# Patient Record
Sex: Female | Born: 1940
Health system: Southern US, Community
[De-identification: ages and names within clinical notes are randomized; demographics above are authoritative.]

## PROBLEM LIST (undated history)

## (undated) DIAGNOSIS — I493 Ventricular premature depolarization: Secondary | ICD-10-CM

## (undated) DIAGNOSIS — K219 Gastro-esophageal reflux disease without esophagitis: Secondary | ICD-10-CM

## (undated) DIAGNOSIS — R0789 Other chest pain: Secondary | ICD-10-CM

## (undated) DIAGNOSIS — T8859XA Other complications of anesthesia, initial encounter: Secondary | ICD-10-CM

## (undated) DIAGNOSIS — R55 Syncope and collapse: Secondary | ICD-10-CM

## (undated) DIAGNOSIS — R7303 Prediabetes: Secondary | ICD-10-CM

## (undated) DIAGNOSIS — E559 Vitamin D deficiency, unspecified: Secondary | ICD-10-CM

## (undated) DIAGNOSIS — Z7982 Long term (current) use of aspirin: Secondary | ICD-10-CM

## (undated) DIAGNOSIS — I700 Atherosclerosis of aorta: Secondary | ICD-10-CM

## (undated) DIAGNOSIS — I10 Essential (primary) hypertension: Secondary | ICD-10-CM

## (undated) DIAGNOSIS — E785 Hyperlipidemia, unspecified: Secondary | ICD-10-CM

## (undated) DIAGNOSIS — G4700 Insomnia, unspecified: Secondary | ICD-10-CM

## (undated) DIAGNOSIS — E039 Hypothyroidism, unspecified: Secondary | ICD-10-CM

## (undated) DIAGNOSIS — R300 Dysuria: Secondary | ICD-10-CM

## (undated) DIAGNOSIS — M1990 Unspecified osteoarthritis, unspecified site: Secondary | ICD-10-CM

## (undated) DIAGNOSIS — G629 Polyneuropathy, unspecified: Secondary | ICD-10-CM

## (undated) DIAGNOSIS — I7300 Raynaud's syndrome without gangrene: Secondary | ICD-10-CM

## (undated) DIAGNOSIS — M170 Bilateral primary osteoarthritis of knee: Secondary | ICD-10-CM

## (undated) HISTORY — DX: Ventricular premature depolarization: I49.3

## (undated) HISTORY — DX: Hypothyroidism, unspecified: E03.9

## (undated) HISTORY — DX: Dysuria: R30.0

## (undated) HISTORY — PX: ABDOMINAL HYSTERECTOMY: SHX81

## (undated) HISTORY — PX: CATARACT EXTRACTION, BILATERAL: SHX1313

## (undated) HISTORY — DX: Hyperlipidemia, unspecified: E78.5

---

## 1998-05-19 ENCOUNTER — Ambulatory Visit (HOSPITAL_COMMUNITY): Admission: RE | Admit: 1998-05-19 | Discharge: 1998-05-19 | Payer: Self-pay | Admitting: Neurology

## 2005-01-12 ENCOUNTER — Ambulatory Visit: Payer: Self-pay | Admitting: Family Medicine

## 2005-02-01 ENCOUNTER — Ambulatory Visit: Payer: Self-pay | Admitting: Family Medicine

## 2005-08-27 ENCOUNTER — Ambulatory Visit: Payer: Self-pay | Admitting: Family Medicine

## 2006-06-03 ENCOUNTER — Ambulatory Visit: Payer: Self-pay | Admitting: Family Medicine

## 2006-06-28 ENCOUNTER — Ambulatory Visit: Payer: Self-pay | Admitting: General Surgery

## 2007-10-17 ENCOUNTER — Ambulatory Visit: Payer: Self-pay | Admitting: Family Medicine

## 2008-10-21 ENCOUNTER — Ambulatory Visit: Payer: Self-pay | Admitting: Family Medicine

## 2009-10-27 ENCOUNTER — Ambulatory Visit: Payer: Self-pay | Admitting: Internal Medicine

## 2009-11-25 ENCOUNTER — Ambulatory Visit: Payer: Self-pay | Admitting: Internal Medicine

## 2009-12-25 ENCOUNTER — Other Ambulatory Visit: Payer: Self-pay | Admitting: Internal Medicine

## 2009-12-30 ENCOUNTER — Ambulatory Visit: Payer: Self-pay | Admitting: Internal Medicine

## 2010-03-16 ENCOUNTER — Ambulatory Visit: Payer: Self-pay | Admitting: Gastroenterology

## 2010-10-28 ENCOUNTER — Ambulatory Visit: Payer: Self-pay | Admitting: Internal Medicine

## 2010-10-28 LAB — HM DEXA SCAN: HM Dexa Scan: NORMAL

## 2011-04-13 LAB — HM COLONOSCOPY: HM Colonoscopy: NORMAL

## 2011-07-27 ENCOUNTER — Ambulatory Visit: Payer: Self-pay

## 2011-10-23 LAB — HM MAMMOGRAPHY: HM Mammogram: NORMAL

## 2011-11-03 ENCOUNTER — Ambulatory Visit: Payer: Self-pay | Admitting: Internal Medicine

## 2011-11-05 ENCOUNTER — Encounter: Payer: Self-pay | Admitting: Internal Medicine

## 2011-11-09 ENCOUNTER — Encounter: Payer: Self-pay | Admitting: Internal Medicine

## 2011-11-10 ENCOUNTER — Telehealth: Payer: Self-pay | Admitting: *Deleted

## 2011-11-10 NOTE — Telephone Encounter (Signed)
OK to fill

## 2011-11-10 NOTE — Telephone Encounter (Signed)
Pharm faxed RF request -  VIT D 50,000 IU once a month. Next OV is 12/26

## 2011-11-11 MED ORDER — ERGOCALCIFEROL 1.25 MG (50000 UT) PO CAPS: 50000.0000 [IU] | ORAL_CAPSULE | ORAL | Status: DC

## 2011-11-17 ENCOUNTER — Encounter: Payer: Self-pay | Admitting: Internal Medicine

## 2011-11-17 ENCOUNTER — Ambulatory Visit (INDEPENDENT_AMBULATORY_CARE_PROVIDER_SITE_OTHER): Payer: Medicare Other | Admitting: Internal Medicine

## 2011-11-17 DIAGNOSIS — Z0000 Encounter for general adult medical examination without abnormal findings: Secondary | ICD-10-CM

## 2011-11-17 DIAGNOSIS — R040 Epistaxis: Secondary | ICD-10-CM

## 2011-11-17 DIAGNOSIS — E7849 Other hyperlipidemia: Secondary | ICD-10-CM | POA: Insufficient documentation

## 2011-11-17 DIAGNOSIS — E039 Hypothyroidism, unspecified: Secondary | ICD-10-CM

## 2011-11-17 DIAGNOSIS — R002 Palpitations: Secondary | ICD-10-CM

## 2011-11-17 DIAGNOSIS — E785 Hyperlipidemia, unspecified: Secondary | ICD-10-CM

## 2011-11-17 NOTE — Progress Notes (Signed)
Subjective:     Patient ID: [name], female   DOB: 1941-02-25, 70 y.o.   MRN: [id number]  HPI The patient is here for annual Medicare wellness examination and management of other chronic and acute problems.   The risk factors are reflected in the social history.  The roster of all physicians providing medical care to patient - is listed in the Snapshot section of the chart.  Activities of daily living:  The patient is 100% independent in all ADLs: dressing, toileting, feeding as well as independent mobility  Home safety : The patient has smoke detectors in the home. They wear seatbelts. No firearms at home. There is no violence in the home.   There is no risks for hepatitis, STDs or HIV. There is no history of blood transfusion. They have no travel history to infectious disease endemic areas of the world.  The patient has seen their dentist in the last six month. They have seen their eye doctor in the last year. They deny any hearing difficulty and have not had audiologic testing in the last year.  They do not  have excessive sun exposure. Discussed the need for sun protection: hats, long sleeves and use of sunscreen if there is significant sun exposure.   Diet: the importance of a healthy diet is discussed. They do have a healthy diet.  The patient has a regular exercise program: Aerobic classes at [employer] , at least an hour duration, _5_per week.  The benefits of regular aerobic exercise were discussed.  Depression screen: there are no signs or vegative symptoms of depression- irritability, change in appetite, anhedonia, sadness/tearfullness.  Cognitive assessment: the patient manages all their financial and personal affairs and is actively engaged. They could relate day,date,year and events; recalled 3/3 objects at 3 minutes; performed clock-face test normally.  The following portions of the patient's history were reviewed and updated as appropriate: allergies, current medications,  past family history, past medical history,  past surgical history, past social history  and problem list.  Vision, hearing, body mass index were assessed and reviewed.   During the course of the visit the patient was educated and counseled about appropriate screening and preventive services including : fall prevention , diabetes screening, nutrition counseling, colorectal cancer screening, and recommended immunizations.   Patient is also concerned today about recent episodes of palpitations. She describes these as short rapid heartbeats which occur most often at night. The episodes of rapid heartbeat typically last for less than a minute. They are not associated with chest pain, diaphoresis, shortness of breath, nausea, or other symptoms. She has been evaluated for palpitations in the distant past with a Holter monitor which she reports was normal. This was performed approximately 30 years ago. She has not taken any medication for these palpitations. She does note recent increase in anxiety and stress level with caring for her husband who had a stroke and her sister who was recently hospitalized after surgical procedure.  She also notes recent episodes of nosebleeds. She reports that these occurred on approximately 4 occasions. She was unable to stop the bleeding by applying pressure and ultimately went to an ENT physician to have the bleeding cauterized. She reports no further bleeding after cauterization.  Review of Systems  Constitutional: Negative for fever, chills, appetite change, fatigue and unexpected weight change.  HENT: Negative for ear pain, congestion, sore throat, trouble swallowing, neck pain, voice change and sinus pressure.   Eyes: Negative for visual disturbance.  Respiratory: Negative for  cough, shortness of breath, wheezing and stridor.   Cardiovascular: Positive for palpitations. Negative for chest pain and leg swelling.  Gastrointestinal: Negative for nausea, vomiting, abdominal  pain, diarrhea, constipation, blood in stool, abdominal distention and anal bleeding.  Genitourinary: Negative for dysuria and flank pain.  Musculoskeletal: Negative for myalgias, arthralgias and gait problem.  Skin: Negative for color change and rash.  Neurological: Negative for dizziness and headaches.  Hematological: Negative for adenopathy. Does not bruise/bleed easily.  Psychiatric/Behavioral: Negative for suicidal ideas, sleep disturbance and dysphoric mood. The patient is not nervous/anxious.    BP 118/70  Pulse 60  Temp(Src) 97.9 F (36.6 C) (Oral)  Ht 5\' 8"  (1.727 m)  Wt 146 lb (66.225 kg)  BMI 22.20 kg/m2  SpO2 97%      Objective:   Physical Exam  Constitutional: She is oriented to person, place, and time. She appears well-developed and well-nourished. No distress.  HENT:  Head: Normocephalic and atraumatic.  Right Ear: External ear normal.  Left Ear: External ear normal.  Nose: Nose normal.  Mouth/Throat: Oropharynx is clear and moist. No oropharyngeal exudate.  Eyes: Conjunctivae are normal. Pupils are equal, round, and reactive to light. Right eye exhibits no discharge. Left eye exhibits no discharge. No scleral icterus.  Neck: Normal range of motion. Neck supple. No tracheal deviation present. No thyromegaly present.  Cardiovascular: Normal rate, regular rhythm, normal heart sounds and intact distal pulses.  Exam reveals no gallop and no friction rub.   No murmur heard. Pulmonary/Chest: Effort normal and breath sounds normal. No respiratory distress. She has no wheezes. She has no rales. She exhibits no tenderness.  Abdominal: Soft. Bowel sounds are normal. She exhibits no distension and no mass. There is no tenderness. There is no rebound and no guarding.  Musculoskeletal: Normal range of motion. She exhibits no edema and no tenderness.  Lymphadenopathy:    She has no cervical adenopathy.  Neurological: She is alert and oriented to person, place, and time. No  cranial nerve deficit. She exhibits normal muscle tone. Coordination normal.  Skin: Skin is warm and dry. No rash noted. She is not diaphoretic. No erythema. No pallor.  Psychiatric: She has a normal mood and affect. Her behavior is normal. Judgment and thought content normal.       Assessment/Plan:  1. General medical exam - Health maintenance is UTD including mammogram, colonoscopy, and vaccinations. Will check basic labs including CBC, CMP, lipids.  2. Palpitations - Exam is normal today. EKG is normal. Suspect these episodes are related to recent increase in anxiety, however will check electrolytes and TSH with labs. Will also set up for repeat cardiology evaluation with holter monitor.  3. Epistaxis - Resolved after cauterization. Will check CBC with labs today.  4. Hyperlipidemia - Will check lipids and LFTs with labs.

## 2011-11-24 ENCOUNTER — Other Ambulatory Visit (INDEPENDENT_AMBULATORY_CARE_PROVIDER_SITE_OTHER): Payer: Medicare Other | Admitting: *Deleted

## 2011-11-24 DIAGNOSIS — E785 Hyperlipidemia, unspecified: Secondary | ICD-10-CM

## 2011-11-24 DIAGNOSIS — E039 Hypothyroidism, unspecified: Secondary | ICD-10-CM

## 2011-11-24 DIAGNOSIS — R040 Epistaxis: Secondary | ICD-10-CM

## 2011-11-24 LAB — CBC WITH DIFFERENTIAL/PLATELET
Basophils Absolute: 0 10*3/uL (ref 0.0–0.1)
Basophils Relative: 0.8 % (ref 0.0–3.0)
Eosinophils Absolute: 0.4 10*3/uL (ref 0.0–0.7)
Eosinophils Relative: 8.4 % — ABNORMAL HIGH (ref 0.0–5.0)
HCT: 40.9 % (ref 36.0–46.0)
Hemoglobin: 13.7 g/dL (ref 12.0–15.0)
Lymphocytes Relative: 25.5 % (ref 12.0–46.0)
Lymphs Abs: 1.1 10*3/uL (ref 0.7–4.0)
MCHC: 33.5 g/dL (ref 30.0–36.0)
MCV: 92.9 fl (ref 78.0–100.0)
Monocytes Absolute: 0.4 10*3/uL (ref 0.1–1.0)
Monocytes Relative: 8.1 % (ref 3.0–12.0)
Neutro Abs: 2.5 10*3/uL (ref 1.4–7.7)
Neutrophils Relative %: 57.2 % (ref 43.0–77.0)
Platelets: 234 10*3/uL (ref 150.0–400.0)
RBC: 4.4 Mil/uL (ref 3.87–5.11)
RDW: 13.2 % (ref 11.5–14.6)
WBC: 4.3 10*3/uL — ABNORMAL LOW (ref 4.5–10.5)

## 2011-11-24 LAB — COMPREHENSIVE METABOLIC PANEL
ALT: 23 U/L (ref 0–35)
AST: 31 U/L (ref 0–37)
Albumin: 4.2 g/dL (ref 3.5–5.2)
Alkaline Phosphatase: 43 U/L (ref 39–117)
BUN: 10 mg/dL (ref 6–23)
CO2: 30 mEq/L (ref 19–32)
Calcium: 9.3 mg/dL (ref 8.4–10.5)
Chloride: 108 mEq/L (ref 96–112)
Creatinine, Ser: 0.8 mg/dL (ref 0.4–1.2)
GFR: 81.08 mL/min (ref >60.00)
Glucose, Bld: 82 mg/dL (ref 70–99)
Potassium: 4.3 mEq/L (ref 3.5–5.1)
Sodium: 143 mEq/L (ref 135–145)
Total Bilirubin: 0.5 mg/dL (ref 0.3–1.2)
Total Protein: 7.5 g/dL (ref 6.0–8.3)

## 2011-11-24 LAB — LIPID PANEL
Cholesterol: 181 mg/dL (ref 0–200)
HDL: 56.3 mg/dL (ref >39.00)
LDL Cholesterol: 102 mg/dL — ABNORMAL HIGH (ref 0–99)
Total CHOL/HDL Ratio: 3
Triglycerides: 116 mg/dL (ref 0.0–149.0)
VLDL: 23.2 mg/dL (ref 0.0–40.0)

## 2011-11-24 LAB — TSH: TSH: 2.4 u[IU]/mL (ref 0.35–5.50)

## 2011-11-25 ENCOUNTER — Other Ambulatory Visit: Payer: Self-pay | Admitting: *Deleted

## 2011-11-25 MED ORDER — ERGOCALCIFEROL 1.25 MG (50000 UT) PO CAPS: 50000.0000 [IU] | ORAL_CAPSULE | ORAL | Status: AC

## 2011-12-06 ENCOUNTER — Encounter: Payer: Self-pay | Admitting: Internal Medicine

## 2011-12-06 ENCOUNTER — Ambulatory Visit (INDEPENDENT_AMBULATORY_CARE_PROVIDER_SITE_OTHER): Payer: Medicare Other | Admitting: Internal Medicine

## 2011-12-06 VITALS — BP 138/70 | HR 64 | Temp 98.2°F | Wt 144.0 lb

## 2011-12-06 DIAGNOSIS — D72819 Decreased white blood cell count, unspecified: Secondary | ICD-10-CM

## 2011-12-06 LAB — CBC WITH DIFFERENTIAL/PLATELET
Basophils Absolute: 0.1 10*3/uL (ref 0.0–0.1)
Basophils Relative: 1.2 % (ref 0.0–3.0)
Eosinophils Absolute: 0.2 10*3/uL (ref 0.0–0.7)
Eosinophils Relative: 5.6 % — ABNORMAL HIGH (ref 0.0–5.0)
HCT: 37.6 % (ref 36.0–46.0)
Hemoglobin: 12.7 g/dL (ref 12.0–15.0)
Lymphocytes Relative: 30.3 % (ref 12.0–46.0)
Lymphs Abs: 1.2 10*3/uL (ref 0.7–4.0)
MCHC: 33.7 g/dL (ref 30.0–36.0)
MCV: 92.2 fl (ref 78.0–100.0)
Monocytes Absolute: 0.4 10*3/uL (ref 0.1–1.0)
Monocytes Relative: 10.8 % (ref 3.0–12.0)
Neutro Abs: 2.1 10*3/uL (ref 1.4–7.7)
Neutrophils Relative %: 52.1 % (ref 43.0–77.0)
Platelets: 212 10*3/uL (ref 150.0–400.0)
RBC: 4.08 Mil/uL (ref 3.87–5.11)
RDW: 13.2 % (ref 11.5–14.6)
WBC: 4.1 10*3/uL — ABNORMAL LOW (ref 4.5–10.5)

## 2011-12-06 LAB — VITAMIN B12: Vitamin B-12: 905 pg/mL (ref 211–911)

## 2011-12-06 NOTE — Progress Notes (Signed)
Subjective:    Patient ID: [name], female    DOB: Oct 06, 1941, 71 y.o.   MRN: [id number]  HPI 71 year old female presents for followup after recent episode of palpitations. She notes that her palpitations resolved without any intervention. She has not had any palpitations since her last visit. She has not had any chest pain, diaphoresis, shortness of breath, or other complaints. She reports that she's been feeling well. She continues to have some increased anxiety associated with caring for her husband who recently had a stroke.  Outpatient Encounter Prescriptions as of 12/06/2011  Medication Sig Dispense Refill  . aspirin EC 81 MG tablet Take 81 mg by mouth daily.        . Calcium Carbonate-Vit D-Min (CALCIUM 1200 PO) Take by mouth daily.        . ergocalciferol (VITAMIN D2) 50000 UNITS capsule Take 1 capsule (50,000 Units total) by mouth every 30 (thirty) days.  3 capsule  1  . fish oil-omega-3 fatty acids 1000 MG capsule Take 1 g by mouth daily.        . Glucosamine HCl 1000 MG TABS Take by mouth daily.        Marland Kitchen levothyroxine (SYNTHROID, LEVOTHROID) 50 MCG tablet Take 50 mcg by mouth daily.        . Multiple Vitamins-Minerals (MULTIVITAMIN WITH MINERALS) tablet Take 1 tablet by mouth daily.        . niacin (NIASPAN) 1000 MG CR tablet Take 1,000 mg by mouth at bedtime.        . simvastatin (ZOCOR) 20 MG tablet Take 20 mg by mouth at bedtime.          Review of Systems  Constitutional: Negative for fever, chills, appetite change, fatigue and unexpected weight change.  HENT: Negative for ear pain, congestion, sore throat, trouble swallowing, neck pain, voice change and sinus pressure.   Eyes: Negative for visual disturbance.  Respiratory: Negative for cough, shortness of breath, wheezing and stridor.   Cardiovascular: Negative for chest pain, palpitations and leg swelling.  Gastrointestinal: Negative for nausea, vomiting, abdominal pain, diarrhea, constipation, blood in stool,  abdominal distention and anal bleeding.  Genitourinary: Negative for dysuria and flank pain.  Musculoskeletal: Negative for myalgias, arthralgias and gait problem.  Skin: Negative for color change and rash.  Neurological: Negative for dizziness and headaches.  Hematological: Negative for adenopathy. Does not bruise/bleed easily.  Psychiatric/Behavioral: Negative for suicidal ideas, sleep disturbance and dysphoric mood. The patient is not nervous/anxious.    BP 138/70  Pulse 64  Temp(Src) 98.2 F (36.8 C) (Oral)  Wt 144 lb (65.318 kg)  SpO2 96%     Objective:   Physical Exam  Constitutional: She is oriented to person, place, and time. She appears well-developed and well-nourished. No distress.  HENT:  Head: Normocephalic and atraumatic.  Right Ear: External ear normal.  Left Ear: External ear normal.  Nose: Nose normal.  Mouth/Throat: Oropharynx is clear and moist. No oropharyngeal exudate.  Eyes: Conjunctivae are normal. Pupils are equal, round, and reactive to light. Right eye exhibits no discharge. Left eye exhibits no discharge. No scleral icterus.  Neck: Normal range of motion. Neck supple. No tracheal deviation present. No thyromegaly present.  Cardiovascular: Normal rate, regular rhythm, normal heart sounds and intact distal pulses.  Exam reveals no gallop and no friction rub.   No murmur heard. Pulmonary/Chest: Effort normal and breath sounds normal. No respiratory distress. She has no wheezes. She has no rales. She exhibits no tenderness.  Musculoskeletal: Normal range of motion. She exhibits no edema and no tenderness.  Lymphadenopathy:    She has no cervical adenopathy.  Neurological: She is alert and oriented to person, place, and time. No cranial nerve deficit. She exhibits normal muscle tone. Coordination normal.  Skin: Skin is warm and dry. No rash noted. She is not diaphoretic. No erythema. No pallor.  Psychiatric: She has a normal mood and affect. Her behavior is  normal. Judgment and thought content normal.          Assessment & Plan:  1. Palpitations - Resolved without intervention.  Suspect related to increased anxiety recently with husband's illness. Will continue to monitor.  2. Leukopenia - Mild WBC 4.7 with eosinophilia. Asymptomatic. Suspect related to previous viral infection. Will repeat CBC today.

## 2011-12-08 LAB — HM COLONOSCOPY: HM Colonoscopy: NORMAL

## 2011-12-28 ENCOUNTER — Other Ambulatory Visit: Payer: Medicare Other

## 2011-12-29 ENCOUNTER — Telehealth: Payer: Self-pay | Admitting: *Deleted

## 2011-12-29 ENCOUNTER — Other Ambulatory Visit: Payer: Self-pay | Admitting: *Deleted

## 2011-12-29 MED ORDER — SIMVASTATIN 20 MG PO TABS: 20.0000 mg | ORAL_TABLET | Freq: Every day | ORAL | Status: DC

## 2011-12-29 NOTE — Telephone Encounter (Signed)
See result note, unable to contact. Letter mailed

## 2011-12-29 NOTE — Telephone Encounter (Signed)
Message copied by Vernie Murders on Wed Dec 29, 2011  3:19 PM ------      Message from: Ronna Polio A      Created: Mon Dec 06, 2011  3:38 PM       Labs show that WBC continues to be low at 4.1.  I still think this may be secondary to viral infection.  I would like to just repeat CBC in 3 months.

## 2012-02-01 ENCOUNTER — Encounter: Payer: Self-pay | Admitting: Internal Medicine

## 2012-03-01 ENCOUNTER — Other Ambulatory Visit (INDEPENDENT_AMBULATORY_CARE_PROVIDER_SITE_OTHER): Payer: Medicare Other | Admitting: *Deleted

## 2012-03-01 DIAGNOSIS — D72819 Decreased white blood cell count, unspecified: Secondary | ICD-10-CM

## 2012-03-02 LAB — CBC WITH DIFFERENTIAL/PLATELET
Basophils Absolute: 0 10*3/uL (ref 0.0–0.1)
Basophils Relative: 0.4 % (ref 0.0–3.0)
Eosinophils Absolute: 0.3 10*3/uL (ref 0.0–0.7)
Eosinophils Relative: 4.3 % (ref 0.0–5.0)
HCT: 40 % (ref 36.0–46.0)
Hemoglobin: 12.9 g/dL (ref 12.0–15.0)
Lymphocytes Relative: 18.8 % (ref 12.0–46.0)
Lymphs Abs: 1.1 10*3/uL (ref 0.7–4.0)
MCHC: 32.3 g/dL (ref 30.0–36.0)
MCV: 96.6 fl (ref 78.0–100.0)
Monocytes Absolute: 0.5 10*3/uL (ref 0.1–1.0)
Monocytes Relative: 7.5 % (ref 3.0–12.0)
Neutro Abs: 4.2 10*3/uL (ref 1.4–7.7)
Neutrophils Relative %: 69 % (ref 43.0–77.0)
Platelets: 128 10*3/uL — ABNORMAL LOW (ref 150.0–400.0)
RBC: 4.14 Mil/uL (ref 3.87–5.11)
RDW: 13.4 % (ref 11.5–14.6)
WBC: 6.1 10*3/uL (ref 4.5–10.5)

## 2012-03-03 ENCOUNTER — Other Ambulatory Visit: Payer: Self-pay | Admitting: Internal Medicine

## 2012-03-03 MED ORDER — LEVOTHYROXINE SODIUM 50 MCG PO TABS: 50.0000 ug | ORAL_TABLET | Freq: Every day | ORAL | Status: DC

## 2012-03-06 ENCOUNTER — Other Ambulatory Visit (INDEPENDENT_AMBULATORY_CARE_PROVIDER_SITE_OTHER): Payer: Medicare Other | Admitting: *Deleted

## 2012-03-06 DIAGNOSIS — D729 Disorder of white blood cells, unspecified: Secondary | ICD-10-CM

## 2012-03-06 LAB — CBC WITH DIFFERENTIAL/PLATELET
Basophils Absolute: 0 10*3/uL (ref 0.0–0.1)
Basophils Relative: 0.9 % (ref 0.0–3.0)
Eosinophils Absolute: 0.2 10*3/uL (ref 0.0–0.7)
Eosinophils Relative: 4.7 % (ref 0.0–5.0)
HCT: 37.1 % (ref 36.0–46.0)
Hemoglobin: 12.3 g/dL (ref 12.0–15.0)
Lymphocytes Relative: 25.5 % (ref 12.0–46.0)
Lymphs Abs: 1.2 10*3/uL (ref 0.7–4.0)
MCHC: 33.2 g/dL (ref 30.0–36.0)
MCV: 94.6 fl (ref 78.0–100.0)
Monocytes Absolute: 0.4 10*3/uL (ref 0.1–1.0)
Monocytes Relative: 7.9 % (ref 3.0–12.0)
Neutro Abs: 2.8 10*3/uL (ref 1.4–7.7)
Neutrophils Relative %: 61 % (ref 43.0–77.0)
Platelets: 206 10*3/uL (ref 150.0–400.0)
RBC: 3.93 Mil/uL (ref 3.87–5.11)
RDW: 12.9 % (ref 11.5–14.6)
WBC: 4.5 10*3/uL (ref 4.5–10.5)

## 2012-03-13 ENCOUNTER — Other Ambulatory Visit: Payer: Self-pay | Admitting: *Deleted

## 2012-03-13 MED ORDER — NIACIN ER (ANTIHYPERLIPIDEMIC) 1000 MG PO TBCR: EXTENDED_RELEASE_TABLET | ORAL | Status: DC

## 2012-03-20 ENCOUNTER — Other Ambulatory Visit: Payer: Self-pay | Admitting: *Deleted

## 2012-03-20 MED ORDER — NIACIN ER 500 MG PO CPCR: 1000.0000 mg | ORAL_CAPSULE | Freq: Every day | ORAL | Status: DC

## 2012-03-20 MED ORDER — SIMVASTATIN 20 MG PO TABS: 20.0000 mg | ORAL_TABLET | Freq: Every day | ORAL | Status: DC

## 2012-03-20 NOTE — Telephone Encounter (Signed)
Per patient request/SLS 

## 2012-05-22 ENCOUNTER — Other Ambulatory Visit: Payer: Self-pay | Admitting: Internal Medicine

## 2012-05-30 ENCOUNTER — Telehealth: Payer: Self-pay | Admitting: Internal Medicine

## 2012-05-30 NOTE — Telephone Encounter (Signed)
Fine to wait until Dec

## 2012-05-30 NOTE — Telephone Encounter (Signed)
Left message on machine at home advising patient as instructed. 

## 2012-05-30 NOTE — Telephone Encounter (Signed)
Patient has to cancel next week's appt since she has to take her husband to the doctor (he has cancer). Patient said she is doing well & wants to know if she needs to make an OV now or can she save the money and schedule a CPE for December. She will be home this afternoon for CB.

## 2012-06-06 ENCOUNTER — Ambulatory Visit: Payer: Medicare Other | Admitting: Internal Medicine

## 2012-06-08 ENCOUNTER — Other Ambulatory Visit: Payer: Self-pay | Admitting: Internal Medicine

## 2012-06-09 NOTE — Telephone Encounter (Signed)
Need to recheck Vit D level before refilling Rx.

## 2012-08-04 ENCOUNTER — Encounter: Payer: Self-pay | Admitting: Internal Medicine

## 2012-08-04 ENCOUNTER — Ambulatory Visit (INDEPENDENT_AMBULATORY_CARE_PROVIDER_SITE_OTHER): Payer: Medicare Other | Admitting: Internal Medicine

## 2012-08-04 VITALS — BP 122/70 | HR 74 | Temp 98.5°F | Ht 68.0 in | Wt 148.0 lb

## 2012-08-04 DIAGNOSIS — Z23 Encounter for immunization: Secondary | ICD-10-CM

## 2012-08-04 DIAGNOSIS — N8110 Cystocele, unspecified: Secondary | ICD-10-CM

## 2012-08-04 DIAGNOSIS — E559 Vitamin D deficiency, unspecified: Secondary | ICD-10-CM

## 2012-08-04 LAB — COMPREHENSIVE METABOLIC PANEL
ALT: 30 U/L (ref 0–35)
AST: 33 U/L (ref 0–37)
Albumin: 4 g/dL (ref 3.5–5.2)
Alkaline Phosphatase: 37 U/L — ABNORMAL LOW (ref 39–117)
BUN: 13 mg/dL (ref 6–23)
CO2: 32 mEq/L (ref 19–32)
Calcium: 9.7 mg/dL (ref 8.4–10.5)
Chloride: 104 mEq/L (ref 96–112)
Creatinine, Ser: 0.7 mg/dL (ref 0.4–1.2)
GFR: 89.09 mL/min (ref >60.00)
Glucose, Bld: 110 mg/dL — ABNORMAL HIGH (ref 70–99)
Potassium: 4.5 mEq/L (ref 3.5–5.1)
Sodium: 141 mEq/L (ref 135–145)
Total Bilirubin: 0.5 mg/dL (ref 0.3–1.2)
Total Protein: 7 g/dL (ref 6.0–8.3)

## 2012-08-04 NOTE — Assessment & Plan Note (Signed)
Symptoms and exam consistent with vaginal prolapse. Will set up evaluation with GYN.

## 2012-08-04 NOTE — Progress Notes (Signed)
Subjective:    Patient ID: [name], female    DOB: 1941/08/28, 71 y.o.   MRN: [id number]  HPI 71 year old female presents for acute visit complaining of possible vaginal prolapse. She reports that over the last several months she has noticed when she is standing that her vagina or bladder protrude externally. She is able to push this back into place. She does not have difficulty urinating or having a bowel movement. She has not had any burning with urination, fever, chills. She denies any pain in her pelvic area. She denies vaginal discharge or bleeding. She did have a hysterectomy in her 30s. She has not recently been seen by GYN.  She is also curious whether she needs to continue taking vitamin D. She requests repeat vitamin D level today.  Outpatient Encounter Prescriptions as of 08/04/2012  Medication Sig Dispense Refill  . aspirin EC 81 MG tablet Take 81 mg by mouth daily.        . Calcium Carbonate-Vit D-Min (CALCIUM 1200 PO) Take by mouth daily.        . [facility] NIACIN FLUSH FREE 400-100 MG CAPS TAKE 2 TABLETS BY MOUTH EVERY DAY  60 capsule  3  . ergocalciferol (VITAMIN D2) 50000 UNITS capsule Take 1 capsule (50,000 Units total) by mouth every 30 (thirty) days.  3 capsule  1  . fish oil-omega-3 fatty acids 1000 MG capsule Take 1 g by mouth daily.        . Glucosamine HCl 1000 MG TABS Take by mouth daily.        Marland Kitchen levothyroxine (SYNTHROID, LEVOTHROID) 50 MCG tablet Take 1 tablet (50 mcg total) by mouth daily.  90 tablet  3  . Multiple Vitamins-Minerals (MULTIVITAMIN WITH MINERALS) tablet Take 1 tablet by mouth daily.        . niacin 500 MG CR capsule Take 2 capsules (1,000 mg total) by mouth at bedtime.  180 capsule  2  . simvastatin (ZOCOR) 20 MG tablet Take 1 tablet (20 mg total) by mouth at bedtime.  90 tablet  1   BP 122/70  Pulse 74  Temp 98.5 F (36.9 C) (Oral)  Ht 5\' 8"  (1.727 m)  Wt 148 lb (67.132 kg)  BMI 22.50 kg/m2  SpO2 97%  Review of Systems  Constitutional:  Negative for fever, chills, appetite change, fatigue and unexpected weight change.  HENT: Negative for ear pain, congestion, sore throat, trouble swallowing, neck pain, voice change and sinus pressure.   Eyes: Negative for visual disturbance.  Respiratory: Negative for cough, shortness of breath, wheezing and stridor.   Cardiovascular: Negative for chest pain, palpitations and leg swelling.  Gastrointestinal: Negative for nausea, vomiting, abdominal pain, diarrhea, constipation, blood in stool, abdominal distention and anal bleeding.  Genitourinary: Negative for dysuria, urgency, hematuria, flank pain, decreased urine volume, vaginal bleeding, vaginal discharge, genital sores, vaginal pain and pelvic pain.  Musculoskeletal: Negative for myalgias, arthralgias and gait problem.  Skin: Negative for color change and rash.  Neurological: Negative for dizziness and headaches.  Hematological: Negative for adenopathy. Does not bruise/bleed easily.  Psychiatric/Behavioral: Negative for suicidal ideas, disturbed wake/sleep cycle and dysphoric mood. The patient is not nervous/anxious.        Objective:   Physical Exam  Constitutional: She is oriented to person, place, and time. She appears well-developed and well-nourished. No distress.  HENT:  Head: Normocephalic and atraumatic.  Right Ear: External ear normal.  Left Ear: External ear normal.  Nose: Nose normal.  Mouth/Throat: Oropharynx is  clear and moist. No oropharyngeal exudate.  Eyes: Conjunctivae normal are normal. Pupils are equal, round, and reactive to light.  Neck: Normal range of motion. Neck supple.  Pulmonary/Chest: Effort normal.  Genitourinary:    No erythema, tenderness or bleeding around the vagina. No foreign body around the vagina. No signs of injury around the vagina. No vaginal discharge found.  Musculoskeletal: Normal range of motion. She exhibits no edema and no tenderness.  Neurological: She is alert and oriented to  person, place, and time. No cranial nerve deficit. She exhibits normal muscle tone. Coordination normal.  Skin: Skin is warm and dry. No rash noted. She is not diaphoretic. No erythema. No pallor.  Psychiatric: She has a normal mood and affect. Her behavior is normal. Judgment and thought content normal.          Assessment & Plan:

## 2012-08-04 NOTE — Assessment & Plan Note (Signed)
Will recheck Vit D level with labs today. 

## 2012-08-05 LAB — VITAMIN D 25 HYDROXY (VIT D DEFICIENCY, FRACTURES): Vit D, 25-Hydroxy: 33 ng/mL (ref 30–89)

## 2012-08-07 ENCOUNTER — Telehealth: Payer: Self-pay

## 2012-08-07 NOTE — Telephone Encounter (Signed)
Patient called wants to know that since her labs are ok do she need to take the Vitamin D, if so she would like for that to be [facility] [place].

## 2012-08-07 NOTE — Telephone Encounter (Signed)
The vit D was normal, but low normal. I would recommend that she take Vit D 2000units daily which is available over the counter.

## 2012-08-08 NOTE — Telephone Encounter (Signed)
Patient notified

## 2012-10-06 ENCOUNTER — Ambulatory Visit: Payer: Self-pay | Admitting: Internal Medicine

## 2012-10-06 ENCOUNTER — Telehealth: Payer: Self-pay | Admitting: Internal Medicine

## 2012-10-06 ENCOUNTER — Emergency Department: Payer: Self-pay | Admitting: Emergency Medicine

## 2012-10-06 LAB — URINALYSIS, COMPLETE
Bacteria: NONE SEEN
Bilirubin,UR: NEGATIVE
Blood: NEGATIVE
Glucose,UR: NEGATIVE mg/dL (ref 0–75)
Ketone: NEGATIVE
Leukocyte Esterase: NEGATIVE
Nitrite: NEGATIVE
Ph: 9 (ref 4.5–8.0)
Protein: NEGATIVE
RBC,UR: <1 /HPF (ref 0–5)
Specific Gravity: 1.004 (ref 1.003–1.030)
Squamous Epithelial: <1
WBC UR: <1 /HPF (ref 0–5)

## 2012-10-06 LAB — CBC
HCT: 38.3 % (ref 35.0–47.0)
HGB: 13.1 g/dL (ref 12.0–16.0)
MCH: 32.3 pg (ref 26.0–34.0)
MCHC: 34.2 g/dL (ref 32.0–36.0)
MCV: 95 fL (ref 80–100)
Platelet: 201 10*3/uL (ref 150–440)
RBC: 4.05 10*6/uL (ref 3.80–5.20)
RDW: 12.4 % (ref 11.5–14.5)
WBC: 5.5 10*3/uL (ref 3.6–11.0)

## 2012-10-06 LAB — BASIC METABOLIC PANEL
Anion Gap: 6 — ABNORMAL LOW (ref 7–16)
BUN: 12 mg/dL (ref 7–18)
Calcium, Total: 9.3 mg/dL (ref 8.5–10.1)
Chloride: 105 mmol/L (ref 98–107)
Co2: 28 mmol/L (ref 21–32)
Creatinine: 0.78 mg/dL (ref 0.60–1.30)
EGFR (African American): >60
EGFR (Non-African Amer.): >60
Glucose: 117 mg/dL — ABNORMAL HIGH (ref 65–99)
Osmolality: 278 (ref 275–301)
Potassium: 3.9 mmol/L (ref 3.5–5.1)
Sodium: 139 mmol/L (ref 136–145)

## 2012-10-06 LAB — TROPONIN I: Troponin-I: <0.02 ng/mL

## 2012-10-06 NOTE — Telephone Encounter (Signed)
Caller: Jill/Sibling; Patient Name: [name]; PCP: Ronna Polio (Adults only); Best Callback Phone Number: [phone]. Daughter wants to make an appt with the physician for her mother.  She states her mother got up this morning around 03:30 a.m.  to help her husband to the bathroom and "passed out".  Daughter arrived at her home and was weak but oriented.  Denied chest pain, no complaints of headache. Daughter states she "passes out frequently".   Daughter states she had urinated on herself during this episode.  Reports that  these "Lightheaded episodes occurs about twice a week all her life".  Declined any 911 /ER. Would like to have her mother evaluated in the office. She feels she is run down caring for her husband for the last year who has had a stroke.  Called and spoke with patient [phone].  She states she has not felt well all weak and tired.  She bent over and felt dizzy and passed out. She remembers her husband talking to her. She does report a headache this week but none now. She is up this morning eating cereal.  She did a flutter in her chest while waiting on her daughter to arrive and felt like she needed to have a BM with some abdominal cramping. Denies any chest pain or discomfort at present.  Emergent s/sx ruled out per Fainting Protocol with the exception of "Fainting episode without any warning symptoms". See in 4 hours. No appt available. Advised ER. Patient aware and will go in for evaluation. She will call her daughter now.

## 2012-10-06 NOTE — Telephone Encounter (Signed)
Spoke with patients daughter [name]) and she will have her daughter Jamesetta Orleans) take patient to the ED now.

## 2012-10-07 ENCOUNTER — Other Ambulatory Visit: Payer: Self-pay | Admitting: Internal Medicine

## 2012-11-09 ENCOUNTER — Ambulatory Visit: Payer: Self-pay | Admitting: Internal Medicine

## 2012-11-12 LAB — HM MAMMOGRAPHY: HM Mammogram: NORMAL

## 2012-11-20 ENCOUNTER — Encounter: Payer: Self-pay | Admitting: Adult Health

## 2012-11-20 ENCOUNTER — Ambulatory Visit (INDEPENDENT_AMBULATORY_CARE_PROVIDER_SITE_OTHER): Payer: Medicare Other | Admitting: Adult Health

## 2012-11-20 VITALS — BP 132/76 | HR 63 | Temp 97.9°F | Resp 16 | Ht 68.0 in | Wt 149.0 lb

## 2012-11-20 DIAGNOSIS — M25569 Pain in unspecified knee: Secondary | ICD-10-CM

## 2012-11-20 DIAGNOSIS — E785 Hyperlipidemia, unspecified: Secondary | ICD-10-CM

## 2012-11-20 DIAGNOSIS — M542 Cervicalgia: Secondary | ICD-10-CM | POA: Insufficient documentation

## 2012-11-20 DIAGNOSIS — E559 Vitamin D deficiency, unspecified: Secondary | ICD-10-CM

## 2012-11-20 DIAGNOSIS — M25562 Pain in left knee: Secondary | ICD-10-CM | POA: Insufficient documentation

## 2012-11-20 DIAGNOSIS — Z0000 Encounter for general adult medical examination without abnormal findings: Secondary | ICD-10-CM | POA: Insufficient documentation

## 2012-11-20 DIAGNOSIS — R002 Palpitations: Secondary | ICD-10-CM

## 2012-11-20 DIAGNOSIS — R040 Epistaxis: Secondary | ICD-10-CM

## 2012-11-20 DIAGNOSIS — E039 Hypothyroidism, unspecified: Secondary | ICD-10-CM

## 2012-11-20 DIAGNOSIS — N8110 Cystocele, unspecified: Secondary | ICD-10-CM

## 2012-11-20 DIAGNOSIS — Z23 Encounter for immunization: Secondary | ICD-10-CM

## 2012-11-20 LAB — TSH: TSH: 1.66 u[IU]/mL (ref 0.35–5.50)

## 2012-11-20 MED ORDER — TETANUS-DIPHTH-ACELL PERTUSSIS 5-2.5-18.5 LF-MCG/0.5 IM SUSP: 0.5000 mL | Freq: Once | INTRAMUSCULAR | Status: DC

## 2012-11-20 NOTE — Progress Notes (Signed)
Subjective:    Patient ID: [name], female    DOB: 08/13/1941, 71 y.o.   MRN: [id number]  HPI  The patient is here for annual Medicare wellness examination including update on immunizations and appropriate health screening based on the USPSTF guidelines.  Management of other chronic and acute problems were also addressed.   Pt is 100% independent of ADLs and IADLs  Home safety was assessed - there are smoke detectors in the home. Patient wears seatbelts consistently. No firearms in the home. No intimate partner violence.  Patient sees eye doctor yearly. No hearing deficits reported.  Patient follows a healthy diet and exercises regularly consisting of aerobic and strength training.  Depression screen showed no evidence of anhedonia, sadness, suicidal ideations, changes in appetite.  New problems:   Neck pain. Has not been able to identify aggravating event. Her husband is in [facility] and she is main caregiver. L knee pain posteriorly with walking. Wearing knee braces. She is participating in aerobic exercises, weights.   Current Outpatient Prescriptions on File Prior to Visit  Medication Sig Dispense Refill  . aspirin EC 81 MG tablet Take 81 mg by mouth daily.        . Calcium Carbonate-Vit D-Min (CALCIUM 1200 PO) Take by mouth daily.        . ergocalciferol (VITAMIN D2) 50000 UNITS capsule Take 1 capsule (50,000 Units total) by mouth every 30 (thirty) days.  3 capsule  1  . fish oil-omega-3 fatty acids 1000 MG capsule Take 1 g by mouth daily.        . Glucosamine HCl 1000 MG TABS Take by mouth daily.        Marland Kitchen levothyroxine (SYNTHROID, LEVOTHROID) 50 MCG tablet Take 1 tablet (50 mcg total) by mouth daily.  90 tablet  3  . Multiple Vitamins-Minerals (MULTIVITAMIN WITH MINERALS) tablet Take 1 tablet by mouth daily.        . niacin 500 MG CR capsule Take 2 capsules (1,000 mg total) by mouth at bedtime.  180 capsule  2  . simvastatin (ZOCOR) 20 MG tablet TAKE 1 TABLET BY MOUTH AT  BEDTIME  90 tablet  1   No current facility-administered medications on file prior to visit.     Review of Systems  Constitutional: Negative.   HENT: Positive for neck pain.        Posterior right lateral towards trapezius muscle  Eyes: Negative.   Respiratory: Negative.   Cardiovascular: Negative.   Gastrointestinal: Negative.   Genitourinary: Negative.   Musculoskeletal:       L knee posterior pain with walking  Neurological: Negative.   Psychiatric/Behavioral: Negative.     BP 132/76  Pulse 63  Temp 97.9 F (36.6 C) (Oral)  Resp 16  Ht 5\' 8"  (1.727 m)  Wt 149 lb (67.586 kg)  BMI 22.66 kg/m2  SpO2 100%    Objective:   Physical Exam  Constitutional: She is oriented to person, place, and time. She appears well-developed and well-nourished. No distress.  HENT:  Head: Normocephalic and atraumatic.  Right Ear: External ear normal.  Left Ear: External ear normal.  Nose: Nose normal.  Mouth/Throat: Oropharynx is clear and moist.  Eyes: Conjunctivae normal and EOM are normal. Pupils are equal, round, and reactive to light. Right eye exhibits no discharge. Left eye exhibits no discharge.  Neck: Normal range of motion. Neck supple. No tracheal deviation present. No thyromegaly present.  Cardiovascular: Normal rate, regular rhythm, normal heart sounds and intact distal  pulses.  Exam reveals no gallop.   No murmur heard. Pulmonary/Chest: Effort normal and breath sounds normal. No respiratory distress. She has no wheezes. She exhibits no tenderness.  Abdominal: Soft. Bowel sounds are normal. She exhibits no distension and no mass. There is no tenderness.  Musculoskeletal: She exhibits tenderness.       Right posterior neck on palpation  Lymphadenopathy:    She has no cervical adenopathy.  Neurological: She is alert and oriented to person, place, and time. She has normal reflexes. No cranial nerve deficit. Coordination normal.  Skin: Skin is warm and dry. No rash noted.    Psychiatric: She has a normal mood and affect. Her behavior is normal. Judgment and thought content normal.  Breast exam without any nodules palpable. Mammogram performed 10/2012.         Assessment & Plan:

## 2012-11-20 NOTE — Assessment & Plan Note (Addendum)
Appears muscular in origin. Ice/heat to area for 20 minutes at a time, 3-4 times daily; anti-inflammatory meds (advil or aleve) for 3-4 days; stretching exercises. Patient will call if symptoms do not improve.

## 2012-11-20 NOTE — Assessment & Plan Note (Signed)
Currently on synthroid 50 mcg. TSH ordered.

## 2012-11-20 NOTE — Assessment & Plan Note (Addendum)
Currently on simvastatin 20 mg daily. Lipid panel ordered. Patient is currently not fasting. She will return for labs within a few days. Follows healthy diet.

## 2012-11-20 NOTE — Patient Instructions (Addendum)
  Please have your labs drawn before you leave the office.  You will need to return for your fasting labs (CMet and Lipid Panel).  You are doing a wonderful job with exercise and eating a healthy diet. Please continue the good work.  Your neck pain appears to involve the trapezius muscle. Stretching exercises, ice/heat for 20 min at a time and medication such as advil to reduce swelling may help. If you do not have relief within 4-6 weeks we will need to do further testing.  You can try the Tommie Copper knee brace to see if this will help your pain. You can also try ice, heat to the area. Again, if your symptoms do not resolve within 4-6 weeks we will need to further explore.  Please call with any questions or concerns.  We will notify you of your results once they are available.

## 2012-11-20 NOTE — Assessment & Plan Note (Signed)
This has resolved.

## 2012-11-20 NOTE — Assessment & Plan Note (Signed)
Followed by Dr. Greggory Keen in [place]. He ordered a pessary ring but patient is not pleased with this. She has not been using it. She has a f/u appointment in January and she is going to discuss sgy with MD.

## 2012-11-20 NOTE — Assessment & Plan Note (Signed)
Currently on Vit D replacement.

## 2012-11-20 NOTE — Assessment & Plan Note (Signed)
Resolved

## 2012-11-20 NOTE — Assessment & Plan Note (Signed)
No effusion. No crepitus noted. Apply ice/heat to area for 20 minutes 3-4 times daily. Rest. Elevate the extremity. Can use NSAIDs for pain/inflammation. She uses a brace when she walks. Discussed OTC product (Tommie Copper knee brace). Patient will explore this further. RTC if symptoms do not improve for further evaluation.

## 2012-11-21 LAB — CBC WITH DIFFERENTIAL/PLATELET
Basophils Absolute: 0 10*3/uL (ref 0.0–0.1)
Basophils Relative: 0.4 % (ref 0.0–3.0)
Eosinophils Absolute: 0.2 10*3/uL (ref 0.0–0.7)
Eosinophils Relative: 3.6 % (ref 0.0–5.0)
HCT: 38 % (ref 36.0–46.0)
Hemoglobin: 12.5 g/dL (ref 12.0–15.0)
Lymphocytes Relative: 24.9 % (ref 12.0–46.0)
Lymphs Abs: 1.5 10*3/uL (ref 0.7–4.0)
MCHC: 32.8 g/dL (ref 30.0–36.0)
MCV: 95.6 fl (ref 78.0–100.0)
Monocytes Absolute: 0.3 10*3/uL (ref 0.1–1.0)
Monocytes Relative: 5.9 % (ref 3.0–12.0)
Neutro Abs: 3.8 10*3/uL (ref 1.4–7.7)
Neutrophils Relative %: 65.2 % (ref 43.0–77.0)
Platelets: 157 10*3/uL (ref 150.0–400.0)
RBC: 3.98 Mil/uL (ref 3.87–5.11)
RDW: 13.5 % (ref 11.5–14.6)
WBC: 5.9 10*3/uL (ref 4.5–10.5)

## 2012-11-22 HISTORY — PX: VAGINAL PROLAPSE REPAIR: SHX830

## 2012-11-27 ENCOUNTER — Encounter: Payer: Self-pay | Admitting: Internal Medicine

## 2012-12-15 ENCOUNTER — Other Ambulatory Visit (INDEPENDENT_AMBULATORY_CARE_PROVIDER_SITE_OTHER): Payer: Medicare Other

## 2012-12-15 ENCOUNTER — Telehealth: Payer: Self-pay | Admitting: Internal Medicine

## 2012-12-15 ENCOUNTER — Other Ambulatory Visit: Payer: Self-pay | Admitting: Adult Health

## 2012-12-15 DIAGNOSIS — E039 Hypothyroidism, unspecified: Secondary | ICD-10-CM

## 2012-12-15 DIAGNOSIS — R799 Abnormal finding of blood chemistry, unspecified: Secondary | ICD-10-CM

## 2012-12-15 DIAGNOSIS — E785 Hyperlipidemia, unspecified: Secondary | ICD-10-CM

## 2012-12-15 DIAGNOSIS — Z0000 Encounter for general adult medical examination without abnormal findings: Secondary | ICD-10-CM

## 2012-12-15 LAB — COMPREHENSIVE METABOLIC PANEL
ALT: 60 U/L — ABNORMAL HIGH (ref 0–35)
AST: 50 U/L — ABNORMAL HIGH (ref 0–37)
Albumin: 3.8 g/dL (ref 3.5–5.2)
Alkaline Phosphatase: 39 U/L (ref 39–117)
BUN: 16 mg/dL (ref 6–23)
CO2: 32 mEq/L (ref 19–32)
Calcium: 9.2 mg/dL (ref 8.4–10.5)
Chloride: 106 mEq/L (ref 96–112)
Creatinine, Ser: 0.8 mg/dL (ref 0.4–1.2)
GFR: 80.83 mL/min (ref >60.00)
Glucose, Bld: 112 mg/dL — ABNORMAL HIGH (ref 70–99)
Potassium: 4.9 mEq/L (ref 3.5–5.1)
Sodium: 143 mEq/L (ref 135–145)
Total Bilirubin: 0.6 mg/dL (ref 0.3–1.2)
Total Protein: 7 g/dL (ref 6.0–8.3)

## 2012-12-15 LAB — LIPID PANEL
Cholesterol: 154 mg/dL (ref 0–200)
HDL: 75.4 mg/dL (ref >39.00)
LDL Cholesterol: 66 mg/dL (ref 0–99)
Total CHOL/HDL Ratio: 2
Triglycerides: 65 mg/dL (ref 0.0–149.0)
VLDL: 13 mg/dL (ref 0.0–40.0)

## 2012-12-15 MED ORDER — PHENAZOPYRIDINE HCL 95 MG PO TABS: 95.0000 mg | ORAL_TABLET | Freq: Three times a day (TID) | ORAL | Status: DC | PRN

## 2012-12-15 MED ORDER — CIPROFLOXACIN HCL 250 MG PO TABS: 250.0000 mg | ORAL_TABLET | Freq: Two times a day (BID) | ORAL | Status: DC

## 2012-12-15 NOTE — Telephone Encounter (Signed)
Patient Information:  Caller Name: Sunya  Phone: [phone]  Patient: [name], [name]  Gender: Female  DOB: 09-14-1941  Age: 72 Years  PCP: Ronna Polio (Adults only)  Office Follow Up:  Does the office need to follow up with this patient?: Yes  Instructions For The Office: See in office today. Hx of frequent UTI's. Please let her know if she can be seen today or come give Urine sample.  RN Note:  All appointments are full. Please call back to et her know if she can be worked in.  Symptoms  Reason For Call & Symptoms: Calling about frequency/urgency and some pain with urination onset 1000 12/15/12. She has hx bladder prolapse- Surgery scheduled for end of Feb.  Reviewed Health History In EMR: Yes  Reviewed Medications In EMR: Yes  Reviewed Allergies In EMR: Yes  Reviewed Surgeries / Procedures: Yes  Date of Onset of Symptoms: 12/15/2012  Treatments Tried: Increasing fluids  Treatments Tried Worked: No  Guideline(s) Used:  Urination Pain - Female  Disposition Per Guideline:   See Today in Office  Reason For Disposition Reached:   Age > 50 years  Advice Given:  Fluids:   Drink extra fluids. Drink 8-10 glasses of liquids a day (Reason: to produce a dilute, non-irritating urine).  Call Back If:  You become worse.

## 2012-12-15 NOTE — Telephone Encounter (Signed)
Patient called [name] but message was not received by our office on Friday afternoon. When I called patient to discuss lab results done earlier in the week she thought I was calling about her symptoms of uti.  Called in Cipro x 3 days. Pyridium for dysuria.

## 2012-12-15 NOTE — Progress Notes (Signed)
Elevated glucose x 2.  Elevated liver enzymes.  Stop Zocor. Recheck liver enzymes in 1 month Check HgbA1C

## 2013-01-18 ENCOUNTER — Ambulatory Visit: Payer: Self-pay | Admitting: Obstetrics and Gynecology

## 2013-01-18 LAB — CBC
HCT: 37.8 % (ref 35.0–47.0)
HGB: 12.7 g/dL (ref 12.0–16.0)
MCH: 31.9 pg (ref 26.0–34.0)
MCHC: 33.7 g/dL (ref 32.0–36.0)
MCV: 95 fL (ref 80–100)
Platelet: 301 10*3/uL (ref 150–440)
RBC: 4 10*6/uL (ref 3.80–5.20)
RDW: 12.9 % (ref 11.5–14.5)
WBC: 5 10*3/uL (ref 3.6–11.0)

## 2013-01-18 LAB — BASIC METABOLIC PANEL
Anion Gap: 1 — ABNORMAL LOW (ref 7–16)
BUN: 13 mg/dL (ref 7–18)
Calcium, Total: 9.3 mg/dL (ref 8.5–10.1)
Chloride: 106 mmol/L (ref 98–107)
Co2: 34 mmol/L — ABNORMAL HIGH (ref 21–32)
Creatinine: 0.7 mg/dL (ref 0.60–1.30)
EGFR (African American): >60
EGFR (Non-African Amer.): >60
Glucose: 88 mg/dL (ref 65–99)
Osmolality: 281 (ref 275–301)
Potassium: 4.6 mmol/L (ref 3.5–5.1)
Sodium: 141 mmol/L (ref 136–145)

## 2013-01-22 ENCOUNTER — Ambulatory Visit: Payer: Self-pay | Admitting: Obstetrics and Gynecology

## 2013-01-23 LAB — HEMOGLOBIN: HGB: 11.5 g/dL — ABNORMAL LOW (ref 12.0–16.0)

## 2013-02-13 ENCOUNTER — Other Ambulatory Visit: Payer: Medicare Other

## 2013-02-13 ENCOUNTER — Other Ambulatory Visit (INDEPENDENT_AMBULATORY_CARE_PROVIDER_SITE_OTHER): Payer: Medicare Other

## 2013-02-13 DIAGNOSIS — R799 Abnormal finding of blood chemistry, unspecified: Secondary | ICD-10-CM

## 2013-02-13 LAB — HEPATIC FUNCTION PANEL
ALT: 26 U/L (ref 0–35)
AST: 29 U/L (ref 0–37)
Albumin: 3.7 g/dL (ref 3.5–5.2)
Alkaline Phosphatase: 48 U/L (ref 39–117)
Bilirubin, Direct: 0.1 mg/dL (ref 0.0–0.3)
Total Bilirubin: 0.7 mg/dL (ref 0.3–1.2)
Total Protein: 7.4 g/dL (ref 6.0–8.3)

## 2013-02-13 LAB — HEMOGLOBIN A1C: Hgb A1c MFr Bld: 6 % (ref 4.6–6.5)

## 2013-02-19 ENCOUNTER — Encounter: Payer: Self-pay | Admitting: Internal Medicine

## 2013-02-19 ENCOUNTER — Ambulatory Visit (INDEPENDENT_AMBULATORY_CARE_PROVIDER_SITE_OTHER): Payer: Medicare Other | Admitting: Internal Medicine

## 2013-02-19 VITALS — BP 130/78 | HR 63 | Temp 98.3°F | Wt 145.0 lb

## 2013-02-19 DIAGNOSIS — E039 Hypothyroidism, unspecified: Secondary | ICD-10-CM

## 2013-02-19 DIAGNOSIS — E785 Hyperlipidemia, unspecified: Secondary | ICD-10-CM

## 2013-02-19 DIAGNOSIS — N8110 Cystocele, unspecified: Secondary | ICD-10-CM

## 2013-02-19 NOTE — Progress Notes (Signed)
Subjective:    Patient ID: [name], female    DOB: 1940/12/23, 72 y.o.   MRN: [id number]  HPI [name] with h/o hypothyroidism, hyperlipidemia, vaginal prolapse presents for follow up. Doing well.  HT - Energy level good. No symptoms of temp intolerance, constipation. Compliant with levothyroxine.  HL - Stopped Simvastatin in 10/2012 because of elevated LFTs. Repeat LFTs normal. Tries to follow healthy diet and get physical activity.  Vaginal Prolapse- s/p repair 3 weeks ago. Doing well. No pain. No issues with voiding or stools.  Outpatient Encounter Prescriptions as of 02/19/2013  Medication Sig Dispense Refill  . aspirin EC 81 MG tablet Take 81 mg by mouth daily.        . Calcium Carbonate-Vit D-Min (CALCIUM 1200 PO) Take by mouth daily.        . fish oil-omega-3 fatty acids 1000 MG capsule Take 1 g by mouth daily.        . Glucosamine HCl 1000 MG TABS Take by mouth daily.        Marland Kitchen levothyroxine (SYNTHROID, LEVOTHROID) 50 MCG tablet Take 1 tablet (50 mcg total) by mouth daily.  90 tablet  3  . Multiple Vitamins-Minerals (MULTIVITAMIN WITH MINERALS) tablet Take 1 tablet by mouth daily.        . simvastatin (ZOCOR) 20 MG tablet TAKE 1 TABLET BY MOUTH AT BEDTIME  90 tablet  1   No facility-administered encounter medications on file as of 02/19/2013.   BP 130/78  Pulse 63  Temp(Src) 98.3 F (36.8 C) (Oral)  Wt 145 lb (65.772 kg)  BMI 22.05 kg/m2  SpO2 97%  Review of Systems  Constitutional: Negative for fever, chills, appetite change, fatigue and unexpected weight change.  HENT: Negative for ear pain, congestion, sore throat, trouble swallowing, neck pain, voice change and sinus pressure.   Eyes: Negative for visual disturbance.  Respiratory: Negative for cough, shortness of breath, wheezing and stridor.   Cardiovascular: Negative for chest pain, palpitations and leg swelling.  Gastrointestinal: Negative for nausea, vomiting, abdominal pain, diarrhea, constipation,  blood in stool, abdominal distention and anal bleeding.  Genitourinary: Negative for dysuria and flank pain.  Musculoskeletal: Negative for myalgias, arthralgias and gait problem.  Skin: Negative for color change and rash.  Neurological: Negative for dizziness and headaches.  Hematological: Negative for adenopathy. Does not bruise/bleed easily.  Psychiatric/Behavioral: Negative for suicidal ideas, sleep disturbance and dysphoric mood. The patient is not nervous/anxious.        Objective:   Physical Exam  Constitutional: She is oriented to person, place, and time. She appears well-developed and well-nourished. No distress.  HENT:  Head: Normocephalic and atraumatic.  Right Ear: External ear normal.  Left Ear: External ear normal.  Nose: Nose normal.  Mouth/Throat: Oropharynx is clear and moist. No oropharyngeal exudate.  Eyes: Conjunctivae are normal. Pupils are equal, round, and reactive to light. Right eye exhibits no discharge. Left eye exhibits no discharge. No scleral icterus.  Neck: Normal range of motion. Neck supple. No tracheal deviation present. No thyromegaly present.  Cardiovascular: Normal rate, regular rhythm, normal heart sounds and intact distal pulses.  Exam reveals no gallop and no friction rub.   No murmur heard. Pulmonary/Chest: Effort normal and breath sounds normal. No respiratory distress. She has no wheezes. She has no rales. She exhibits no tenderness.  Musculoskeletal: Normal range of motion. She exhibits no edema and no tenderness.  Lymphadenopathy:    She has no cervical adenopathy.  Neurological: She is alert and  oriented to person, place, and time. No cranial nerve deficit. She exhibits normal muscle tone. Coordination normal.  Skin: Skin is warm and dry. No rash noted. She is not diaphoretic. No erythema. No pallor.  Psychiatric: She has a normal mood and affect. Her behavior is normal. Judgment and thought content normal.          Assessment & Plan:

## 2013-02-19 NOTE — Assessment & Plan Note (Signed)
Symptomatically doing well. TSH normal in 10/2012. Will continue Synthroid. Recheck TSH in 10/2013.

## 2013-02-19 NOTE — Assessment & Plan Note (Signed)
S/p repair of vaginal prolapse. Doing very well postoperatively. Will continue to monitor.

## 2013-02-19 NOTE — Assessment & Plan Note (Signed)
Simvastatin was recently held because of elevated LFTs. Question if this was cause of elevated LFTs. Discussed other potential causes including viral infection, estrogen supplement. Recent LFTs normal. Will add lipid profile to labs, then make decision about resuming medication.

## 2013-02-20 ENCOUNTER — Telehealth: Payer: Self-pay | Admitting: *Deleted

## 2013-02-20 NOTE — Telephone Encounter (Signed)
Received a call from the lab, they couldn't do the added on for the lipid panel

## 2013-02-20 NOTE — Telephone Encounter (Signed)
Can you let her know that the lab could not add on the lipid panel? Can you ask her to come in to check lipids and LFTs fasting at her convenience?

## 2013-02-21 NOTE — Telephone Encounter (Signed)
Spoke with patient, she can not come in anytime soon. She will call back when she think she will be able to come in.

## 2013-03-02 ENCOUNTER — Telehealth: Payer: Self-pay | Admitting: *Deleted

## 2013-03-02 ENCOUNTER — Other Ambulatory Visit (INDEPENDENT_AMBULATORY_CARE_PROVIDER_SITE_OTHER): Payer: Medicare Other

## 2013-03-02 DIAGNOSIS — E7800 Pure hypercholesterolemia, unspecified: Secondary | ICD-10-CM

## 2013-03-02 DIAGNOSIS — E785 Hyperlipidemia, unspecified: Secondary | ICD-10-CM

## 2013-03-02 LAB — COMPREHENSIVE METABOLIC PANEL
ALT: 27 U/L (ref 0–35)
AST: 29 U/L (ref 0–37)
Albumin: 4 g/dL (ref 3.5–5.2)
Alkaline Phosphatase: 49 U/L (ref 39–117)
BUN: 14 mg/dL (ref 6–23)
CO2: 26 mEq/L (ref 19–32)
Calcium: 9 mg/dL (ref 8.4–10.5)
Chloride: 100 mEq/L (ref 96–112)
Creatinine, Ser: 0.7 mg/dL (ref 0.4–1.2)
GFR: 93.63 mL/min (ref >60.00)
Glucose, Bld: 77 mg/dL (ref 70–99)
Potassium: 4.7 mEq/L (ref 3.5–5.1)
Sodium: 135 mEq/L (ref 135–145)
Total Bilirubin: 0.6 mg/dL (ref 0.3–1.2)
Total Protein: 7.1 g/dL (ref 6.0–8.3)

## 2013-03-02 LAB — LIPID PANEL
Cholesterol: 280 mg/dL — ABNORMAL HIGH (ref 0–200)
HDL: 50.4 mg/dL (ref >39.00)
Total CHOL/HDL Ratio: 6
Triglycerides: 107 mg/dL (ref 0.0–149.0)
VLDL: 21.4 mg/dL (ref 0.0–40.0)

## 2013-03-02 LAB — LDL CHOLESTEROL, DIRECT: Direct LDL: 202.9 mg/dL

## 2013-03-02 NOTE — Telephone Encounter (Signed)
Patient came in for labs but nothing was entered for her, I did see that she was to come back at her convenience for LFT and lipids. However there was no dx code, could you please advise.

## 2013-03-02 NOTE — Telephone Encounter (Signed)
Yes, CMP and lipids, Dx 272.4

## 2013-03-02 NOTE — Telephone Encounter (Signed)
Thank you :)

## 2013-03-10 ENCOUNTER — Other Ambulatory Visit: Payer: Self-pay | Admitting: Internal Medicine

## 2013-03-12 NOTE — Telephone Encounter (Signed)
Rx sent to pharmacy by escript  

## 2013-04-05 ENCOUNTER — Ambulatory Visit: Payer: Medicare Other | Admitting: Internal Medicine

## 2013-04-12 ENCOUNTER — Encounter: Payer: Self-pay | Admitting: Internal Medicine

## 2013-04-12 ENCOUNTER — Ambulatory Visit (INDEPENDENT_AMBULATORY_CARE_PROVIDER_SITE_OTHER): Payer: Medicare Other | Admitting: Internal Medicine

## 2013-04-12 VITALS — BP 100/74 | HR 59 | Temp 98.3°F | Wt 146.0 lb

## 2013-04-12 DIAGNOSIS — E785 Hyperlipidemia, unspecified: Secondary | ICD-10-CM

## 2013-04-12 DIAGNOSIS — I83893 Varicose veins of bilateral lower extremities with other complications: Secondary | ICD-10-CM | POA: Insufficient documentation

## 2013-04-12 MED ORDER — SIMVASTATIN 20 MG PO TABS: ORAL_TABLET | ORAL | Status: DC

## 2013-04-12 NOTE — Assessment & Plan Note (Signed)
Superficial varicose veins noted on exam. Will set up vascular evaluation. Pt would like consultation to discuss options for management.

## 2013-04-12 NOTE — Progress Notes (Signed)
Subjective:    Patient ID: [name], female    DOB: [birth year], 72 y.o.   MRN: [id number]  HPI 72 year old female with history of hypothyroidism and hyperlipidemia presents for followup. For brief period, 2 months, she was off her statin medication. She had cholesterol levels recently rechecked and was found to have LDL of 202. We discussed today that this is consistent with familial hyperlipidemia. She has taken simvastatin in the past with no side effects noted.  She is also concerned today about varicose veins on her lower extremities. These are not painful. They seem to be getting worse over time in regards to appearance. She does not have swelling in her legs. The varicose veins never been evaluated.  Outpatient Encounter Prescriptions as of 04/12/2013  Medication Sig Dispense Refill  . aspirin EC 81 MG tablet Take 81 mg by mouth daily.        . Calcium Carbonate-Vit D-Min (CALCIUM 1200 PO) Take by mouth daily.        . fish oil-omega-3 fatty acids 1000 MG capsule Take 1 g by mouth daily.        . Glucosamine HCl 1000 MG TABS Take by mouth daily.        Marland Kitchen levothyroxine (SYNTHROID, LEVOTHROID) 50 MCG tablet TAKE 1 TABLET (50 MCG TOTAL) BY MOUTH DAILY.  90 tablet  2  . Multiple Vitamins-Minerals (MULTIVITAMIN WITH MINERALS) tablet Take 1 tablet by mouth daily.        . simvastatin (ZOCOR) 20 MG tablet TAKE 1 TABLET BY MOUTH AT BEDTIME  90 tablet  4  . [DISCONTINUED] simvastatin (ZOCOR) 20 MG tablet TAKE 1 TABLET BY MOUTH AT BEDTIME  90 tablet  1   No facility-administered encounter medications on file as of 04/12/2013.   BP 100/74  Pulse 59  Temp(Src) 98.3 F (36.8 C) (Oral)  Wt 146 lb (66.225 kg)  BMI 22.2 kg/m2  SpO2 94%  Review of Systems  Constitutional: Negative for fever, chills, appetite change, fatigue and unexpected weight change.  HENT: Negative for ear pain, congestion, sore throat, trouble swallowing, neck pain, voice change and sinus pressure.   Eyes: Negative  for visual disturbance.  Respiratory: Negative for cough, shortness of breath, wheezing and stridor.   Cardiovascular: Negative for chest pain, palpitations and leg swelling.  Gastrointestinal: Negative for nausea, vomiting, abdominal pain, diarrhea, constipation, blood in stool, abdominal distention and anal bleeding.  Genitourinary: Negative for dysuria and flank pain.  Musculoskeletal: Negative for myalgias, arthralgias and gait problem.  Skin: Negative for color change and rash.  Neurological: Negative for dizziness and headaches.  Hematological: Negative for adenopathy. Does not bruise/bleed easily.  Psychiatric/Behavioral: Negative for suicidal ideas, sleep disturbance and dysphoric mood. The patient is not nervous/anxious.        Objective:   Physical Exam  Constitutional: She is oriented to person, place, and time. She appears well-developed and well-nourished. No distress.  HENT:  Head: Normocephalic and atraumatic.  Right Ear: External ear normal.  Left Ear: External ear normal.  Nose: Nose normal.  Mouth/Throat: Oropharynx is clear and moist. No oropharyngeal exudate.  Eyes: Conjunctivae are normal. Pupils are equal, round, and reactive to light. Right eye exhibits no discharge. Left eye exhibits no discharge. No scleral icterus.  Neck: Normal range of motion. Neck supple. No tracheal deviation present. No thyromegaly present.  Cardiovascular: Normal rate, regular rhythm, normal heart sounds and intact distal pulses.  Exam reveals no gallop and no friction rub.   No murmur heard.  Pulmonary/Chest: Effort normal and breath sounds normal. No respiratory distress. She has no wheezes. She has no rales. She exhibits no tenderness.  Musculoskeletal: Normal range of motion. She exhibits no edema and no tenderness.  Lymphadenopathy:    She has no cervical adenopathy.  Neurological: She is alert and oriented to person, place, and time. No cranial nerve deficit. She exhibits normal  muscle tone. Coordination normal.  Skin: Skin is warm and dry. No rash noted. She is not diaphoretic. No erythema. No pallor.     Psychiatric: She has a normal mood and affect. Her behavior is normal. Judgment and thought content normal.          Assessment & Plan:

## 2013-04-12 NOTE — Assessment & Plan Note (Signed)
LDL 202 consistent with familial hyperlipidemia. Will restart Simvastatin, as excellent control on this medication in the past. Repeat LFTs and lipids in 1 month.

## 2013-04-24 ENCOUNTER — Ambulatory Visit: Payer: Self-pay | Admitting: Ophthalmology

## 2013-04-24 DIAGNOSIS — I10 Essential (primary) hypertension: Secondary | ICD-10-CM

## 2013-05-07 ENCOUNTER — Ambulatory Visit: Payer: Self-pay | Admitting: Ophthalmology

## 2013-05-17 ENCOUNTER — Other Ambulatory Visit: Payer: Medicare Other

## 2013-05-21 ENCOUNTER — Other Ambulatory Visit (INDEPENDENT_AMBULATORY_CARE_PROVIDER_SITE_OTHER): Payer: Medicare Other

## 2013-05-21 DIAGNOSIS — E785 Hyperlipidemia, unspecified: Secondary | ICD-10-CM

## 2013-05-21 LAB — LIPID PANEL
Cholesterol: 184 mg/dL (ref 0–200)
HDL: 57.7 mg/dL
LDL Cholesterol: 107 mg/dL — ABNORMAL HIGH (ref 0–99)
Total CHOL/HDL Ratio: 3
Triglycerides: 99 mg/dL (ref 0.0–149.0)
VLDL: 19.8 mg/dL (ref 0.0–40.0)

## 2013-05-21 LAB — COMPREHENSIVE METABOLIC PANEL WITH GFR
ALT: 36 U/L — ABNORMAL HIGH (ref 0–35)
AST: 24 U/L (ref 0–37)
Albumin: 3.9 g/dL (ref 3.5–5.2)
Alkaline Phosphatase: 36 U/L — ABNORMAL LOW (ref 39–117)
BUN: 18 mg/dL (ref 6–23)
CO2: 29 meq/L (ref 19–32)
Calcium: 9.2 mg/dL (ref 8.4–10.5)
Chloride: 102 meq/L (ref 96–112)
Creatinine, Ser: 0.7 mg/dL (ref 0.4–1.2)
GFR: 84.63 mL/min
Glucose, Bld: 85 mg/dL (ref 70–99)
Potassium: 4.7 meq/L (ref 3.5–5.1)
Sodium: 138 meq/L (ref 135–145)
Total Bilirubin: 0.5 mg/dL (ref 0.3–1.2)
Total Protein: 7.1 g/dL (ref 6.0–8.3)

## 2013-05-23 ENCOUNTER — Other Ambulatory Visit: Payer: Self-pay | Admitting: Internal Medicine

## 2013-05-23 DIAGNOSIS — R7989 Other specified abnormal findings of blood chemistry: Secondary | ICD-10-CM

## 2013-08-07 LAB — HM PAP SMEAR

## 2013-08-22 ENCOUNTER — Other Ambulatory Visit (INDEPENDENT_AMBULATORY_CARE_PROVIDER_SITE_OTHER): Payer: Medicare Other

## 2013-08-22 ENCOUNTER — Encounter: Payer: Self-pay | Admitting: *Deleted

## 2013-08-22 DIAGNOSIS — R7989 Other specified abnormal findings of blood chemistry: Secondary | ICD-10-CM

## 2013-08-22 LAB — COMPREHENSIVE METABOLIC PANEL
ALT: 25 U/L (ref 0–35)
AST: 27 U/L (ref 0–37)
Albumin: 4 g/dL (ref 3.5–5.2)
Alkaline Phosphatase: 35 U/L — ABNORMAL LOW (ref 39–117)
BUN: 15 mg/dL (ref 6–23)
CO2: 32 mEq/L (ref 19–32)
Calcium: 9.3 mg/dL (ref 8.4–10.5)
Chloride: 105 mEq/L (ref 96–112)
Creatinine, Ser: 0.7 mg/dL (ref 0.4–1.2)
GFR: 81.94 mL/min (ref >60.00)
Glucose, Bld: 88 mg/dL (ref 70–99)
Potassium: 4.6 mEq/L (ref 3.5–5.1)
Sodium: 140 mEq/L (ref 135–145)
Total Bilirubin: 0.6 mg/dL (ref 0.3–1.2)
Total Protein: 7.1 g/dL (ref 6.0–8.3)

## 2013-11-06 LAB — HM MAMMOGRAPHY

## 2013-11-12 ENCOUNTER — Ambulatory Visit: Payer: Self-pay | Admitting: Internal Medicine

## 2013-11-23 ENCOUNTER — Encounter: Payer: Medicare Other | Admitting: Internal Medicine

## 2013-12-07 ENCOUNTER — Encounter: Payer: Self-pay | Admitting: Internal Medicine

## 2013-12-07 ENCOUNTER — Ambulatory Visit (INDEPENDENT_AMBULATORY_CARE_PROVIDER_SITE_OTHER): Payer: Medicare Other | Admitting: Internal Medicine

## 2013-12-07 VITALS — BP 120/90 | HR 57 | Temp 98.1°F | Ht 66.5 in | Wt 149.0 lb

## 2013-12-07 DIAGNOSIS — Z0000 Encounter for general adult medical examination without abnormal findings: Secondary | ICD-10-CM

## 2013-12-07 DIAGNOSIS — Z23 Encounter for immunization: Secondary | ICD-10-CM

## 2013-12-07 DIAGNOSIS — R0989 Other specified symptoms and signs involving the circulatory and respiratory systems: Secondary | ICD-10-CM

## 2013-12-07 DIAGNOSIS — E785 Hyperlipidemia, unspecified: Secondary | ICD-10-CM

## 2013-12-07 LAB — COMPREHENSIVE METABOLIC PANEL
ALT: 26 U/L (ref 0–35)
AST: 29 U/L (ref 0–37)
Albumin: 4.1 g/dL (ref 3.5–5.2)
Alkaline Phosphatase: 41 U/L (ref 39–117)
BUN: 13 mg/dL (ref 6–23)
CO2: 31 mEq/L (ref 19–32)
Calcium: 10 mg/dL (ref 8.4–10.5)
Chloride: 103 mEq/L (ref 96–112)
Creatinine, Ser: 0.8 mg/dL (ref 0.4–1.2)
GFR: 77.04 mL/min (ref >60.00)
Glucose, Bld: 86 mg/dL (ref 70–99)
Potassium: 5.2 mEq/L — ABNORMAL HIGH (ref 3.5–5.1)
Sodium: 140 mEq/L (ref 135–145)
Total Bilirubin: 0.4 mg/dL (ref 0.3–1.2)
Total Protein: 7.2 g/dL (ref 6.0–8.3)

## 2013-12-07 LAB — CBC WITH DIFFERENTIAL/PLATELET
Basophils Absolute: 0 10*3/uL (ref 0.0–0.1)
Basophils Relative: 0.4 % (ref 0.0–3.0)
Eosinophils Absolute: 0.2 10*3/uL (ref 0.0–0.7)
Eosinophils Relative: 4.2 % (ref 0.0–5.0)
HCT: 38.7 % (ref 36.0–46.0)
Hemoglobin: 13 g/dL (ref 12.0–15.0)
Lymphocytes Relative: 26.3 % (ref 12.0–46.0)
Lymphs Abs: 1.3 10*3/uL (ref 0.7–4.0)
MCHC: 33.7 g/dL (ref 30.0–36.0)
MCV: 93.2 fl (ref 78.0–100.0)
Monocytes Absolute: 0.3 10*3/uL (ref 0.1–1.0)
Monocytes Relative: 6.3 % (ref 3.0–12.0)
Neutro Abs: 3.1 10*3/uL (ref 1.4–7.7)
Neutrophils Relative %: 62.8 % (ref 43.0–77.0)
Platelets: 204 10*3/uL (ref 150.0–400.0)
RBC: 4.15 Mil/uL (ref 3.87–5.11)
RDW: 13.4 % (ref 11.5–14.6)
WBC: 4.9 10*3/uL (ref 4.5–10.5)

## 2013-12-07 LAB — LIPID PANEL
Cholesterol: 167 mg/dL (ref 0–200)
HDL: 53 mg/dL (ref >39.00)
LDL Cholesterol: 84 mg/dL (ref 0–99)
Total CHOL/HDL Ratio: 3
Triglycerides: 150 mg/dL — ABNORMAL HIGH (ref 0.0–149.0)
VLDL: 30 mg/dL (ref 0.0–40.0)

## 2013-12-07 LAB — MICROALBUMIN / CREATININE URINE RATIO
Creatinine,U: 19.5 mg/dL
Microalb Creat Ratio: 0.5 mg/g (ref 0.0–30.0)
Microalb, Ur: 0.1 mg/dL (ref 0.0–1.9)

## 2013-12-07 LAB — VITAMIN B12: Vitamin B-12: 820 pg/mL (ref 211–911)

## 2013-12-07 LAB — TSH: TSH: 0.54 u[IU]/mL (ref 0.35–5.50)

## 2013-12-07 NOTE — Progress Notes (Signed)
Pre-visit discussion using our clinic review tool. No additional management support is needed unless otherwise documented below in the visit note.  

## 2013-12-07 NOTE — Assessment & Plan Note (Signed)
General medical exam including breast exam normal except as noted. Mammogram is up-to-date. Colonoscopy up to date. Immunizations are up to date with the exception of Prevnar which was given today. Appropriate screening performed. Encouraged healthy diet and regular physical activity. Will check labs including CBC, CMP, lipid profile.

## 2013-12-07 NOTE — Assessment & Plan Note (Signed)
Left carotid bruit noted on exam. Will set up carotid Dopplers for evaluation.

## 2013-12-07 NOTE — Progress Notes (Signed)
Subjective:    Patient ID: [name], female    DOB: 04-12-1941, 73 y.o.   MRN: [id number]  HPI  The patient is here for annual Medicare wellness examination and management of other chronic and acute problems.   The risk factors are reflected in the social history.  The roster of all physicians providing medical care to patient - is listed in the Snapshot section of the chart.  Activities of daily living:  The patient is 100% independent in all ADLs: dressing, toileting, feeding as well as independent mobility. Lives with husband. No pets.  Home safety : The patient has smoke detectors in the home. They wear seatbelts.  There are no firearms at home. There is no violence in the home.   There is no risks for hepatitis, STDs or HIV. There is no history of blood transfusion. They have no travel history to infectious disease endemic areas of the world.  The patient has seen their dentist in the last six month. Dentist - Dr. Marcia Brash They have seen their eye doctor in the last year. S/p left sided cataract removal 2014. Opthalmologist - [facility], Dr. Thomasene Ripple No issues with hearing.  They have deferred audiologic testing in the last year.   They do not  have excessive sun exposure. Discussed the need for sun protection: hats, long sleeves and use of sunscreen if there is significant sun exposure. Dermatologist - [facility]  Diet: the importance of a healthy diet is discussed. They do have a healthy diet.  The benefits of regular aerobic exercise were discussed. Exercise limited recently by caring for her husband. Goes to [employer] for classes 3 days per week.  Depression screen: there are no signs or vegative symptoms of depression- irritability, change in appetite, anhedonia, sadness/tearfullness.  Cognitive assessment: the patient manages all their financial and personal affairs and is actively engaged. They could relate day,date,year and events.  HCPOA - daughter, Donney Dice  The following portions of the patient's history were reviewed and updated as appropriate: allergies, current medications, past family history, past medical history,  past surgical history, past social history  and problem list.  Visual acuity was not assessed per patient preference since she has regular follow up with her ophthalmologist. Hearing and body mass index were assessed and reviewed.   During the course of the visit the patient was educated and counseled about appropriate screening and preventive services including : fall prevention , diabetes screening, nutrition counseling, colorectal cancer screening, and recommended immunizations.    Outpatient Encounter Prescriptions as of 12/07/2013  Medication Sig  . aspirin EC 81 MG tablet Take 81 mg by mouth daily.    . Calcium Carbonate-Vit D-Min (CALCIUM 1200 PO) Take by mouth daily.    . Glucosamine HCl 1000 MG TABS Take by mouth daily.    Javier Docker Oil 1000 MG CAPS Take by mouth daily.  Marland Kitchen levothyroxine (SYNTHROID, LEVOTHROID) 50 MCG tablet TAKE 1 TABLET (50 MCG TOTAL) BY MOUTH DAILY.  . Multiple Vitamins-Minerals (MULTIVITAMIN WITH MINERALS) tablet Take 1 tablet by mouth daily.    . simvastatin (ZOCOR) 20 MG tablet TAKE 1 TABLET BY MOUTH AT BEDTIME   BP 120/90  Pulse 57  Temp(Src) 98.1 F (36.7 C) (Oral)  Ht 5' 6.5" (1.689 m)  Wt 149 lb (67.586 kg)  BMI 23.69 kg/m2  SpO2 96%  Review of Systems  Constitutional: Negative for fever, chills, appetite change, fatigue and unexpected weight change.  HENT: Negative for congestion, ear pain,  sinus pressure, sore throat, trouble swallowing and voice change.   Eyes: Negative for visual disturbance.  Respiratory: Negative for cough, shortness of breath, wheezing and stridor.   Cardiovascular: Negative for chest pain, palpitations and leg swelling.  Gastrointestinal: Negative for nausea, vomiting, abdominal pain, diarrhea, constipation, blood in stool, abdominal distention and anal  bleeding.  Genitourinary: Negative for dysuria and flank pain.  Musculoskeletal: Negative for arthralgias, gait problem, myalgias and neck pain.  Skin: Negative for color change and rash.  Neurological: Negative for dizziness and headaches.  Hematological: Negative for adenopathy. Does not bruise/bleed easily.  Psychiatric/Behavioral: Negative for suicidal ideas, sleep disturbance and dysphoric mood. The patient is not nervous/anxious.        Objective:   Physical Exam  Constitutional: She is oriented to person, place, and time. She appears well-developed and well-nourished. No distress.  HENT:  Head: Normocephalic and atraumatic.  Right Ear: External ear normal.  Left Ear: External ear normal.  Nose: Nose normal.  Mouth/Throat: Oropharynx is clear and moist. No oropharyngeal exudate.  Eyes: Conjunctivae are normal. Pupils are equal, round, and reactive to light. Right eye exhibits no discharge. Left eye exhibits no discharge. No scleral icterus.  Neck: Normal range of motion. Neck supple. Carotid bruit is present (left). No tracheal deviation present. No thyromegaly present.  Cardiovascular: Normal rate, regular rhythm, normal heart sounds and intact distal pulses.  Exam reveals no gallop and no friction rub.   No murmur heard. Pulmonary/Chest: Effort normal and breath sounds normal. No accessory muscle usage. Not tachypneic. No respiratory distress. She has no decreased breath sounds. She has no wheezes. She has no rales. She exhibits no tenderness. Right breast exhibits no inverted nipple, no mass, no nipple discharge, no skin change and no tenderness. Left breast exhibits no inverted nipple, no mass, no nipple discharge, no skin change and no tenderness. Breasts are symmetrical.  Abdominal: Soft. Bowel sounds are normal. She exhibits no distension and no mass. There is no tenderness. There is no rebound and no guarding.  Musculoskeletal: Normal range of motion. She exhibits no edema and  no tenderness.  Lymphadenopathy:    She has no cervical adenopathy.  Neurological: She is alert and oriented to person, place, and time. No cranial nerve deficit. She exhibits normal muscle tone. Coordination normal.  Skin: Skin is warm and dry. No rash noted. She is not diaphoretic. No erythema. No pallor.  Psychiatric: She has a normal mood and affect. Her behavior is normal. Judgment and thought content normal.          Assessment & Plan:

## 2013-12-08 ENCOUNTER — Other Ambulatory Visit: Payer: Self-pay | Admitting: Internal Medicine

## 2013-12-11 ENCOUNTER — Other Ambulatory Visit: Payer: Self-pay | Admitting: *Deleted

## 2013-12-11 ENCOUNTER — Telehealth: Payer: Self-pay | Admitting: *Deleted

## 2013-12-11 DIAGNOSIS — E875 Hyperkalemia: Secondary | ICD-10-CM

## 2013-12-11 NOTE — Telephone Encounter (Signed)
BMP for hyperkalemia 

## 2013-12-11 NOTE — Telephone Encounter (Signed)
Pt is coming in for labs tomorrow what labs and dx?  

## 2013-12-12 ENCOUNTER — Other Ambulatory Visit (INDEPENDENT_AMBULATORY_CARE_PROVIDER_SITE_OTHER): Payer: Medicare Other

## 2013-12-12 DIAGNOSIS — E875 Hyperkalemia: Secondary | ICD-10-CM

## 2013-12-12 LAB — BASIC METABOLIC PANEL
BUN: 18 mg/dL (ref 6–23)
CO2: 32 mEq/L (ref 19–32)
Calcium: 9.9 mg/dL (ref 8.4–10.5)
Chloride: 104 mEq/L (ref 96–112)
Creatinine, Ser: 0.8 mg/dL (ref 0.4–1.2)
GFR: 79.38 mL/min (ref >60.00)
Glucose, Bld: 101 mg/dL — ABNORMAL HIGH (ref 70–99)
Potassium: 4.9 mEq/L (ref 3.5–5.1)
Sodium: 141 mEq/L (ref 135–145)

## 2013-12-13 ENCOUNTER — Encounter: Payer: Self-pay | Admitting: *Deleted

## 2013-12-17 ENCOUNTER — Telehealth: Payer: Self-pay | Admitting: Internal Medicine

## 2013-12-17 NOTE — Telephone Encounter (Signed)
Carotid dopplers were normal. 

## 2013-12-18 NOTE — Telephone Encounter (Signed)
Patient informed and verbalized understanding

## 2014-01-11 ENCOUNTER — Encounter: Payer: Self-pay | Admitting: Internal Medicine

## 2014-07-06 ENCOUNTER — Other Ambulatory Visit: Payer: Self-pay | Admitting: Internal Medicine

## 2014-07-17 ENCOUNTER — Encounter: Payer: Self-pay | Admitting: Surgery

## 2014-07-17 ENCOUNTER — Ambulatory Visit: Payer: Self-pay | Admitting: Internal Medicine

## 2014-07-23 ENCOUNTER — Encounter: Payer: Self-pay | Admitting: Surgery

## 2014-08-03 ENCOUNTER — Other Ambulatory Visit: Payer: Self-pay | Admitting: Internal Medicine

## 2014-08-05 NOTE — Telephone Encounter (Signed)
Pt notified of need for labs and appt with Dr. Gilford Rile. Scheduled 09/06/14. Rx sent to pharmacy by escript

## 2014-08-22 ENCOUNTER — Encounter: Payer: Self-pay | Admitting: Surgery

## 2014-09-02 ENCOUNTER — Telehealth: Payer: Self-pay | Admitting: *Deleted

## 2014-09-02 NOTE — Telephone Encounter (Signed)
Pt is coming in tomorrow what labs and dx?  

## 2014-09-03 ENCOUNTER — Other Ambulatory Visit (INDEPENDENT_AMBULATORY_CARE_PROVIDER_SITE_OTHER): Payer: Medicare Other

## 2014-09-03 DIAGNOSIS — E785 Hyperlipidemia, unspecified: Secondary | ICD-10-CM

## 2014-09-03 DIAGNOSIS — Z79899 Other long term (current) drug therapy: Secondary | ICD-10-CM

## 2014-09-03 DIAGNOSIS — Z0000 Encounter for general adult medical examination without abnormal findings: Secondary | ICD-10-CM

## 2014-09-03 LAB — COMPREHENSIVE METABOLIC PANEL
ALT: 36 U/L — ABNORMAL HIGH (ref 0–35)
AST: 41 U/L — ABNORMAL HIGH (ref 0–37)
Albumin: 3.6 g/dL (ref 3.5–5.2)
Alkaline Phosphatase: 39 U/L (ref 39–117)
BUN: 15 mg/dL (ref 6–23)
CO2: 29 mEq/L (ref 19–32)
Calcium: 9.7 mg/dL (ref 8.4–10.5)
Chloride: 106 mEq/L (ref 96–112)
Creatinine, Ser: 0.8 mg/dL (ref 0.4–1.2)
GFR: 72.57 mL/min (ref >60.00)
Glucose, Bld: 85 mg/dL (ref 70–99)
Potassium: 5.4 mEq/L — ABNORMAL HIGH (ref 3.5–5.1)
Sodium: 141 mEq/L (ref 135–145)
Total Bilirubin: 0.8 mg/dL (ref 0.2–1.2)
Total Protein: 7.2 g/dL (ref 6.0–8.3)

## 2014-09-03 LAB — LIPID PANEL
Cholesterol: 184 mg/dL (ref 0–200)
HDL: 47 mg/dL (ref >39.00)
LDL Cholesterol: 109 mg/dL — ABNORMAL HIGH (ref 0–99)
NonHDL: 137
Total CHOL/HDL Ratio: 4
Triglycerides: 141 mg/dL (ref 0.0–149.0)
VLDL: 28.2 mg/dL (ref 0.0–40.0)

## 2014-09-03 NOTE — Telephone Encounter (Signed)
We should order labs then

## 2014-09-03 NOTE — Telephone Encounter (Signed)
Is this for a physical? 

## 2014-09-03 NOTE — Telephone Encounter (Signed)
What labs and dx?  

## 2014-09-03 NOTE — Telephone Encounter (Signed)
Z00.00

## 2014-09-03 NOTE — Telephone Encounter (Signed)
CMP and lipids.

## 2014-09-03 NOTE — Telephone Encounter (Signed)
What dx code

## 2014-09-03 NOTE — Telephone Encounter (Signed)
She has an OV with you on the 16th

## 2014-09-06 ENCOUNTER — Encounter: Payer: Self-pay | Admitting: *Deleted

## 2014-09-06 ENCOUNTER — Encounter: Payer: Self-pay | Admitting: Internal Medicine

## 2014-09-06 ENCOUNTER — Ambulatory Visit (INDEPENDENT_AMBULATORY_CARE_PROVIDER_SITE_OTHER): Payer: Medicare Other | Admitting: Internal Medicine

## 2014-09-06 VITALS — BP 124/74 | HR 63 | Temp 98.3°F | Ht 66.5 in | Wt 150.5 lb

## 2014-09-06 DIAGNOSIS — E7849 Other hyperlipidemia: Secondary | ICD-10-CM

## 2014-09-06 DIAGNOSIS — E039 Hypothyroidism, unspecified: Secondary | ICD-10-CM

## 2014-09-06 DIAGNOSIS — R945 Abnormal results of liver function studies: Secondary | ICD-10-CM

## 2014-09-06 DIAGNOSIS — E875 Hyperkalemia: Secondary | ICD-10-CM

## 2014-09-06 DIAGNOSIS — R7989 Other specified abnormal findings of blood chemistry: Secondary | ICD-10-CM | POA: Insufficient documentation

## 2014-09-06 DIAGNOSIS — Z23 Encounter for immunization: Secondary | ICD-10-CM

## 2014-09-06 DIAGNOSIS — E785 Hyperlipidemia, unspecified: Secondary | ICD-10-CM

## 2014-09-06 LAB — COMPREHENSIVE METABOLIC PANEL
ALT: 28 U/L (ref 0–35)
AST: 31 U/L (ref 0–37)
Albumin: 3.6 g/dL (ref 3.5–5.2)
Alkaline Phosphatase: 37 U/L — ABNORMAL LOW (ref 39–117)
BUN: 16 mg/dL (ref 6–23)
CO2: 28 mEq/L (ref 19–32)
Calcium: 9.4 mg/dL (ref 8.4–10.5)
Chloride: 103 mEq/L (ref 96–112)
Creatinine, Ser: 0.9 mg/dL (ref 0.4–1.2)
GFR: 69.62 mL/min (ref >60.00)
Glucose, Bld: 79 mg/dL (ref 70–99)
Potassium: 4.9 mEq/L (ref 3.5–5.1)
Sodium: 138 mEq/L (ref 135–145)
Total Bilirubin: 0.6 mg/dL (ref 0.2–1.2)
Total Protein: 7.3 g/dL (ref 6.0–8.3)

## 2014-09-06 LAB — TSH: TSH: 0.6 u[IU]/mL (ref 0.35–4.50)

## 2014-09-06 MED ORDER — SIMVASTATIN 20 MG PO TABS: 20.0000 mg | ORAL_TABLET | Freq: Every day | ORAL | Status: DC

## 2014-09-06 NOTE — Assessment & Plan Note (Signed)
Lab Results  Component Value Date   LDLCALC 109* 09/03/2014   Lipids well controlled on simvastatin.

## 2014-09-06 NOTE — Assessment & Plan Note (Signed)
Potassium elevated 5.4. Will recheck with labs today.

## 2014-09-06 NOTE — Assessment & Plan Note (Signed)
Will check TSH with labs today. Continue Levothyroxine. 

## 2014-09-06 NOTE — Addendum Note (Signed)
Addended by: Wynonia Lawman E on: 09/06/2014 10:03 AM   Modules accepted: Orders

## 2014-09-06 NOTE — Progress Notes (Signed)
Subjective:    Patient ID: [name], female    DOB: 07/24/1941, 73 y.o.   MRN: [id number]  HPI 73YO female presents for follow up. Generally feeling well. Had labs on 10/13 which showed slight elevation of potassium at 5.4 and mild elevation of LFTs. No recent changes to meds, but did start taking Vit D supplement and glucosamine to help with joint pain. Continues to care for her husband who is s/p stroke.  Review of Systems  Constitutional: Negative for fever, chills, appetite change, fatigue and unexpected weight change.  Eyes: Negative for visual disturbance.  Respiratory: Negative for shortness of breath.   Cardiovascular: Negative for chest pain and leg swelling.  Gastrointestinal: Negative for nausea, vomiting, abdominal pain, diarrhea and constipation.  Musculoskeletal: Positive for arthralgias and myalgias.  Skin: Negative for color change and rash.  Hematological: Negative for adenopathy. Does not bruise/bleed easily.  Psychiatric/Behavioral: Negative for dysphoric mood. The patient is not nervous/anxious.        Objective:    BP 124/74  Pulse 63  Temp(Src) 98.3 F (36.8 C) (Oral)  Ht 5' 6.5" (1.689 m)  Wt 150 lb 8 oz (68.266 kg)  BMI 23.93 kg/m2  SpO2 96% Physical Exam  Constitutional: She is oriented to person, place, and time. She appears well-developed and well-nourished. No distress.  HENT:  Head: Normocephalic and atraumatic.  Right Ear: External ear normal.  Left Ear: External ear normal.  Nose: Nose normal.  Mouth/Throat: Oropharynx is clear and moist. No oropharyngeal exudate.  Eyes: Conjunctivae and EOM are normal. Pupils are equal, round, and reactive to light. Right eye exhibits no discharge. Left eye exhibits no discharge. No scleral icterus.  Neck: Normal range of motion. Neck supple. No tracheal deviation present. No thyromegaly present.  Cardiovascular: Normal rate, regular rhythm, normal heart sounds and intact distal pulses.  Exam  reveals no gallop and no friction rub.   No murmur heard. Pulmonary/Chest: Effort normal and breath sounds normal. No accessory muscle usage. Not tachypneic. No respiratory distress. She has no decreased breath sounds. She has no wheezes. She has no rhonchi. She has no rales. She exhibits no tenderness.  Abdominal: Soft. Bowel sounds are normal. She exhibits no distension and no mass. There is no tenderness. There is no rebound and no guarding.  Musculoskeletal: Normal range of motion. She exhibits no edema and no tenderness.  Lymphadenopathy:    She has no cervical adenopathy.  Neurological: She is alert and oriented to person, place, and time. No cranial nerve deficit. She exhibits normal muscle tone. Coordination normal.  Skin: Skin is warm and dry. No rash noted. She is not diaphoretic. No erythema. No pallor.  Psychiatric: She has a normal mood and affect. Her behavior is normal. Judgment and thought content normal.          Assessment & Plan:   Problem List Items Addressed This Visit     Unprioritized   Elevated LFTs     LFTs were elevated on labs 10/13. Will recheck today.    Familial hyperlipidemia      Lab Results  Component Value Date   LDLCALC 109* 09/03/2014   Lipids well controlled on simvastatin.     Relevant Medications      simvastatin (ZOCOR) tablet   Other Relevant Orders      Comprehensive metabolic panel   Hyperkalemia     Potassium elevated 5.4. Will recheck with labs today.    Hypothyroidism - Primary     Will  check TSH with labs today. Continue Levothyroxine.    Relevant Orders      TSH       Return in about 6 months (around 03/08/2015) for Wellness Visit.

## 2014-09-06 NOTE — Progress Notes (Signed)
Pre visit review using our clinic review tool, if applicable. No additional management support is needed unless otherwise documented below in the visit note. 

## 2014-09-06 NOTE — Patient Instructions (Signed)
Labs today.  Follow up in 6 months for Wellness Exam

## 2014-09-06 NOTE — Assessment & Plan Note (Signed)
LFTs were elevated on labs 10/13. Will recheck today.

## 2014-09-07 ENCOUNTER — Other Ambulatory Visit: Payer: Self-pay | Admitting: Internal Medicine

## 2014-11-18 ENCOUNTER — Encounter: Payer: Self-pay | Admitting: *Deleted

## 2014-11-18 ENCOUNTER — Ambulatory Visit: Payer: Self-pay | Admitting: Internal Medicine

## 2014-11-18 LAB — HM MAMMOGRAPHY: HM Mammogram: NEGATIVE

## 2014-11-29 DIAGNOSIS — H2511 Age-related nuclear cataract, right eye: Secondary | ICD-10-CM | POA: Diagnosis not present

## 2014-12-16 DIAGNOSIS — H2511 Age-related nuclear cataract, right eye: Secondary | ICD-10-CM | POA: Diagnosis not present

## 2014-12-17 ENCOUNTER — Ambulatory Visit: Payer: Self-pay | Admitting: Ophthalmology

## 2014-12-17 ENCOUNTER — Encounter: Payer: Self-pay | Admitting: Internal Medicine

## 2014-12-17 DIAGNOSIS — H2511 Age-related nuclear cataract, right eye: Secondary | ICD-10-CM | POA: Diagnosis not present

## 2014-12-17 DIAGNOSIS — Z01810 Encounter for preprocedural cardiovascular examination: Secondary | ICD-10-CM | POA: Diagnosis not present

## 2014-12-17 DIAGNOSIS — I499 Cardiac arrhythmia, unspecified: Secondary | ICD-10-CM | POA: Diagnosis not present

## 2014-12-23 ENCOUNTER — Ambulatory Visit: Payer: Self-pay | Admitting: Ophthalmology

## 2014-12-23 DIAGNOSIS — H2511 Age-related nuclear cataract, right eye: Secondary | ICD-10-CM | POA: Diagnosis not present

## 2014-12-23 DIAGNOSIS — Z90710 Acquired absence of both cervix and uterus: Secondary | ICD-10-CM | POA: Diagnosis not present

## 2014-12-23 DIAGNOSIS — Z9889 Other specified postprocedural states: Secondary | ICD-10-CM | POA: Diagnosis not present

## 2014-12-23 DIAGNOSIS — Z9849 Cataract extraction status, unspecified eye: Secondary | ICD-10-CM | POA: Diagnosis not present

## 2014-12-23 DIAGNOSIS — Z79899 Other long term (current) drug therapy: Secondary | ICD-10-CM | POA: Diagnosis not present

## 2014-12-23 DIAGNOSIS — E780 Pure hypercholesterolemia: Secondary | ICD-10-CM | POA: Diagnosis not present

## 2014-12-23 DIAGNOSIS — Z791 Long term (current) use of non-steroidal anti-inflammatories (NSAID): Secondary | ICD-10-CM | POA: Diagnosis not present

## 2015-02-26 ENCOUNTER — Encounter: Payer: Self-pay | Admitting: Nurse Practitioner

## 2015-02-26 ENCOUNTER — Ambulatory Visit (INDEPENDENT_AMBULATORY_CARE_PROVIDER_SITE_OTHER): Payer: Medicare Other | Admitting: Nurse Practitioner

## 2015-02-26 DIAGNOSIS — R300 Dysuria: Secondary | ICD-10-CM

## 2015-02-26 LAB — POCT URINALYSIS DIPSTICK
Bilirubin, UA: NEGATIVE
Glucose, UA: NEGATIVE
Nitrite, UA: NEGATIVE
Protein, UA: 30
Spec Grav, UA: 1.02
Urobilinogen, UA: 0.2
pH, UA: 7

## 2015-02-26 MED ORDER — SULFAMETHOXAZOLE-TRIMETHOPRIM 800-160 MG PO TABS: 1.0000 | ORAL_TABLET | Freq: Two times a day (BID) | ORAL | Status: DC

## 2015-02-26 NOTE — Progress Notes (Signed)
Pre visit review using our clinic review tool, if applicable. No additional management support is needed unless otherwise documented below in the visit note. 

## 2015-02-26 NOTE — Progress Notes (Signed)
   Subjective:    Patient ID: [name], female    DOB: 04/02/1941, 74 y.o.   MRN: [id number]  HPI  [name] is a 74 yo female with a CC of urinary frequency x 1 day.   1) Urinary frequency x 1 day, bladder still fills full after emptying, discomfort and chills with urination.   Azo- started this water and cranberry juice yesterday.   Review of Systems  Genitourinary: Positive for dysuria, frequency and difficulty urinating. Negative for urgency, hematuria and flank pain.      Objective:   Physical Exam  Constitutional: She appears well-developed and well-nourished. No distress.  Eyes: Right eye exhibits no discharge. Left eye exhibits no discharge. No scleral icterus.  Neck: Normal range of motion.  Abdominal: There is no CVA tenderness.  Skin: She is not diaphoretic.      Assessment & Plan:

## 2015-02-26 NOTE — Patient Instructions (Signed)
Take the antibiotic as prescribed.  Call us if not helpful after finishing.

## 2015-03-01 LAB — URINE CULTURE: Colony Count: >=100000

## 2015-03-08 DIAGNOSIS — R300 Dysuria: Secondary | ICD-10-CM | POA: Insufficient documentation

## 2015-03-08 NOTE — Assessment & Plan Note (Signed)
Will treat based on symptoms and POCT urine. Bactrim DS x 5 days, and will obtain culture.

## 2015-03-14 NOTE — Op Note (Signed)
PATIENT NAME:  [name], [name] MR#:  [id number] DATE OF BIRTH:  05-07-1941  DATE OF PROCEDURE:  05/07/2013  PREOPERATIVE DIAGNOSIS: Cataract, left eye.   POSTOPERATIVE DIAGNOSIS: Cataract, left eye.   PROCEDURE PERFORMED: Extracapsular cataract extraction using phacoemulsification with placement of Alcon [id number], 21.0-diopter posterior chamber lens, serial #[id number].   SURGEON: Loura Back. Lorry Anastasi, M.D.   ANESTHESIOLOGIST: Dr. Kayleen Memos.   ASSISTANT:  None.  ANESTHESIA:  4% lidocaine and 0.75% Marcaine in a 50/50 mixture without Wydase added, given as a peribulbar.  COMPLICATIONS:  None.  ESTIMATED BLOOD LOSS:  Less than 1 mL.  DESCRIPTION OF PROCEDURE:  The patient was brought to the operating room and given a peribulbar block.  The patient was then prepped and draped in the usual fashion.  The vertical rectus muscles were imbricated using 5-0 silk sutures.  These sutures were then clamped to the sterile drapes as bridle sutures.  A limbal peritomy was performed extending two clock hours and hemostasis was obtained with cautery. A suture was placed.  A partial-thickness scleral groove was made at the surgical limbus and dissected anteriorly in a lamellar dissection using an Alcon crescent knife.  The anterior chamber was entered supero-temporally with a Superblade and through the lamellar dissection with a 2.6 mm keratome.  DisCoVisc was used to replace the aqueous and a continuous-tear capsulorrhexis was carried out.  Hydrodissection and hydrodelineation were carried out with balanced salt and a 27-gauge cannula.  The nucleus was rotated to confirm the effectiveness of the hydrodissection.  Phacoemulsification was carried out using a divide-and-conquer technique.    Left eye ultrasound time was 59 seconds, with an average power of 24.6%. CDE of 24.3. An AcrySert delivery system was used instead of a Librarian, academic.   Irrigation/aspiration was used to remove the residual cortex.   DisCoVisc was used to inflate the capsule and the internal incision was enlarged to 3 mm with the crescent knife.  The intraocular lens was folded and inserted into the capsular bag using the [employer].  Irrigation/aspiration was used to remove the residual DisCoVisc.  Miostat was injected into the anterior chamber through the paracentesis track to inflate the anterior chamber and induce miosis.  A tenth of a mL of cefuroxime was injected via the paracentesis track.   The wound was checked for leaks and none were found. The conjunctiva was closed with cautery and the bridle sutures were removed.  Two drops of 0.3% Vigamox were placed on the eye.   An eye shield was placed on the eye.  The patient was discharged to the recovery room in good condition.   ____________________________ Loura Back Ndeye Tenorio, MD sad:dm D: 05/07/2013 13:39:11 ET T: 05/07/2013 14:28:14 ET JOB#: [id number]  cc: Remo Lipps A. Deyanna Mctier, MD, <Dictator> Martie Lee MD ELECTRONICALLY SIGNED 05/14/2013 14:03

## 2015-03-14 NOTE — Op Note (Signed)
PATIENT NAME:  [name], [name] MR#:  [id number] DATE OF BIRTH:  Apr 19, 1941  DATE OF PROCEDURE:  01/22/2013  PREOPERATIVE DIAGNOSES: 1.  Third degree cystocele.  2.  Mild rectocele.  3.  Vaginal enterocele.   POSTOPERATIVE DIAGNOSES:  1.  Third degree cystocele.  2.  Mild rectocele.  3.  Vaginal enterocele.   PROCEDURE:  Anterior-posterior colporrhaphy with enterocele ligation.   SURGEON: Alanda Slim. Noma Quijas, MD  FIRST ASSISTANT: Al Decant, PA Student  ANESTHESIA: General endotracheal.   INDICATIONS: The patient is a 74 year old white female, status post hysterectomy in the past, with symptomatic pelvic organ prolapse, who presents for definitive surgery.   FINDINGS: At surgery revealed a third degree cystocele, mild rectocele, and vaginal enterocele.   DESCRIPTION OF PROCEDURE: The patient was brought to the operating room where she was placed in the supine position. General anesthesia was induced without difficulty. She was placed in the dorsal lithotomy position using the candy-cane stirrups. A Betadine perineal and intravaginal prep and drape was performed in the standard fashion. A Foley catheter was placed and was draining clear yellow urine from the bladder. The weighted speculum was placed into the vagina and 2 Allis clamps were placed at the apex of the vagina. A transverse incision was made in the vaginal mucosa. The vaginal mucosa was undermined and incised in the midline with Metzenbaums. This was carried out within 2 cm of the urethral meatus. Allis-Adair retractors were used to facilitate exposure. Once the dissection was complete, the perivesical fascia was dissected from the vagina through sharp and blunt dissection. This mobilized the cystocele. The cystocele was then reduced with vertical mattress sutures of 2-0 Vicryl. The vaginal enterocele was then reduced using a pursestring stitch of 2-0 Vicryl. Once adequately reduced the vaginal mucosa was trimmed and the  anterior vagina was reapproximated in the midline using 2-0 simple interrupted sutures. Next, the posterior colporrhaphy was performed with 2 Allis clamps being placed at the posterior fourchette. A transverse incision was made in the perineum and a triangular wedge of tissue was taken out of the introitus, in the vaginal aspect of the incision. The vaginal mucosa was undermined and incised in the midline. Allis-Adair retractors were used to facilitate exposure. The perirectal fascia was sharp and bluntly dissected from the vagina. The rectocele was then reduced using horizontal mattress sutures of 0 Vicryl. The excess vaginal mucosa was trimmed and the vaginal mucosa was reapproximated in the midline posteriorly using 2-0 chromic simple interrupted sutures. After completion of the procedure, the vagina was packed with Kerlix coated with Premarin cream. The patient was then awakened, extubated and taken to the recovery room in satisfactory condition. Estimated blood loss was 100 mL.  Complications were none. All instrument, needle, and sponge counts were verified as correct. ____________________________ Alanda Slim. Lilliam Chamblee, MD mad:sb D: 01/23/2013 10:50:47 ET T: 01/23/2013 11:21:46 ET JOB#: [id number]  cc: Hassell Done A. Mckinzy Fuller, MD, <Dictator> Alanda Slim Rayel Santizo MD ELECTRONICALLY SIGNED 02/04/2013 17:36

## 2015-03-23 NOTE — Op Note (Signed)
PATIENT NAME:  [name], [name] MR#:  [id number] DATE OF BIRTH:  06-Mar-1941  DATE OF PROCEDURE:  12/23/2014  PREOPERATIVE DIAGNOSIS: Cataract, right eye.   POSTOPERATIVE DIAGNOSIS:  Cataract, right eye.  PROCEDURE PERFORMED: Extracapsular cataract extraction using phacoemulsification with placement Alcon SN6CWS, 21.5 diopter, posterior chamber lens, serial number [id number]   SURGEON: Remo Lipps A. Tivis Wherry, MD   ANESTHESIA: 4% lidocaine, 0.75% Marcaine, a 50-50 mixture with 10 units per mL of Hylenex added, given as a peribulbar.   ANESTHESIOLOGIST:  Dr. Myra Gianotti.   COMPLICATIONS: None.   ESTIMATED BLOOD LOSS: Less than 1 mL.    DESCRIPTION OF PROCEDURE:  The patient was brought to the operating room and given a peribulbar block.  The patient was then prepped and draped in the usual fashion.  The vertical rectus muscles were imbricated using 5-0 silk sutures.  These sutures were then clamped to the sterile drapes as bridle sutures.  A limbal peritomy was performed extending two clock hours and hemostasis was obtained with cautery.  A partial thickness scleral groove was made at the surgical limbus and dissected anteriorly in a lamellar dissection using an Alcon crescent knife.  The anterior chamber was entered superonasally with a Superblade and through the lamellar dissection with a 2.6 mm keratome.  DisCoVisc was used to replace the aqueous and a continuous tear capsulorrhexis was carried out.  Hydrodissection and hydrodelineation were carried out with balanced salt and a 27 gauge canula.  The nucleus was rotated to confirm the effectiveness of the hydrodissection.  Phacoemulsification was carried out using a divide-and-conquer technique.  Total ultrasound time was 1 minute 7 seconds with an average power of 23.8%, CDE of 31.55.  Irrigation/aspiration was used to remove the residual cortex.  DisCoVisc was used to inflate the capsule and the internal incision was enlarged to 3 mm with the  crescent knife.  The intraocular lens was folded and inserted into the capsular bag using the AcrySert delivery system.  Irrigation/aspiration was used to remove the residual DisCoVisc.  A 0.01 mL of cefuroxime was injected into the anterior chamber through the paracentesis track to inflate the anterior chamber and induce miosis.  The wound was checked for leaks and none were found. The conjunctiva was closed with cautery and the bridle sutures were removed.  Two drops of 0.3% Vigamox were placed on the eye.   An eye shield was placed on the eye.  The patient was discharged to the recovery room in good condition.     ____________________________ Karen Back Zakaria Fromer, MD sad:DT D: 12/23/2014 11:27:54 ET T: 12/23/2014 11:58:34 ET JOB#: [id number]  cc: Remo Lipps A. Markeita Alicia, MD, <Dictator> Martie Lee MD ELECTRONICALLY SIGNED 12/30/2014 10:36

## 2015-06-02 ENCOUNTER — Telehealth: Payer: Self-pay | Admitting: Internal Medicine

## 2015-06-02 NOTE — Telephone Encounter (Signed)
Melissa,  Please add pt at 1:00 this Wednesday.  Already spoke with the pt

## 2015-06-02 NOTE — Telephone Encounter (Signed)
Pt called stating that she is having back and slight abdominal pain has another Dr appt for her back coming up.. Pt said that she only wanted to see Dr.Walker and her available appts times didn't work for pt. Pt has an appt 8/2 @9 :30.

## 2015-06-04 ENCOUNTER — Encounter (INDEPENDENT_AMBULATORY_CARE_PROVIDER_SITE_OTHER): Payer: Self-pay

## 2015-06-04 ENCOUNTER — Ambulatory Visit (INDEPENDENT_AMBULATORY_CARE_PROVIDER_SITE_OTHER): Payer: Medicare Other | Admitting: Internal Medicine

## 2015-06-04 ENCOUNTER — Encounter: Payer: Self-pay | Admitting: Internal Medicine

## 2015-06-04 ENCOUNTER — Ambulatory Visit
Admission: RE | Admit: 2015-06-04 | Discharge: 2015-06-04 | Disposition: A | Discharge status: Home / Self Care | Payer: Medicare Other | Source: Ambulatory Visit | Attending: Internal Medicine | Admitting: Internal Medicine

## 2015-06-04 VITALS — BP 144/68 | HR 52 | Temp 98.1°F | Ht 66.5 in | Wt 149.0 lb

## 2015-06-04 DIAGNOSIS — M5136 Other intervertebral disc degeneration, lumbar region: Secondary | ICD-10-CM | POA: Diagnosis not present

## 2015-06-04 DIAGNOSIS — M160 Bilateral primary osteoarthritis of hip: Secondary | ICD-10-CM | POA: Diagnosis not present

## 2015-06-04 DIAGNOSIS — R1031 Right lower quadrant pain: Secondary | ICD-10-CM | POA: Diagnosis not present

## 2015-06-04 DIAGNOSIS — M47816 Spondylosis without myelopathy or radiculopathy, lumbar region: Secondary | ICD-10-CM | POA: Insufficient documentation

## 2015-06-04 DIAGNOSIS — R109 Unspecified abdominal pain: Secondary | ICD-10-CM

## 2015-06-04 DIAGNOSIS — M1288 Other specific arthropathies, not elsewhere classified, other specified site: Secondary | ICD-10-CM | POA: Insufficient documentation

## 2015-06-04 LAB — POCT URINALYSIS DIPSTICK
Bilirubin, UA: NEGATIVE
Blood, UA: NEGATIVE
Glucose, UA: NEGATIVE
Ketones, UA: NEGATIVE
Nitrite, UA: NEGATIVE
Protein, UA: NEGATIVE
Spec Grav, UA: 1.02
Urobilinogen, UA: 0.2
pH, UA: 7

## 2015-06-04 MED ORDER — IOHEXOL 300 MG/ML  SOLN
100.0000 mL | Freq: Once | INTRAMUSCULAR | Status: AC | PRN
  Administered 2015-06-04: 100 mL via INTRAVENOUS

## 2015-06-04 MED ORDER — IOHEXOL 240 MG/ML SOLN
50.0000 mL | Freq: Once | INTRAMUSCULAR | Status: AC | PRN
  Administered 2015-06-04: 50 mL via ORAL

## 2015-06-04 NOTE — Patient Instructions (Signed)
We will get a CT of the abdomen today to evaluate your abdominal pain.  We will call you with results.

## 2015-06-04 NOTE — Progress Notes (Signed)
   Subjective:    Patient ID: [name], female    DOB: 02/11/1941, 74 y.o.   MRN: [id number]  HPI  74YO female presents for acute visit.  Developed right lower abdominal pain and right flank pain on Saturday. No dysuria or hematuria. No fever, chills. No NVD. Some constipation. Not taking anything for pain.  Past medical, surgical, family and social history per today's encounter.  Review of Systems  Constitutional: Negative for fever, chills, appetite change, fatigue and unexpected weight change.  Eyes: Negative for visual disturbance.  Respiratory: Negative for shortness of breath.   Cardiovascular: Negative for chest pain and leg swelling.  Gastrointestinal: Positive for abdominal pain and constipation. Negative for nausea, vomiting, diarrhea, blood in stool and abdominal distention.  Genitourinary: Negative for dysuria, urgency and frequency.  Skin: Negative for color change and rash.  Hematological: Negative for adenopathy. Does not bruise/bleed easily.  Psychiatric/Behavioral: Negative for dysphoric mood. The patient is not nervous/anxious.        Objective:    BP 144/68 mmHg  Pulse 52  Temp(Src) 98.1 F (36.7 C) (Oral)  Ht 5' 6.5" (1.689 m)  Wt 149 lb (67.586 kg)  BMI 23.69 kg/m2  SpO2 98% Physical Exam  Constitutional: She is oriented to person, place, and time. She appears well-developed and well-nourished. No distress.  HENT:  Head: Normocephalic and atraumatic.  Right Ear: External ear normal.  Left Ear: External ear normal.  Nose: Nose normal.  Mouth/Throat: Oropharynx is clear and moist.  Eyes: Conjunctivae and EOM are normal. Pupils are equal, round, and reactive to light. Right eye exhibits no discharge.  Neck: Normal range of motion. Neck supple. No thyromegaly present.  Cardiovascular: Normal rate, regular rhythm, normal heart sounds and intact distal pulses.  Exam reveals no gallop and no friction rub.   No murmur heard. Pulmonary/Chest: Effort  normal. No respiratory distress. She has no wheezes. She has no rales.  Abdominal: Soft. Bowel sounds are normal. She exhibits no distension and no mass. There is tenderness in the right lower quadrant. There is tenderness at McBurney's point. There is no rebound and no guarding.  Musculoskeletal: Normal range of motion. She exhibits no edema or tenderness.  Lymphadenopathy:    She has no cervical adenopathy.  Neurological: She is alert and oriented to person, place, and time. No cranial nerve deficit. Coordination normal.  Skin: Skin is warm and dry. No rash noted. She is not diaphoretic. No erythema. No pallor.  Psychiatric: She has a normal mood and affect. Her behavior is normal. Judgment and thought content normal.          Assessment & Plan:   Problem List Items Addressed This Visit      Unprioritized   Right lower quadrant abdominal pain - Primary    Focal RLQ abdominal pain with rebound. Will get stat CT abdomen to evaluate for appendicitis. Follow up after CT.      Relevant Orders   CT Abdomen Pelvis W Contrast   Urine culture    Other Visit Diagnoses    Flank pain        Relevant Orders    POCT Urinalysis Dipstick (Completed)    Urine culture        Return in about 1 week (around 06/11/2015) for Recheck.

## 2015-06-04 NOTE — Addendum Note (Signed)
Addended by: Ronette Deter A on: 06/04/2015 02:00 PM   Modules accepted: Orders

## 2015-06-04 NOTE — Progress Notes (Signed)
Pre visit review using our clinic review tool, if applicable. No additional management support is needed unless otherwise documented below in the visit note. 

## 2015-06-04 NOTE — Assessment & Plan Note (Signed)
Focal RLQ abdominal pain with rebound. Will get stat CT abdomen to evaluate for appendicitis. Follow up after CT.

## 2015-06-07 ENCOUNTER — Other Ambulatory Visit: Payer: Self-pay | Admitting: Internal Medicine

## 2015-06-08 LAB — URINE CULTURE: Colony Count: 50000

## 2015-06-10 ENCOUNTER — Encounter: Payer: Self-pay | Admitting: Internal Medicine

## 2015-06-10 ENCOUNTER — Ambulatory Visit (INDEPENDENT_AMBULATORY_CARE_PROVIDER_SITE_OTHER): Payer: Medicare Other | Admitting: Internal Medicine

## 2015-06-10 VITALS — BP 136/66 | HR 56 | Temp 98.3°F | Ht 66.5 in | Wt 150.2 lb

## 2015-06-10 DIAGNOSIS — E559 Vitamin D deficiency, unspecified: Secondary | ICD-10-CM | POA: Diagnosis not present

## 2015-06-10 DIAGNOSIS — R5382 Chronic fatigue, unspecified: Secondary | ICD-10-CM | POA: Diagnosis not present

## 2015-06-10 DIAGNOSIS — Z79899 Other long term (current) drug therapy: Secondary | ICD-10-CM

## 2015-06-10 DIAGNOSIS — R1031 Right lower quadrant pain: Secondary | ICD-10-CM

## 2015-06-10 NOTE — Patient Instructions (Addendum)
Try taking Beano prior to meals.  Increase fluid intake.  Follow up in 4 weeks.

## 2015-06-10 NOTE — Assessment & Plan Note (Signed)
Symptoms are improving CT was normal. Suspect bowel gas causing symptoms, given exacerbation with cruciferous veggies. Will add beano before meals. Follow up in 4 weeks and prn.

## 2015-06-10 NOTE — Progress Notes (Signed)
Subjective:    Patient ID: [name], female    DOB: October 14, 1941, 74 y.o.   MRN: [id number]  HPI  74YO female presents for follow up.  Seen 7/13 with RLQ pain. CT abdomen showed no acute process.  Pain has improved. Took at Lax[employer] on Saturday with some improvement. Notes some pain with drinking coffee. Questions gas. Typically has 2 regular BMs per day. No blood in stool. No black stool.  No fever, chills.  No urinary symptoms.  She is also concerned about chronic mild fatigue. She denies dyspnea, chest pain, other symptoms. Of note, she is the primary caregiver for her husband, who had a stroke. She denies sleep issues.  Past medical, surgical, family and social history per today's encounter.  Review of Systems  Constitutional: Negative for fever, chills, appetite change, fatigue and unexpected weight change.  Eyes: Negative for visual disturbance.  Respiratory: Negative for shortness of breath.   Cardiovascular: Negative for chest pain and leg swelling.  Gastrointestinal: Positive for abdominal pain and abdominal distention. Negative for nausea, vomiting, diarrhea and constipation.  Skin: Negative for color change and rash.  Hematological: Negative for adenopathy. Does not bruise/bleed easily.  Psychiatric/Behavioral: Negative for dysphoric mood. The patient is not nervous/anxious.        Objective:    BP 136/66 mmHg  Pulse 56  Temp(Src) 98.3 F (36.8 C) (Oral)  Ht 5' 6.5" (1.689 m)  Wt 150 lb 4 oz (68.153 kg)  BMI 23.89 kg/m2  SpO2 98% Physical Exam  Constitutional: She is oriented to person, place, and time. She appears well-developed and well-nourished. No distress.  HENT:  Head: Normocephalic and atraumatic.  Right Ear: External ear normal.  Left Ear: External ear normal.  Nose: Nose normal.  Mouth/Throat: Oropharynx is clear and moist. No oropharyngeal exudate.  Eyes: Conjunctivae and EOM are normal. Pupils are equal, round, and reactive to light.  Right eye exhibits no discharge. Left eye exhibits no discharge. No scleral icterus.  Neck: Normal range of motion. Neck supple. No tracheal deviation present. No thyromegaly present.  Cardiovascular: Normal rate, regular rhythm, normal heart sounds and intact distal pulses.  Exam reveals no gallop and no friction rub.   No murmur heard. Pulmonary/Chest: Effort normal and breath sounds normal. No respiratory distress. She has no wheezes. She has no rales. She exhibits no tenderness.  Abdominal: Soft. Bowel sounds are normal. She exhibits no distension and no mass. There is no tenderness. There is no rebound and no guarding.  Musculoskeletal: Normal range of motion. She exhibits no edema or tenderness.  Lymphadenopathy:    She has no cervical adenopathy.  Neurological: She is alert and oriented to person, place, and time. No cranial nerve deficit. She exhibits normal muscle tone. Coordination normal.  Skin: Skin is warm and dry. No rash noted. She is not diaphoretic. No erythema. No pallor.  Psychiatric: She has a normal mood and affect. Her behavior is normal. Judgment and thought content normal.          Assessment & Plan:   Problem List Items Addressed This Visit      Unprioritized   Chronic fatigue    Chronic mild fatigue. Exam normal. Will check CBC, B12, TSH with labs.      Relevant Orders   TSH   CBC with Differential/Platelet   B12   Right lower quadrant abdominal pain - Primary    Symptoms are improving CT was normal. Suspect bowel gas causing symptoms, given exacerbation with cruciferous  veggies. Will add beano before meals. Follow up in 4 weeks and prn.          Return in about 4 weeks (around 07/08/2015) for Recheck.

## 2015-06-10 NOTE — Assessment & Plan Note (Signed)
Chronic mild fatigue. Exam normal. Will check CBC, B12, TSH with labs.

## 2015-06-10 NOTE — Progress Notes (Signed)
Pre visit review using our clinic review tool, if applicable. No additional management support is needed unless otherwise documented below in the visit note. 

## 2015-06-11 ENCOUNTER — Encounter: Payer: Self-pay | Admitting: *Deleted

## 2015-06-11 LAB — CBC WITH DIFFERENTIAL/PLATELET
Basophils Absolute: 0 10*3/uL (ref 0.0–0.1)
Basophils Relative: 0.3 % (ref 0.0–3.0)
Eosinophils Absolute: 0.3 10*3/uL (ref 0.0–0.7)
Eosinophils Relative: 5.2 % — ABNORMAL HIGH (ref 0.0–5.0)
HCT: 38.1 % (ref 36.0–46.0)
Hemoglobin: 12.8 g/dL (ref 12.0–15.0)
Lymphocytes Relative: 28.4 % (ref 12.0–46.0)
Lymphs Abs: 1.4 10*3/uL (ref 0.7–4.0)
MCHC: 33.5 g/dL (ref 30.0–36.0)
MCV: 93.8 fl (ref 78.0–100.0)
Monocytes Absolute: 0.3 10*3/uL (ref 0.1–1.0)
Monocytes Relative: 6.7 % (ref 3.0–12.0)
Neutro Abs: 3 10*3/uL (ref 1.4–7.7)
Neutrophils Relative %: 59.4 % (ref 43.0–77.0)
Platelets: 205 10*3/uL (ref 150.0–400.0)
RBC: 4.06 Mil/uL (ref 3.87–5.11)
RDW: 12.4 % (ref 11.5–15.5)
WBC: 5 10*3/uL (ref 4.0–10.5)

## 2015-06-11 LAB — VITAMIN B12: Vitamin B-12: 767 pg/mL (ref 211–911)

## 2015-06-11 LAB — TSH: TSH: 2.49 u[IU]/mL (ref 0.35–4.50)

## 2015-06-17 DIAGNOSIS — M545 Low back pain: Secondary | ICD-10-CM | POA: Diagnosis not present

## 2015-06-24 ENCOUNTER — Encounter: Payer: Self-pay | Admitting: Internal Medicine

## 2015-06-24 ENCOUNTER — Ambulatory Visit (INDEPENDENT_AMBULATORY_CARE_PROVIDER_SITE_OTHER): Payer: Medicare Other | Admitting: Internal Medicine

## 2015-06-24 VITALS — BP 116/69 | HR 59 | Temp 98.4°F | Ht 66.5 in | Wt 151.5 lb

## 2015-06-24 DIAGNOSIS — M545 Low back pain, unspecified: Secondary | ICD-10-CM | POA: Insufficient documentation

## 2015-06-24 NOTE — Progress Notes (Signed)
   Subjective:    Patient ID: [name], female    DOB: 12-15-1940, 74 y.o.   MRN: [id number]  HPI  75YO female presents for follow up.  Seen by Dr. Christophe Louis for low back pain and DJD. Started on some exercises to help with back pain. Started on Meloxicam. Notes some improvement in symptoms. No new imaging performed.  Abdominal pain resolved after starting Beano. Continues eating vegetables without issue.  Started back exercising. Feeling better overall.  Past medical, surgical, family and social history per today's encounter.  Review of Systems  Constitutional: Negative for fever, chills, appetite change, fatigue and unexpected weight change.  Eyes: Negative for visual disturbance.  Respiratory: Negative for shortness of breath.   Cardiovascular: Negative for chest pain and leg swelling.  Gastrointestinal: Negative for abdominal pain.  Musculoskeletal: Positive for myalgias, back pain and arthralgias.  Skin: Negative for color change and rash.  Neurological: Negative for weakness and numbness.  Hematological: Negative for adenopathy. Does not bruise/bleed easily.  Psychiatric/Behavioral: Negative for sleep disturbance and dysphoric mood. The patient is not nervous/anxious.        Objective:    BP 116/69 mmHg  Pulse 59  Temp(Src) 98.4 F (36.9 C) (Oral)  Ht 5' 6.5" (1.689 m)  Wt 151 lb 8 oz (68.72 kg)  BMI 24.09 kg/m2  SpO2 96% Physical Exam  Constitutional: She is oriented to person, place, and time. She appears well-developed and well-nourished. No distress.  HENT:  Head: Normocephalic and atraumatic.  Right Ear: External ear normal.  Left Ear: External ear normal.  Nose: Nose normal.  Mouth/Throat: Oropharynx is clear and moist. No oropharyngeal exudate.  Eyes: Conjunctivae are normal. Pupils are equal, round, and reactive to light. Right eye exhibits no discharge. Left eye exhibits no discharge. No scleral icterus.  Neck: Normal range of motion.  Neck supple. No tracheal deviation present. No thyromegaly present.  Cardiovascular: Normal rate, regular rhythm, normal heart sounds and intact distal pulses.  Exam reveals no gallop and no friction rub.   No murmur heard. Pulmonary/Chest: Effort normal and breath sounds normal. No respiratory distress. She has no wheezes. She has no rales. She exhibits no tenderness.  Musculoskeletal: Normal range of motion. She exhibits no edema or tenderness.       Lumbar back: She exhibits pain. She exhibits normal range of motion, no tenderness, no edema, no deformity and no spasm.  Lymphadenopathy:    She has no cervical adenopathy.  Neurological: She is alert and oriented to person, place, and time. No cranial nerve deficit. She exhibits normal muscle tone. Coordination normal.  Skin: Skin is warm and dry. No rash noted. She is not diaphoretic. No erythema. No pallor.  Psychiatric: She has a normal mood and affect. Her behavior is normal. Judgment and thought content normal.          Assessment & Plan:   Problem List Items Addressed This Visit      Unprioritized   Low back pain - Primary    Low back pain 2/2 DJD. Symptoms improved with exercise and Meloxicam. Will continue.      Relevant Medications   meloxicam (MOBIC) 15 MG tablet       Return in about 4 weeks (around 07/22/2015) for Wellness Visit.

## 2015-06-24 NOTE — Progress Notes (Signed)
Pre visit review using our clinic review tool, if applicable. No additional management support is needed unless otherwise documented below in the visit note. 

## 2015-06-24 NOTE — Patient Instructions (Signed)
Continue current medications.  Follow up 4 weeks.

## 2015-06-24 NOTE — Assessment & Plan Note (Signed)
Low back pain 2/2 DJD. Symptoms improved with exercise and Meloxicam. Will continue.

## 2015-07-25 ENCOUNTER — Ambulatory Visit (INDEPENDENT_AMBULATORY_CARE_PROVIDER_SITE_OTHER): Payer: Medicare Other

## 2015-07-25 VITALS — BP 118/74 | HR 68 | Temp 98.2°F | Resp 14 | Ht 67.0 in | Wt 150.2 lb

## 2015-07-25 DIAGNOSIS — Z0000 Encounter for general adult medical examination without abnormal findings: Secondary | ICD-10-CM

## 2015-07-25 NOTE — Progress Notes (Signed)
Subjective:   [name] is a 74 y.o. female who presents for Medicare Annual (Subsequent) preventive examination.  Review of Systems:  No ROS.  Medicare Wellness Visit.  Cardiac Risk Factors include: advanced age (>52men, >48 women)     Objective:      The goal of the wellness visit is to assist the patient how to close the gaps in care and create a preventative care plan for the patient. This was a routine visit for [name].  Vitals: BP 118/74 mmHg  Pulse 68  Temp(Src) 98.2 F (36.8 C) (Oral)  Resp 14  Ht 5\' 7"  (1.702 m)  Wt 150 lb 3.2 oz (68.13 kg)  BMI 23.52 kg/m2  SpO2 98%  Tobacco History  Smoking status  . Former Smoker  Smokeless tobacco  . Never Used    Comment: Smoked x 1 year in her 20's     Counseling given: Not Answered   History reviewed. No pertinent past medical history. Past Surgical History  Procedure Laterality Date  . Abdominal hysterectomy      In 30's  . Vaginal prolapse repair  2014    Dr. Enzo Bi   Family History  Problem Relation Age of Onset  . Breast cancer Mother   . Heart attack Mother   . Stroke Father   . Colon cancer Neg Hx   . Pancreatic cancer Sister   . Cancer Brother   . Heart attack Brother   . Diabetes Brother    History  Sexual Activity  . Sexual Activity: No    Outpatient Encounter Prescriptions as of 07/25/2015  Medication Sig  . aspirin EC 81 MG tablet Take 81 mg by mouth daily.    . Calcium Carbonate-Vitamin D (CALCIUM-VITAMIN D) 500-200 MG-UNIT per tablet Take 1 tablet by mouth daily.  Javier Docker Oil 1000 MG CAPS Take by mouth daily.  Marland Kitchen levothyroxine (SYNTHROID, LEVOTHROID) 50 MCG tablet TAKE 1 TABLET (50 MCG TOTAL) BY MOUTH DAILY.  Marland Kitchen Misc Natural Products (OSTEO BI-FLEX JOINT SHIELD PO) Take 1 tablet by mouth daily.  . Multiple Vitamins-Minerals (MULTIVITAMIN WITH MINERALS) tablet Take 1 tablet by mouth daily.    . simvastatin (ZOCOR) 20 MG tablet Take 1 tablet (20 mg total) by mouth daily at 6  PM.  . meloxicam (MOBIC) 15 MG tablet TAKE 1 TABLET(S) EVERY DAY BY MOUTH WITH FOOD.   No facility-administered encounter medications on file as of 07/25/2015.    Activities of Daily Living In your present state of health, do you have any difficulty performing the following activities: 07/25/2015  Hearing? N  Vision? N  Difficulty concentrating or making decisions? N  Walking or climbing stairs? N  Dressing or bathing? N  Doing errands, shopping? N  Preparing Food and eating ? N  Using the Toilet? N  In the past six months, have you accidently leaked urine? N  Do you have problems with loss of bowel control? N  Managing your Medications? N  Managing your Finances? N  Housekeeping or managing your Housekeeping? N    Patient Care Team: Jackolyn Confer, MD as PCP - General (Internal Medicine)    Assessment:    Calcium and Vit D as appropriate/ Osteoporosis risk reviewed; Bone Density results updated and abstracted.  Taking meds without issues; no barriers identified.  High dose Influenza vaccine administered today.   Safety issues reviewed; smoke detectors in the home. No firearms in the home. Wears seatbelts when driving or riding with others. No violence in  the home.  No identified risk were noted; The patient was oriented x 3; appropriate in dress and manner and no objective failures at ADL's or IADL's. Follow up in 1 year unless need arises for a sick visit.  Ophthalmologist- Followed by [facility], Dr. Jeni Salles. Last visit 6 months ago.   Hearing screening- Failed L ear 500Hz , 2000Hz . No C/O hearing issues.  Neurosurgeon given.  End of life planning was discussed; aging in home or other; plans to return updated copy of HCPOA/Living Will.  Exercise Activities and Dietary recommendations Current Exercise Habits:: Structured exercise class, Type of exercise: treadmill;Other - see comments;walking (arm raising exercise), Time (Minutes): 60, Frequency  (Times/Week): 3, Weekly Exercise (Minutes/Week): 180, Intensity: Mild  Goals    . Increase water intake     Increase water intake up to 8 glasses daily.  Record/log amounts.        Fall Risk Fall Risk  07/25/2015 12/07/2013 11/20/2012  Falls in the past year? No No No   Depression Screen PHQ 2/9 Scores 07/25/2015 12/07/2013 11/20/2012  PHQ - 2 Score 0 0 0     Cognitive Testing MMSE - Mini Mental State Exam 07/25/2015  Orientation to time 5  Orientation to Place 5  Registration 3  Attention/ Calculation 5  Recall 3  Language- name 2 objects 2  Language- repeat 1  Language- follow 3 step command 3  Language- read & follow direction 1  Write a sentence 1  Copy design 1  Total score 30    Immunization History  Administered Date(s) Administered  . Influenza Split 08/18/2011, 08/04/2012  . Influenza Whole 09/10/2013  . Influenza, High Dose Seasonal PF 07/25/2015  . Influenza,inj,Quad PF,36+ Mos 09/06/2014  . Pneumococcal Conjugate-13 12/07/2013  . Pneumococcal Polysaccharide-23 12/18/2007  . Tdap 11/20/2012  . Zoster 12/18/2007   Screening Tests Health Maintenance  Topic Date Due  . INFLUENZA VACCINE  06/22/2016  . MAMMOGRAM  11/18/2016  . COLONOSCOPY  12/07/2021  . TETANUS/TDAP  11/20/2022  . DEXA SCAN  Completed  . ZOSTAVAX  Completed  . PNA vac Low Risk Adult  Completed      Plan:    During the course of the visit the patient was educated and counseled about the following appropriate screening and preventive services:   Vaccines to include Pneumoccal, Influenza, Hepatitis B, Td, Zostavax, HCV  Electrocardiogram  Cardiovascular Disease  Colorectal cancer screening  Bone density screening  Diabetes screening  Glaucoma screening  Mammography/PAP  Nutrition counseling   Patient Instructions (the written plan) was given to the patient.   Varney Biles, LPN  0/12/5425

## 2015-07-25 NOTE — Patient Instructions (Addendum)
[name],  Thank you for taking time to come for your Medicare Wellness Visit.  I appreciate your ongoing commitment to your health goals. Please review the following plan we discussed and let me know if I can assist you in the future.  Bring copy of HCPOA  Follow up in 1 year unless you need to make a sick visit   Hearing Loss A hearing loss is sometimes called deafness. Hearing loss may be partial or total. CAUSES Hearing loss may be caused by:  Wax in the ear canal.  Infection of the ear canal.  Infection of the middle ear.  Trauma to the ear or surrounding area.  Fluid in the middle ear.  A hole in the eardrum (perforated eardrum).  Exposure to loud sounds or music.  Problems with the hearing nerve.  Certain medications. Hearing loss without wax, infection, or a history of injury may mean that the nerve is involved. Hearing loss with severe dizziness, nausea and vomiting or ringing in the ear may suggest a hearing nerve irritation or problems in the middle or inner ear. If hearing loss is untreated, there is a greater likelihood for residual or permanent hearing loss. DIAGNOSIS A hearing test (audiometry) assesses hearing loss. The audiometry test needs to be performed by a hearing specialist (audiologist). TREATMENT Treatment for recent onset of hearing loss may include:  Ear wax removal.  Medications that kill germs (antibiotics).  Cortisone medications.  Prompt follow up with the appropriate specialist. Return of hearing depends on the cause of your hearing loss, so proper medical follow-up is important. Some hearing loss may not be reversible, and a caregiver should discuss care and treatment options with you. SEEK MEDICAL CARE IF:   You have a severe headache, dizziness, or changes in vision.  You have new or increased weakness.  You develop repeated vomiting or other serious medical problems.  You have a fever. Document Released: 11/08/2005 Document  Revised: 01/31/2012 Document Reviewed: 03/05/2010 [facility] Patient Information 2015 [place], [place]. This information is not intended to replace advice given to you by your health care provider. Make sure you discuss any questions you have with your health care provider.

## 2015-07-28 NOTE — Progress Notes (Signed)
Annual Wellness Visit as completed by Health Coach was reviewed in full.  

## 2015-08-30 ENCOUNTER — Other Ambulatory Visit: Payer: Self-pay | Admitting: Internal Medicine

## 2015-09-06 ENCOUNTER — Other Ambulatory Visit: Payer: Self-pay | Admitting: Internal Medicine

## 2015-09-10 ENCOUNTER — Encounter: Payer: Self-pay | Admitting: Family Medicine

## 2015-09-10 ENCOUNTER — Ambulatory Visit (INDEPENDENT_AMBULATORY_CARE_PROVIDER_SITE_OTHER): Payer: Medicare Other | Admitting: Family Medicine

## 2015-09-10 DIAGNOSIS — B9620 Unspecified Escherichia coli [E. coli] as the cause of diseases classified elsewhere: Secondary | ICD-10-CM

## 2015-09-10 DIAGNOSIS — N390 Urinary tract infection, site not specified: Secondary | ICD-10-CM | POA: Insufficient documentation

## 2015-09-10 DIAGNOSIS — R319 Hematuria, unspecified: Secondary | ICD-10-CM

## 2015-09-10 HISTORY — DX: Urinary tract infection, site not specified: N39.0

## 2015-09-10 HISTORY — DX: Unspecified Escherichia coli (E. coli) as the cause of diseases classified elsewhere: B96.20

## 2015-09-10 LAB — POCT URINALYSIS DIPSTICK
Bilirubin, UA: NEGATIVE
Glucose, UA: NEGATIVE
Ketones, UA: NEGATIVE
Nitrite, UA: POSITIVE
Protein, UA: 100
Spec Grav, UA: 1.02
Urobilinogen, UA: 0.2
pH, UA: 7

## 2015-09-10 MED ORDER — CIPROFLOXACIN HCL 500 MG PO TABS: 500.0000 mg | ORAL_TABLET | Freq: Two times a day (BID) | ORAL | Status: DC

## 2015-09-10 NOTE — Progress Notes (Signed)
Subjective:  Patient ID: [name], female    DOB: May 05, 1941  Age: 74 y.o. MRN: [id number]  CC: ? UTI  HPI:  74 year old female presents to clinic today for an acute visit with complaints of possible UTI.  Patient reports that she developed pain with urination, urgency, and frequency last night.  She also reports that she's had hematuria and some low back pain. No associated fever but she does report chills. She's been taking Azo with some relief. No exacerbating factors. No other complaints today.  Social Hx   Social History   Social History  . Marital Status: Married    Spouse Name: N/A  . Number of Children: 2  . Years of Education: N/A   Occupational History  . Retired Education officer, museum    Social History Main Topics  . Smoking status: Former Research scientist (life sciences)  . Smokeless tobacco: Never Used     Comment: Smoked x 1 year in her 45's  . Alcohol Use: No  . Drug Use: No  . Sexual Activity: No   Other Topics Concern  . None   Social History Narrative   Daily Caffeine Use:  One cup coffee in am    Regular Exercise -  5 days a week x 1 hour - gym, aerobic and wt training          Review of Systems  Constitutional: Positive for chills. Negative for fever.  Genitourinary: Positive for dysuria, urgency and frequency.   Objective:  BP 144/96 mmHg  Pulse 68  Temp(Src) 98.6 F (37 C) (Oral)  Ht 5' 6.5" (1.689 m)  Wt 147 lb 8 oz (66.906 kg)  BMI 23.45 kg/m2  SpO2 98%  BP/Weight 09/10/2015 01/28/1016 03/22/257  Systolic BP 527 782 423  Diastolic BP 96 74 69  Wt. (Lbs) 147.5 150.2 151.5  BMI 23.45 23.52 24.09   Physical Exam  Constitutional: She is oriented to person, place, and time. She appears well-developed and well-nourished. No distress.  Cardiovascular: Normal rate and regular rhythm.   Pulmonary/Chest: Effort normal. No respiratory distress. She has no wheezes. She has no rales.  Abdominal: Soft.  Mild suprapubic tenderness. No rebound or guarding. Negative CVA  tenderness.  Neurological: She is alert and oriented to person, place, and time.  Psychiatric: She has a normal mood and affect.  Vitals reviewed.  Results for orders placed or performed in visit on 09/10/15 (from the past 24 hour(s))  POCT urinalysis dipstick     Status: Abnormal   Collection Time: 09/10/15 10:15 AM  Result Value Ref Range   Color, UA dark yellow    Clarity, UA cloudy    Glucose, UA neg    Bilirubin, UA neg    Ketones, UA neg    Spec Grav, UA 1.020    Blood, UA large    pH, UA 7.0    Protein, UA 100    Urobilinogen, UA 0.2    Nitrite, UA pos    Leukocytes, UA large (3+) (A) Negative   Assessment & Plan:   Problem List Items Addressed This Visit    UTI (urinary tract infection)    Symptoms and Urinalysis consistent with UTI. Treating with Cipro. Sending for culture      Relevant Medications   ciprofloxacin (CIPRO) 500 MG tablet   Other Relevant Orders   POCT urinalysis dipstick (Completed)   Urine culture      Meds ordered this encounter  Medications  . ciprofloxacin (CIPRO) 500 MG tablet  Sig: Take 1 tablet (500 mg total) by mouth 2 (two) times daily.    Dispense:  14 tablet    Refill:  0    Follow-up: PRN  Thersa Salt, DO

## 2015-09-10 NOTE — Assessment & Plan Note (Signed)
Symptoms and Urinalysis consistent with UTI. Treating with Cipro. Sending for culture

## 2015-09-10 NOTE — Patient Instructions (Signed)
Take antibiotics as prescribed.  Please call if you fail to improve or worsen.  Take care  Dr. Lacinda Axon

## 2015-09-10 NOTE — Progress Notes (Signed)
Pre visit review using our clinic review tool, if applicable. No additional management support is needed unless otherwise documented below in the visit note. 

## 2015-09-12 LAB — URINE CULTURE: Colony Count: >=100000

## 2015-09-17 ENCOUNTER — Encounter: Payer: Self-pay | Admitting: Internal Medicine

## 2015-09-17 ENCOUNTER — Ambulatory Visit (INDEPENDENT_AMBULATORY_CARE_PROVIDER_SITE_OTHER): Payer: Medicare Other | Admitting: Internal Medicine

## 2015-09-17 VITALS — BP 138/73 | HR 58 | Temp 98.0°F | Ht 66.5 in | Wt 146.5 lb

## 2015-09-17 DIAGNOSIS — Z800 Family history of malignant neoplasm of digestive organs: Secondary | ICD-10-CM | POA: Diagnosis not present

## 2015-09-17 DIAGNOSIS — R030 Elevated blood-pressure reading, without diagnosis of hypertension: Secondary | ICD-10-CM | POA: Diagnosis not present

## 2015-09-17 DIAGNOSIS — IMO0001 Reserved for inherently not codable concepts without codable children: Secondary | ICD-10-CM | POA: Insufficient documentation

## 2015-09-17 DIAGNOSIS — H9312 Tinnitus, left ear: Secondary | ICD-10-CM

## 2015-09-17 DIAGNOSIS — H9319 Tinnitus, unspecified ear: Secondary | ICD-10-CM | POA: Insufficient documentation

## 2015-09-17 MED ORDER — MELOXICAM 15 MG PO TABS: ORAL_TABLET | ORAL | Status: DC

## 2015-09-17 NOTE — Progress Notes (Signed)
Pre visit review using our clinic review tool, if applicable. No additional management support is needed unless otherwise documented below in the visit note. 

## 2015-09-17 NOTE — Assessment & Plan Note (Signed)
BP Readings from Last 3 Encounters:  09/17/15 138/73  09/10/15 144/96  07/25/15 118/74   BP well controlled today in office. Suspect elevated BP related to stress of caring for husband and acute anxiety. Will monitor BP at home and follow up here in 4 weeks for recheck.

## 2015-09-17 NOTE — Assessment & Plan Note (Signed)
Last colonoscopy 2013. May need earlier follow up given her sister's diagnosis of colon cancer. Will set up follow up with GI. Also discussed potential benefits of aspirin for prevention of colon cancer.

## 2015-09-17 NOTE — Progress Notes (Signed)
Subjective:    Patient ID: [name], female    DOB: 07/17/1941, 74 y.o.   MRN: [id number]  HPI  74YO female presents for follow up.  Elevated BP - 160/90 when checked by home health nurse. Then up and down at home. Elevated when upset about issues with her husband. No CP or headache.  Left ear ringing - Over last few weeks. After starting Cipro. No ear pain, congestion. No change in hearing.  Wt Readings from Last 3 Encounters:  09/17/15 146 lb 8 oz (66.452 kg)  09/10/15 147 lb 8 oz (66.906 kg)  07/25/15 150 lb 3.2 oz (68.13 kg)   BP Readings from Last 3 Encounters:  09/17/15 138/73  09/10/15 144/96  07/25/15 118/74    No past medical history on file. Family History  Problem Relation Age of Onset  . Breast cancer Mother   . Heart attack Mother   . Stroke Father   . Colon cancer Neg Hx   . Pancreatic cancer Sister   . Cancer Brother   . Heart attack Brother   . Diabetes Brother    Past Surgical History  Procedure Laterality Date  . Abdominal hysterectomy      In 30's  . Vaginal prolapse repair  2014    Dr. Enzo Bi   Social History   Social History  . Marital Status: Married    Spouse Name: N/A  . Number of Children: 2  . Years of Education: N/A   Occupational History  . Retired Education officer, museum    Social History Main Topics  . Smoking status: Former Research scientist (life sciences)  . Smokeless tobacco: Never Used     Comment: Smoked x 1 year in her 20's  . Alcohol Use: No  . Drug Use: No  . Sexual Activity: No   Other Topics Concern  . None   Social History Narrative   Daily Caffeine Use:  One cup coffee in am    Regular Exercise -  5 days a week x 1 hour - gym, aerobic and wt training          Review of Systems  Constitutional: Negative for fever, chills, appetite change, fatigue and unexpected weight change.  HENT: Positive for tinnitus. Negative for congestion, ear discharge, ear pain, postnasal drip, rhinorrhea, sore throat, trouble swallowing and voice  change.   Eyes: Negative for visual disturbance.  Respiratory: Negative for shortness of breath.   Cardiovascular: Negative for chest pain, palpitations and leg swelling.  Gastrointestinal: Negative for nausea, vomiting, abdominal pain, diarrhea and constipation.  Skin: Negative for color change and rash.  Neurological: Negative for headaches.  Hematological: Negative for adenopathy. Does not bruise/bleed easily.  Psychiatric/Behavioral: Negative for suicidal ideas, sleep disturbance and dysphoric mood. The patient is not nervous/anxious.        Objective:    BP 138/73 mmHg  Pulse 58  Temp(Src) 98 F (36.7 C) (Oral)  Ht 5' 6.5" (1.689 m)  Wt 146 lb 8 oz (66.452 kg)  BMI 23.29 kg/m2  SpO2 100% Physical Exam  Constitutional: She is oriented to person, place, and time. She appears well-developed and well-nourished. No distress.  HENT:  Head: Normocephalic and atraumatic.  Right Ear: Tympanic membrane, external ear and ear canal normal.  Left Ear: Tympanic membrane, external ear and ear canal normal.  Nose: Nose normal.  Mouth/Throat: Oropharynx is clear and moist. No oropharyngeal exudate.  Eyes: Conjunctivae are normal. Pupils are equal, round, and reactive to light. Right eye exhibits no discharge.  Left eye exhibits no discharge. No scleral icterus.  Neck: Normal range of motion. Neck supple. No tracheal deviation present. No thyromegaly present.  Cardiovascular: Normal rate, regular rhythm, normal heart sounds and intact distal pulses.  Exam reveals no gallop and no friction rub.   No murmur heard. Pulmonary/Chest: Effort normal and breath sounds normal. No respiratory distress. She has no wheezes. She has no rales. She exhibits no tenderness.  Musculoskeletal: Normal range of motion. She exhibits no edema or tenderness.  Lymphadenopathy:    She has no cervical adenopathy.  Neurological: She is alert and oriented to person, place, and time. No cranial nerve deficit. She exhibits  normal muscle tone. Coordination normal.  Skin: Skin is warm and dry. No rash noted. She is not diaphoretic. No erythema. No pallor.  Psychiatric: She has a normal mood and affect. Her behavior is normal. Judgment and thought content normal.          Assessment & Plan:   Problem List Items Addressed This Visit      Unprioritized   Elevated BP - Primary    BP Readings from Last 3 Encounters:  09/17/15 138/73  09/10/15 144/96  07/25/15 118/74   BP well controlled today in office. Suspect elevated BP related to stress of caring for husband and acute anxiety. Will monitor BP at home and follow up here in 4 weeks for recheck.      Family history of colon cancer    Last colonoscopy 2013. May need earlier follow up given her sister's diagnosis of colon cancer. Will set up follow up with GI. Also discussed potential benefits of aspirin for prevention of colon cancer.      Relevant Orders   Ambulatory referral to Gastroenterology   Tinnitus    Left ear tinnitus. Suspect this may be related to use of Cipro recently. Exam is normal today. Follow up if symptoms are not improving and we will set up ENT evaluation.          Return in about 4 weeks (around 10/15/2015) for Recheck.

## 2015-09-17 NOTE — Assessment & Plan Note (Signed)
Left ear tinnitus. Suspect this may be related to use of Cipro recently. Exam is normal today. Follow up if symptoms are not improving and we will set up ENT evaluation.

## 2015-09-17 NOTE — Patient Instructions (Addendum)
Check blood pressure 2-3 times per week and call with readings.  Call if ringing in left ear is not improved over next 1-2 weeks.

## 2015-10-15 DIAGNOSIS — H16223 Keratoconjunctivitis sicca, not specified as Sjogren's, bilateral: Secondary | ICD-10-CM | POA: Diagnosis not present

## 2015-10-15 DIAGNOSIS — Z961 Presence of intraocular lens: Secondary | ICD-10-CM | POA: Diagnosis not present

## 2015-10-22 ENCOUNTER — Ambulatory Visit: Payer: Medicare Other | Admitting: Internal Medicine

## 2015-11-03 ENCOUNTER — Ambulatory Visit (INDEPENDENT_AMBULATORY_CARE_PROVIDER_SITE_OTHER): Payer: Medicare Other | Admitting: Internal Medicine

## 2015-11-03 ENCOUNTER — Ambulatory Visit
Admission: RE | Admit: 2015-11-03 | Discharge: 2015-11-03 | Disposition: A | Discharge status: Home / Self Care | Payer: Medicare Other | Source: Ambulatory Visit | Attending: Internal Medicine | Admitting: Internal Medicine

## 2015-11-03 ENCOUNTER — Encounter: Payer: Self-pay | Admitting: Internal Medicine

## 2015-11-03 VITALS — BP 133/76 | HR 51 | Temp 98.2°F | Ht 67.0 in | Wt 148.4 lb

## 2015-11-03 DIAGNOSIS — M47812 Spondylosis without myelopathy or radiculopathy, cervical region: Secondary | ICD-10-CM | POA: Insufficient documentation

## 2015-11-03 DIAGNOSIS — R221 Localized swelling, mass and lump, neck: Secondary | ICD-10-CM | POA: Diagnosis not present

## 2015-11-03 DIAGNOSIS — M7989 Other specified soft tissue disorders: Secondary | ICD-10-CM | POA: Diagnosis not present

## 2015-11-03 DIAGNOSIS — I517 Cardiomegaly: Secondary | ICD-10-CM | POA: Insufficient documentation

## 2015-11-03 DIAGNOSIS — R222 Localized swelling, mass and lump, trunk: Secondary | ICD-10-CM | POA: Insufficient documentation

## 2015-11-03 DIAGNOSIS — H9312 Tinnitus, left ear: Secondary | ICD-10-CM

## 2015-11-03 DIAGNOSIS — R0789 Other chest pain: Secondary | ICD-10-CM | POA: Insufficient documentation

## 2015-11-03 DIAGNOSIS — R079 Chest pain, unspecified: Secondary | ICD-10-CM | POA: Diagnosis not present

## 2015-11-03 NOTE — Progress Notes (Signed)
Pre visit review using our clinic review tool, if applicable. No additional management support is needed unless otherwise documented below in the visit note. 

## 2015-11-03 NOTE — Assessment & Plan Note (Signed)
Right clavicular fullness, likely normal asymmetry. Will however, get CXR to evaluate bony structure given recent change.

## 2015-11-03 NOTE — Assessment & Plan Note (Addendum)
"  Crushing" substernal chest pain which radiated to jaw on one occasion. EKG today shows lateral t-wave inversion. Will set up further evaluation with cardiology for possible repeat stress test.

## 2015-11-03 NOTE — Assessment & Plan Note (Signed)
No improvement. Will set up ENT evaluation.

## 2015-11-03 NOTE — Patient Instructions (Addendum)
Chest xray today at [facility].  We will set up cardiology evaluation for chest pain. If any further episodes of chest pain, you will need urgent evaluation through the ER.  Follow up in 4 weeks.

## 2015-11-03 NOTE — Progress Notes (Signed)
Subjective:    Patient ID: [name], female    DOB: Oct 01, 1941, 74 y.o.   MRN: [id number]  HPI  74YO female presents for follow up.  Recently seen for elevated BP.  Noticed swelling or protrusion of right clavicle about 1 month ago. No known injury to site. No dyspnea chest pain or cough.  Continues to have ringing in left ear. No pain in ear. No changes in hearing.  On Thanksgiving day, had sudden onset of "crushing" chest pain while driving. This radiated to her jaw. Was driving in heavy traffic at time. No dyspnea. No nausea. Symptoms resolved after about 87min. Had stress test about 40 years ago as workup for syncope, but nothing recently.  Wt Readings from Last 3 Encounters:  11/03/15 148 lb 6 oz (67.302 kg)  09/17/15 146 lb 8 oz (66.452 kg)  09/10/15 147 lb 8 oz (66.906 kg)   BP Readings from Last 3 Encounters:  11/03/15 133/76  09/17/15 138/73  09/10/15 144/96    No past medical history on file. Family History  Problem Relation Age of Onset  . Breast cancer Mother   . Heart attack Mother   . Stroke Father   . Colon cancer Neg Hx   . Pancreatic cancer Sister   . Cancer Brother   . Heart attack Brother   . Diabetes Brother    Past Surgical History  Procedure Laterality Date  . Abdominal hysterectomy      In 30's  . Vaginal prolapse repair  2014    Dr. Enzo Bi   Social History   Social History  . Marital Status: Married    Spouse Name: N/A  . Number of Children: 2  . Years of Education: N/A   Occupational History  . Retired Education officer, museum    Social History Main Topics  . Smoking status: Former Research scientist (life sciences)  . Smokeless tobacco: Never Used     Comment: Smoked x 1 year in her 62's  . Alcohol Use: No  . Drug Use: No  . Sexual Activity: No   Other Topics Concern  . None   Social History Narrative   Daily Caffeine Use:  One cup coffee in am    Regular Exercise -  5 days a week x 1 hour - gym, aerobic and wt training          Review of  Systems  Constitutional: Negative for fever, chills, appetite change, fatigue and unexpected weight change.  HENT: Positive for tinnitus. Negative for congestion, postnasal drip and rhinorrhea.   Eyes: Negative for visual disturbance.  Respiratory: Negative for cough, shortness of breath and wheezing.   Cardiovascular: Positive for chest pain. Negative for palpitations and leg swelling.  Gastrointestinal: Negative for nausea, vomiting, abdominal pain, diarrhea and constipation.  Musculoskeletal: Positive for joint swelling. Negative for myalgias and arthralgias.  Skin: Negative for color change and rash.  Hematological: Negative for adenopathy. Does not bruise/bleed easily.  Psychiatric/Behavioral: Negative for suicidal ideas, sleep disturbance and dysphoric mood. The patient is nervous/anxious.        Objective:    BP 133/76 mmHg  Pulse 51  Temp(Src) 98.2 F (36.8 C) (Oral)  Ht 5\' 7"  (1.702 m)  Wt 148 lb 6 oz (67.302 kg)  BMI 23.23 kg/m2  SpO2 98% Physical Exam  Constitutional: She is oriented to person, place, and time. She appears well-developed and well-nourished. No distress.  HENT:  Head: Normocephalic and atraumatic.  Right Ear: External ear normal.  Left Ear: External  ear normal.  Nose: Nose normal.  Mouth/Throat: Oropharynx is clear and moist. No oropharyngeal exudate.  Eyes: Conjunctivae are normal. Pupils are equal, round, and reactive to light. Right eye exhibits no discharge. Left eye exhibits no discharge. No scleral icterus.  Neck: Normal range of motion. Neck supple. No tracheal deviation present. No thyromegaly present.  Cardiovascular: Normal rate, regular rhythm, normal heart sounds and intact distal pulses.  Exam reveals no gallop and no friction rub.   No murmur heard. Pulmonary/Chest: Effort normal and breath sounds normal. No accessory muscle usage. No respiratory distress. She has no decreased breath sounds. She has no wheezes. She has no rhonchi. She has  no rales. She exhibits no tenderness.    Musculoskeletal: Normal range of motion. She exhibits no edema or tenderness.  Lymphadenopathy:    She has no cervical adenopathy.  Neurological: She is alert and oriented to person, place, and time. No cranial nerve deficit. She exhibits normal muscle tone. Coordination normal.  Skin: Skin is warm and dry. No rash noted. She is not diaphoretic. No erythema. No pallor.  Psychiatric: She has a normal mood and affect. Her behavior is normal. Judgment and thought content normal.          Assessment & Plan:   Problem List Items Addressed This Visit      Unprioritized   Clavicular area fullness    Right clavicular fullness, likely normal asymmetry. Will however, get CXR to evaluate bony structure given recent change.      Relevant Orders   DG Chest 2 View   Elevated BP   Pain in the chest - Primary    "Crushing" substernal chest pain which radiated to jaw on one occasion. EKG today shows lateral t-wave inversion. Will set up further evaluation with cardiology for possible repeat stress test.      Relevant Orders   EKG 12-Lead (Completed)   Ambulatory referral to Cardiology   Tinnitus    No improvement. Will set up ENT evaluation.      Relevant Orders   Ambulatory referral to ENT       Return in about 4 weeks (around 12/01/2015) for Recheck.

## 2015-11-04 ENCOUNTER — Ambulatory Visit (INDEPENDENT_AMBULATORY_CARE_PROVIDER_SITE_OTHER): Payer: Medicare Other | Admitting: Cardiovascular Disease

## 2015-11-04 ENCOUNTER — Encounter: Payer: Self-pay | Admitting: Cardiovascular Disease

## 2015-11-04 ENCOUNTER — Encounter (INDEPENDENT_AMBULATORY_CARE_PROVIDER_SITE_OTHER): Payer: Self-pay

## 2015-11-04 VITALS — BP 148/70 | HR 78 | Ht 67.0 in | Wt 148.2 lb

## 2015-11-04 DIAGNOSIS — R0789 Other chest pain: Secondary | ICD-10-CM | POA: Diagnosis not present

## 2015-11-04 DIAGNOSIS — I493 Ventricular premature depolarization: Secondary | ICD-10-CM

## 2015-11-04 DIAGNOSIS — R079 Chest pain, unspecified: Secondary | ICD-10-CM | POA: Insufficient documentation

## 2015-11-04 DIAGNOSIS — R9431 Abnormal electrocardiogram [ECG] [EKG]: Secondary | ICD-10-CM | POA: Diagnosis not present

## 2015-11-04 DIAGNOSIS — E7849 Other hyperlipidemia: Secondary | ICD-10-CM

## 2015-11-04 DIAGNOSIS — E785 Hyperlipidemia, unspecified: Secondary | ICD-10-CM

## 2015-11-04 NOTE — Patient Instructions (Addendum)
Medication Instructions:  Your physician recommends that you continue on your current medications as directed. Please refer to the Current Medication list given to you today.   Labwork: none  Testing/Procedures: Your physician has requested that you have a lexiscan myoview. For further information please visit [email or web address]. Please follow instruction sheet, as given.  [place]  Your caregiver has ordered a Stress Test with nuclear imaging. The purpose of this test is to evaluate the blood supply to your heart muscle. This procedure is referred to as a "Non-Invasive Stress Test." This is because other than having an IV started in your vein, nothing is inserted or "invades" your body. Cardiac stress tests are done to find areas of poor blood flow to the heart by determining the extent of coronary artery disease (CAD). Some patients exercise on a treadmill, which naturally increases the blood flow to your heart, while others who are  unable to walk on a treadmill due to physical limitations have a pharmacologic/chemical stress agent called Lexiscan . This medicine will mimic walking on a treadmill by temporarily increasing your coronary blood flow.   Please note: these test may take anywhere between 2-4 hours to complete  PLEASE REPORT TO [facility] AT THE FIRST DESK WILL DIRECT YOU WHERE TO GO  Date of Procedure: Friday, Dec 16, 9:30am  Arrival Time for Procedure: 9:15am Instructions regarding medication: You may take your morning medications with a sip of water.    PLEASE NOTIFY THE OFFICE AT LEAST 80 HOURS IN ADVANCE IF YOU ARE UNABLE TO KEEP YOUR APPOINTMENT.  [phone] AND  PLEASE NOTIFY NUCLEAR MEDICINE AT [facility] AT LEAST 24 HOURS IN ADVANCE IF YOU ARE UNABLE TO KEEP YOUR APPOINTMENT. [phone]  How to prepare for your Myoview test:   Do not eat or drink after midnight  No caffeine for 24 hours prior to test  No smoking 24 hours  prior to test.  Your medication may be taken with water.  If your doctor stopped a medication because of this test, do not take that medication.  Ladies, please do not wear dresses.  Skirts or pants are appropriate. Please wear a short sleeve shirt.  No perfume, cologne or lotion.  Wear comfortable walking shoes. No heels!            Follow-Up: Your physician recommends that you schedule a follow-up appointment in: one month with Dr. Fletcher Anon.    Any Other Special Instructions Will Be Listed Below (If Applicable).     If you need a refill on your cardiac medications before your next appointment, please call your pharmacy.  Cardiac Nuclear Scanning A cardiac nuclear scan is used to check your heart for problems, such as the following:  A portion of the heart is not getting enough blood.  Part of the heart muscle has died, which happens with a heart attack.  The heart wall is not working normally.  In this test, a radioactive dye (tracer) is injected into your bloodstream. After the tracer has traveled to your heart, a scanning device is used to measure how much of the tracer is absorbed by or distributed to various areas of your heart. LET [facility] CARE PROVIDER KNOW ABOUT:  Any allergies you have.  All medicines you are taking, including vitamins, herbs, eye drops, creams, and over-the-counter medicines.  Previous problems you or members of your family have had with the use of anesthetics.  Any blood disorders you have.  Previous surgeries you  have had.  Medical conditions you have.  RISKS AND COMPLICATIONS Generally, this is a safe procedure. However, as with any procedure, problems can occur. Possible problems include:   Serious chest pain.  Rapid heartbeat.  Sensation of warmth in your chest. This usually passes quickly. BEFORE THE PROCEDURE Ask your health care provider about changing or stopping your regular medicines. PROCEDURE This procedure is  usually done at a hospital and takes 2-4 hours.  An IV tube is inserted into one of your veins.  Your health care provider will inject a small amount of radioactive tracer through the tube.  You will then wait for 20-40 minutes while the tracer travels through your bloodstream.  You will lie down on an exam table so images of your heart can be taken. Images will be taken for about 15-20 minutes.  You will exercise on a treadmill or stationary bike. While you exercise, your heart activity will be monitored with an electrocardiogram (ECG), and your blood pressure will be checked.  If you are unable to exercise, you may be given a medicine to make your heart beat faster.  When blood flow to your heart has peaked, tracer will again be injected through the IV tube.  After 20-40 minutes, you will get back on the exam table and have more images taken of your heart.  When the procedure is over, your IV tube will be removed. AFTER THE PROCEDURE  You will likely be able to leave shortly after the test. Unless your health care provider tells you otherwise, you may return to your normal schedule, including diet, activities, and medicines.  Make sure you find out how and when you will get your test results.   This information is not intended to replace advice given to you by your health care provider. Make sure you discuss any questions you have with your health care provider.   Document Released: 12/03/2004 Document Revised: 11/13/2013 Document Reviewed: 10/17/2013 Elsevier Interactive Patient Education [employer].

## 2015-11-04 NOTE — Assessment & Plan Note (Signed)
Cardiac ischemia has to be ruled out as outlined above. She does complain of mild palpitations. If stress test is low risk, I might treat her with a beta blocker especially that her blood pressure is also elevated.

## 2015-11-04 NOTE — Progress Notes (Signed)
Primary care physician: Dr. Anderson Malta walker  HPI  This is a pleasant 74 year old female who is here today for evaluation of chest pain. She has no previous cardiac history. She reports having cardiac workup about 40 years ago for syncope which included a stress test and was unremarkable. She has chronic medical conditions that include hyperlipidemia. She has no history of hypertension, diabetes or tobacco use. Her mother had myocardial infarction at the age of 31 and her father had a stroke later in life. Otherwise no family history of premature coronary artery disease. She had one episode of substernal chest tightness radiating to her jaw on Thanksgiving day while she was driving with her husband. It lasted for about 15 minutes and she did not seek medical attention. She stays very active and goes to the gym on a regular basis. She went to the gym after that episode and did have usually exercise with no recurrent symptoms. She has been feeling tired over the last 4 months. She reports being under stress lately as she is the caregiver of her husband who had a stroke 4 years ago.  Allergies  Allergen Reactions  . Prednisone     "crazy"     Current Outpatient Prescriptions on File Prior to Visit  Medication Sig Dispense Refill  . aspirin EC 81 MG tablet Take 81 mg by mouth daily.      Marland Kitchen levothyroxine (SYNTHROID, LEVOTHROID) 50 MCG tablet TAKE 1 TABLET (50 MCG TOTAL) BY MOUTH DAILY. 90 tablet 1  . meloxicam (MOBIC) 15 MG tablet TAKE 1 TABLET(S) EVERY DAY AS NEEDED BY MOUTH WITH FOOD. 30 tablet 3  . Misc Natural Products (OSTEO BI-FLEX JOINT SHIELD PO) Take 1 tablet by mouth daily.    . Multiple Vitamins-Minerals (MULTIVITAMIN WITH MINERALS) tablet Take 1 tablet by mouth daily.      . simvastatin (ZOCOR) 20 MG tablet TAKE 1 TABLET (20 MG TOTAL) BY MOUTH DAILY AT 6 PM. 90 tablet 3   No current facility-administered medications on file prior to visit.     Past Medical History  Diagnosis Date    . Hyperlipidemia   . Thyroid disease      Past Surgical History  Procedure Laterality Date  . Abdominal hysterectomy      In 30's  . Vaginal prolapse repair  2014    Dr. Enzo Bi     Family History  Problem Relation Age of Onset  . Breast cancer Mother   . Heart attack Mother   . Stroke Father   . Colon cancer Neg Hx   . Pancreatic cancer Sister   . Cancer Brother   . Heart attack Brother   . Diabetes Brother      Social History   Social History  . Marital Status: Married    Spouse Name: N/A  . Number of Children: 2  . Years of Education: N/A   Occupational History  . Retired Education officer, museum    Social History Main Topics  . Smoking status: Former Research scientist (life sciences)  . Smokeless tobacco: Never Used     Comment: Smoked x 1 year in her 49's  . Alcohol Use: No  . Drug Use: No  . Sexual Activity: No   Other Topics Concern  . Not on file   Social History Narrative   Daily Caffeine Use:  One cup coffee in am    Regular Exercise -  5 days a week x 1 hour - gym, aerobic and wt training  ROS A 10 point review of system was performed. It is negative other than that mentioned in the history of present illness.   PHYSICAL EXAM   BP 148/70 mmHg  Pulse 78  Ht 5\' 7"  (1.702 m)  Wt 148 lb 4 oz (67.246 kg)  BMI 23.21 kg/m2  Constitutional: She is oriented to person, place, and time. She appears well-developed and well-nourished. No distress.  HENT: No nasal discharge.  Head: Normocephalic and atraumatic.  Eyes: Pupils are equal and round. No discharge.  Neck: Normal range of motion. Neck supple. No JVD present. No thyromegaly present.  Cardiovascular: Normal rate, regular rhythm, normal heart sounds. Exam reveals no gallop and no friction rub. No murmur heard.  Pulmonary/Chest: Effort normal and breath sounds normal. No stridor. No respiratory distress. She has no wheezes. She has no rales. She exhibits no tenderness.  Abdominal: Soft. Bowel sounds are  normal. She exhibits no distension. There is no tenderness. There is no rebound and no guarding.  Musculoskeletal: Normal range of motion. She exhibits no edema and no tenderness.  Neurological: She is alert and oriented to person, place, and time. Coordination normal.  Skin: Skin is warm and dry. No rash noted. She is not diaphoretic. No erythema. No pallor.  Psychiatric: She has a normal mood and affect. Her behavior is normal. Judgment and thought content normal.    EKG: Normal sinus rhythm with PVCs. T wave inversion in the anterior leads suggestive of ischemia.   ASSESSMENT AND PLAN

## 2015-11-04 NOTE — Assessment & Plan Note (Signed)
The patient had one recent episode of chest pain which sounded anginal in nature. However, she had no recurrent symptoms since then even with physical activities. Her EKG is abnormal and suggestive of anterior wall ischemia and she is also noted to have frequent PVCs. Thus, I recommend urgent evaluation with a treadmill nuclear stress test. Given her PVCs and EKG changes, a treadmill stress test alone is not sufficient. I will have a low threshold for cardiac catheterization given the above concerns.

## 2015-11-04 NOTE — Assessment & Plan Note (Signed)
Lab Results  Component Value Date   CHOL 184 09/03/2014   HDL 47.00 09/03/2014   LDLCALC 109* 09/03/2014   LDLDIRECT 202.9 03/02/2013   TRIG 141.0 09/03/2014   CHOLHDL 4 09/03/2014   Previous LDL was 202 but improved to 109 on simvastatin. If cardiac testing reveals coronary artery disease, then a more potent statin will be needed to improve LDL to below 70.

## 2015-11-06 ENCOUNTER — Telehealth: Payer: Self-pay

## 2015-11-06 NOTE — Telephone Encounter (Signed)
Reviewed myoview instructions w/pt who verbalized understanding. Pt had no questions and is agreeable w/plan

## 2015-11-07 ENCOUNTER — Encounter
Admission: RE | Admit: 2015-11-07 | Discharge: 2015-11-07 | Disposition: A | Discharge status: Home / Self Care | Payer: Medicare Other | Source: Ambulatory Visit | Attending: Cardiovascular Disease | Admitting: Cardiovascular Disease

## 2015-11-07 DIAGNOSIS — R0789 Other chest pain: Secondary | ICD-10-CM | POA: Diagnosis not present

## 2015-11-07 DIAGNOSIS — R079 Chest pain, unspecified: Secondary | ICD-10-CM | POA: Diagnosis not present

## 2015-11-07 LAB — NM MYOCAR MULTI W/SPECT W/WALL MOTION / EF
Estimated workload: 7 METS
Exercise duration (min): 6 min
LV dias vol: 78 mL
LV sys vol: 39 mL
Peak HR: 130 {beats}/min
Percent HR: 89 %
Rest HR: 68 {beats}/min
SDS: 0
SRS: 0
SSS: 0
TID: 0.93

## 2015-11-07 MED ORDER — TECHNETIUM TC 99M SESTAMIBI - CARDIOLITE
29.4700 | Freq: Once | INTRAVENOUS | Status: AC | PRN
  Administered 2015-11-07: 29.47 via INTRAVENOUS

## 2015-11-07 MED ORDER — TECHNETIUM TC 99M SESTAMIBI - CARDIOLITE
13.0000 | Freq: Once | INTRAVENOUS | Status: AC | PRN
  Administered 2015-11-07: 12.95 via INTRAVENOUS

## 2015-11-10 ENCOUNTER — Telehealth: Payer: Self-pay

## 2015-11-10 NOTE — Telephone Encounter (Signed)
Pt would like stress test results. Please call. 

## 2015-11-11 ENCOUNTER — Other Ambulatory Visit: Payer: Self-pay

## 2015-11-11 MED ORDER — METOPROLOL TARTRATE 25 MG PO TABS: 25.0000 mg | ORAL_TABLET | Freq: Two times a day (BID) | ORAL | Status: DC

## 2015-11-11 NOTE — Telephone Encounter (Signed)
See result note.  

## 2015-11-18 DIAGNOSIS — H903 Sensorineural hearing loss, bilateral: Secondary | ICD-10-CM | POA: Diagnosis not present

## 2015-11-18 DIAGNOSIS — H9313 Tinnitus, bilateral: Secondary | ICD-10-CM | POA: Diagnosis not present

## 2015-12-01 ENCOUNTER — Ambulatory Visit: Payer: Medicare Other | Admitting: Internal Medicine

## 2015-12-16 ENCOUNTER — Ambulatory Visit (INDEPENDENT_AMBULATORY_CARE_PROVIDER_SITE_OTHER): Payer: Medicare Other | Admitting: Cardiovascular Disease

## 2015-12-16 ENCOUNTER — Encounter: Payer: Self-pay | Admitting: Cardiovascular Disease

## 2015-12-16 VITALS — BP 120/60 | HR 61 | Ht 67.0 in | Wt 146.2 lb

## 2015-12-16 DIAGNOSIS — E785 Hyperlipidemia, unspecified: Secondary | ICD-10-CM

## 2015-12-16 DIAGNOSIS — I493 Ventricular premature depolarization: Secondary | ICD-10-CM | POA: Diagnosis not present

## 2015-12-16 DIAGNOSIS — R002 Palpitations: Secondary | ICD-10-CM | POA: Diagnosis not present

## 2015-12-16 DIAGNOSIS — R0789 Other chest pain: Secondary | ICD-10-CM

## 2015-12-16 DIAGNOSIS — E7849 Other hyperlipidemia: Secondary | ICD-10-CM

## 2015-12-16 MED ORDER — METOPROLOL TARTRATE 25 MG PO TABS: 12.5000 mg | ORAL_TABLET | Freq: Two times a day (BID) | ORAL | Status: DC

## 2015-12-16 NOTE — Assessment & Plan Note (Signed)
She is currently on simvastatin. 

## 2015-12-16 NOTE — Patient Instructions (Addendum)
Medication Instructions:  Your physician has recommended you make the following change in your medication:  DECREASE metoprolol to 12.5mg  twice daily   Labwork: none  Testing/Procedures: Your physician has recommended that you wear a holter monitor. Holter monitors are medical devices that record the heart's electrical activity. Doctors most often use these monitors to diagnose arrhythmias. Arrhythmias are problems with the speed or rhythm of the heartbeat. The monitor is a small, portable device. You can wear one while you do your normal daily activities. This is usually used to diagnose what is causing palpitations/syncope (passing out).    Follow-Up: Your physician wants you to follow-up in: 6 months with Dr. Fletcher Anon.  You will receive a reminder letter in the mail two months in advance. If you don't receive a letter, please call our office to schedule the follow-up appointment.   Any Other Special Instructions Will Be Listed Below (If Applicable).     If you need a refill on your cardiac medications before your next appointment, please call your pharmacy.  Holter Monitoring A Holter monitor is a small device that is used to detect abnormal heart rhythms. It clips to your clothing and is connected by wires to flat, sticky disks (electrodes) that attach to your chest. It is worn continuously for 24-48 hours. HOME CARE INSTRUCTIONS  Wear your Holter monitor at all times, even while exercising and sleeping, for as long as directed by your health care provider.  Make sure that the Holter monitor is safely clipped to your clothing or close to your body as recommended by your health care provider.  Do not get the monitor or wires wet.  Do not put body lotion or moisturizer on your chest.  Keep your skin clean.  Keep a diary of your daily activities, such as walking and doing chores. If you feel that your heartbeat is abnormal or that your heart is fluttering or skipping a  beat:  Record what you are doing when it happens.  Record what time of day the symptoms occur.  Return your Holter monitor as directed by your health care provider.  Keep all follow-up visits as directed by your health care provider. This is important. SEEK IMMEDIATE MEDICAL CARE IF:  You feel lightheaded or you faint.  You have trouble breathing.  You feel pain in your chest, upper arm, or jaw.  You feel sick to your stomach and your skin is pale, cool, or damp.  You heartbeat feels unusual or abnormal.   This information is not intended to replace advice given to you by your health care provider. Make sure you discuss any questions you have with your health care provider.   Document Released: 08/06/2004 Document Revised: 11/29/2014 Document Reviewed: 06/17/2014 Elsevier Interactive Patient Education [employer].

## 2015-12-16 NOTE — Assessment & Plan Note (Signed)
She is still complaining of exertional palpitations and does some other arrhythmias cannot be completely excluded. Thus, I requested a 48-hour Holter monitor. I decreased the dose of metoprolol to 12.5 mg twice daily due to fatigue.

## 2015-12-16 NOTE — Progress Notes (Signed)
Primary care physician: Dr. Anderson Malta walker  HPI  This is a pleasant 75 year old female who is here today for a follow-up visit regarding chest pain or palpitations.  She was seen recently for one episode of substernal chest tightness radiating to her jaw on Thanksgiving day while she was driving with her husband. It lasted for about 15 minutes . I proceeded with a treadmill nuclear stress test which showed no evidence of ischemia with low normal ejection fraction. However, she was hypertensive on presentation with hypertensive response to exercise. Thus, I added metoprolol 25 mg twice daily. She reports no further episodes of chest pain. Nonetheless, she continues to complain of exertional palpitations during exercise. She feels that metoprolol is making her more tired. She denies any shortness of breath.    Allergies  Allergen Reactions  . Prednisone     "crazy"     Current Outpatient Prescriptions on File Prior to Visit  Medication Sig Dispense Refill  . aspirin EC 81 MG tablet Take 81 mg by mouth daily.      . cholecalciferol (VITAMIN D) 1000 UNITS tablet Take 1,000 Units by mouth daily.    Marland Kitchen glucosamine-chondroitin ([employer]S GLUCOSAMINE-CHONDROITIN) 500-400 MG tablet Take 1 tablet by mouth daily.     Marland Kitchen levothyroxine (SYNTHROID, LEVOTHROID) 50 MCG tablet TAKE 1 TABLET (50 MCG TOTAL) BY MOUTH DAILY. 90 tablet 1  . meloxicam (MOBIC) 15 MG tablet TAKE 1 TABLET(S) EVERY DAY AS NEEDED BY MOUTH WITH FOOD. 30 tablet 3  . metoprolol tartrate (LOPRESSOR) 25 MG tablet Take 1 tablet (25 mg total) by mouth 2 (two) times daily. 60 tablet 3  . Misc Natural Products (OSTEO BI-FLEX JOINT SHIELD PO) Take 1 tablet by mouth daily.    . Multiple Vitamins-Minerals (MULTIVITAMIN WITH MINERALS) tablet Take 1 tablet by mouth daily.      . simvastatin (ZOCOR) 20 MG tablet TAKE 1 TABLET (20 MG TOTAL) BY MOUTH DAILY AT 6 PM. 90 tablet 3   No current facility-administered medications on file prior to visit.      Past Medical History  Diagnosis Date  . Hyperlipidemia   . Thyroid disease      Past Surgical History  Procedure Laterality Date  . Abdominal hysterectomy      In 30's  . Vaginal prolapse repair  2014    Dr. Enzo Bi     Family History  Problem Relation Age of Onset  . Breast cancer Mother   . Heart attack Mother   . Stroke Father   . Colon cancer Neg Hx   . Pancreatic cancer Sister   . Cancer Brother   . Heart attack Brother   . Diabetes Brother      Social History   Social History  . Marital Status: Married    Spouse Name: N/A  . Number of Children: 2  . Years of Education: N/A   Occupational History  . Retired Education officer, museum    Social History Main Topics  . Smoking status: Former Research scientist (life sciences)  . Smokeless tobacco: Never Used     Comment: Smoked x 1 year in her 58's  . Alcohol Use: No  . Drug Use: No  . Sexual Activity: No   Other Topics Concern  . Not on file   Social History Narrative   Daily Caffeine Use:  One cup coffee in am    Regular Exercise -  5 days a week x 1 hour - gym, aerobic and wt training  ROS A 10 point review of system was performed. It is negative other than that mentioned in the history of present illness.   PHYSICAL EXAM   BP 120/60 mmHg  Pulse 61  Ht 5\' 7"  (1.702 m)  Wt 146 lb 4 oz (66.339 kg)  BMI 22.90 kg/m2  SpO2 98%  Constitutional: She is oriented to person, place, and time. She appears well-developed and well-nourished. No distress.  HENT: No nasal discharge.  Head: Normocephalic and atraumatic.  Eyes: Pupils are equal and round. No discharge.  Neck: Normal range of motion. Neck supple. No JVD present. No thyromegaly present.  Cardiovascular: Normal rate, regular rhythm, normal heart sounds. Exam reveals no gallop and no friction rub. No murmur heard.  Pulmonary/Chest: Effort normal and breath sounds normal. No stridor. No respiratory distress. She has no wheezes. She has no rales. She exhibits  no tenderness.  Abdominal: Soft. Bowel sounds are normal. She exhibits no distension. There is no tenderness. There is no rebound and no guarding.  Musculoskeletal: Normal range of motion. She exhibits no edema and no tenderness.  Neurological: She is alert and oriented to person, place, and time. Coordination normal.  Skin: Skin is warm and dry. No rash noted. She is not diaphoretic. No erythema. No pallor.  Psychiatric: She has a normal mood and affect. Her behavior is normal. Judgment and thought content normal.      ASSESSMENT AND PLAN

## 2015-12-16 NOTE — Assessment & Plan Note (Signed)
She reports no further episodes of chest pain. Recent stress test was normal. Continue medical therapy.

## 2015-12-22 ENCOUNTER — Ambulatory Visit (INDEPENDENT_AMBULATORY_CARE_PROVIDER_SITE_OTHER): Payer: Medicare Other

## 2015-12-22 DIAGNOSIS — R002 Palpitations: Secondary | ICD-10-CM | POA: Diagnosis not present

## 2015-12-26 ENCOUNTER — Encounter: Payer: Self-pay | Admitting: *Deleted

## 2015-12-29 ENCOUNTER — Ambulatory Visit: Payer: Medicare Other | Admitting: Anesthesiology

## 2015-12-29 ENCOUNTER — Encounter
Admission: RE | Disposition: A | Discharge status: Home / Self Care | Payer: Self-pay | Source: Ambulatory Visit | Attending: Gastroenterology

## 2015-12-29 ENCOUNTER — Encounter: Payer: Self-pay | Admitting: *Deleted

## 2015-12-29 ENCOUNTER — Ambulatory Visit
Admission: RE | Admit: 2015-12-29 | Discharge: 2015-12-29 | Disposition: A | Discharge status: Home / Self Care | Payer: Medicare Other | Source: Ambulatory Visit | Attending: Gastroenterology | Admitting: Gastroenterology

## 2015-12-29 DIAGNOSIS — D124 Benign neoplasm of descending colon: Secondary | ICD-10-CM | POA: Diagnosis not present

## 2015-12-29 DIAGNOSIS — Z87891 Personal history of nicotine dependence: Secondary | ICD-10-CM | POA: Insufficient documentation

## 2015-12-29 DIAGNOSIS — Z7982 Long term (current) use of aspirin: Secondary | ICD-10-CM | POA: Diagnosis not present

## 2015-12-29 DIAGNOSIS — K635 Polyp of colon: Secondary | ICD-10-CM | POA: Insufficient documentation

## 2015-12-29 DIAGNOSIS — E785 Hyperlipidemia, unspecified: Secondary | ICD-10-CM | POA: Insufficient documentation

## 2015-12-29 DIAGNOSIS — Z791 Long term (current) use of non-steroidal anti-inflammatories (NSAID): Secondary | ICD-10-CM | POA: Diagnosis not present

## 2015-12-29 DIAGNOSIS — Z1211 Encounter for screening for malignant neoplasm of colon: Secondary | ICD-10-CM | POA: Diagnosis not present

## 2015-12-29 DIAGNOSIS — Z888 Allergy status to other drugs, medicaments and biological substances status: Secondary | ICD-10-CM | POA: Diagnosis not present

## 2015-12-29 DIAGNOSIS — Z800 Family history of malignant neoplasm of digestive organs: Secondary | ICD-10-CM | POA: Diagnosis not present

## 2015-12-29 DIAGNOSIS — Z79899 Other long term (current) drug therapy: Secondary | ICD-10-CM | POA: Insufficient documentation

## 2015-12-29 DIAGNOSIS — E079 Disorder of thyroid, unspecified: Secondary | ICD-10-CM | POA: Insufficient documentation

## 2015-12-29 HISTORY — PX: COLONOSCOPY WITH PROPOFOL: SHX5780

## 2015-12-29 LAB — HM COLONOSCOPY

## 2015-12-29 SURGERY — COLONOSCOPY WITH PROPOFOL
Anesthesia: General

## 2015-12-29 MED ORDER — SODIUM CHLORIDE 0.9 % IV SOLN
INTRAVENOUS | Status: DC
  Administered 2015-12-29: 09:00:00 via INTRAVENOUS

## 2015-12-29 MED ORDER — PROPOFOL 500 MG/50ML IV EMUL
INTRAVENOUS | Status: DC | PRN
  Administered 2015-12-29: 100 ug/kg/min via INTRAVENOUS

## 2015-12-29 MED ORDER — LIDOCAINE HCL (CARDIAC) 20 MG/ML IV SOLN
INTRAVENOUS | Status: DC | PRN
  Administered 2015-12-29: 30 mg via INTRAVENOUS

## 2015-12-29 MED ORDER — PROPOFOL 10 MG/ML IV BOLUS
INTRAVENOUS | Status: DC | PRN
  Administered 2015-12-29: 70 mg via INTRAVENOUS

## 2015-12-29 MED ORDER — SODIUM CHLORIDE 0.9 % IV SOLN: INTRAVENOUS | Status: DC

## 2015-12-29 NOTE — Op Note (Signed)
[facility] Gastroenterology Patient Name: [name] Procedure Date: 12/29/2015 8:59 AM MRN: [id number] Account #: [id number] Date of Birth: 1941-02-17 Admit Type: Outpatient Age: 75 Room: [facility] ENDO ROOM 4 Gender: Female Note Status: Finalized Procedure:         Colonoscopy Indications:       Screening in patient at increased risk: Family history of                     1st-degree relative with colorectal cancer Providers:         Lupita Dawn. Candace Cruise, MD Referring MD:      Eduard Clos. Gilford Rile, MD (Referring MD) Medicines:         Monitored Anesthesia Care Complications:     No immediate complications. Procedure:         Pre-Anesthesia Assessment:                    - Prior to the procedure, a History and Physical was                     performed, and patient medications, allergies and                     sensitivities were reviewed. The patient's tolerance of                     previous anesthesia was reviewed.                    - The risks and benefits of the procedure and the sedation                     options and risks were discussed with the patient. All                     questions were answered and informed consent was obtained.                    - After reviewing the risks and benefits, the patient was                     deemed in satisfactory condition to undergo the procedure.                    After obtaining informed consent, the colonoscope was                     passed under direct vision. Throughout the procedure, the                     patient's blood pressure, pulse, and oxygen saturations                     were monitored continuously. The Colonoscope was                     introduced through the anus and advanced to the the cecum,                     identified by appendiceal orifice and ileocecal valve. The                     colonoscopy was performed without difficulty. The patient  tolerated the procedure well. The  quality of the bowel                     preparation was good. Findings:      A small polyp was found in the distal descending colon. The polyp was       sessile. The polyp was removed with a jumbo cold forceps. Resection and       retrieval were complete.      The exam was otherwise without abnormality. Impression:        - One small polyp in the distal descending colon. Resected                     and retrieved.                    - The examination was otherwise normal. Recommendation:    - Discharge patient to home.                    - Await pathology results.                    - Repeat colonoscopy in 5 years for surveillance based on                     pathology results.                    - The findings and recommendations were discussed with the                     patient. Procedure Code(s): --- Professional ---                    [phone], Colonoscopy, flexible; with biopsy, single or                     multiple Diagnosis Code(s): --- Professional ---                    Z80.0, Family history of malignant neoplasm of digestive                     organs                    D12.4, Benign neoplasm of descending colon CPT copyright 2014 American Medical Association. All rights reserved. The codes documented in this report are preliminary and upon coder review may  be revised to meet current compliance requirements. Hulen Luster, MD 12/29/2015 9:20:04 AM This report has been signed electronically. Number of Addenda: 0 Note Initiated On: 12/29/2015 8:59 AM Scope Withdrawal Time: 0 hours 7 minutes 21 seconds  Total Procedure Duration: 0 hours 14 minutes 34 seconds       [facility]

## 2015-12-29 NOTE — Anesthesia Preprocedure Evaluation (Signed)
Anesthesia Evaluation  Patient identified by MRN, date of birth, ID band Patient awake    Reviewed: Allergy & Precautions, H&P , NPO status , Patient's Chart, lab work & pertinent test results, reviewed documented beta blocker date and time   History of Anesthesia Complications Negative for: history of anesthetic complications  Airway Mallampati: I  TM Distance: >3 FB Neck ROM: full    Dental no notable dental hx. (+) Caps, Teeth Intact   Pulmonary neg pulmonary ROS, former smoker,    Pulmonary exam normal breath sounds clear to auscultation       Cardiovascular Exercise Tolerance: Good (-) hypertension(-) angina+ Peripheral Vascular Disease  (-) CAD, (-) Past MI, (-) Cardiac Stents and (-) CABG Normal cardiovascular exam+ dysrhythmias (palpitations) (-) Valvular Problems/Murmurs Rhythm:regular Rate:Normal     Neuro/Psych negative neurological ROS  negative psych ROS   GI/Hepatic negative GI ROS, Neg liver ROS,   Endo/Other  neg diabetesHypothyroidism   Renal/GU negative Renal ROS  negative genitourinary   Musculoskeletal   Abdominal   Peds  Hematology negative hematology ROS (+)   Anesthesia Other Findings Past Medical History:   Hyperlipidemia                                               Thyroid disease                                              Reproductive/Obstetrics negative OB ROS                             Anesthesia Physical Anesthesia Plan  ASA: II  Anesthesia Plan: General   Post-op Pain Management:    Induction:   Airway Management Planned:   Additional Equipment:   Intra-op Plan:   Post-operative Plan:   Informed Consent: I have reviewed the patients History and Physical, chart, labs and discussed the procedure including the risks, benefits and alternatives for the proposed anesthesia with the patient or authorized representative who has indicated his/her  understanding and acceptance.   Dental Advisory Given  Plan Discussed with: Anesthesiologist, CRNA and Surgeon  Anesthesia Plan Comments:         Anesthesia Quick Evaluation

## 2015-12-29 NOTE — Transfer of Care (Signed)
Immediate Anesthesia Transfer of Care Note  Patient: [name]  Procedure(s) Performed: Procedure(s): COLONOSCOPY WITH PROPOFOL (N/A)  Patient Location: Endoscopy Unit  Anesthesia Type:General  Level of Consciousness: sedated  Airway & Oxygen Therapy: Patient Spontanous Breathing and Patient connected to nasal cannula oxygen  Post-op Assessment: Report given to RN and Post -op Vital signs reviewed and stable  Post vital signs: Reviewed and stable  Last Vitals:  Filed Vitals:   12/29/15 0824 12/29/15 0921  BP: 130/55 133/70  Pulse: 45 63  Temp: 37.4 C 36.3 C  Resp: 16 20    Complications: No apparent anesthesia complications

## 2015-12-29 NOTE — H&P (Signed)
Primary Care Physician:  Rica Mast, MD Primary Gastroenterologist:  Dr. Candace Cruise  Pre-Procedure History & Physical: HPI:  [name] is a 75 y.o. female is here for an colonoscopy.  Past Medical History  Diagnosis Date  . Hyperlipidemia   . Thyroid disease     Past Surgical History  Procedure Laterality Date  . Abdominal hysterectomy      In 30's  . Vaginal prolapse repair  2014    Dr. Enzo Bi    Prior to Admission medications   Medication Sig Start Date End Date Taking? Authorizing Provider  cholecalciferol (VITAMIN D) 1000 UNITS tablet Take 1,000 Units by mouth daily.   Yes Historical Provider, MD  glucosamine-chondroitin (CVS GLUCOSAMINE-CHONDROITIN) 500-400 MG tablet Take 1 tablet by mouth daily.    Yes Historical Provider, MD  levothyroxine (SYNTHROID, LEVOTHROID) 50 MCG tablet TAKE 1 TABLET (50 MCG TOTAL) BY MOUTH DAILY. 09/08/15  Yes Jackolyn Confer, MD  metoprolol tartrate (LOPRESSOR) 25 MG tablet Take 0.5 tablets (12.5 mg total) by mouth 2 (two) times daily. 12/16/15  Yes Wellington Hampshire, MD  Misc Natural Products (OSTEO BI-FLEX JOINT SHIELD PO) Take 1 tablet by mouth daily.   Yes Historical Provider, MD  Multiple Vitamins-Minerals (MULTIVITAMIN WITH MINERALS) tablet Take 1 tablet by mouth daily.     Yes Historical Provider, MD  simvastatin (ZOCOR) 20 MG tablet TAKE 1 TABLET (20 MG TOTAL) BY MOUTH DAILY AT 6 PM. 09/01/15  Yes Jackolyn Confer, MD  aspirin EC 81 MG tablet Take 81 mg by mouth daily.      Historical Provider, MD  meloxicam (MOBIC) 15 MG tablet TAKE 1 TABLET(S) EVERY DAY AS NEEDED BY MOUTH WITH FOOD. 09/17/15   Jackolyn Confer, MD    Allergies as of 10/27/2015 - Review Complete 09/17/2015  Allergen Reaction Noted  . Prednisone  11/17/2011    Family History  Problem Relation Age of Onset  . Breast cancer Mother   . Heart attack Mother   . Stroke Father   . Colon cancer Neg Hx   . Pancreatic cancer Sister   . Cancer Brother    . Heart attack Brother   . Diabetes Brother     Social History   Social History  . Marital Status: Married    Spouse Name: N/A  . Number of Children: 2  . Years of Education: N/A   Occupational History  . Retired Education officer, museum    Social History Main Topics  . Smoking status: Former Smoker -- 1 years    Types: Cigarettes  . Smokeless tobacco: Never Used     Comment: Smoked x 1 year in her 63's  . Alcohol Use: No  . Drug Use: No  . Sexual Activity: No   Other Topics Concern  . Not on file   Social History Narrative   Daily Caffeine Use:  One cup coffee in am    Regular Exercise -  5 days a week x 1 hour - gym, aerobic and wt training          Review of Systems: See HPI, otherwise negative ROS  Physical Exam: BP 130/55 mmHg  Pulse 45  Temp(Src) 99.3 F (37.4 C) (Tympanic)  Resp 16  SpO2 100% General:   Alert,  pleasant and cooperative in NAD Head:  Normocephalic and atraumatic. Neck:  Supple; no masses or thyromegaly. Lungs:  Clear throughout to auscultation.    Heart:  Regular rate and rhythm. Abdomen:  Soft, nontender and  nondistended. Normal bowel sounds, without guarding, and without rebound.   Neurologic:  Alert and  oriented x4;  grossly normal neurologically.  Impression/Plan: [name] is here for an colonoscopy to be performed for family hx of colon cancer. Risks, benefits, limitations, and alternatives regarding colonoscopy have been reviewed with the patient.  Questions have been answered.  All parties agreeable.   Keo Schirmer, Lupita Dawn, MD  12/29/2015, 8:34 AM

## 2015-12-29 NOTE — Anesthesia Postprocedure Evaluation (Signed)
Anesthesia Post Note  Patient: [name]  Procedure(s) Performed: Procedure(s) (LRB): COLONOSCOPY WITH PROPOFOL (N/A)  Patient location during evaluation: Endoscopy Anesthesia Type: General Level of consciousness: awake and alert Pain management: pain level controlled Vital Signs Assessment: post-procedure vital signs reviewed and stable Respiratory status: spontaneous breathing, nonlabored ventilation, respiratory function stable and patient connected to nasal cannula oxygen Cardiovascular status: blood pressure returned to baseline and stable Postop Assessment: no signs of nausea or vomiting Anesthetic complications: no    Last Vitals:  Filed Vitals:   12/29/15 0940 12/29/15 0950  BP: 98/86 114/62  Pulse: 47 58  Temp:    Resp: 18 16    Last Pain: There were no vitals filed for this visit.               Martha Clan

## 2015-12-30 ENCOUNTER — Encounter: Payer: Self-pay | Admitting: Gastroenterology

## 2015-12-30 LAB — SURGICAL PATHOLOGY

## 2016-01-02 ENCOUNTER — Other Ambulatory Visit: Payer: Self-pay | Admitting: Cardiovascular Disease

## 2016-01-02 ENCOUNTER — Ambulatory Visit
Admission: RE | Admit: 2016-01-02 | Discharge: 2016-01-02 | Disposition: A | Discharge status: Home / Self Care | Payer: Medicare Other | Source: Ambulatory Visit | Attending: Cardiovascular Disease | Admitting: Cardiovascular Disease

## 2016-01-02 DIAGNOSIS — R002 Palpitations: Secondary | ICD-10-CM

## 2016-01-23 ENCOUNTER — Encounter: Payer: Self-pay | Admitting: Internal Medicine

## 2016-02-02 ENCOUNTER — Telehealth: Payer: Self-pay | Admitting: Cardiovascular Disease

## 2016-02-02 ENCOUNTER — Other Ambulatory Visit: Payer: Self-pay

## 2016-02-02 MED ORDER — SIMVASTATIN 10 MG PO TABS: 10.0000 mg | ORAL_TABLET | Freq: Every day | ORAL | Status: DC

## 2016-02-02 MED ORDER — DILTIAZEM HCL ER COATED BEADS 120 MG PO CP24: 120.0000 mg | ORAL_CAPSULE | Freq: Every day | ORAL | Status: DC

## 2016-02-02 NOTE — Telephone Encounter (Signed)
Pt called back to review Holter Monitor results and medications changes. Reviewed results and recommendations w/pt who states she will review with her daughter.

## 2016-03-06 ENCOUNTER — Other Ambulatory Visit: Payer: Self-pay | Admitting: Internal Medicine

## 2016-06-07 ENCOUNTER — Other Ambulatory Visit: Payer: Self-pay | Admitting: Internal Medicine

## 2016-07-24 ENCOUNTER — Other Ambulatory Visit: Payer: Self-pay | Admitting: Cardiovascular Disease

## 2016-07-27 ENCOUNTER — Ambulatory Visit (INDEPENDENT_AMBULATORY_CARE_PROVIDER_SITE_OTHER): Payer: Medicare Other

## 2016-07-27 ENCOUNTER — Encounter (INDEPENDENT_AMBULATORY_CARE_PROVIDER_SITE_OTHER): Payer: Self-pay

## 2016-07-27 VITALS — BP 118/62 | HR 64 | Temp 97.9°F | Resp 12 | Ht 66.0 in | Wt 152.1 lb

## 2016-07-27 DIAGNOSIS — Z23 Encounter for immunization: Secondary | ICD-10-CM | POA: Diagnosis not present

## 2016-07-27 DIAGNOSIS — Z0000 Encounter for general adult medical examination without abnormal findings: Secondary | ICD-10-CM | POA: Diagnosis not present

## 2016-07-27 NOTE — Progress Notes (Addendum)
Subjective:   [name] is a 75 y.o. female who presents for Medicare Annual (Subsequent) preventive examination.  Review of Systems:  No ROS.  Medicare Wellness Visit. Cardiac Risk Factors include: advanced age (>81men, >26 women)     Objective:     Vitals: BP 118/62 (BP Location: Left Arm, Patient Position: Sitting, Cuff Size: Normal)   Pulse 64   Temp 97.9 F (36.6 C) (Oral)   Resp 12   Ht 5\' 6"  (1.676 m)   Wt 152 lb 1.9 oz (69 kg)   SpO2 97%   BMI 24.55 kg/m   Body mass index is 24.55 kg/m.   Tobacco History  Smoking Status  . Former Smoker  . Years: 1.00  . Types: Cigarettes  Smokeless Tobacco  . Never Used    Comment: Smoked x 1 year in her 20's     Counseling given: Not Answered   Past Medical History:  Diagnosis Date  . Hyperlipidemia   . Thyroid disease    Past Surgical History:  Procedure Laterality Date  . ABDOMINAL HYSTERECTOMY     In 30's  . COLONOSCOPY WITH PROPOFOL N/A 12/29/2015   Procedure: COLONOSCOPY WITH PROPOFOL;  Surgeon: Hulen Luster, MD;  Location: [facility] ENDOSCOPY;  Service: Gastroenterology;  Laterality: N/A;  . VAGINAL PROLAPSE REPAIR  2014   Dr. Enzo Bi   Family History  Problem Relation Age of Onset  . Breast cancer Mother   . Heart attack Mother   . Diabetes Mother   . Stroke Father   . Diabetes Father   . Pancreatic cancer Sister   . Cancer Sister     pancreatic  . Cancer Brother     liver  . Diabetes Brother   . Cancer Sister     anal  . Heart attack Brother   . Diabetes Brother   . Cancer Brother     stomach  . Cancer Brother   . Diabetes Brother   . Colon cancer Neg Hx    History  Sexual Activity  . Sexual activity: No    Outpatient Encounter Prescriptions as of 07/27/2016  Medication Sig  . aspirin EC 81 MG tablet Take 81 mg by mouth daily.    . cholecalciferol (VITAMIN D) 1000 UNITS tablet Take 1,000 Units by mouth daily.  Marland Kitchen diltiazem (CARDIZEM CD) 120 MG 24 hr capsule TAKE 1 CAPSULE (120 MG  TOTAL) BY MOUTH DAILY.  Marland Kitchen glucosamine-chondroitin (CVS GLUCOSAMINE-CHONDROITIN) 500-400 MG tablet Take 1 tablet by mouth daily.   Marland Kitchen levothyroxine (SYNTHROID, LEVOTHROID) 50 MCG tablet TAKE 1 TABLET (50 MCG TOTAL) BY MOUTH DAILY.  . meloxicam (MOBIC) 15 MG tablet TAKE 1 TABLET(S) EVERY DAY AS NEEDED BY MOUTH WITH FOOD.  . Misc Natural Products (OSTEO BI-FLEX JOINT SHIELD PO) Take 1 tablet by mouth daily.  . Multiple Vitamins-Minerals (MULTIVITAMIN WITH MINERALS) tablet Take 1 tablet by mouth daily.    . simvastatin (ZOCOR) 10 MG tablet TAKE 1 TABLET (10 MG TOTAL) BY MOUTH AT BEDTIME.   No facility-administered encounter medications on file as of 07/27/2016.     Activities of Daily Living In your present state of health, do you have any difficulty performing the following activities: 07/27/2016  Hearing? N  Vision? N  Difficulty concentrating or making decisions? N  Walking or climbing stairs? N  Dressing or bathing? N  Doing errands, shopping? N  Preparing Food and eating ? N  Using the Toilet? N  In the past six months, have you accidently  leaked urine? N  Do you have problems with loss of bowel control? N  Managing your Medications? N  Managing your Finances? N  Housekeeping or managing your Housekeeping? N  Some recent data might be hidden    Patient Care Team: [name], MD as PCP - General (Internal Medicine)    Assessment:   This is a routine wellness examination for Alianys. The goal of the wellness visit is to assist the patient how to close the gaps in care and create a preventative care plan for the patient.   Taking calcium VIT D as appropriate/Osteoporosis risk reviewed.  Medications reviewed; taking without issues or barriers.  Safety issues reviewed; smoke detectors in the home. No firearms in the home. Wears seatbelts when driving or riding with others. No violence in the home. No falls.   No identified risk were noted; The patient was oriented x 3;  appropriate in dress and manner and no objective failures at ADL's or IADL's.   Caregiver to husband.  Body mass index; normal/within range. Discussed the importance of maintaining a healthy diet, water intake and exercise.   Dental; she has seen her dentist (Dr. Delice Bison) within the last 6 months.    Influenza high dose vaccine administered L deltoid. Tolerated well. Health maintenance gaps; closed.  Patient Concerns: Intermittent lower extremity joint pain, restless legs.  3 out of 10 on the pain scale.  Managed by  medication and slowing down between activities.  Need for physical.  Appointment scheduled for follow up with PCP.    Exercise Activities and Dietary recommendations Current Exercise Habits: Structured exercise class;Home exercise routine, Type of exercise: calisthenics;walking;stretching, Frequency (Times/Week): 5, Intensity: Moderate  Goals    . Increase fiber intake          Foods high in fiber to help with constipation.  Educational material provided.    . Increase water intake          Increase water intake up to 2 bottles (4 cups ) daily. Stay hydrated!         Fall Risk Fall Risk  07/27/2016 07/25/2015 12/07/2013 11/20/2012  Falls in the past year? No No No No   Depression Screen PHQ 2/9 Scores 07/27/2016 07/25/2015 12/07/2013 11/20/2012  PHQ - 2 Score 0 0 0 0     Cognitive Testing MMSE - Mini Mental State Exam 07/27/2016 07/25/2015  Orientation to time 5 5  Orientation to Place 5 5  Registration 3 3  Attention/ Calculation 5 5  Recall 3 3  Language- name 2 objects 2 2  Language- repeat 1 1  Language- follow 3 step command 3 3  Language- read & follow direction 1 1  Write a sentence 1 1  Copy design 1 1  Total score 30 30    Immunization History  Administered Date(s) Administered  . Influenza Split 08/18/2011, 08/04/2012  . Influenza Whole 09/10/2013  . Influenza, High Dose Seasonal PF 07/25/2015, 07/27/2016  . Influenza,inj,Quad PF,36+ Mos 09/06/2014    . Pneumococcal Conjugate-13 12/07/2013  . Pneumococcal Polysaccharide-23 12/18/2007  . Tdap 11/20/2012  . Zoster 12/18/2007   Screening Tests Health Maintenance  Topic Date Due  . TETANUS/TDAP  11/20/2022  . COLONOSCOPY  12/28/2025  . INFLUENZA VACCINE  Completed  . DEXA SCAN  Completed  . ZOSTAVAX  Completed  . PNA vac Low Risk Adult  Completed      Plan:   End of life planning; Advance aging; Advanced directives discussed. Copy of current HCPOA/Living  Will requested.  During the course of the visit the patient was educated and counseled about the following appropriate screening and preventive services:   Vaccines to include Pneumoccal, Influenza, Hepatitis B, Td, Zostavax, HCV  Electrocardiogram  Cardiovascular Disease  Colorectal cancer screening  Bone density screening  Diabetes screening  Glaucoma screening  Mammography/PAP  Nutrition counseling   Patient Instructions (the written plan) was given to the patient.   OBrien-Blaney, Denisa L, LPN  [id number]     I have reviewed the above information and agree with above.   Deborra Medina, MD

## 2016-07-27 NOTE — Patient Instructions (Addendum)
  Ms. Heo , Thank you for taking time to come for your Medicare Wellness Visit. I appreciate your ongoing commitment to your health goals. Please review the following plan we discussed and let me know if I can assist you in the future.   FOLLOW UP WITH DR. Derrel Nip AS NEEDED.  These are the goals we discussed: Goals    . Increase fiber intake          Foods high in fiber to help with constipation.  Educational material provided.    . Increase water intake          Increase water intake up to 2 bottles (4 cups ) daily. Stay hydrated!          This is a list of the screening recommended for you and due dates:  Health Maintenance  Topic Date Due  . Tetanus Vaccine  11/20/2022  . Colon Cancer Screening  12/28/2025  . Flu Shot  Completed  . DEXA scan (bone density measurement)  Completed  . Shingles Vaccine  Completed  . Pneumonia vaccines  Completed

## 2016-08-06 NOTE — Progress Notes (Signed)
Patient's PCP out of the office at time of this visit. I have reviewed and agree with the above information.  Tommi Rumps, M.D.

## 2016-09-03 ENCOUNTER — Ambulatory Visit (INDEPENDENT_AMBULATORY_CARE_PROVIDER_SITE_OTHER): Payer: Medicare Other | Admitting: Internal Medicine

## 2016-09-03 ENCOUNTER — Encounter: Payer: Self-pay | Admitting: Internal Medicine

## 2016-09-03 VITALS — BP 134/78 | HR 62 | Temp 98.1°F | Resp 20 | Wt 152.0 lb

## 2016-09-03 DIAGNOSIS — E039 Hypothyroidism, unspecified: Secondary | ICD-10-CM | POA: Diagnosis not present

## 2016-09-03 DIAGNOSIS — R945 Abnormal results of liver function studies: Secondary | ICD-10-CM

## 2016-09-03 DIAGNOSIS — E559 Vitamin D deficiency, unspecified: Secondary | ICD-10-CM

## 2016-09-03 DIAGNOSIS — R079 Chest pain, unspecified: Secondary | ICD-10-CM

## 2016-09-03 DIAGNOSIS — E784 Other hyperlipidemia: Secondary | ICD-10-CM

## 2016-09-03 DIAGNOSIS — Z0000 Encounter for general adult medical examination without abnormal findings: Secondary | ICD-10-CM

## 2016-09-03 DIAGNOSIS — R5383 Other fatigue: Secondary | ICD-10-CM

## 2016-09-03 DIAGNOSIS — R7989 Other specified abnormal findings of blood chemistry: Secondary | ICD-10-CM | POA: Diagnosis not present

## 2016-09-03 DIAGNOSIS — Z1239 Encounter for other screening for malignant neoplasm of breast: Secondary | ICD-10-CM

## 2016-09-03 DIAGNOSIS — E7849 Other hyperlipidemia: Secondary | ICD-10-CM

## 2016-09-03 DIAGNOSIS — Z1231 Encounter for screening mammogram for malignant neoplasm of breast: Secondary | ICD-10-CM

## 2016-09-03 LAB — VITAMIN D 25 HYDROXY (VIT D DEFICIENCY, FRACTURES): VITD: 51.02 ng/mL (ref 30.00–100.00)

## 2016-09-03 LAB — COMPREHENSIVE METABOLIC PANEL
ALT: 21 U/L (ref 0–35)
AST: 25 U/L (ref 0–37)
Albumin: 4.6 g/dL (ref 3.5–5.2)
Alkaline Phosphatase: 43 U/L (ref 39–117)
BUN: 15 mg/dL (ref 6–23)
CO2: 31 mEq/L (ref 19–32)
Calcium: 9.8 mg/dL (ref 8.4–10.5)
Chloride: 103 mEq/L (ref 96–112)
Creatinine, Ser: 0.89 mg/dL (ref 0.40–1.20)
GFR: 65.67 mL/min (ref >60.00)
Glucose, Bld: 96 mg/dL (ref 70–99)
Potassium: 4.7 mEq/L (ref 3.5–5.1)
Sodium: 141 mEq/L (ref 135–145)
Total Bilirubin: 0.4 mg/dL (ref 0.2–1.2)
Total Protein: 7.6 g/dL (ref 6.0–8.3)

## 2016-09-03 LAB — TSH: TSH: 1.92 u[IU]/mL (ref 0.35–4.50)

## 2016-09-03 LAB — LIPID PANEL
Cholesterol: 211 mg/dL — ABNORMAL HIGH (ref 0–200)
HDL: 56.8 mg/dL
LDL Cholesterol: 121 mg/dL — ABNORMAL HIGH (ref 0–99)
NonHDL: 153.82
Total CHOL/HDL Ratio: 4
Triglycerides: 163 mg/dL — ABNORMAL HIGH (ref 0.0–149.0)
VLDL: 32.6 mg/dL (ref 0.0–40.0)

## 2016-09-03 MED ORDER — CYANOCOBALAMIN 1000 MCG/ML IJ SOLN: 1000.0000 ug | INTRAMUSCULAR | 0 refills | Status: DC

## 2016-09-03 MED ORDER — "INSULIN SYRINGE/NEEDLE 28G X 1/2"" 1 ML MISC": 2 refills | Status: DC

## 2016-09-03 NOTE — Progress Notes (Signed)
Pre visit review using our clinic review tool, if applicable. No additional management support is needed unless otherwise documented below in the visit note. 

## 2016-09-03 NOTE — Patient Instructions (Signed)
It was very nice meeting you!  Let [name] give you a b12 injection with the medication I sent to your pharmacy,  This can be repeated every 2 to 4 weeks if it is helpful   Menopause is a normal process in which your reproductive ability comes to an end. This process happens gradually over a span of months to years, usually between the ages of 86 and 64. Menopause is complete when you have missed 12 consecutive menstrual periods. It is important to talk with your health care provider about some of the most common conditions that affect postmenopausal women, such as heart disease, cancer, and bone loss (osteoporosis). Adopting a healthy lifestyle and getting preventive care can help to promote your health and wellness. Those actions can also lower your chances of developing some of these common conditions. WHAT SHOULD I KNOW ABOUT MENOPAUSE? During menopause, you may experience a number of symptoms, such as:  Moderate-to-severe hot flashes.  Night sweats.  Decrease in sex drive.  Mood swings.  Headaches.  Tiredness.  Irritability.  Memory problems.  Insomnia. Choosing to treat or not to treat menopausal changes is an individual decision that you make with your health care provider. WHAT SHOULD I KNOW ABOUT HORMONE REPLACEMENT THERAPY AND SUPPLEMENTS? Hormone therapy products are effective for treating symptoms that are associated with menopause, such as hot flashes and night sweats. Hormone replacement carries certain risks, especially as you become older. If you are thinking about using estrogen or estrogen with progestin treatments, discuss the benefits and risks with your health care provider. WHAT SHOULD I KNOW ABOUT HEART DISEASE AND STROKE? Heart disease, heart attack, and stroke become more likely as you age. This may be due, in part, to the hormonal changes that your body experiences during menopause. These can affect how your body processes dietary fats, triglycerides, and  cholesterol. Heart attack and stroke are both medical emergencies. There are many things that you can do to help prevent heart disease and stroke:  Have your blood pressure checked at least every 1-2 years. High blood pressure causes heart disease and increases the risk of stroke.  If you are 66-2 years old, ask your health care provider if you should take aspirin to prevent a heart attack or a stroke.  Do not use any tobacco products, including cigarettes, chewing tobacco, or electronic cigarettes. If you need help quitting, ask your health care provider.  It is important to eat a healthy diet and maintain a healthy weight.  Be sure to include plenty of vegetables, fruits, low-fat dairy products, and lean protein.  Avoid eating foods that are high in solid fats, added sugars, or salt (sodium).  Get regular exercise. This is one of the most important things that you can do for your health.  Try to exercise for at least 150 minutes each week. The type of exercise that you do should increase your heart rate and make you sweat. This is known as moderate-intensity exercise.  Try to do strengthening exercises at least twice each week. Do these in addition to the moderate-intensity exercise.  Know your numbers.Ask your health care provider to check your cholesterol and your blood glucose. Continue to have your blood tested as directed by your health care provider. WHAT SHOULD I KNOW ABOUT CANCER SCREENING? There are several types of cancer. Take the following steps to reduce your risk and to catch any cancer development as early as possible. Breast Cancer  Practice breast self-awareness.  This means understanding how your  breasts normally appear and feel.  It also means doing regular breast self-exams. Let your health care provider know about any changes, no matter how small.  If you are 44 or older, have a clinician do a breast exam (clinical breast exam or CBE) every year. Depending on  your age, family history, and medical history, it may be recommended that you also have a yearly breast X-ray (mammogram).  If you have a family history of breast cancer, talk with your health care provider about genetic screening.  If you are at high risk for breast cancer, talk with your health care provider about having an MRI and a mammogram every year.  Breast cancer (BRCA) gene test is recommended for women who have family members with BRCA-related cancers. Results of the assessment will determine the need for genetic counseling and BRCA1 and for BRCA2 testing. BRCA-related cancers include these types:  Breast. This occurs in males or females.  Ovarian.  Tubal. This may also be called fallopian tube cancer.  Cancer of the abdominal or pelvic lining (peritoneal cancer).  Prostate.  Pancreatic. Cervical, Uterine, and Ovarian Cancer Your health care provider may recommend that you be screened regularly for cancer of the pelvic organs. These include your ovaries, uterus, and vagina. This screening involves a pelvic exam, which includes checking for microscopic changes to the surface of your cervix (Pap test).  For women ages 21-65, health care providers may recommend a pelvic exam and a Pap test every three years. For women ages 96-65, they may recommend the Pap test and pelvic exam, combined with testing for human papilloma virus (HPV), every five years. Some types of HPV increase your risk of cervical cancer. Testing for HPV may also be done on women of any age who have unclear Pap test results.  Other health care providers may not recommend any screening for nonpregnant women who are considered low risk for pelvic cancer and have no symptoms. Ask your health care provider if a screening pelvic exam is right for you.  If you have had past treatment for cervical cancer or a condition that could lead to cancer, you need Pap tests and screening for cancer for at least 20 years after your  treatment. If Pap tests have been discontinued for you, your risk factors (such as having a new sexual partner) need to be reassessed to determine if you should start having screenings again. Some women have medical problems that increase the chance of getting cervical cancer. In these cases, your health care provider may recommend that you have screening and Pap tests more often.  If you have a family history of uterine cancer or ovarian cancer, talk with your health care provider about genetic screening.  If you have vaginal bleeding after reaching menopause, tell your health care provider.  There are currently no reliable tests available to screen for ovarian cancer. Lung Cancer Lung cancer screening is recommended for adults 12-23 years old who are at high risk for lung cancer because of a history of smoking. A yearly low-dose CT scan of the lungs is recommended if you:  Currently smoke.  Have a history of at least 30 pack-years of smoking and you currently smoke or have quit within the past 15 years. A pack-year is smoking an average of one pack of cigarettes per day for one year. Yearly screening should:  Continue until it has been 15 years since you quit.  Stop if you develop a health problem that would prevent you from  having lung cancer treatment. Colorectal Cancer  This type of cancer can be detected and can often be prevented.  Routine colorectal cancer screening usually begins at age 63 and continues through age 62.  If you have risk factors for colon cancer, your health care provider may recommend that you be screened at an earlier age.  If you have a family history of colorectal cancer, talk with your health care provider about genetic screening.  Your health care provider may also recommend using home test kits to check for hidden blood in your stool.  A small camera at the end of a tube can be used to examine your colon directly (sigmoidoscopy or colonoscopy). This is  done to check for the earliest forms of colorectal cancer.  Direct examination of the colon should be repeated every 5-10 years until age 65. However, if early forms of precancerous polyps or small growths are found or if you have a family history or genetic risk for colorectal cancer, you may need to be screened more often. Skin Cancer  Check your skin from head to toe regularly.  Monitor any moles. Be sure to tell your health care provider:  About any new moles or changes in moles, especially if there is a change in a mole's shape or color.  If you have a mole that is larger than the size of a pencil eraser.  If any of your family members has a history of skin cancer, especially at a young age, talk with your health care provider about genetic screening.  Always use sunscreen. Apply sunscreen liberally and repeatedly throughout the day.  Whenever you are outside, protect yourself by wearing long sleeves, pants, a wide-brimmed hat, and sunglasses. WHAT SHOULD I KNOW ABOUT OSTEOPOROSIS? Osteoporosis is a condition in which bone destruction happens more quickly than new bone creation. After menopause, you may be at an increased risk for osteoporosis. To help prevent osteoporosis or the bone fractures that can happen because of osteoporosis, the following is recommended:  If you are 74-43 years old, get at least 1,000 mg of calcium and at least 600 mg of vitamin D per day.  If you are older than age 48 but younger than age 40, get at least 1,200 mg of calcium and at least 600 mg of vitamin D per day.  If you are older than age 58, get at least 1,200 mg of calcium and at least 800 mg of vitamin D per day. Smoking and excessive alcohol intake increase the risk of osteoporosis. Eat foods that are rich in calcium and vitamin D, and do weight-bearing exercises several times each week as directed by your health care provider. WHAT SHOULD I KNOW ABOUT HOW MENOPAUSE AFFECTS [place]? Depression may occur at any age, but it is more common as you become older. Common symptoms of depression include:  Low or sad mood.  Changes in sleep patterns.  Changes in appetite or eating patterns.  Feeling an overall lack of motivation or enjoyment of activities that you previously enjoyed.  Frequent crying spells. Talk with your health care provider if you think that you are experiencing depression. WHAT SHOULD I KNOW ABOUT IMMUNIZATIONS? It is important that you get and maintain your immunizations. These include:  Tetanus, diphtheria, and pertussis (Tdap) booster vaccine.  Influenza every year before the flu season begins.  Pneumonia vaccine.  Shingles vaccine. Your health care provider may also recommend other immunizations.   This information is not intended to replace advice  given to you by your health care provider. Make sure you discuss any questions you have with your health care provider.   Document Released: 12/31/2005 Document Revised: 11/29/2014 Document Reviewed: 07/11/2014 Elsevier Interactive Patient Education [employer].

## 2016-09-03 NOTE — Progress Notes (Addendum)
Patient ID: [name], female    DOB: 09-Jul-1941  Age: 75 y.o. MRN: [id number]  The patient is here for annual physical examination. Previously seen by Dr Gilford Rile, referred by daughter who is  Mr. [name] caregiver [name]. [name]  Had wellness by [name] on Sept 5th   Had bladder tack 3 yrs ago by [name],  Doing fine, no recent pelvic exams,  No issues.  Annual mammogram is due,   Colonoscopy feb 2017 by [facility] . Sees [name],  holter monitor in 2017  ,  PVCS,  toprol changed to Dilt and simvastatin decreasd to 10 mg Needs derm for recurrent skin lesion right posteriori achilles  tendon , was removed 10 yars ago by [name],  Has returned.   The risk factors are reflected in the social history.  The roster of all physicians providing medical care to patient - is listed in the Snapshot section of the chart.  Activities of daily living:  The patient is 100% independent in all ADLs: dressing, toileting, feeding as well as independent mobility  Home safety : The patient has smoke detectors in the home. They wear seatbelts.  There are no firearms at home. There is no violence in the home.   There is no risks for hepatitis, STDs or HIV. There is no   history of blood transfusion. They have no travel history to infectious disease endemic areas of the world.  The patient has seen their dentist in the last six month. They have seen their eye doctor in the last year. They admit to slight hearing difficulty with regard to whispered voices and some television programs.  They have deferred audiologic testing in the last year.  They do not  have excessive sun exposure. Discussed the need for sun protection: hats, long sleeves and use of sunscreen if there is significant sun exposure.   Diet: the importance of a healthy diet is discussed. They do have a healthy diet.  The benefits of regular aerobic exercise were discussed. She walks 4 times per week ,  20 minutes.   Depression screen: there are  no signs or vegative symptoms of depression- irritability, change in appetite, anhedonia, sadness/tearfullness.  Cognitive assessment: the patient manages all their financial and personal affairs and is actively engaged. They could relate day,date,year and events; recalled 2/3 objects at 3 minutes; performed clock-face test normally.  The following portions of the patient's history were reviewed and updated as appropriate: allergies, current medications, past family history, past medical history,  past surgical history, past social history  and problem list.  Visual acuity was not assessed per patient preference since she has regular follow up with her ophthalmologist. Hearing and body mass index were assessed and reviewed.   During the course of the visit the patient was educated and counseled about appropriate screening and preventive services including : fall prevention , diabetes screening, nutrition counseling, colorectal cancer screening, and recommended immunizations.    CC: The primary encounter diagnosis was Familial hyperlipidemia. Diagnoses of Hypothyroidism, unspecified type, Vitamin D deficiency, Elevated LFTs, Breast cancer screening, Medicare annual wellness visit, subsequent, Visit for preventive health examination, Chest pain, unspecified type, and Fatigue, unspecified type were also pertinent to this visit.  Fatigue.  Feels tired by end of day,  Less energy to exercise,  Sees her friends getting B12 injections , wonders if she is B12 deficient.  Sleeps well.  Husband has limited mobility due to prior CVA and she has increased caregiver responsibility but does have several  half days to herself weekly.   History [name] has a past medical history of Hyperlipidemia and Thyroid disease.   She has a past surgical history that includes Abdominal hysterectomy; Vaginal prolapse repair (2014); and Colonoscopy with propofol (N/A, 12/29/2015).   Her family history includes Breast cancer in  her mother; Cancer in her brother, brother, brother, sister, and sister; Diabetes in her brother, brother, brother, father, and mother; Heart attack in her brother and mother; Pancreatic cancer in her sister; Stroke in her father.She reports that she has quit smoking. Her smoking use included Cigarettes. She quit after 1.00 year of use. She has never used smokeless tobacco. She reports that she does not drink alcohol or use drugs.  Outpatient Medications Prior to Visit  Medication Sig Dispense Refill  . aspirin EC 81 MG tablet Take 81 mg by mouth daily.      . cholecalciferol (VITAMIN D) 1000 UNITS tablet Take 1,000 Units by mouth daily.    Marland Kitchen diltiazem (CARDIZEM CD) 120 MG 24 hr capsule TAKE 1 CAPSULE (120 MG TOTAL) BY MOUTH DAILY. 30 capsule 3  . glucosamine-chondroitin ([facility] GLUCOSAMINE-CHONDROITIN) 500-400 MG tablet Take 1 tablet by mouth daily.     Marland Kitchen levothyroxine (SYNTHROID, LEVOTHROID) 50 MCG tablet TAKE 1 TABLET (50 MCG TOTAL) BY MOUTH DAILY. 90 tablet 0  . meloxicam (MOBIC) 15 MG tablet TAKE 1 TABLET(S) EVERY DAY AS NEEDED BY MOUTH WITH FOOD. 30 tablet 3  . Misc Natural Products (OSTEO BI-FLEX JOINT SHIELD PO) Take 1 tablet by mouth daily.    . Multiple Vitamins-Minerals (MULTIVITAMIN WITH MINERALS) tablet Take 1 tablet by mouth daily.      . simvastatin (ZOCOR) 10 MG tablet TAKE 1 TABLET (10 MG TOTAL) BY MOUTH AT BEDTIME. 30 tablet 3   No facility-administered medications prior to visit.     Review of Systems   Patient denies headache, fevers, malaise, unintentional weight loss, skin rash, eye pain, sinus congestion and sinus pain, sore throat, dysphagia,  hemoptysis , cough, dyspnea, wheezing, chest pain, palpitations, orthopnea, edema, abdominal pain, nausea, melena, diarrhea, constipation, flank pain, dysuria, hematuria, urinary  Frequency, nocturia, numbness, tingling, seizures,  Focal weakness, Loss of consciousness,  Tremor, insomnia, depression, anxiety, and suicidal ideation.      Objective:  BP 134/78   Pulse 62   Temp 98.1 F (36.7 C) (Oral)   Resp 20   Wt 152 lb (68.9 kg)   SpO2 97%   BMI 24.53 kg/m   Physical Exam   General appearance: alert, cooperative and appears stated age Head: Normocephalic, without obvious abnormality, atraumatic Eyes: conjunctivae/corneas clear. PERRL, EOM's intact. Fundi benign. Ears: normal TM's and external ear canals both ears Nose: Nares normal. Septum midline. Mucosa normal. No drainage or sinus tenderness. Throat: lips, mucosa, and tongue normal; teeth and gums normal Neck: no adenopathy, no carotid bruit, no JVD, supple, symmetrical, trachea midline and thyroid not enlarged, symmetric, no tenderness/mass/nodules Lungs: clear to auscultation bilaterally Breasts: normal appearance, no masses or tenderness Heart: regular rate and rhythm, S1, S2 normal, no murmur, click, rub or gallop Abdomen: soft, non-tender; bowel sounds normal; no masses,  no organomegaly Extremities: extremities normal, atraumatic, no cyanosis or edema Pulses: 2+ and symmetric Skin: Skin color, texture, turgor normal. No rashes or lesions Neurologic: Alert and oriented X 3, normal strength and tone. Normal symmetric reflexes. Normal coordination and gait.     Assessment & Plan:   Problem List Items Addressed This Visit    Familial hyperlipidemia - Primary  Using the American Heart Association/Americal college of Cardiology risk calculator, her  10 year risk of coronary artery event is between 17 and 23%, despite using simvastatin.  I recommend changing her statin to atorvastatin and await her response.       Relevant Orders   Lipid panel (Completed)   VITAMIN D 25 Hydroxy (Vit-D Deficiency, Fractures) (Completed)   Hypothyroidism    Thyroid function is WNL on current dose.  No current changes needed.   Lab Results  Component Value Date   TSH 1.92 09/03/2016         Relevant Orders   TSH (Completed)   Medicare annual wellness  visit, subsequent    Annual Medicare wellness  exam was done as well as a comprehensive physical exam and management of acute and chronic conditions .  During the course of the visit the patient was educated and counseled about appropriate screening and preventive services including : fall prevention , diabetes screening, nutrition counseling, colorectal cancer screening, and recommended immunizations.  Printed recommendations for health maintenance screenings was given.       RESOLVED: Elevated LFTs   Relevant Orders   Comprehensive metabolic panel (Completed)   Pain in the chest    She was referred to Dr Fletcher Anon in 2016 and underwent a  nuc med study Nov 06 2015 . Normal       Visit for preventive health examination    Annual comprehensive preventive exam was done as well as an evaluation and management of chronic conditions .  During the course of the visit the patient was educated and counseled about appropriate screening and preventive services including :  diabetes screening, lipid analysis with projected  10 year  risk for CAD  Which is 23 % using the AHA/ACC risk calculator, , nutrition counseling, colorectal cancer screening, and recommended immunizations.  Printed recommendations for health maintenance screenings was given.   Lab Results  Component Value Date   CHOL 211 (H) 09/03/2016   HDL 56.80 09/03/2016   LDLCALC 121 (H) 09/03/2016   LDLDIRECT 202.9 03/02/2013   TRIG 163.0 (H) 09/03/2016   CHOLHDL 4 09/03/2016         Fatigue    Screening labs normal.  No history of snoring.  Recommended participating in regular exercise program with goal of achieving a minimum of 30 minutes of aerobic activity 5 days per week.  B12 injections requested as a trial  Med sent to pharmacy,  Daughter to provide administration   Lab Results  Component Value Date   CREATININE 0.79 12/22/2015   Lab Results  Component Value Date   ALT 14 12/22/2015   AST 18 12/22/2015   ALKPHOS 69 12/22/2015    BILITOT 0.6 12/22/2015   Lab Results  Component Value Date   TSH 1.68 12/22/2015   Lab Results  Component Value Date   WBC 8.6 12/22/2015   HGB 14.7 12/22/2015   HCT 43.8 12/22/2015   MCV 92.7 12/22/2015   PLT 248.0 12/22/2015        Vitamin D deficiency   Relevant Orders   VITAMIN D 25 Hydroxy (Vit-D Deficiency, Fractures) (Completed)    Other Visit Diagnoses    Breast cancer screening       Relevant Orders   MM SCREENING BREAST TOMO BILATERAL      I am having Ms. Minion start on cyanocobalamin and INS SYRINGE/NEEDLE 1CC/28G. I am also having her maintain her aspirin EC, multivitamin with minerals, Misc Natural Products (OSTEO BI-FLEX JOINT  SHIELD PO), meloxicam, glucosamine-chondroitin, cholecalciferol, levothyroxine, diltiazem, and simvastatin.  Meds ordered this encounter  Medications  . cyanocobalamin (,VITAMIN B-12,) 1000 MCG/ML injection    Sig: Inject 1 mL (1,000 mcg total) into the muscle every 30 (thirty) days.    Dispense:  10 mL    Refill:  0  . INS SYRINGE/NEEDLE 1CC/28G (B-D INSULIN SYRINGE 1CC/28G) 28G X 1/2" 1 ML MISC    Sig: For use with B12 injections    Dispense:  10 each    Refill:  2    There are no discontinued medications.  Follow-up: Return in about 6 months (around 03/04/2017).   [name], MD

## 2016-09-05 DIAGNOSIS — R5383 Other fatigue: Secondary | ICD-10-CM | POA: Insufficient documentation

## 2016-09-05 DIAGNOSIS — Z01818 Encounter for other preprocedural examination: Secondary | ICD-10-CM | POA: Insufficient documentation

## 2016-09-05 DIAGNOSIS — Z0000 Encounter for general adult medical examination without abnormal findings: Secondary | ICD-10-CM | POA: Insufficient documentation

## 2016-09-05 NOTE — Assessment & Plan Note (Signed)
Screening labs normal.  No history of snoring.  Recommended participating in regular exercise program with goal of achieving a minimum of 30 minutes of aerobic activity 5 days per week.  B12 injections requested as a trial  Med sent to pharmacy,  Daughter to provide administration   Lab Results  Component Value Date   CREATININE 0.79 12/22/2015   Lab Results  Component Value Date   ALT 14 12/22/2015   AST 18 12/22/2015   ALKPHOS 69 12/22/2015   BILITOT 0.6 12/22/2015   Lab Results  Component Value Date   TSH 1.68 12/22/2015   Lab Results  Component Value Date   WBC 8.6 12/22/2015   HGB 14.7 12/22/2015   HCT 43.8 12/22/2015   MCV 92.7 12/22/2015   PLT 248.0 12/22/2015

## 2016-09-05 NOTE — Assessment & Plan Note (Addendum)
Using the American Heart Association/Americal college of Cardiology risk calculator, her  10 year risk of coronary artery event is between 17 and 23%, despite using simvastatin.  I recommend changing her statin to atorvastatin and await her response.

## 2016-09-05 NOTE — Assessment & Plan Note (Signed)
Thyroid function is WNL on current dose.  No current changes needed.   Lab Results  Component Value Date   TSH 1.92 09/03/2016

## 2016-09-05 NOTE — Assessment & Plan Note (Addendum)
Annual comprehensive preventive exam was done as well as an evaluation and management of chronic conditions .  During the course of the visit the patient was educated and counseled about appropriate screening and preventive services including :  diabetes screening, lipid analysis with projected  10 year  risk for CAD  Which is 23 % using the AHA/ACC risk calculator, , nutrition counseling, colorectal cancer screening, and recommended immunizations.  Printed recommendations for health maintenance screenings was given.   Lab Results  Component Value Date   CHOL 211 (H) 09/03/2016   HDL 56.80 09/03/2016   LDLCALC 121 (H) 09/03/2016   LDLDIRECT 202.9 03/02/2013   TRIG 163.0 (H) 09/03/2016   CHOLHDL 4 09/03/2016

## 2016-09-05 NOTE — Assessment & Plan Note (Addendum)
She was referred to Dr Fletcher Anon in 2016 and underwent a  nuc med study Nov 06 2015 . Normal

## 2016-09-05 NOTE — Assessment & Plan Note (Signed)

## 2016-09-07 ENCOUNTER — Encounter: Payer: Self-pay | Admitting: Internal Medicine

## 2016-09-07 ENCOUNTER — Encounter: Payer: Self-pay | Admitting: *Deleted

## 2016-09-07 ENCOUNTER — Other Ambulatory Visit: Payer: Self-pay | Admitting: Internal Medicine

## 2016-09-09 ENCOUNTER — Telehealth: Payer: Self-pay | Admitting: *Deleted

## 2016-09-09 NOTE — Telephone Encounter (Signed)
FYI Patient has agreed to have her cholesterol medication increased.  Pharmacy [facility]

## 2016-09-09 NOTE — Telephone Encounter (Signed)
Dr Fletcher Anon reduced the simvastatin dose bc of the interaction with diltiazem at higher doses.  At the lower dose,  it is not lowering her cholesterol enough.  Thus the switch to atorvastatin ,, a higher potency statin .

## 2016-09-09 NOTE — Telephone Encounter (Signed)
Dr. Fletcher Anon lowered patient medication due to medication patient taking for heart, Patient stated she would change if you thought this was the better plan of treatment. Patient agrees to change if you think appropriate to lipitor.

## 2016-09-14 NOTE — Telephone Encounter (Signed)
Patient notified and voiced understanding,

## 2016-09-15 MED ORDER — ROSUVASTATIN CALCIUM 10 MG PO TABS: 10.0000 mg | ORAL_TABLET | Freq: Every day | ORAL | 3 refills | Status: DC

## 2016-09-15 MED ORDER — ATORVASTATIN CALCIUM 20 MG PO TABS: 20.0000 mg | ORAL_TABLET | Freq: Every day | ORAL | 3 refills | Status: DC

## 2016-09-15 NOTE — Telephone Encounter (Signed)
Patient went to the pharmacy today to pick up her new Cholesterol medication. The pharmacist informed her that no new prescriptions were on file. Per the phone note Lipitor should have been sent to the pharmacy.

## 2016-09-15 NOTE — Telephone Encounter (Signed)
[facility] [place] [facility]  Pt contact [phone]

## 2016-09-15 NOTE — Telephone Encounter (Signed)
Can you advise what dose of Lipitor she is suppose to be on as she agreed with your plan. thanks

## 2016-09-15 NOTE — Telephone Encounter (Signed)
The same interaction occurs with atorvastatin,  So ii sent rosuvastatin instead ,  It does not have the same potentional  interaction with diltiazem

## 2016-09-15 NOTE — Telephone Encounter (Signed)
Spoke with the patient, reviewed change in meds and she will check with the pharmacy and pick up the new crestor Rx, thanks

## 2016-09-15 NOTE — Telephone Encounter (Signed)
Which dose of Lipitor.

## 2016-09-29 ENCOUNTER — Ambulatory Visit
Admission: EM | Admit: 2016-09-29 | Discharge: 2016-09-29 | Disposition: A | Discharge status: Home / Self Care | Payer: Medicare Other | Attending: Family Medicine | Admitting: Family Medicine

## 2016-09-29 ENCOUNTER — Encounter: Payer: Self-pay | Admitting: *Deleted

## 2016-09-29 DIAGNOSIS — S81812A Laceration without foreign body, left lower leg, initial encounter: Secondary | ICD-10-CM

## 2016-09-29 MED ORDER — TETANUS-DIPHTH-ACELL PERTUSSIS 5-2.5-18.5 LF-MCG/0.5 IM SUSP: 0.5000 mL | Freq: Once | INTRAMUSCULAR | Status: DC

## 2016-09-29 MED ORDER — TETANUS-DIPHTH-ACELL PERTUSSIS 5-2.5-18.5 LF-MCG/0.5 IM SUSP
0.5000 mL | Freq: Once | INTRAMUSCULAR | Status: AC
  Administered 2016-09-29: 0.5 mL via INTRAMUSCULAR

## 2016-09-29 NOTE — ED Triage Notes (Signed)
Patient was shaving her legs this am when she cut her lower left leg. Pressure bandage applied by patient stopped the bleeding.

## 2016-09-29 NOTE — ED Provider Notes (Signed)
[facility]    CSN: [id number] Arrival date & time: 09/29/16  1049     History   Chief Complaint Chief Complaint  Patient presents with  . Laceration    HPI KAMPBELL DIFELICE is a 75 y.o. female.   She reports shaving her legs an hour to go when she nicked her left leg and subsequently had bleeding. All her efforts to stop the bleeding have been unsuccessful she thinks she nicked one the peripheral varicosities. Came here because she could not stop the bleeding. She is on a blood thinner of a baby aspirin daily. No major medical problems she is on cholesterol medicine, has hypertension low back pain vitamin D deficiency. She's had abdominal hysterectomy. Her family is positive for breast cancer heart attacks diabetes and strokes. He is a former smoker.   The history is provided by the patient. No language interpreter was used.  Laceration  Location:  Leg Depth:  Cutaneous Laceration mechanism:  Razor Pain details:    Severity:  No pain   Progression:  Unchanged Tetanus status:  Unknown   Past Medical History:  Diagnosis Date  . Hyperlipidemia   . Thyroid disease     Patient Active Problem List   Diagnosis Date Noted  . Visit for preventive health examination 09/05/2016  . Fatigue 09/05/2016  . Chest pain 11/04/2015  . PVC's (premature ventricular contractions) 11/04/2015  . Pain in the chest 11/03/2015  . Clavicular area fullness 11/03/2015  . Elevated BP 09/17/2015  . Tinnitus 09/17/2015  . Family history of colon cancer 09/17/2015  . Low back pain 06/24/2015  . Medicare annual wellness visit, subsequent 12/07/2013  . Left carotid bruit 12/07/2013  . Varicose veins of lower extremities with other complications [id number]  . Vitamin D deficiency 08/04/2012  . Familial hyperlipidemia 11/17/2011  . Hypothyroidism 11/17/2011    Past Surgical History:  Procedure Laterality Date  . ABDOMINAL HYSTERECTOMY     In 30's  . COLONOSCOPY WITH PROPOFOL  N/A 12/29/2015   Procedure: COLONOSCOPY WITH PROPOFOL;  Surgeon: Hulen Luster, MD;  Location: [facility] ENDOSCOPY;  Service: Gastroenterology;  Laterality: N/A;  . VAGINAL PROLAPSE REPAIR  2014   Dr. Enzo Bi    OB History    No data available       Home Medications    Prior to Admission medications   Medication Sig Start Date End Date Taking? Authorizing Provider  aspirin EC 81 MG tablet Take 81 mg by mouth daily.     Yes Historical Provider, MD  cholecalciferol (VITAMIN D) 1000 UNITS tablet Take 1,000 Units by mouth daily.   Yes Historical Provider, MD  diltiazem (CARDIZEM CD) 120 MG 24 hr capsule TAKE 1 CAPSULE (120 MG TOTAL) BY MOUTH DAILY. 07/27/16  Yes Wellington Hampshire, MD  glucosamine-chondroitin ([facility] GLUCOSAMINE-CHONDROITIN) 500-400 MG tablet Take 1 tablet by mouth daily.    Yes Historical Provider, MD  levothyroxine (SYNTHROID, LEVOTHROID) 50 MCG tablet TAKE 1 TABLET (50 MCG TOTAL) BY MOUTH DAILY. 09/07/16  Yes [name], MD  meloxicam (MOBIC) 15 MG tablet TAKE 1 TABLET(S) EVERY DAY AS NEEDED BY MOUTH WITH FOOD. 09/17/15  Yes Jackolyn Confer, MD  Misc Natural Products (OSTEO BI-FLEX JOINT SHIELD PO) Take 1 tablet by mouth daily.   Yes Historical Provider, MD  Multiple Vitamins-Minerals (MULTIVITAMIN WITH MINERALS) tablet Take 1 tablet by mouth daily.     Yes Historical Provider, MD  rosuvastatin (CRESTOR) 10 MG tablet Take 1 tablet (10 mg total) by  mouth daily. DISREGARD/ DC  PRIOR RX FOR ATORVASTATIN 09/15/16  Yes [name], MD  cyanocobalamin (,VITAMIN B-12,) 1000 MCG/ML injection Inject 1 mL (1,000 mcg total) into the muscle every 30 (thirty) days. 09/03/16   [name], MD  INS SYRINGE/NEEDLE 1CC/28G (B-D INSULIN SYRINGE 1CC/28G) 28G X 1/2" 1 ML MISC For use with B12 injections 09/03/16   [name], MD    Family History Family History  Problem Relation Age of Onset  . Breast cancer Mother   . Heart attack Mother   . Diabetes Mother   . Stroke Father   .  Diabetes Father   . Pancreatic cancer Sister   . Cancer Sister     pancreatic  . Cancer Brother     liver  . Diabetes Brother   . Cancer Sister     anal  . Heart attack Brother   . Diabetes Brother   . Cancer Brother     stomach  . Cancer Brother   . Diabetes Brother   . Colon cancer Neg Hx     Social History Social History  Substance Use Topics  . Smoking status: Former Smoker    Years: 1.00    Types: Cigarettes  . Smokeless tobacco: Never Used     Comment: Smoked x 1 year in her 26's  . Alcohol use No     Allergies   Prednisone   Review of Systems Review of Systems  All other systems reviewed and are negative.    Physical Exam Triage Vital Signs ED Triage Vitals  Enc Vitals Group     BP 09/29/16 1100 (!) 156/63     Pulse Rate 09/29/16 1100 (!) 54     Resp 09/29/16 1100 16     Temp 09/29/16 1100 98.6 F (37 C)     Temp Source 09/29/16 1100 Oral     SpO2 09/29/16 1100 97 %     Weight 09/29/16 1102 150 lb (68 kg)     Height 09/29/16 1102 5\' 8"  (1.727 m)     Head Circumference --      Peak Flow --      Pain Score 09/29/16 1105 0     Pain Loc --      Pain Edu? --      Excl. in [place]? --    No data found.   Updated Vital Signs BP (!) 156/63 (BP Location: Left Arm)   Pulse (!) 54   Temp 98.6 F (37 C) (Oral)   Resp 16   Ht 5\' 8"  (1.727 m)   Wt 150 lb (68 kg)   SpO2 97%   BMI 22.81 kg/m   Visual Acuity Right Eye Distance:   Left Eye Distance:   Bilateral Distance:    Right Eye Near:   Left Eye Near:    Bilateral Near:     Physical Exam  Constitutional: She appears well-developed and well-nourished.  HENT:  Head: Normocephalic and atraumatic.  Eyes: Pupils are equal, round, and reactive to light.  Neck: Normal range of motion.  Pulmonary/Chest: Effort normal.  Musculoskeletal: She exhibits no edema or tenderness.       Left lower leg: She exhibits laceration.       Legs: Small area less than a centimeter this actively bleeding    Neurological: She is alert.  Skin: Skin is warm.  Psychiatric: She has a normal mood and affect.  Vitals reviewed.    UC Treatments / Results  Labs (  all labs ordered are listed, but only abnormal results are displayed) Labs Reviewed - No data to display  EKG  EKG Interpretation None       Radiology No results found.  Procedures .Marland KitchenLaceration Repair Date/Time: 09/29/2016 11:46 AM Performed by: Frederich Cha Authorized by: Frederich Cha   Consent:    Consent obtained:  Verbal   Consent given by:  Patient Anesthesia (see MAR for exact dosages):    Anesthesia method:  None Laceration details:    Location:  Leg   Leg location:  L lower leg   Length (cm):  0.5 Repair type:    Repair type:  Simple Exploration:    Hemostasis achieved with:  Tourniquet   Contaminated: no   Treatment:    Area cleansed with:  Saline   Visualized foreign bodies/material removed: no   Skin repair:    Repair method:  Tissue adhesive Approximation:    Approximation:  Close Post-procedure details:    Dressing:  Non-adherent dressing   Patient tolerance of procedure:  Tolerated well, no immediate complications Comments:     By playing take it him to stop blood flow adhesive Dermabond was placed with good hemostasis and approximation of the wound and sensation of further bleeding   (including critical care time)  Medications Ordered in UC Medications - No data to display   Initial Impression / Assessment and Plan / UC Course  I have reviewed the triage vital signs and the nursing notes.  Pertinent labs & imaging results that were available during my care of the patient were reviewed by me and considered in my medical decision making (see chart for details).  Clinical Course     Patient be given tetanus since she does not know when the last one was and Dermabond has worked well as far as stopping the bleeding follow-up PCP as needed  Final Clinical Impressions(s) / UC Diagnoses    Final diagnoses:  Laceration of left lower extremity, initial encounter    New Prescriptions New Prescriptions   No medications on file     Frederich Cha, MD 09/29/16 1150

## 2016-10-04 ENCOUNTER — Other Ambulatory Visit: Payer: Self-pay | Admitting: Internal Medicine

## 2016-10-04 NOTE — Telephone Encounter (Signed)
Establishing care with you 03/04/17 last filled by Dr Gilford Rile 09/17/15 30 3rf

## 2016-10-04 NOTE — Telephone Encounter (Signed)
Looks like last refill was sent in 09/17/15 by Dr. Gilford Rile. Last OV 09/03/16.

## 2016-10-11 ENCOUNTER — Ambulatory Visit: Payer: Medicare Other

## 2016-10-18 ENCOUNTER — Ambulatory Visit
Admission: RE | Admit: 2016-10-18 | Discharge: 2016-10-18 | Disposition: A | Discharge status: Home / Self Care | Payer: Medicare Other | Source: Ambulatory Visit | Attending: Internal Medicine | Admitting: Internal Medicine

## 2016-10-18 DIAGNOSIS — Z1231 Encounter for screening mammogram for malignant neoplasm of breast: Secondary | ICD-10-CM | POA: Diagnosis not present

## 2016-10-18 DIAGNOSIS — Z1239 Encounter for other screening for malignant neoplasm of breast: Secondary | ICD-10-CM

## 2016-11-16 DIAGNOSIS — M1712 Unilateral primary osteoarthritis, left knee: Secondary | ICD-10-CM | POA: Diagnosis not present

## 2016-11-20 ENCOUNTER — Other Ambulatory Visit: Payer: Self-pay | Admitting: Cardiovascular Disease

## 2016-11-27 ENCOUNTER — Other Ambulatory Visit: Payer: Self-pay | Admitting: Internal Medicine

## 2016-12-25 ENCOUNTER — Other Ambulatory Visit: Payer: Self-pay | Admitting: Cardiovascular Disease

## 2017-01-17 DIAGNOSIS — L814 Other melanin hyperpigmentation: Secondary | ICD-10-CM | POA: Diagnosis not present

## 2017-01-17 DIAGNOSIS — L820 Inflamed seborrheic keratosis: Secondary | ICD-10-CM | POA: Diagnosis not present

## 2017-01-17 DIAGNOSIS — L821 Other seborrheic keratosis: Secondary | ICD-10-CM | POA: Diagnosis not present

## 2017-01-29 ENCOUNTER — Other Ambulatory Visit: Payer: Self-pay | Admitting: Internal Medicine

## 2017-01-31 NOTE — Telephone Encounter (Signed)
REFILLED

## 2017-01-31 NOTE — Telephone Encounter (Signed)
Last OV 10/17 ok to refill Mobic last fill 10/05/16

## 2017-02-22 DIAGNOSIS — H02403 Unspecified ptosis of bilateral eyelids: Secondary | ICD-10-CM | POA: Diagnosis not present

## 2017-03-04 ENCOUNTER — Ambulatory Visit (INDEPENDENT_AMBULATORY_CARE_PROVIDER_SITE_OTHER): Payer: Medicare Other | Admitting: Internal Medicine

## 2017-03-04 ENCOUNTER — Encounter: Payer: Self-pay | Admitting: Internal Medicine

## 2017-03-04 VITALS — BP 118/60 | HR 51 | Temp 97.9°F | Resp 15 | Ht 68.0 in | Wt 151.0 lb

## 2017-03-04 DIAGNOSIS — I493 Ventricular premature depolarization: Secondary | ICD-10-CM

## 2017-03-04 DIAGNOSIS — E559 Vitamin D deficiency, unspecified: Secondary | ICD-10-CM

## 2017-03-04 DIAGNOSIS — E2839 Other primary ovarian failure: Secondary | ICD-10-CM

## 2017-03-04 DIAGNOSIS — R7303 Prediabetes: Secondary | ICD-10-CM

## 2017-03-04 DIAGNOSIS — E039 Hypothyroidism, unspecified: Secondary | ICD-10-CM | POA: Diagnosis not present

## 2017-03-04 DIAGNOSIS — E784 Other hyperlipidemia: Secondary | ICD-10-CM | POA: Diagnosis not present

## 2017-03-04 DIAGNOSIS — E7849 Other hyperlipidemia: Secondary | ICD-10-CM

## 2017-03-04 DIAGNOSIS — E538 Deficiency of other specified B group vitamins: Secondary | ICD-10-CM

## 2017-03-04 DIAGNOSIS — R001 Bradycardia, unspecified: Secondary | ICD-10-CM

## 2017-03-04 DIAGNOSIS — R5383 Other fatigue: Secondary | ICD-10-CM

## 2017-03-04 DIAGNOSIS — I700 Atherosclerosis of aorta: Secondary | ICD-10-CM

## 2017-03-04 LAB — LIPID PANEL
Cholesterol: 186 mg/dL (ref 0–200)
HDL: 63.1 mg/dL (ref >39.00)
LDL Cholesterol: 104 mg/dL — ABNORMAL HIGH (ref 0–99)
NonHDL: 123.36
Total CHOL/HDL Ratio: 3
Triglycerides: 98 mg/dL (ref 0.0–149.0)
VLDL: 19.6 mg/dL (ref 0.0–40.0)

## 2017-03-04 LAB — COMPREHENSIVE METABOLIC PANEL
ALT: 29 U/L (ref 0–35)
AST: 29 U/L (ref 0–37)
Albumin: 4.3 g/dL (ref 3.5–5.2)
Alkaline Phosphatase: 43 U/L (ref 39–117)
BUN: 17 mg/dL (ref 6–23)
CO2: 32 mEq/L (ref 19–32)
Calcium: 9.7 mg/dL (ref 8.4–10.5)
Chloride: 103 mEq/L (ref 96–112)
Creatinine, Ser: 0.81 mg/dL (ref 0.40–1.20)
GFR: 73.11 mL/min (ref >60.00)
Glucose, Bld: 92 mg/dL (ref 70–99)
Potassium: 4.7 mEq/L (ref 3.5–5.1)
Sodium: 140 mEq/L (ref 135–145)
Total Bilirubin: 0.5 mg/dL (ref 0.2–1.2)
Total Protein: 7 g/dL (ref 6.0–8.3)

## 2017-03-04 LAB — VITAMIN D 25 HYDROXY (VIT D DEFICIENCY, FRACTURES): VITD: 53.67 ng/mL (ref 30.00–100.00)

## 2017-03-04 LAB — VITAMIN B12: Vitamin B-12: 705 pg/mL (ref 211–911)

## 2017-03-04 LAB — TSH: TSH: 1.99 u[IU]/mL (ref 0.35–4.50)

## 2017-03-04 LAB — HEMOGLOBIN A1C: Hgb A1c MFr Bld: 6 % (ref 4.6–6.5)

## 2017-03-04 NOTE — Progress Notes (Signed)
Pre visit review using our clinic review tool, if applicable. No additional management support is needed unless otherwise documented below in the visit note. 

## 2017-03-04 NOTE — Progress Notes (Signed)
Subjective:  Patient ID: [name], female    DOB: 11-06-1941  Age: 76 y.o. MRN: [id number]  CC: The primary encounter diagnosis was Acquired hypothyroidism. Diagnoses of Estrogen deficiency, Vitamin D deficiency, Familial hyperlipidemia, Other fatigue, PVC's (premature ventricular contractions), Prediabetes, B12 deficiency, Abdominal aortic atherosclerosis ([place]), and Sinus bradycardia were also pertinent to this visit.  HPI [name] presents for follow up on chronic conditions  last seen in october   Taking meloxicam prn for low back pain , non radiating . Not limiting her ADL's   Takes diltiazem for history of arrhythmia,  VPC's .  Currently bradycardic. Bradycardia discussed,  No syncope , but feels her eneryg level is poor and has occasional orthostasis   Outpatient Medications Prior to Visit  Medication Sig Dispense Refill  . aspirin EC 81 MG tablet Take 81 mg by mouth daily.      . cholecalciferol (VITAMIN D) 1000 UNITS tablet Take 1,000 Units by mouth daily.    Marland Kitchen diltiazem (CARDIZEM CD) 120 MG 24 hr capsule TAKE 1 CAPSULE (120 MG TOTAL) BY MOUTH DAILY. 30 capsule 3  . glucosamine-chondroitin ([facility] GLUCOSAMINE-CHONDROITIN) 500-400 MG tablet Take 1 tablet by mouth daily.     Marland Kitchen levothyroxine (SYNTHROID, LEVOTHROID) 50 MCG tablet TAKE 1 TABLET (50 MCG TOTAL) BY MOUTH DAILY. 90 tablet 1  . meloxicam (MOBIC) 15 MG tablet TAKE 1 TABLET(S) EVERY DAY AS NEEDED BY MOUTH WITH FOOD. 30 tablet 5  . Misc Natural Products (OSTEO BI-FLEX JOINT SHIELD PO) Take 1 tablet by mouth daily.    . Multiple Vitamins-Minerals (MULTIVITAMIN WITH MINERALS) tablet Take 1 tablet by mouth daily.      . rosuvastatin (CRESTOR) 10 MG tablet Take 1 tablet (10 mg total) by mouth daily. DISREGARD/ DC  PRIOR RX FOR ATORVASTATIN 90 tablet 3  . cyanocobalamin (,VITAMIN B-12,) 1000 MCG/ML injection Inject 1 mL (1,000 mcg total) into the muscle every 30 (thirty) days. (Patient not taking: Reported on 03/04/2017)  10 mL 0  . INS SYRINGE/NEEDLE 1CC/28G (B-D INSULIN SYRINGE 1CC/28G) 28G X 1/2" 1 ML MISC For use with B12 injections (Patient not taking: Reported on 03/04/2017) 10 each 2   No facility-administered medications prior to visit.     Review of Systems;  Patient denies headache, fevers, malaise, unintentional weight loss, skin rash, eye pain, sinus congestion and sinus pain, sore throat, dysphagia,  hemoptysis , cough, dyspnea, wheezing, chest pain, palpitations, orthopnea, edema, abdominal pain, nausea, melena, diarrhea, constipation, flank pain, dysuria, hematuria, urinary  Frequency, nocturia, numbness, tingling, seizures,  Focal weakness, Loss of consciousness,  Tremor, insomnia, depression, anxiety, and suicidal ideation.      Objective:  BP 118/60   Pulse (!) 51   Temp 97.9 F (36.6 C) (Oral)   Resp 15   Ht 5\' 8"  (1.727 m)   Wt 151 lb (68.5 kg)   SpO2 98%   BMI 22.96 kg/m   BP Readings from Last 3 Encounters:  03/04/17 118/60  09/29/16 (!) 156/63  09/03/16 134/78    Wt Readings from Last 3 Encounters:  03/04/17 151 lb (68.5 kg)  09/29/16 150 lb (68 kg)  09/03/16 152 lb (68.9 kg)    General appearance: alert, cooperative and appears stated age Ears: normal TM's and external ear canals both ears Throat: lips, mucosa, and tongue normal; teeth and gums normal Neck: no adenopathy, no carotid bruit, supple, symmetrical, trachea midline and thyroid not enlarged, symmetric, no tenderness/mass/nodules Back: symmetric, no curvature. ROM normal. No CVA tenderness.  Lungs: clear to auscultation bilaterally Heart: regular rate and rhythm, S1, S2 normal, no murmur, click, rub or gallop Abdomen: soft, non-tender; bowel sounds normal; no masses,  no organomegaly Pulses: 2+ and symmetric Skin: Skin color, texture, turgor normal. No rashes or lesions Lymph nodes: Cervical, supraclavicular, and axillary nodes normal.  Lab Results  Component Value Date   HGBA1C 6.0 03/04/2017   HGBA1C  6.0 02/13/2013    Lab Results  Component Value Date   CREATININE 0.81 03/04/2017   CREATININE 0.89 09/03/2016   CREATININE 0.9 09/06/2014    Lab Results  Component Value Date   WBC 5.0 06/10/2015   HGB 12.8 06/10/2015   HCT 38.1 06/10/2015   PLT 205.0 06/10/2015   GLUCOSE 92 03/04/2017   CHOL 186 03/04/2017   TRIG 98.0 03/04/2017   HDL 63.10 03/04/2017   LDLDIRECT 202.9 03/02/2013   LDLCALC 104 (H) 03/04/2017   ALT 29 03/04/2017   AST 29 03/04/2017   NA 140 03/04/2017   K 4.7 03/04/2017   CL 103 03/04/2017   CREATININE 0.81 03/04/2017   BUN 17 03/04/2017   CO2 32 03/04/2017   TSH 1.99 03/04/2017   HGBA1C 6.0 03/04/2017   MICROALBUR 0.1 12/07/2013    Mm Screening Breast Tomo Bilateral  Result Date: 10/18/2016 CLINICAL DATA:  Screening. EXAM: 2D DIGITAL SCREENING BILATERAL MAMMOGRAM WITH CAD AND ADJUNCT TOMO COMPARISON:  Previous exam(s). ACR Breast Density Category b: There are scattered areas of fibroglandular density. FINDINGS: There are no findings suspicious for malignancy. Images were processed with CAD. IMPRESSION: No mammographic evidence of malignancy. A result letter of this screening mammogram will be mailed directly to the patient. RECOMMENDATION: Screening mammogram in one year. (Code:SM-B-01Y) BI-RADS CATEGORY  1: Negative. Electronically Signed   By: Claudie Revering M.D.   On: 10/18/2016 16:40    Assessment & Plan:   Problem List Items Addressed This Visit    Abdominal aortic atherosclerosis ([place])    Noted on 2016 CT of abdomen.  Continue crestor , aspirin.       Acquired hypothyroidism - Primary    Thyroid function is WNL on current dose.  No current changes needed.   Lab Results  Component Value Date   TSH 1.99 03/04/2017         Relevant Orders   TSH (Completed)   Familial hyperlipidemia    Managed with crestor,  LDL is 104 .  lfts normal.  No changes today   Lab Results  Component Value Date   CHOL 186 03/04/2017   HDL 63.10 03/04/2017     LDLCALC 104 (H) 03/04/2017   LDLDIRECT 202.9 03/02/2013   TRIG 98.0 03/04/2017   CHOLHDL 3 03/04/2017   Lab Results  Component Value Date   ALT 29 03/04/2017   AST 29 03/04/2017   ALKPHOS 43 03/04/2017   BILITOT 0.5 03/04/2017          Relevant Orders   Lipid panel (Completed)   Other fatigue    Screening labs normal.  No history of snoring.  Recommended participating in regular exercise program with goal of achieving a minimum of 30 minutes of aerobic activity 5 days per week.  No evidence of B12 deficiency,.  Likely due to bradycardia and deconditioning    Lab Results  Component Value Date   CREATININE 0.81 03/04/2017   Lab Results  Component Value Date   NA 140 03/04/2017   K 4.7 03/04/2017   CL 103 03/04/2017   CO2 32 03/04/2017  Lab Results  Component Value Date   WBC 5.0 06/10/2015   HGB 12.8 06/10/2015   HCT 38.1 06/10/2015   MCV 93.8 06/10/2015   PLT 205.0 06/10/2015   Lab Results  Component Value Date   [id number] 568 03/04/2017         PVC's (premature ventricular contractions)    Managed wit the lowest dose of once daily diltiazem.  Given her symptomatic bradycardia,  Will reduce dose to every other day       Relevant Orders   Comprehensive metabolic panel (Completed)   Sinus bradycardia    Secondary to due to use of diltiazem for PVC's.  Will reduce dose to every other day       Vitamin D deficiency    Now normalized.  Continue 1000 IUs daily       Relevant Orders   VITAMIN D 25 Hydroxy (Vit-D Deficiency, Fractures) (Completed)    Other Visit Diagnoses    Estrogen deficiency       Relevant Orders   DG Bone Density   Prediabetes       Relevant Orders   Hemoglobin A1c (Completed)   B12 deficiency       Relevant Orders   Vitamin B12 (Completed)   Intrinsic Factor Antibodies      I have discontinued Ms. Musselman's cyanocobalamin and INS SYRINGE/NEEDLE 1CC/28G. I am also having her maintain her aspirin EC, multivitamin with  minerals, Misc Natural Products (OSTEO BI-FLEX JOINT SHIELD PO), glucosamine-chondroitin, cholecalciferol, levothyroxine, rosuvastatin, diltiazem, and meloxicam.  No orders of the defined types were placed in this encounter.   Medications Discontinued During This Encounter  Medication Reason  . cyanocobalamin (,VITAMIN B-12,) 1000 MCG/ML injection Patient has not taken in last 30 days  . INS SYRINGE/NEEDLE 1CC/28G (B-D INSULIN SYRINGE 1CC/28G) 28G X 1/2" 1 ML MISC Patient has not taken in last 30 days    Follow-up: Return in about 6 months (around 09/03/2017).   [name], MD

## 2017-03-04 NOTE — Patient Instructions (Addendum)
We are trying a reduction in your diltiazem  to every other  day dosing,  bc of your slow heart rate   fasting labs were done today along with b12

## 2017-03-05 ENCOUNTER — Other Ambulatory Visit: Payer: Self-pay | Admitting: Internal Medicine

## 2017-03-06 DIAGNOSIS — R001 Bradycardia, unspecified: Secondary | ICD-10-CM | POA: Insufficient documentation

## 2017-03-06 DIAGNOSIS — I700 Atherosclerosis of aorta: Secondary | ICD-10-CM | POA: Insufficient documentation

## 2017-03-06 LAB — INTRINSIC FACTOR ANTIBODIES: Intrinsic Factor: NEGATIVE

## 2017-03-06 NOTE — Assessment & Plan Note (Signed)
Managed wit the lowest dose of once daily diltiazem.  Given her symptomatic bradycardia,  Will reduce dose to every other day

## 2017-03-06 NOTE — Assessment & Plan Note (Addendum)
Screening labs normal.  No history of snoring.  Recommended participating in regular exercise program with goal of achieving a minimum of 30 minutes of aerobic activity 5 days per week.  No evidence of B12 deficiency,.  Likely due to bradycardia and deconditioning    Lab Results  Component Value Date   CREATININE 0.81 03/04/2017   Lab Results  Component Value Date   NA 140 03/04/2017   K 4.7 03/04/2017   CL 103 03/04/2017   CO2 32 03/04/2017   Lab Results  Component Value Date   WBC 5.0 06/10/2015   HGB 12.8 06/10/2015   HCT 38.1 06/10/2015   MCV 93.8 06/10/2015   PLT 205.0 06/10/2015   Lab Results  Component Value Date   VITAMINB12 705 03/04/2017

## 2017-03-06 NOTE — Assessment & Plan Note (Signed)
Secondary to due to use of diltiazem for PVC's.  Will reduce dose to every other day

## 2017-03-06 NOTE — Assessment & Plan Note (Signed)
Now normalized.  Continue 1000 IUs daily

## 2017-03-06 NOTE — Assessment & Plan Note (Signed)
Noted on 2016 CT of abdomen.  Continue crestor , aspirin.  

## 2017-03-06 NOTE — Assessment & Plan Note (Addendum)
Managed with crestor,  LDL is 104 .  lfts normal.  No changes today   Lab Results  Component Value Date   CHOL 186 03/04/2017   HDL 63.10 03/04/2017   LDLCALC 104 (H) 03/04/2017   LDLDIRECT 202.9 03/02/2013   TRIG 98.0 03/04/2017   CHOLHDL 3 03/04/2017   Lab Results  Component Value Date   ALT 29 03/04/2017   AST 29 03/04/2017   ALKPHOS 43 03/04/2017   BILITOT 0.5 03/04/2017

## 2017-03-06 NOTE — Assessment & Plan Note (Signed)
Thyroid function is WNL on current dose.  No current changes needed.   Lab Results  Component Value Date   TSH 1.99 03/04/2017

## 2017-03-07 ENCOUNTER — Telehealth: Payer: Self-pay | Admitting: Internal Medicine

## 2017-03-07 NOTE — Telephone Encounter (Signed)
Patient advised of below and verbalized understanding . Fasting lab appointment scheduled in 6 month, she is already scheduled for follow up appointment .

## 2017-03-07 NOTE — Telephone Encounter (Signed)
Your vitamin D, thyroid , b12, cholesterol,  liver and kidney function are all normal.  Your  fasting glucose has never been  diagnostic of diabetes; but your A1c continues to suggest that  you are at risk for developing type 2 Diabetes.     I recommend  Cutting back on desserts and starches and when the weather allows, regular participation in a walking program for exercise .  I would like to see you again after we repeat your fasting labs in 6 months      Regards,   Dr. Derrel Nip

## 2017-03-07 NOTE — Telephone Encounter (Signed)
Left voice mail to call back 

## 2017-03-08 ENCOUNTER — Other Ambulatory Visit: Payer: Self-pay

## 2017-03-08 ENCOUNTER — Other Ambulatory Visit: Payer: Self-pay | Admitting: *Deleted

## 2017-03-08 MED ORDER — DILTIAZEM HCL ER COATED BEADS 120 MG PO CP24: 120.0000 mg | ORAL_CAPSULE | Freq: Every day | ORAL | 0 refills | Status: DC

## 2017-03-14 ENCOUNTER — Ambulatory Visit (INDEPENDENT_AMBULATORY_CARE_PROVIDER_SITE_OTHER): Payer: Medicare Other | Admitting: Internal Medicine

## 2017-03-14 ENCOUNTER — Encounter: Payer: Self-pay | Admitting: Internal Medicine

## 2017-03-14 DIAGNOSIS — R300 Dysuria: Secondary | ICD-10-CM | POA: Diagnosis not present

## 2017-03-14 LAB — POCT URINALYSIS DIPSTICK
Bilirubin, UA: NEGATIVE
Blood, UA: NEGATIVE
Glucose, UA: NEGATIVE
Ketones, UA: NEGATIVE
Nitrite, UA: NEGATIVE
Protein, UA: NEGATIVE
Spec Grav, UA: 1.015 (ref 1.010–1.025)
Urobilinogen, UA: 0.2 E.U./dL
pH, UA: 6.5 (ref 5.0–8.0)

## 2017-03-14 LAB — URINALYSIS, MICROSCOPIC ONLY: RBC / HPF: NONE SEEN (ref 0–?)

## 2017-03-14 MED ORDER — CIPROFLOXACIN HCL 250 MG PO TABS: 250.0000 mg | ORAL_TABLET | Freq: Two times a day (BID) | ORAL | 0 refills | Status: DC

## 2017-03-14 NOTE — Progress Notes (Signed)
Pre visit review using our clinic review tool, if applicable. No additional management support is needed unless otherwise documented below in the visit note. 

## 2017-03-14 NOTE — Progress Notes (Signed)
Subjective:  Patient ID: [name], female    DOB: 07/14/1941  Age: 76 y.o. MRN: [id number]  CC: The encounter diagnosis was Dysuria.  HPI [name] presents for evaluation of urinary incontinence accompaned by low  Back pain  sympotms started 4 days ago and are improving.   Urine dipstick positive for LE small,   Fatigue: reduce diltiazem to every other day 2 weeks ago     Outpatient Medications Prior to Visit  Medication Sig Dispense Refill  . aspirin EC 81 MG tablet Take 81 mg by mouth daily.      . cholecalciferol (VITAMIN D) 1000 UNITS tablet Take 1,000 Units by mouth daily.    Marland Kitchen glucosamine-chondroitin ([employer] GLUCOSAMINE-CHONDROITIN) 500-400 MG tablet Take 1 tablet by mouth daily.     Marland Kitchen levothyroxine (SYNTHROID, LEVOTHROID) 50 MCG tablet TAKE 1 TABLET (50 MCG TOTAL) BY MOUTH DAILY. 90 tablet 1  . meloxicam (MOBIC) 15 MG tablet TAKE 1 TABLET(S) EVERY DAY AS NEEDED BY MOUTH WITH FOOD. 30 tablet 5  . Misc Natural Products (OSTEO BI-FLEX JOINT SHIELD PO) Take 1 tablet by mouth daily.    . Multiple Vitamins-Minerals (MULTIVITAMIN WITH MINERALS) tablet Take 1 tablet by mouth daily.      . rosuvastatin (CRESTOR) 10 MG tablet Take 1 tablet (10 mg total) by mouth daily. DISREGARD/ DC  PRIOR RX FOR ATORVASTATIN 90 tablet 3  . diltiazem (CARDIZEM CD) 120 MG 24 hr capsule Take 1 capsule (120 mg total) by mouth daily. 30 capsule 0   No facility-administered medications prior to visit.     Review of Systems;  Patient denies headache, fevers, malaise, unintentional weight loss, skin rash, eye pain, sinus congestion and sinus pain, sore throat, dysphagia,  hemoptysis , cough, dyspnea, wheezing, chest pain, palpitations, orthopnea, edema, abdominal pain, nausea, melena, diarrhea, constipation, flank pain, dysuria, hematuria, urinary  Frequency, nocturia, numbness, tingling, seizures,  Focal weakness, Loss of consciousness,  Tremor, insomnia, depression, anxiety, and suicidal  ideation.      Objective:  BP (!) 142/76   Pulse 71   Temp 97.9 F (36.6 C) (Oral)   Resp 16   Ht 5\' 8"  (1.727 m)   Wt 149 lb 12.8 oz (67.9 kg)   SpO2 99%   BMI 22.78 kg/m   BP Readings from Last 3 Encounters:  04/05/17 118/60  03/14/17 (!) 142/76  03/04/17 118/60    Wt Readings from Last 3 Encounters:  04/05/17 149 lb 8 oz (67.8 kg)  03/14/17 149 lb 12.8 oz (67.9 kg)  03/04/17 151 lb (68.5 kg)    General appearance: alert, cooperative and appears stated age Ears: normal TM's and external ear canals both ears Throat: lips, mucosa, and tongue normal; teeth and gums normal Neck: no adenopathy, no carotid bruit, supple, symmetrical, trachea midline and thyroid not enlarged, symmetric, no tenderness/mass/nodules Back: symmetric, no curvature. ROM normal. No CVA tenderness. Lungs: clear to auscultation bilaterally Heart: regular rate and rhythm, S1, S2 normal, no murmur, click, rub or gallop Abdomen: soft, non-tender; bowel sounds normal; no masses,  no organomegaly Pulses: 2+ and symmetric Skin: Skin color, texture, turgor normal. No rashes or lesions Lymph nodes: Cervical, supraclavicular, and axillary nodes normal.  Lab Results  Component Value Date   HGBA1C 6.0 03/04/2017   HGBA1C 6.0 02/13/2013    Lab Results  Component Value Date   CREATININE 0.81 03/04/2017   CREATININE 0.89 09/03/2016   CREATININE 0.9 09/06/2014    Lab Results  Component Value Date  WBC 5.0 06/10/2015   HGB 12.8 06/10/2015   HCT 38.1 06/10/2015   PLT 205.0 06/10/2015   GLUCOSE 92 03/04/2017   CHOL 186 03/04/2017   TRIG 98.0 03/04/2017   HDL 63.10 03/04/2017   LDLDIRECT 202.9 03/02/2013   LDLCALC 104 (H) 03/04/2017   ALT 29 03/04/2017   AST 29 03/04/2017   NA 140 03/04/2017   K 4.7 03/04/2017   CL 103 03/04/2017   CREATININE 0.81 03/04/2017   BUN 17 03/04/2017   CO2 32 03/04/2017   TSH 1.99 03/04/2017   HGBA1C 6.0 03/04/2017   MICROALBUR 0.1 12/07/2013    Mm Screening  Breast Tomo Bilateral  Result Date: 10/18/2016 CLINICAL DATA:  Screening. EXAM: 2D DIGITAL SCREENING BILATERAL MAMMOGRAM WITH CAD AND ADJUNCT TOMO COMPARISON:  Previous exam(s). ACR Breast Density Category b: There are scattered areas of fibroglandular density. FINDINGS: There are no findings suspicious for malignancy. Images were processed with CAD. IMPRESSION: No mammographic evidence of malignancy. A result letter of this screening mammogram will be mailed directly to the patient. RECOMMENDATION: Screening mammogram in one year. (Code:SM-B-01Y) BI-RADS CATEGORY  1: Negative. Electronically Signed   By: Claudie Revering M.D.   On: 10/18/2016 16:40    Assessment & Plan:   Problem List Items Addressed This Visit    Dysuria    Given recurrence await culture data before treating.       Relevant Orders   POCT Urinalysis Dipstick (Completed)   Urine Microscopic Only (Completed)   Urine Culture (Completed)      I am having Ms. Bahner maintain her aspirin EC, multivitamin with minerals, Misc Natural Products (OSTEO BI-FLEX JOINT SHIELD PO), glucosamine-chondroitin, cholecalciferol, rosuvastatin, meloxicam, and levothyroxine.  Meds ordered this encounter  Medications  . DISCONTD: ciprofloxacin (CIPRO) 250 MG tablet    Sig: Take 1 tablet (250 mg total) by mouth 2 (two) times daily.    Dispense:  10 tablet    Refill:  0    There are no discontinued medications.  Follow-up: No Follow-up on file.   [name], MD

## 2017-03-14 NOTE — Patient Instructions (Signed)
  If your urinary symptoms return before you leave town,  You can  start the cipro .  otherwise let's wait for the culture  Results  If the urine test is negative,  We should conider  A trial of vaginal  estrogen if symptoms are recurrent    Your blood pressure is higher with  less frequent dosing  Of the diltiazem (every other day dosing)   If it continues  To be  > 120/70,  We can either resume  daily diltiazem or add something less fatiguing

## 2017-03-17 LAB — URINE CULTURE

## 2017-03-18 ENCOUNTER — Telehealth: Payer: Self-pay | Admitting: Internal Medicine

## 2017-03-18 NOTE — Telephone Encounter (Signed)
Pt called back returning your call. Thank you!  Call pt @ [phone].

## 2017-03-21 NOTE — Telephone Encounter (Signed)
See result note.  

## 2017-03-24 ENCOUNTER — Other Ambulatory Visit: Payer: Self-pay | Admitting: *Deleted

## 2017-03-24 MED ORDER — DILTIAZEM HCL ER COATED BEADS 120 MG PO CP24: 120.0000 mg | ORAL_CAPSULE | Freq: Every day | ORAL | 1 refills | Status: DC

## 2017-04-05 ENCOUNTER — Ambulatory Visit (INDEPENDENT_AMBULATORY_CARE_PROVIDER_SITE_OTHER): Payer: Medicare Other | Admitting: Cardiovascular Disease

## 2017-04-05 ENCOUNTER — Encounter: Payer: Self-pay | Admitting: Internal Medicine

## 2017-04-05 ENCOUNTER — Encounter: Payer: Self-pay | Admitting: Cardiovascular Disease

## 2017-04-05 VITALS — BP 118/60 | HR 70 | Ht 68.0 in | Wt 149.5 lb

## 2017-04-05 DIAGNOSIS — I493 Ventricular premature depolarization: Secondary | ICD-10-CM | POA: Diagnosis not present

## 2017-04-05 NOTE — Patient Instructions (Signed)
Medication Instructions: Continue same medications.   Labwork: None.   Procedures/Testing: None.   Follow-Up: 1 year with Dr. Arida.   Any Additional Special Instructions Will Be Listed Below (If Applicable).     If you need a refill on your cardiac medications before your next appointment, please call your pharmacy.   

## 2017-04-05 NOTE — Progress Notes (Signed)
Cardiology Office Note   Date:  04/05/2017   ID:  JASARA CORRIGAN, DOB [birth year], MRN [id number]  PCP:  [name], MD  Cardiologist:   Kathlyn Sacramento, MD   Chief Complaint  Patient presents with  . other    6 month follow up. Patient denies chest pain and SOB. Patient would like to discuss Diltiazem. Meds reviewed verbally wiht patient.       History of Present Illness: [name] is a 76 y.o. female who presents for a follow-up visit regarding chest pain and symptomatic PVCs.  She had a treadmill nuclear stress test in December 2016 which showed no evidence of ischemia with low normal ejection fraction. However, she was hypertensive on presentation with hypertensive response to exercise. Thus, I added metoprolol 25 mg twice daily but she did not tolerate this medication due to fatigue. A Holter monitor was done which showed 5300 PVCs in 48 hours representing 3% burden. She was switched to diltiazem extended release 120 mg once daily.She has been doing very well since then with resolution of palpitations and no further episodes of chest pain. She was noted to be mildly bradycardic recently during a routine visits with a heart rate of 51 bpm. Diltiazem was switched to every other day but her blood pressure increased and she felt worse. Thus, she went back on once daily.  Past Medical History:  Diagnosis Date  . Hyperlipidemia   . Thyroid disease     Past Surgical History:  Procedure Laterality Date  . ABDOMINAL HYSTERECTOMY     In 30's  . COLONOSCOPY WITH PROPOFOL N/A 12/29/2015   Procedure: COLONOSCOPY WITH PROPOFOL;  Surgeon: Hulen Luster, MD;  Location: [facility];  Service: Gastroenterology;  Laterality: N/A;  . VAGINAL PROLAPSE REPAIR  2014   Dr. Enzo Bi     Current Outpatient Prescriptions  Medication Sig Dispense Refill  . aspirin EC 81 MG tablet Take 81 mg by mouth daily.      . cholecalciferol (VITAMIN D) 1000 UNITS tablet Take 1,000 Units by  mouth daily.    Marland Kitchen diltiazem (CARDIZEM CD) 120 MG 24 hr capsule Take 1 capsule (120 mg total) by mouth daily. 90 capsule 1  . glucosamine-chondroitin ([employer]S GLUCOSAMINE-CHONDROITIN) 500-400 MG tablet Take 1 tablet by mouth daily.     Marland Kitchen levothyroxine (SYNTHROID, LEVOTHROID) 50 MCG tablet TAKE 1 TABLET (50 MCG TOTAL) BY MOUTH DAILY. 90 tablet 1  . meloxicam (MOBIC) 15 MG tablet TAKE 1 TABLET(S) EVERY DAY AS NEEDED BY MOUTH WITH FOOD. 30 tablet 5  . Misc Natural Products (OSTEO BI-FLEX JOINT SHIELD PO) Take 1 tablet by mouth daily.    . Multiple Vitamins-Minerals (MULTIVITAMIN WITH MINERALS) tablet Take 1 tablet by mouth daily.      . rosuvastatin (CRESTOR) 10 MG tablet Take 1 tablet (10 mg total) by mouth daily. DISREGARD/ DC  PRIOR RX FOR ATORVASTATIN 90 tablet 3   No current facility-administered medications for this visit.     Allergies:   Prednisone    Social History:  The patient  reports that she has quit smoking. Her smoking use included Cigarettes. She quit after 1.00 year of use. She has never used smokeless tobacco. She reports that she does not drink alcohol or use drugs.   Family History:  The patient's family history includes Breast cancer in her mother; Cancer in her brother, brother, brother, sister, and sister; Diabetes in her brother, brother, brother, father, and mother; Heart attack in her brother  and mother; Pancreatic cancer in her sister; Stroke in her father.    ROS:  Please see the history of present illness.   Otherwise, review of systems are positive for none.   All other systems are reviewed and negative.    PHYSICAL EXAM: VS:  BP 118/60 (BP Location: Left Arm, Patient Position: Sitting, Cuff Size: Normal)   Pulse 70   Ht 5\' 8"  (1.727 m)   Wt 149 lb 8 oz (67.8 kg)   BMI 22.73 kg/m  , BMI Body mass index is 22.73 kg/m. GEN: Well nourished, well developed, in no acute distress  HEENT: normal  Neck: no JVD, carotid bruits, or masses Cardiac: RRR; no murmurs, rubs,  or gallops,no edema  Respiratory:  clear to auscultation bilaterally, normal work of breathing GI: soft, nontender, nondistended, + BS MS: no deformity or atrophy  Skin: warm and dry, no rash Neuro:  Strength and sensation are intact Psych: euthymic mood, full affect   EKG:  EKG is ordered today. The ekg ordered today demonstrates  normal sinus rhythm with no significant ST or T wave changes.   Recent Labs: 03/04/2017: ALT 29; BUN 17; Creatinine, Ser 0.81; Potassium 4.7; Sodium 140; TSH 1.99    Lipid Panel    Component Value Date/Time   CHOL 186 03/04/2017 1056   TRIG 98.0 03/04/2017 1056   HDL 63.10 03/04/2017 1056   CHOLHDL 3 03/04/2017 1056   VLDL 19.6 03/04/2017 1056   LDLCALC 104 (H) 03/04/2017 1056   LDLDIRECT 202.9 03/02/2013 1051      Wt Readings from Last 3 Encounters:  04/05/17 149 lb 8 oz (67.8 kg)  03/14/17 149 lb 12.8 oz (67.9 kg)  03/04/17 151 lb (68.5 kg)       No flowsheet data found.    ASSESSMENT AND PLAN:  1.  Symptomatic PVCs: Well-controlled on diltiazem. She had mild bradycardia recently with a heart rate of 51 bpm. However, she was completely asymptomatic with that. Thus, I think it's reasonable to continue same dose of diltiazem extended release 120 mg once daily.  2. Essential hypertension: Blood pressure is controlled.   Disposition:   FU with me in 1 year  Signed,  Kathlyn Sacramento, MD  04/05/2017 2:11 PM    [place]

## 2017-04-14 DIAGNOSIS — H02403 Unspecified ptosis of bilateral eyelids: Secondary | ICD-10-CM | POA: Diagnosis not present

## 2017-04-24 NOTE — Assessment & Plan Note (Signed)
Given recurrence await culture data before treating.

## 2017-04-26 ENCOUNTER — Other Ambulatory Visit: Payer: Self-pay | Admitting: Cardiovascular Disease

## 2017-05-23 ENCOUNTER — Ambulatory Visit
Admission: RE | Admit: 2017-05-23 | Discharge: 2017-05-23 | Disposition: A | Discharge status: Home / Self Care | Payer: Medicare Other | Source: Ambulatory Visit | Attending: Internal Medicine | Admitting: Internal Medicine

## 2017-05-23 DIAGNOSIS — E2839 Other primary ovarian failure: Secondary | ICD-10-CM | POA: Diagnosis not present

## 2017-07-04 ENCOUNTER — Encounter: Payer: Self-pay | Admitting: *Deleted

## 2017-07-11 NOTE — Discharge Instructions (Signed)
INSTRUCTIONS FOLLOWING OCULOPLASTIC SURGERY °AMY M. FOWLER, MD ° °AFTER YOUR EYE SURGERY, THER ARE MANY THINGS THWIHC YOU, THE PATIENT, CAN DO TO ASSURE THE BEST POSSIBLE RESULT FROM YOUR OPERATION.  THIS SHEET SHOULD BE REFERRED TO WHENEVER QUESTIONS ARISE.  IF THERE ARE ANY QUESTIONS NOT ANSWERED HERE, DO NOT HESITATE TO CALL OUR OFFICE AT [phone] OR [phone].  THERE IS ALWAYS [facility] AVAILABLE TO CALL IF QUESTIONS OR PROBLEMS ARISE. ° °VISION: Your vision may be blurred and out of focus after surgery until you are able to stop using your ointment, swelling resolves and your eye(s) heal. This may take 1 to 2 weeks at the least.  If your vision becomes gradually more dim or dark, this is not normal and you need to call our office immediately. ° °EYE CARE: For the first 48 hours after surgery, use ice packs frequently - “20 minutes on, 20 minutes off” - to help reduce swelling and bruising.  Small bags of frozen peas or corn make good ice packs along with cloths soaked in ice water.  If you are wearing a patch or other type of dressing following surgery, keep this on for the amount of time specified by your doctor.  For the first week following surgery, you will need to treat your stitches with great care.  If is OK to shower, but take care to not allow soapy water to run into your eye(s) to help reduce changes of infection.  You may gently clean the eyelashes and around the eye(s) with cotton balls and sterile water, BUT DO NOT RUB THE STITCHES VIGOROUSLY.  Keeping your stitches moist with ointment will help promote healing with minimal scar formation. ° °ACTIVITY: When you leave the surgery center, you should go home, rest and be inactive.  The eye(s) may feel scratchy and keeping the eyes closed will allow for faster healing.  The first week following surgery, avoid straining (anything making the face turn red) or lifting over 20 pounds.  Additionally, avoid bending which causes your head to go below  your waist.  Using your eyes will NOT harm them, so feel free to read, watch television, use the computer, etc as desired.  Driving depends on each individual, so check with your doctor if you have questions about driving. ° °MEDICATIONS:  You will be given a prescription for an ointment to use 4 times a day on your stitches.  You can use the ointment in your eyes if they feel scratchy or irritated.  If you eyelid(s) don’t close completely when you sleep, put some ointment in your eyes before bedtime. ° °EMERGENCY: If you experience SEVERE EYE PAIN OR HEADACHE UNRELIEVED BY TYLENOL OR PERCOCET, NAUSEA OR VOMITING, WORSENING REDNESS, OR WORSENING VISION (ESPECIALLY VISION THAT WA INITIALLY BETTER) CALL [phone] OR [phone] DURING BUSINESS HOURS OR AFTER HOURS. ° °General Anesthesia, Adult, Care After °These instructions provide you with information about caring for yourself after your procedure. Your health care provider may also give you more specific instructions. Your treatment has been planned according to current medical practices, but problems sometimes occur. Call your health care provider if you have any problems or questions after your procedure. °What can I expect after the procedure? °After the procedure, it is common to have: °· Vomiting. °· A sore throat. °· Mental slowness. ° °It is common to feel: °· Nauseous. °· Cold or shivery. °· Sleepy. °· Tired. °· Sore or achy, even in parts of your body where you did not have surgery. ° °  Follow these instructions at home: °For at least 24 hours after the procedure: °· Do not: °? Participate in activities where you could fall or become injured. °? Drive. °? Use heavy machinery. °? Drink alcohol. °? Take sleeping pills or medicines that cause drowsiness. °? Make important decisions or sign legal documents. °? Take care of children on your own. °· Rest. °Eating and drinking °· If you vomit, drink water, juice, or soup when you can drink without  vomiting. °· Drink enough fluid to keep your urine clear or pale yellow. °· Make sure you have little or no nausea before eating solid foods. °· Follow the diet recommended by your health care provider. °General instructions °· Have a responsible adult stay with you until you are awake and alert. °· Return to your normal activities as told by your health care provider. Ask your health care provider what activities are safe for you. °· Take over-the-counter and prescription medicines only as told by your health care provider. °· If you smoke, do not smoke without supervision. °· Keep all follow-up visits as told by your health care provider. This is important. °Contact a health care provider if: °· You continue to have nausea or vomiting at home, and medicines are not helpful. °· You cannot drink fluids or start eating again. °· You cannot urinate after 8-12 hours. °· You develop a skin rash. °· You have fever. °· You have increasing redness at the site of your procedure. °Get help right away if: °· You have difficulty breathing. °· You have chest pain. °· You have unexpected bleeding. °· You feel that you are having a life-threatening or urgent problem. °This information is not intended to replace advice given to you by your health care provider. Make sure you discuss any questions you have with your health care provider. °Document Released: 02/14/2001 Document Revised: 04/12/2016 Document Reviewed: 10/23/2015 °Elsevier Interactive Patient Education © 2018 [employer] ° °

## 2017-07-12 ENCOUNTER — Ambulatory Visit: Payer: Medicare Other | Admitting: Anesthesiology

## 2017-07-12 ENCOUNTER — Encounter
Admission: RE | Disposition: A | Discharge status: Home / Self Care | Payer: Self-pay | Source: Ambulatory Visit | Attending: Ophthalmology

## 2017-07-12 ENCOUNTER — Ambulatory Visit
Admission: RE | Admit: 2017-07-12 | Discharge: 2017-07-12 | Disposition: A | Discharge status: Home / Self Care | Payer: Medicare Other | Source: Ambulatory Visit | Attending: Ophthalmology | Admitting: Ophthalmology

## 2017-07-12 DIAGNOSIS — H02831 Dermatochalasis of right upper eyelid: Secondary | ICD-10-CM | POA: Insufficient documentation

## 2017-07-12 DIAGNOSIS — Z888 Allergy status to other drugs, medicaments and biological substances status: Secondary | ICD-10-CM | POA: Insufficient documentation

## 2017-07-12 DIAGNOSIS — E785 Hyperlipidemia, unspecified: Secondary | ICD-10-CM | POA: Insufficient documentation

## 2017-07-12 DIAGNOSIS — E079 Disorder of thyroid, unspecified: Secondary | ICD-10-CM | POA: Insufficient documentation

## 2017-07-12 DIAGNOSIS — Z90710 Acquired absence of both cervix and uterus: Secondary | ICD-10-CM | POA: Diagnosis not present

## 2017-07-12 DIAGNOSIS — H02403 Unspecified ptosis of bilateral eyelids: Secondary | ICD-10-CM | POA: Insufficient documentation

## 2017-07-12 DIAGNOSIS — H02834 Dermatochalasis of left upper eyelid: Secondary | ICD-10-CM | POA: Diagnosis not present

## 2017-07-12 DIAGNOSIS — I499 Cardiac arrhythmia, unspecified: Secondary | ICD-10-CM | POA: Diagnosis not present

## 2017-07-12 DIAGNOSIS — I10 Essential (primary) hypertension: Secondary | ICD-10-CM | POA: Insufficient documentation

## 2017-07-12 DIAGNOSIS — Z9849 Cataract extraction status, unspecified eye: Secondary | ICD-10-CM | POA: Diagnosis not present

## 2017-07-12 DIAGNOSIS — H02839 Dermatochalasis of unspecified eye, unspecified eyelid: Secondary | ICD-10-CM | POA: Diagnosis present

## 2017-07-12 HISTORY — DX: Unspecified osteoarthritis, unspecified site: M19.90

## 2017-07-12 HISTORY — PX: BROW LIFT: SHX178

## 2017-07-12 HISTORY — PX: PTOSIS REPAIR: SHX6568

## 2017-07-12 SURGERY — BLEPHAROPLASTY
Anesthesia: General | Laterality: Bilateral | Wound class: Clean

## 2017-07-12 MED ORDER — LACTATED RINGERS IV SOLN
INTRAVENOUS | Status: DC | PRN
  Administered 2017-07-12: 09:00:00 via INTRAVENOUS

## 2017-07-12 MED ORDER — TETRACAINE HCL 0.5 % OP SOLN
OPHTHALMIC | Status: DC | PRN
  Administered 2017-07-12: 2 [drp] via OPHTHALMIC

## 2017-07-12 MED ORDER — OXYCODONE-ACETAMINOPHEN 5-325 MG PO TABS: 1.0000 | ORAL_TABLET | ORAL | 0 refills | Status: DC | PRN

## 2017-07-12 MED ORDER — LIDOCAINE-EPINEPHRINE 2 %-1:100000 IJ SOLN
INTRAMUSCULAR | Status: DC | PRN
  Administered 2017-07-12: 2 mL via OPHTHALMIC

## 2017-07-12 MED ORDER — MIDAZOLAM HCL 2 MG/2ML IJ SOLN
INTRAMUSCULAR | Status: DC | PRN
  Administered 2017-07-12: 2 mg via INTRAVENOUS

## 2017-07-12 MED ORDER — ERYTHROMYCIN 5 MG/GM OP OINT
TOPICAL_OINTMENT | OPHTHALMIC | Status: DC | PRN
  Administered 2017-07-12: 1 via OPHTHALMIC

## 2017-07-12 MED ORDER — ALFENTANIL 500 MCG/ML IJ INJ
INJECTION | INTRAVENOUS | Status: DC | PRN
  Administered 2017-07-12: 500 ug via INTRAVENOUS

## 2017-07-12 MED ORDER — PROPOFOL 500 MG/50ML IV EMUL
INTRAVENOUS | Status: DC | PRN
  Administered 2017-07-12: 25 ug/kg/min via INTRAVENOUS

## 2017-07-12 MED ORDER — ERYTHROMYCIN 5 MG/GM OP OINT: TOPICAL_OINTMENT | OPHTHALMIC | 3 refills | Status: DC

## 2017-07-12 SURGICAL SUPPLY — 23 items
APPLICATOR COTTON TIP WD 3 STR (MISCELLANEOUS) ×4 IMPLANT
BLADE SURG 15 STRL LF DISP TIS (BLADE) ×1 IMPLANT
BLADE SURG 15 STRL SS (BLADE) ×1
CORD BIP STRL DISP 12FT (MISCELLANEOUS) ×2 IMPLANT
DRAPE HEAD BAR (DRAPES) ×2 IMPLANT
GAUZE SPONGE 4X4 12PLY STRL (GAUZE/BANDAGES/DRESSINGS) ×2 IMPLANT
GAUZE SPONGE NON-WVN 2X2 STRL (MISCELLANEOUS) ×10 IMPLANT
GLOVE SURG LX 7.0 MICRO (GLOVE) ×2
GLOVE SURG LX STRL 7.0 MICRO (GLOVE) ×2 IMPLANT
MARKER SKIN XFINE TIP W/RULER (MISCELLANEOUS) ×2 IMPLANT
NEEDLE FILTER BLUNT 18X 1/2SAF (NEEDLE) ×1
NEEDLE FILTER BLUNT 18X1 1/2 (NEEDLE) ×1 IMPLANT
NEEDLE HYPO 30X.5 LL (NEEDLE) ×4 IMPLANT
PACK DRAPE NASAL/ENT (PACKS) ×2 IMPLANT
SOL PREP PVP 2OZ (MISCELLANEOUS) ×2
SOLUTION PREP PVP 2OZ (MISCELLANEOUS) ×1 IMPLANT
SPONGE VERSALON 2X2 STRL (MISCELLANEOUS) ×10
SUT PDS AB 4-0 P3 18 (SUTURE) IMPLANT
SUT PLAIN GUT (SUTURE) ×2 IMPLANT
SUT PROLENE 6 0 P 1 18 (SUTURE) ×4 IMPLANT
SYR 3ML LL SCALE MARK (SYRINGE) ×2 IMPLANT
SYRINGE 10CC LL (SYRINGE) ×2 IMPLANT
WATER STERILE IRR 250ML POUR (IV SOLUTION) ×2 IMPLANT

## 2017-07-12 NOTE — Interval H&P Note (Signed)
History and Physical Interval Note:  07/12/2017 9:11 AM  [name]  has presented today for surgery, with the diagnosis of H02.831 H02.834 Dermatochalasis H02.403 ptosis of eyelid unspecified  The various methods of treatment have been discussed with the patient and family. After consideration of risks, benefits and other options for treatment, the patient has consented to  Procedure(s) with comments: BLEPHAROPLASTY upper eyelid with excess skin (Bilateral) - MAC ptosis repair resect ex (Bilateral) - Requests early as a surgical intervention .  The patient's history has been reviewed, patient examined, no change in status, stable for surgery.  I have reviewed the patient's chart and labs.  Questions were answered to the patient's satisfaction.     Vickki Muff, Amy M

## 2017-07-12 NOTE — Op Note (Signed)
Preoperative Diagnosis:  1. Visually significant blepharoptosis both Upper Eyelid(s) 2. Visually significant dermatochalasis both Upper Eyelid(s)  Postoperative Diagnosis:  Same.  Procedure(s) Performed:   1. Blepharoptosis repair with levator aponeurosis advancement bilateral Upper Eyelid(s) 2. Upper eyelid blepharoplasty with excess skin excision  bilateral Upper Eyelid(s)  Surgeon: Philis Pique. Vickki Muff, M.D.  Assistants: none  Anesthesia: MAC  Specimens: None.  Estimated Blood Loss: Minimal.  Complications: None.  Operative Findings: None Dictated  Procedure:   Allergies were reviewed and the patient Prednisone.   After the risks, benefits, complications and alternatives were discussed with the patient, appropriate informed consent was obtained.  While seated in an upright position and looking in primary gaze, the mid pupillary line was marked on the upper eyelid margins bilaterally. The patient was then brought to the operating suite and reclined supine.  Timeout was conducted and the patient was sedated.  Local anesthetic consisting of a 50-50 mixture of 2% lidocaine with epinephrine and 0.75% bupivacaine with added Hylenex was injected subcutaneously to both upper eyelid(s). After adequate local was instilled, the patient was prepped and draped in the usual sterile fashion for eyelid surgery.   Attention was turned to the upper eyelids. A 85m upper eyelid crease incision line was marked with calipers on both upper eyelid(s).  A pinch test was used to estimate the amount of excess skin to remove and this was marked in standard blepharoplasty style fashion. Attention was turned to the  right upper eyelid. A #15 blade was used to open the premarked incision line. A skin only flap was excised and hemostasis was obtained with bipolar cautery.   A buttonhole was created medially in orbicularis and orbital septum to reveal the medial fat pocket. This was dissected free from fascial  attachments, cauterized towards the pedicle base and excised to produce a nice flattening of the medial corner of the upper eyelid.  Westcott scissors were then used to transect through orbicularis down to the tarsal plate. Epitarsus was dissected to create a smooth surface to suture to. Dissection was then carried superiorly in the plane between orbicularis and orbital septum. Once the preaponeurotic fat pocket was identified, the orbital septum was opened. This revealed the levator and its aponeurosis.    Attention was then turned to the opposite eyelid where the same procedure was performed in the same manner. Hemostasis was obtained with bipolar cautery throughout.   3 interrupted 6-0 Prolene sutures were then passed partial thickness through the tarsal plates of both upper eyelid(s). These sutures were placed in line with the mid pupillary, medial limbal, and lateral limbal lines. The sutures were fixed to the levator aponeurosis and adjusted until a nice lid height and contour were achieved. Once nice symmetry was achieved, the skin incisions were closed with a running 6-0 fast absorbing plain suture. The patient tolerated the procedure well.  Erythromycin ophthalmic ointment was applied to the incision site(s) followed by ice packs. The patient was taken to the recovery area where she recovered without difficulty.  Post-Op Plan/Instructions:  The patient was instructed to use ice packs frequently for the next 48 hours. She was instructed to use erythromycin ophthalmic ointment on her incisions 4 times a day for the next 12 to 14 days. Shewas given a prescription for Percocet for pain control should Tylenol not be effective. [facility] [place] [facility] [place] or sooner as needed for problems.  Pricila Bridge M. FVickki Muff M.D.  Attending,Ophthalmology

## 2017-07-12 NOTE — Anesthesia Procedure Notes (Signed)
Procedure Name: MAC Date/Time: 07/12/2017 9:25 AM Performed by: Cameron Ali Pre-anesthesia Checklist: Patient identified, Emergency Drugs available, Suction available, Timeout performed and Patient being monitored Patient Re-evaluated:Patient Re-evaluated prior to induction Oxygen Delivery Method: Nasal cannula Placement Confirmation: positive ETCO2

## 2017-07-12 NOTE — H&P (Signed)
See the history and physical completed at [facility] on 06/28/17 and scanned into the chart.

## 2017-07-12 NOTE — Transfer of Care (Signed)
Immediate Anesthesia Transfer of Care Note  Patient: [name]  Procedure(s) Performed: Procedure(s) with comments: BLEPHAROPLASTY upper eyelid with excess skin (Bilateral) - MAC ptosis repair resect ex (Bilateral) - Requests early  Patient Location: PACU  Anesthesia Type: General  Level of Consciousness: awake, alert  and patient cooperative  Airway and Oxygen Therapy: Patient Spontanous Breathing and Patient connected to supplemental oxygen  Post-op Assessment: Post-op Vital signs reviewed, Patient's Cardiovascular Status Stable, Respiratory Function Stable, Patent Airway and No signs of Nausea or vomiting  Post-op Vital Signs: Reviewed and stable  Complications: No apparent anesthesia complications

## 2017-07-12 NOTE — Anesthesia Postprocedure Evaluation (Signed)
Anesthesia Post Note  Patient: [name]  Procedure(s) Performed: Procedure(s) (LRB): BLEPHAROPLASTY upper eyelid with excess skin (Bilateral) ptosis repair resect ex (Bilateral)  Patient location during evaluation: PACU Anesthesia Type: General Level of consciousness: awake and alert Pain management: pain level controlled Vital Signs Assessment: post-procedure vital signs reviewed and stable Respiratory status: spontaneous breathing, nonlabored ventilation, respiratory function stable and patient connected to nasal cannula oxygen Cardiovascular status: blood pressure returned to baseline and stable Postop Assessment: no signs of nausea or vomiting Anesthetic complications: no    Kenson Groh ELAINE

## 2017-07-12 NOTE — Anesthesia Preprocedure Evaluation (Signed)
Anesthesia Evaluation  Patient identified by MRN, date of birth, ID band Patient awake    Reviewed: Allergy & Precautions, H&P , NPO status , Patient's Chart, lab work & pertinent test results, reviewed documented beta blocker date and time   History of Anesthesia Complications Negative for: history of anesthetic complications  Airway Mallampati: I  TM Distance: >3 FB Neck ROM: full    Dental no notable dental hx. (+) Caps, Teeth Intact   Pulmonary neg pulmonary ROS, former smoker,    Pulmonary exam normal breath sounds clear to auscultation       Cardiovascular Exercise Tolerance: Good (-) hypertension(-) angina+ Peripheral Vascular Disease  (-) CAD, (-) Past MI, (-) Cardiac Stents and (-) CABG Normal cardiovascular exam+ dysrhythmias (palpitations) (-) Valvular Problems/Murmurs Rhythm:regular Rate:Normal     Neuro/Psych negative neurological ROS  negative psych ROS   GI/Hepatic negative GI ROS, Neg liver ROS,   Endo/Other  neg diabetesHypothyroidism   Renal/GU negative Renal ROS  negative genitourinary   Musculoskeletal   Abdominal   Peds  Hematology negative hematology ROS (+)   Anesthesia Other Findings Past Medical History:   Hyperlipidemia                                               Thyroid disease                                              Reproductive/Obstetrics negative OB ROS                             Anesthesia Physical  Anesthesia Plan  ASA: II  Anesthesia Plan: General   Post-op Pain Management:    Induction:   PONV Risk Score and Plan:   Airway Management Planned:   Additional Equipment:   Intra-op Plan:   Post-operative Plan:   Informed Consent: I have reviewed the patients History and Physical, chart, labs and discussed the procedure including the risks, benefits and alternatives for the proposed anesthesia with the patient or authorized  representative who has indicated his/her understanding and acceptance.   Dental Advisory Given  Plan Discussed with: CRNA  Anesthesia Plan Comments:         Anesthesia Quick Evaluation

## 2017-07-13 ENCOUNTER — Encounter: Payer: Self-pay | Admitting: Ophthalmology

## 2017-07-28 ENCOUNTER — Other Ambulatory Visit: Payer: Self-pay | Admitting: Cardiovascular Disease

## 2017-07-28 ENCOUNTER — Ambulatory Visit: Payer: Medicare Other

## 2017-07-29 ENCOUNTER — Ambulatory Visit (INDEPENDENT_AMBULATORY_CARE_PROVIDER_SITE_OTHER): Payer: Medicare Other

## 2017-07-29 VITALS — BP 110/68 | HR 64 | Temp 98.4°F | Resp 14 | Ht 67.0 in | Wt 146.0 lb

## 2017-07-29 DIAGNOSIS — Z0000 Encounter for general adult medical examination without abnormal findings: Secondary | ICD-10-CM

## 2017-07-29 DIAGNOSIS — Z1389 Encounter for screening for other disorder: Secondary | ICD-10-CM | POA: Diagnosis not present

## 2017-07-29 DIAGNOSIS — Z23 Encounter for immunization: Secondary | ICD-10-CM

## 2017-07-29 DIAGNOSIS — Z1331 Encounter for screening for depression: Secondary | ICD-10-CM

## 2017-07-29 NOTE — Progress Notes (Signed)
Subjective:   [name] is a 76 y.o. female who presents for Medicare Annual (Subsequent) preventive examination.  Review of Systems:  No ROS.  Medicare Wellness Visit. Additional risk factors are reflected in the social history. Cardiac Risk Factors include: advanced age (>72mn, >>34women)     Objective:     Vitals: BP 110/68 (BP Location: Left Arm, Patient Position: Sitting, Cuff Size: Normal)   Pulse 64   Temp 98.4 F (36.9 C) (Oral)   Resp 14   Ht _0  (1.702 m)   Wt 146 lb (66.2 kg)   SpO2 96%   BMI 22.87 kg/m   Body mass index is 22.87 kg/m.   Tobacco History  Smoking Status  . Former Smoker  . Years: 1.00  . Types: Cigarettes  Smokeless Tobacco  . Never Used    Comment: Smoked x 1 year in her 20's     Counseling given: Not Answered   Past Medical History:  Diagnosis Date  . Arthritis    knees  . Hyperlipidemia   . Palpitations    controlled by diltiazem  . Thyroid disease    Past Surgical History:  Procedure Laterality Date  . ABDOMINAL HYSTERECTOMY     In 30's  . BROW LIFT Bilateral 07/12/2017   Procedure: BLEPHAROPLASTY upper eyelid with excess skin;  Surgeon: FKarle Starch MD;  [facility]  Service: Ophthalmology;  Laterality: Bilateral;  MAC  . COLONOSCOPY WITH PROPOFOL N/A 12/29/2015   Procedure: COLONOSCOPY WITH PROPOFOL;  Surgeon: PHulen Luster MD;  [facility]  Service: Gastroenterology;  Laterality: N/A;  . PTOSIS REPAIR Bilateral 07/12/2017   Procedure: ptosis repair resect ex;  Surgeon: FKarle Starch MD;  [facility]  Service: Ophthalmology;  Laterality: Bilateral;  Requests early  . VAGINAL PROLAPSE REPAIR  2014   Dr. DEnzo Bi  Family History  Problem Relation Age of Onset  . Breast cancer Mother        634's . Heart attack Mother   . Diabetes Mother   . Stroke Father   . Diabetes Father   . Pancreatic cancer Sister   . Cancer Sister        pancreatic  . Cancer  Brother        liver  . Diabetes Brother   . Cancer Sister        anal  . Heart attack Brother   . Diabetes Brother   . Cancer Brother        stomach  . Cancer Brother   . Diabetes Brother   . Colon cancer Neg Hx    History  Sexual Activity  . Sexual activity: No    Outpatient Encounter Prescriptions as of 07/29/2017  Medication Sig  . aspirin EC 81 MG tablet Take 81 mg by mouth daily.    . cholecalciferol (VITAMIN D) 1000 UNITS tablet Take 2,000 Units by mouth daily.   . Cranberry 425 MG CAPS Take by mouth daily.  .Marland Kitchendiltiazem (CARDIZEM CD) 120 MG 24 hr capsule TAKE 1 CAPSULE (120 MG TOTAL) BY MOUTH DAILY.  . diphenhydramine-acetaminophen (TYLENOL PM) 25-500 MG TABS tablet Take 1 tablet by mouth at bedtime as needed.  .Marland Kitchenglucosamine-chondroitin ([employer]S GLUCOSAMINE-CHONDROITIN) 500-400 MG tablet Take 1 tablet by mouth daily.   .Marland Kitchenlevothyroxine (SYNTHROID, LEVOTHROID) 50 MCG tablet TAKE 1 TABLET (50 MCG TOTAL) BY MOUTH DAILY.  . Multiple Vitamins-Minerals (MULTIVITAMIN WITH MINERALS) tablet Take 1 tablet by mouth daily.    .Marland Kitchen  Omega-3 Fatty Acids (FISH OIL) 1000 MG CPDR Take by mouth daily.  . rosuvastatin (CRESTOR) 10 MG tablet Take 1 tablet (10 mg total) by mouth daily. DISREGARD/ DC  PRIOR RX FOR ATORVASTATIN  . [DISCONTINUED] diltiazem (CARDIZEM CD) 120 MG 24 hr capsule Take 1 capsule (120 mg total) by mouth daily.  . [DISCONTINUED] erythromycin [facility]) ophthalmic ointment Use a small amount on your sutures 4 times a day for the next 2 weeks. Switch to Aquaphor ointment should allergy develop.  . [DISCONTINUED] oxyCODONE-acetaminophen (PERCOCET) 5-325 MG tablet Take 1 tablet by mouth every 4 (four) hours as needed for severe pain.   No facility-administered encounter medications on file as of 07/29/2017.     Activities of Daily Living In your present state of health, do you have any difficulty performing the following activities: 07/29/2017 07/12/2017  Hearing? N N  Vision? N N    Difficulty concentrating or making decisions? N N  Walking or climbing stairs? N N  Dressing or bathing? N N  Doing errands, shopping? N -  Preparing Food and eating ? N -  Using the Toilet? N -  In the past six months, have you accidently leaked urine? N -  Do you have problems with loss of bowel control? N -  Managing your Medications? N -  Managing your Finances? N -  Housekeeping or managing your Housekeeping? N -  Some recent data might be hidden    Patient Care Team: [name], MD as PCP - General (Internal Medicine)    Assessment:    This is a routine wellness examination for [name]. The goal of the wellness visit is to assist the patient how to close the gaps in care and create a preventative care plan for the patient.   The roster of all physicians providing medical care to patient is listed in the Snapshot section of the chart.  Taking calcium VIT D as appropriate/Osteoporosis risk reviewed.    Safety issues reviewed; Primary caretaker for husband.  Smoke and carbon monoxide detectors in the home. No firearms in the home.  Wears seatbelts when driving or riding with others. Patient does wear sunscreen or protective clothing when in direct sunlight. No violence in the home.  Depression- PHQ 2 &9 complete.  No signs/symptoms or verbal communication regarding little pleasure in doing things, feeling down, depressed or hopeless. No changes in sleeping, energy, eating, concentrating.  No thoughts of self harm or harm towards others.  Time spent on this topic is 8 minutes.   Patient is alert, normal appearance, oriented to person/place/and time.  Correctly identified the president of the [place], recall of 3/3 words, and performing simple calculations. Displays appropriate judgement and can read correct time from watch face.   No new identified risk were noted.  No failures at ADL's or IADL's.    BMI- discussed the importance of a healthy diet, water intake and the  benefits of aerobic exercise. Educational material provided.   24 hour diet recall: Breakfast: cereal Lunch: salad, fruit Dinner: vegetable soup, half chicken sandwich  Daily fluid intake: 1 cups of caffeine,  4 cups of water  Dental- every 6 months.  Dr. Donetta Potts.  Eye- Visual acuity not assessed per patient preference since they have regular follow up with the ophthalmologist.  Wears corrective lenses.  High dose influenza vaccine administered R deltoid, tolerated well. Educational material provided.  Health maintenance gaps- closed.  Patient Concerns: None at this time. Follow up with PCP as needed. Exercise  Activities and Dietary recommendations Current Exercise Habits: Structured exercise class, Type of exercise: strength training/weights;treadmill;calisthenics;walking, Time (Minutes): 50, Frequency (Times/Week): 4, Weekly Exercise (Minutes/Week): 200, Intensity: Moderate  Goals    . Increase water intake          Increase water intake up to 2 bottles (4 cups ) daily. Stay hydrated!         Fall Risk Fall Risk  07/29/2017 07/27/2016 07/25/2015 12/07/2013 11/20/2012  Falls in the past year? _0    Depression Screen PHQ 2/9 Scores 07/29/2017 07/27/2016 07/25/2015 12/07/2013  PHQ - 2 Score 0 0 0 0  PHQ- 9 Score 0 - - -     Cognitive Function MMSE - Mini Mental State Exam 07/29/2017 07/27/2016 07/25/2015  Orientation to time _1 Orientation to Place _2 Registration _3 Attention/ Calculation _4 Recall _5 Language- name 2 objects _6 Language- repeat _7 Language- follow 3 step command _8 Language- read & follow direction _9 Write a sentence _10 Copy design _11 Total score _12 Immunization History  Administered Date(s) Administered  . Influenza Split 08/18/2011, 08/04/2012  . Influenza Whole 09/10/2013  . Influenza, High Dose Seasonal PF 07/25/2015, 07/27/2016, 07/29/2017  . Influenza,inj,Quad PF,6+ Mos 09/06/2014    . Pneumococcal Conjugate-13 12/07/2013  . Pneumococcal Polysaccharide-23 12/18/2007  . Tdap 11/20/2012, 09/29/2016  . Zoster 12/18/2007   Screening Tests Health Maintenance  Topic Date Due  . TETANUS/TDAP  09/29/2026  . INFLUENZA VACCINE  Completed  . DEXA SCAN  Completed  . PNA vac Low Risk Adult  Completed      Plan:   End of life planning; Advanced aging; Advanced directives discussed.  No HCPOA/Living Will.  Additional information declined at this time.  I have personally reviewed and noted the following in the patient's chart:   . Medical and social history . Use of alcohol, tobacco or illicit drugs  . Current medications and supplements . Functional ability and status . Nutritional status . Physical activity . Advanced directives . List of other physicians . Hospitalizations, surgeries, and ER visits in previous 12 months . Vitals . Screenings to include cognitive, depression, and falls . Referrals and appointments  In addition, I have reviewed and discussed with patient certain preventive protocols, quality metrics, and best practice recommendations. A written personalized care plan for preventive services as well as general preventive health recommendations were provided to patient.     OBrien-Blaney, Jacqueline Delapena L, LPN  7/0/7867    I have reviewed the above information and agree with above.   Deborra Medina, MD

## 2017-07-29 NOTE — Patient Instructions (Addendum)
  Ms. Broaden , Thank you for taking time to come for your Medicare Wellness Visit. I appreciate your ongoing commitment to your health goals. Please review the following plan we discussed and let me know if I can assist you in the future.   Follow up with Dr. Derrel Nip as needed.    Bring a copy of your [place] and/or Living Will to be scanned into chart once completed.  Have a great day!  These are the goals we discussed: Goals    . Increase water intake          Increase water intake up to 2 bottles (4 cups ) daily. Stay hydrated!          This is a list of the screening recommended for you and due dates:  Health Maintenance  Topic Date Due  . Tetanus Vaccine  09/29/2026  . Flu Shot  Completed  . DEXA scan (bone density measurement)  Completed  . Pneumonia vaccines  Completed

## 2017-08-23 ENCOUNTER — Telehealth: Payer: Self-pay

## 2017-08-23 DIAGNOSIS — E039 Hypothyroidism, unspecified: Secondary | ICD-10-CM

## 2017-08-23 DIAGNOSIS — R5383 Other fatigue: Secondary | ICD-10-CM

## 2017-08-23 DIAGNOSIS — E785 Hyperlipidemia, unspecified: Secondary | ICD-10-CM

## 2017-08-23 DIAGNOSIS — R7301 Impaired fasting glucose: Secondary | ICD-10-CM

## 2017-08-23 NOTE — Telephone Encounter (Signed)
-----   Message from Dia Crawford, LPN sent at 0/60/0459  4:48 PM EDT ----- Regarding: Future labs needed Hi Agapito Hanway, I saw this patient and noticed she has a lab and follow up scheduled with Dr. Derrel Nip in Oct but no future orders.  I am unsure if there is something particular needed so I thought I'd give you a heads up so she doesn't show up without orders.   Thanks  Denisa

## 2017-08-23 NOTE — Telephone Encounter (Signed)
Labs have been ordered for lab appt on 09/02/2017.

## 2017-08-28 ENCOUNTER — Other Ambulatory Visit: Payer: Self-pay | Admitting: Internal Medicine

## 2017-08-31 ENCOUNTER — Other Ambulatory Visit: Payer: Self-pay | Admitting: Internal Medicine

## 2017-09-02 ENCOUNTER — Other Ambulatory Visit (INDEPENDENT_AMBULATORY_CARE_PROVIDER_SITE_OTHER): Payer: Medicare Other

## 2017-09-02 DIAGNOSIS — E039 Hypothyroidism, unspecified: Secondary | ICD-10-CM | POA: Diagnosis not present

## 2017-09-02 DIAGNOSIS — R5383 Other fatigue: Secondary | ICD-10-CM | POA: Diagnosis not present

## 2017-09-02 DIAGNOSIS — R7301 Impaired fasting glucose: Secondary | ICD-10-CM | POA: Diagnosis not present

## 2017-09-02 DIAGNOSIS — E785 Hyperlipidemia, unspecified: Secondary | ICD-10-CM | POA: Diagnosis not present

## 2017-09-02 LAB — LDL CHOLESTEROL, DIRECT: Direct LDL: 93 mg/dL

## 2017-09-02 LAB — CBC WITH DIFFERENTIAL/PLATELET
Basophils Absolute: 0.1 10*3/uL (ref 0.0–0.1)
Basophils Relative: 1.2 % (ref 0.0–3.0)
Eosinophils Absolute: 0.3 10*3/uL (ref 0.0–0.7)
Eosinophils Relative: 6.3 % — ABNORMAL HIGH (ref 0.0–5.0)
HCT: 40.6 % (ref 36.0–46.0)
Hemoglobin: 13.4 g/dL (ref 12.0–15.0)
Lymphocytes Relative: 28.4 % (ref 12.0–46.0)
Lymphs Abs: 1.3 10*3/uL (ref 0.7–4.0)
MCHC: 32.9 g/dL (ref 30.0–36.0)
MCV: 96.2 fl (ref 78.0–100.0)
Monocytes Absolute: 0.4 10*3/uL (ref 0.1–1.0)
Monocytes Relative: 8.2 % (ref 3.0–12.0)
Neutro Abs: 2.6 10*3/uL (ref 1.4–7.7)
Neutrophils Relative %: 55.9 % (ref 43.0–77.0)
Platelets: 261 10*3/uL (ref 150.0–400.0)
RBC: 4.22 Mil/uL (ref 3.87–5.11)
RDW: 12.8 % (ref 11.5–15.5)
WBC: 4.6 10*3/uL (ref 4.0–10.5)

## 2017-09-02 LAB — LIPID PANEL
Cholesterol: 183 mg/dL (ref 0–200)
HDL: 50.9 mg/dL (ref >39.00)
LDL Cholesterol: 94 mg/dL (ref 0–99)
NonHDL: 132.17
Total CHOL/HDL Ratio: 4
Triglycerides: 189 mg/dL — ABNORMAL HIGH (ref 0.0–149.0)
VLDL: 37.8 mg/dL (ref 0.0–40.0)

## 2017-09-02 LAB — COMPREHENSIVE METABOLIC PANEL
ALT: 19 U/L (ref 0–35)
AST: 22 U/L (ref 0–37)
Albumin: 4.3 g/dL (ref 3.5–5.2)
Alkaline Phosphatase: 40 U/L (ref 39–117)
BUN: 12 mg/dL (ref 6–23)
CO2: 29 mEq/L (ref 19–32)
Calcium: 9.3 mg/dL (ref 8.4–10.5)
Chloride: 105 mEq/L (ref 96–112)
Creatinine, Ser: 0.82 mg/dL (ref 0.40–1.20)
GFR: 71.99 mL/min (ref >60.00)
Glucose, Bld: 93 mg/dL (ref 70–99)
Potassium: 4.6 mEq/L (ref 3.5–5.1)
Sodium: 143 mEq/L (ref 135–145)
Total Bilirubin: 0.4 mg/dL (ref 0.2–1.2)
Total Protein: 7.4 g/dL (ref 6.0–8.3)

## 2017-09-02 LAB — TSH: TSH: 2.79 u[IU]/mL (ref 0.35–4.50)

## 2017-09-02 LAB — HEMOGLOBIN A1C: Hgb A1c MFr Bld: 5.8 % (ref 4.6–6.5)

## 2017-09-05 ENCOUNTER — Encounter: Payer: Self-pay | Admitting: Internal Medicine

## 2017-09-05 ENCOUNTER — Ambulatory Visit (INDEPENDENT_AMBULATORY_CARE_PROVIDER_SITE_OTHER): Payer: Medicare Other | Admitting: Internal Medicine

## 2017-09-05 DIAGNOSIS — I700 Atherosclerosis of aorta: Secondary | ICD-10-CM | POA: Diagnosis not present

## 2017-09-05 DIAGNOSIS — E039 Hypothyroidism, unspecified: Secondary | ICD-10-CM

## 2017-09-05 DIAGNOSIS — R0989 Other specified symptoms and signs involving the circulatory and respiratory systems: Secondary | ICD-10-CM | POA: Diagnosis not present

## 2017-09-05 DIAGNOSIS — R7303 Prediabetes: Secondary | ICD-10-CM | POA: Diagnosis not present

## 2017-09-05 DIAGNOSIS — E7849 Other hyperlipidemia: Secondary | ICD-10-CM

## 2017-09-05 DIAGNOSIS — I7300 Raynaud's syndrome without gangrene: Secondary | ICD-10-CM | POA: Diagnosis not present

## 2017-09-05 NOTE — Assessment & Plan Note (Signed)
Thyroid function is WNL on current dose.  No current changes needed.   Lab Results  Component Value Date   TSH 2.79 09/02/2017

## 2017-09-05 NOTE — Assessment & Plan Note (Signed)
No evidence of hemodynamically significant stenosis on dopplers done by AV & V in 2015

## 2017-09-05 NOTE — Progress Notes (Signed)
Subjective:  Patient ID: [name], female    DOB: 1941-01-30  Age: 76 y.o. MRN: [id number]  CC: Diagnoses of Prediabetes, Raynaud's disease without gangrene, Acquired hypothyroidism, Familial hyperlipidemia, Left carotid bruit, and Abdominal aortic atherosclerosis ([place]) were pertinent to this visit.  HPI [name] presents for FOLLOW  On chronic issues   Taking diltiazem daily since August.  Notes that her blood pressure has been lower. She is tolerating the medication without side effects.  .  Had an episode of index finger turning blue for several hours after exposure to cold Felt stiff and numb, but denied pain,  Resolved with increased temperature after several hours  Has happened to other fingers on both hands in the past year and a half,  Usually only one finger at a time.  Denies dysphagia, cough,  Tight skin .  No history of blood clots. r     Outpatient Medications Prior to Visit  Medication Sig Dispense Refill  . aspirin EC 81 MG tablet Take 81 mg by mouth daily.      . cholecalciferol (VITAMIN D) 1000 UNITS tablet Take 2,000 Units by mouth daily.     . Cranberry 425 MG CAPS Take by mouth daily.    Marland Kitchen diltiazem (CARDIZEM CD) 120 MG 24 hr capsule TAKE 1 CAPSULE (120 MG TOTAL) BY MOUTH DAILY. 30 capsule 3  . diphenhydramine-acetaminophen (TYLENOL PM) 25-500 MG TABS tablet Take 1 tablet by mouth at bedtime as needed.    Marland Kitchen glucosamine-chondroitin ([employer]S GLUCOSAMINE-CHONDROITIN) 500-400 MG tablet Take 1 tablet by mouth daily.     Marland Kitchen levothyroxine (SYNTHROID, LEVOTHROID) 50 MCG tablet TAKE 1 TABLET (50 MCG TOTAL) BY MOUTH DAILY. 90 tablet 1  . Multiple Vitamins-Minerals (MULTIVITAMIN WITH MINERALS) tablet Take 1 tablet by mouth daily.      . Omega-3 Fatty Acids (FISH OIL) 1000 MG CPDR Take by mouth daily.    . rosuvastatin (CRESTOR) 10 MG tablet TAKE 1 TABLET BY MOUTH EVERY DAY 90 tablet 3   No facility-administered medications prior to visit.     Review of  Systems;  Patient denies headache, fevers, malaise, unintentional weight loss, skin rash, eye pain, sinus congestion and sinus pain, sore throat, dysphagia,  hemoptysis , cough, dyspnea, wheezing, chest pain, palpitations, orthopnea, edema, abdominal pain, nausea, melena, diarrhea, constipation, flank pain, dysuria, hematuria, urinary  Frequency, nocturia, numbness, tingling, seizures,  Focal weakness, Loss of consciousness,  Tremor, insomnia, depression, anxiety, and suicidal ideation.      Objective:  BP 124/72 (BP Location: Left Arm, Patient Position: Sitting, Cuff Size: Normal)   Pulse 67   Temp 98.2 F (36.8 C) (Oral)   Resp 14   Ht 5\' 7"  (1.702 m)   Wt 146 lb 9.6 oz (66.5 kg)   SpO2 98%   BMI 22.96 kg/m   BP Readings from Last 3 Encounters:  09/05/17 124/72  07/29/17 110/68  07/12/17 (!) 152/59    Wt Readings from Last 3 Encounters:  09/05/17 146 lb 9.6 oz (66.5 kg)  07/29/17 146 lb (66.2 kg)  07/12/17 144 lb (65.3 kg)    General appearance: alert, cooperative and appears stated age Ears: normal TM's and external ear canals both ears Throat: lips, mucosa, and tongue normal; teeth and gums normal Neck: no adenopathy, no carotid bruit, supple, symmetrical, trachea midline and thyroid not enlarged, symmetric, no tenderness/mass/nodules Back: symmetric, no curvature. ROM normal. No CVA tenderness. Lungs: clear to auscultation bilaterally Heart: regular rate and rhythm, S1, S2 normal, no  murmur, click, rub or gallop Abdomen: soft, non-tender; bowel sounds normal; no masses,  no organomegaly Pulses: 2+ and symmetric Skin: Skin color, texture, turgor normal. No rashes or lesions Lymph nodes: Cervical, supraclavicular, and axillary nodes normal.  Lab Results  Component Value Date   HGBA1C 5.8 09/02/2017   HGBA1C 6.0 03/04/2017   HGBA1C 6.0 02/13/2013    Lab Results  Component Value Date   CREATININE 0.82 09/02/2017   CREATININE 0.81 03/04/2017   CREATININE 0.89  09/03/2016    Lab Results  Component Value Date   WBC 4.6 09/02/2017   HGB 13.4 09/02/2017   HCT 40.6 09/02/2017   PLT 261.0 09/02/2017   GLUCOSE 93 09/02/2017   CHOL 183 09/02/2017   TRIG 189.0 (H) 09/02/2017   HDL 50.90 09/02/2017   LDLDIRECT 93.0 09/02/2017   LDLCALC 94 09/02/2017   ALT 19 09/02/2017   AST 22 09/02/2017   NA 143 09/02/2017   K 4.6 09/02/2017   CL 105 09/02/2017   CREATININE 0.82 09/02/2017   BUN 12 09/02/2017   CO2 29 09/02/2017   TSH 2.79 09/02/2017   HGBA1C 5.8 09/02/2017   MICROALBUR 0.1 12/07/2013    Dg Bone Density  Result Date: 05/24/2017 EXAM: DUAL X-RAY ABSORPTIOMETRY (DXA) FOR BONE MINERAL DENSITY IMPRESSION: Dear Dr. [name], Your patient [name] completed a BMD test on 05/23/2017 using the [place] (analysis version: 14.10) manufactured by [employer]. The following summarizes the results of our evaluation. PATIENT BIOGRAPHICAL: Name: [name], [name] Patient ID: [id number] Birth Date: 08-01-1941 Height: 66.0 in. Gender: Female Exam Date: 05/23/2017 Weight: 145.4 lbs. Indications: Advanced Age, Caucasian, Family History of Fracture, Hypothyroid, Hysterectomy, Postmenopausal, Vitamin D Deficiency, Family Hist. (Parent hip fracture) Fractures: Treatments: ASPRIN 81 MG, CALCIUM VIT D, crestor, LEVOTHYROXINE, mobic, Multi-Vitamin with calcium ASSESSMENT: The BMD measured at Forearm Radius 33% is 0.819 g/cm2 with a T-score of -0.6. This patient is considered NORMAL according to [facility]) criteria. Lumbar spine was not utilized due to advanced degenerative changes. Site Region Measured Measured WHO Young Adult BMD Date       Age      Classification T-score DualFemur Neck Left 05/23/2017 76.0 Normal -0.2 1.016 g/cm2 DualFemur Neck Left 10/28/2010 69.4 Normal 0.0 1.043 g/cm2 Right Forearm Radius 33% 05/23/2017 76.0 Normal -0.6 0.819 g/cm2 Right Forearm Radius 33% 10/28/2010 69.4 Normal -0.3 0.846 g/cm2 World  Health Organization [facility]) criteria for post-menopausal, Caucasian Women: Normal:       T-score at or above -1 SD Osteopenia:   T-score between -1 and -2.5 SD Osteoporosis: T-score at or below -2.5 SD RECOMMENDATIONS: [place] recommends that FDA-approved medical therapies be considered in postmenopausal women and men age 16 or older with a: 1. Hip or vertebral (clinical or morphometric) fracture. 2. T-score of < -2.5 at the spine or hip. 3. Ten-year fracture probability by FRAX of 3% or greater for hip fracture or 20% or greater for major osteoporotic fracture. All treatment decisions require clinical judgment and consideration of individual patient factors, including patient preferences, co-morbidities, previous drug use, risk factors not captured in the FRAX model (e.g. falls, vitamin D deficiency, increased bone turnover, interval significant decline in bone density) and possible under - or over-estimation of fracture risk by FRAX. All patients should ensure an adequate intake of dietary calcium (1200 mg/d) and vitamin D (800 IU daily) unless contraindicated. FOLLOW-UP: People with diagnosed cases of osteoporosis or at high risk for fracture should have regular bone  mineral density tests. For patients eligible for Medicare, routine testing is allowed once every 2 years. The testing frequency can be increased to one year for patients who have rapidly progressing disease, those who are receiving or discontinuing medical therapy to restore bone mass, or have additional risk factors. I have reviewed this report, and agree with the above findings. Mark A. Thornton Papas, M.D. [facility] Dear Dr. [name], Your patient [name] completed a FRAX assessment on 05/23/2017 using the [place] (analysis version: 14.10) manufactured by [employer]. The following summarizes the results of our evaluation. PATIENT BIOGRAPHICAL: Name: [name], [name] Patient ID: [id number]  Birth Date: 05/09/1941 Height:    66.0 in. Gender: Female    [age]       Weight:    145.4 lbs. Ethnicity:  White  Exam Date: 05/23/2017 FRAX* RESULTS:  (version: 3.5) 10-year Probability of Fracture1 Major Osteoporotic Fracture2 Hip Fracture 11.2% 3.5% Population: [place] (Caucasian) Risk Factors: Family Hist. (Parent hip fracture) Based on Femur (Left) Neck BMD 1 -The 10-year probability of fracture may be lower than reported if the patient has received treatment. 2 -Major Osteoporotic Fracture: Clinical Spine, Forearm, Hip or Shoulder *FRAX is a Materials engineer of the [employer] for Metabolic Bone Disease, a [place] (WHO) [facility]. ASSESSMENT: The probability of a major osteoporotic fracture is 11.2% within the next ten years. The probability of a hip fracture is 3.5% within the next ten years. I have reviewed this report and agree with the above findings. Mark A. Thornton Papas, M.D. [facility] Electronically Signed   By: Lavonia Dana M.D.   On: 05/24/2017 14:25    Assessment & Plan:   Problem List Items Addressed This Visit    Abdominal aortic atherosclerosis ([place])    Noted on 2016 CT of abdomen.  Continue crestor , aspirin.       Acquired hypothyroidism    Thyroid function is WNL on current dose.  No current changes needed.   Lab Results  Component Value Date   TSH 2.79 09/02/2017         Familial hyperlipidemia    Managed with crestor,  LDL is 94 .  lfts normal.  No changes today   Lab Results  Component Value Date   CHOL 183 09/02/2017   HDL 50.90 09/02/2017   LDLCALC 94 09/02/2017   LDLDIRECT 93.0 09/02/2017   TRIG 189.0 (H) 09/02/2017   CHOLHDL 4 09/02/2017   Lab Results  Component Value Date   ALT 19 09/02/2017   AST 22 09/02/2017   ALKPHOS 40 09/02/2017   BILITOT 0.4 09/02/2017          Left carotid bruit    No evidence of hemodynamically significant stenosis on dopplers done by AV & V in 2015       Prediabetes    Improved a1c with low glycemic index diet.  Not exercising due to caregiver responsibilities.  Lab Results  Component Value Date   HGBA1C 5.8 09/02/2017         Raynaud disease    Suggested by history of blue fingers occurring with cold exposure without pain. Distal pulses and cap refill are normal  exam today .  No s&s of scleroderma         A total of 25 minutes of face to face time was spent with patient more than half of which was spent in counselling  about the above mentioned conditions  and coordination of care  I am having Ms. Swartzentruber maintain her aspirin EC, multivitamin with minerals, glucosamine-chondroitin, cholecalciferol, Fish Oil, diphenhydramine-acetaminophen, Cranberry, diltiazem, levothyroxine, and rosuvastatin.  No orders of the defined types were placed in this encounter.   There are no discontinued medications.  Follow-up: Return in about 6 months (around 03/06/2018).   [name], MD

## 2017-09-05 NOTE — Assessment & Plan Note (Signed)
Suggested by history of blue fingers occurring with cold exposure without pain. Distal pulses and cap refill are normal  exam today .  No s&s of scleroderma  

## 2017-09-05 NOTE — Assessment & Plan Note (Signed)
Improved a1c with low glycemic index diet.  Not exercising due to caregiver responsibilities.  Lab Results  Component Value Date   HGBA1C 5.8 09/02/2017

## 2017-09-05 NOTE — Patient Instructions (Signed)
reynaudRaynaud Phenomenon Raynaud phenomenon is a condition that affects the blood vessels (arteries) that carry blood to your fingers and toes. The arteries that supply blood to your ears or the tip of your nose might also be affected. Raynaud phenomenon causes the arteries to temporarily narrow. As a result, the flow of blood to the affected areas is temporarily decreased. This usually occurs in response to cold temperatures or stress. During an attack, the skin in the affected areas turns white. You may also feel tingling or numbness in those areas. Attacks usually last for only a brief period, and then the blood flow to the area returns to normal. In most cases, Raynaud phenomenon does not cause serious health problems. What are the causes? For many people with this condition, the cause is not known. Raynaud phenomenon is sometimes associated with other diseases, such as scleroderma or lupus. What increases the risk? Raynaud phenomenon can affect anyone, but it develops most often in people who are 93-68 years old. It affects more females than males. What are the signs or symptoms? Symptoms of Raynaud phenomenon may occur when you are exposed to cold temperatures or when you have emotional stress. The symptoms may last for a few minutes or up to several hours. They usually affect your fingers but may also affect your toes, ears, or the tip of your nose. Symptoms may include:  Changes in skin color. The skin in the affected areas will turn pale or white. The skin may then change from white to bluish to red as normal blood flow returns to the area.  Numbness, tingling, or pain in the affected areas.  In severe cases, sores may develop in the affected areas. How is this diagnosed? Your health care provider will do a physical exam and take your medical history. You may be asked to put your hands in cold water to check for a reaction to cold temperature. Blood tests may be done to check for other  diseases or conditions. Your health care provider may also order a test to check the movement of blood through your arteries and veins (vascular ultrasound). How is this treated? Treatment often involves making lifestyle changes and taking steps to control your exposure to cold temperatures. For more severe cases, medicine (calcium channel blockers) may be used to improve blood flow. Surgery is sometimes done to block the nerves that control the affected arteries, but this is rare. Follow these instructions at home:  Avoid exposure to cold by taking these steps: ? If possible, stay indoors during cold weather. ? When you go outside during cold weather, dress in layers and wear mittens, a hat, a scarf, and warm footwear. ? Wear mittens or gloves when handling ice or frozen food. ? Use holders for glasses or cans containing cold drinks. ? Let warm water run for a while before taking a shower or bath. ? Warm up the car before driving in cold weather.  If possible, avoid stressful and emotional situations. Exercise, meditation, and yoga may help you cope with stress. Biofeedback may be useful.  Do not use any tobacco products, including cigarettes, chewing tobacco, or electronic cigarettes. If you need help quitting, ask your health care provider.  Avoid secondhand smoke.  Limit your use of caffeine. Switch to decaffeinated coffee, tea, and soda. Avoid chocolate.  Wear loose fitting socks and comfortable, roomy shoes.  Avoid vibrating tools and machinery.  Take medicines only as directed by your health care provider. Contact a health care provider if:  Your discomfort becomes worse despite lifestyle changes.  You develop sores on your fingers or toes that do not heal.  Your fingers or toes turn black.  You have breaks in the skin on your fingers or toes.  You have a fever.  You have pain or swelling in your joints.  You have a rash.  Your symptoms occur on only one side of your  body. This information is not intended to replace advice given to you by your health care provider. Make sure you discuss any questions you have with your health care provider. Document Released: 11/05/2000 Document Revised: 04/15/2016 Document Reviewed: 05/12/2016 Elsevier Interactive Patient Education  2017 [employer].

## 2017-09-05 NOTE — Assessment & Plan Note (Signed)
Managed with crestor,  LDL is 94 .  lfts normal.  No changes today   Lab Results  Component Value Date   CHOL 183 09/02/2017   HDL 50.90 09/02/2017   LDLCALC 94 09/02/2017   LDLDIRECT 93.0 09/02/2017   TRIG 189.0 (H) 09/02/2017   CHOLHDL 4 09/02/2017   Lab Results  Component Value Date   ALT 19 09/02/2017   AST 22 09/02/2017   ALKPHOS 40 09/02/2017   BILITOT 0.4 09/02/2017

## 2017-09-05 NOTE — Assessment & Plan Note (Signed)
Noted on 2016 CT of abdomen.  Continue crestor , aspirin.

## 2017-09-14 ENCOUNTER — Other Ambulatory Visit: Payer: Self-pay | Admitting: Internal Medicine

## 2017-09-14 DIAGNOSIS — Z1231 Encounter for screening mammogram for malignant neoplasm of breast: Secondary | ICD-10-CM

## 2017-10-24 ENCOUNTER — Ambulatory Visit
Admission: RE | Admit: 2017-10-24 | Discharge: 2017-10-24 | Disposition: A | Discharge status: Home / Self Care | Payer: Medicare Other | Source: Ambulatory Visit | Attending: Internal Medicine | Admitting: Internal Medicine

## 2017-10-24 DIAGNOSIS — Z1231 Encounter for screening mammogram for malignant neoplasm of breast: Secondary | ICD-10-CM | POA: Diagnosis not present

## 2017-11-28 ENCOUNTER — Other Ambulatory Visit: Payer: Self-pay | Admitting: Internal Medicine

## 2017-12-13 DIAGNOSIS — D229 Melanocytic nevi, unspecified: Secondary | ICD-10-CM | POA: Diagnosis not present

## 2017-12-13 DIAGNOSIS — D1800 Hemangioma unspecified site: Secondary | ICD-10-CM | POA: Diagnosis not present

## 2017-12-13 DIAGNOSIS — L308 Other specified dermatitis: Secondary | ICD-10-CM | POA: Diagnosis not present

## 2017-12-13 DIAGNOSIS — L814 Other melanin hyperpigmentation: Secondary | ICD-10-CM | POA: Diagnosis not present

## 2017-12-13 DIAGNOSIS — L821 Other seborrheic keratosis: Secondary | ICD-10-CM | POA: Diagnosis not present

## 2018-02-23 ENCOUNTER — Other Ambulatory Visit: Payer: Self-pay | Admitting: Cardiovascular Disease

## 2018-02-23 ENCOUNTER — Other Ambulatory Visit: Payer: Self-pay | Admitting: Internal Medicine

## 2018-03-06 ENCOUNTER — Ambulatory Visit (INDEPENDENT_AMBULATORY_CARE_PROVIDER_SITE_OTHER): Payer: Medicare Other | Admitting: Internal Medicine

## 2018-03-06 ENCOUNTER — Encounter: Payer: Self-pay | Admitting: Internal Medicine

## 2018-03-06 VITALS — BP 132/76 | HR 76 | Temp 98.4°F | Resp 15 | Ht 67.0 in | Wt 147.2 lb

## 2018-03-06 DIAGNOSIS — R5383 Other fatigue: Secondary | ICD-10-CM | POA: Diagnosis not present

## 2018-03-06 DIAGNOSIS — E7849 Other hyperlipidemia: Secondary | ICD-10-CM

## 2018-03-06 DIAGNOSIS — Z23 Encounter for immunization: Secondary | ICD-10-CM

## 2018-03-06 DIAGNOSIS — E039 Hypothyroidism, unspecified: Secondary | ICD-10-CM

## 2018-03-06 DIAGNOSIS — R7303 Prediabetes: Secondary | ICD-10-CM

## 2018-03-06 LAB — LDL CHOLESTEROL, DIRECT: Direct LDL: 85 mg/dL

## 2018-03-06 LAB — COMPREHENSIVE METABOLIC PANEL
ALT: 21 U/L (ref 0–35)
AST: 25 U/L (ref 0–37)
Albumin: 4 g/dL (ref 3.5–5.2)
Alkaline Phosphatase: 41 U/L (ref 39–117)
BUN: 15 mg/dL (ref 6–23)
CO2: 29 mEq/L (ref 19–32)
Calcium: 9.3 mg/dL (ref 8.4–10.5)
Chloride: 103 mEq/L (ref 96–112)
Creatinine, Ser: 0.82 mg/dL (ref 0.40–1.20)
GFR: 71.89 mL/min (ref >60.00)
Glucose, Bld: 78 mg/dL (ref 70–99)
Potassium: 4.6 mEq/L (ref 3.5–5.1)
Sodium: 137 mEq/L (ref 135–145)
Total Bilirubin: 0.3 mg/dL (ref 0.2–1.2)
Total Protein: 7.1 g/dL (ref 6.0–8.3)

## 2018-03-06 LAB — LIPID PANEL
Cholesterol: 161 mg/dL (ref 0–200)
HDL: 51.2 mg/dL (ref >39.00)
NonHDL: 109.76
Total CHOL/HDL Ratio: 3
Triglycerides: 202 mg/dL — ABNORMAL HIGH (ref 0.0–149.0)
VLDL: 40.4 mg/dL — ABNORMAL HIGH (ref 0.0–40.0)

## 2018-03-06 LAB — TSH: TSH: 1.93 u[IU]/mL (ref 0.35–4.50)

## 2018-03-06 MED ORDER — ZOSTER VAC RECOMB ADJUVANTED 50 MCG/0.5ML IM SUSR: 0.5000 mL | Freq: Once | INTRAMUSCULAR | 1 refills | Status: AC

## 2018-03-06 NOTE — Patient Instructions (Addendum)
You are doing well!  I will see you again in 6 months and we will repeat your A1c then  You are due fo the last Pneumovax vaccine   The ShingRx vaccine is now available in local pharmacies and is much more protective thant Zostavaxs,  It is therefore ADVISED for all interested adults over 50 to prevent shingles

## 2018-03-06 NOTE — Progress Notes (Signed)
Subjective:  Patient ID: [name], female    DOB: 1941/07/24  Age: 77 y.o. MRN: [id number]  CC: The primary encounter diagnosis was Familial hyperlipidemia. Diagnoses of Acquired hypothyroidism, Need for 23-polyvalent pneumococcal polysaccharide vaccine, Other fatigue, and Prediabetes were also pertinent to this visit.  HPI TITA TERHAAR presents for HYPOTHYROID, HYPERLIPOIDEMIA PREDIABETES  LAST SEEN  IN OCTOER   HAS NOTED SOME DIZZINESS  AND ALTERED SENSE OF BALANCE ONLY WHEN WALKING IN [place]  ON TILED FLOOR   NOT OCCURRING AT HOME.  Wear bifocals,  Different ones when she is home.    She is the sole caregiver for her husband who suffers from mild vascular dementia and paraplegia from prior stroke. Marland Kitchen  She has assistance from her daughter frequently.  And manages to continue exercise ,  Averaging 3 HALF HOURS EXERCISE PERIODS PER WEEK .  Her sleep is fractured by interruptiosn due to husband's urinary frequency requiring use of a urinal every 2 hours        Outpatient Medications Prior to Visit  Medication Sig Dispense Refill  . aspirin EC 81 MG tablet Take 81 mg by mouth daily.      . cholecalciferol (VITAMIN D) 1000 UNITS tablet Take 2,000 Units by mouth daily.     . Cranberry 425 MG CAPS Take by mouth daily.    Marland Kitchen diltiazem (CARDIZEM CD) 120 MG 24 hr capsule TAKE 1 CAPSULE (120 MG TOTAL) BY MOUTH DAILY. 90 capsule 1  . diphenhydramine-acetaminophen (TYLENOL PM) 25-500 MG TABS tablet Take 1 tablet by mouth at bedtime as needed.    Marland Kitchen glucosamine-chondroitin ([facility] GLUCOSAMINE-CHONDROITIN) 500-400 MG tablet Take 1 tablet by mouth daily.     Marland Kitchen levothyroxine (SYNTHROID, LEVOTHROID) 50 MCG tablet TAKE 1 TABLET (50 MCG TOTAL) BY MOUTH DAILY. 90 tablet 1  . Multiple Vitamins-Minerals (MULTIVITAMIN WITH MINERALS) tablet Take 1 tablet by mouth daily.      . Omega-3 Fatty Acids (FISH OIL) 1000 MG CPDR Take by mouth daily.    . rosuvastatin (CRESTOR) 10 MG tablet TAKE 1 TABLET BY MOUTH  EVERY DAY 90 tablet 3  . diltiazem (CARDIZEM CD) 120 MG 24 hr capsule TAKE 1 CAPSULE (120 MG TOTAL) BY MOUTH DAILY. (Patient not taking: Reported on 03/06/2018) 30 capsule 3   No facility-administered medications prior to visit.     Review of Systems;  Patient denies headache, fevers, malaise, unintentional weight loss, skin rash, eye pain, sinus congestion and sinus pain, sore throat, dysphagia,  hemoptysis , cough, dyspnea, wheezing, chest pain, palpitations, orthopnea, edema, abdominal pain, nausea, melena, diarrhea, constipation, flank pain, dysuria, hematuria, urinary  Frequency, nocturia, numbness, tingling, seizures,  Focal weakness, Loss of consciousness,  Tremor, insomnia, depression, anxiety, and suicidal ideation.      Objective:  BP 132/76 (BP Location: Left Arm, Patient Position: Sitting, Cuff Size: Normal)   Pulse 76   Temp 98.4 F (36.9 C) (Oral)   Resp 15   Ht 5\' 7"  (1.702 m)   Wt 147 lb 3.2 oz (66.8 kg)   SpO2 97%   BMI 23.05 kg/m   BP Readings from Last 3 Encounters:  03/06/18 132/76  09/05/17 124/72  07/29/17 110/68    Wt Readings from Last 3 Encounters:  03/06/18 147 lb 3.2 oz (66.8 kg)  09/05/17 146 lb 9.6 oz (66.5 kg)  07/29/17 146 lb (66.2 kg)    General appearance: alert, cooperative and appears stated age Ears: normal TM's and external ear canals both ears Throat: lips,  mucosa, and tongue normal; teeth and gums normal Neck: no adenopathy, no carotid bruit, supple, symmetrical, trachea midline and thyroid not enlarged, symmetric, no tenderness/mass/nodules Back: symmetric, no curvature. ROM normal. No CVA tenderness. Lungs: clear to auscultation bilaterally Heart: regular rate and rhythm, S1, S2 normal, no murmur, click, rub or gallop Abdomen: soft, non-tender; bowel sounds normal; no masses,  no organomegaly Pulses: 2+ and symmetric Skin: Skin color, texture, turgor normal. No rashes or lesions Lymph nodes: Cervical, supraclavicular, and axillary  nodes normal.  Lab Results  Component Value Date   HGBA1C 5.8 09/02/2017   HGBA1C 6.0 03/04/2017   HGBA1C 6.0 02/13/2013    Lab Results  Component Value Date   CREATININE 0.82 03/06/2018   CREATININE 0.82 09/02/2017   CREATININE 0.81 03/04/2017    Lab Results  Component Value Date   WBC 4.6 09/02/2017   HGB 13.4 09/02/2017   HCT 40.6 09/02/2017   PLT 261.0 09/02/2017   GLUCOSE 78 03/06/2018   CHOL 161 03/06/2018   TRIG 202.0 (H) 03/06/2018   HDL 51.20 03/06/2018   LDLDIRECT 85.0 03/06/2018   LDLCALC 94 09/02/2017   ALT 21 03/06/2018   AST 25 03/06/2018   NA 137 03/06/2018   K 4.6 03/06/2018   CL 103 03/06/2018   CREATININE 0.82 03/06/2018   BUN 15 03/06/2018   CO2 29 03/06/2018   TSH 1.93 03/06/2018   HGBA1C 5.8 09/02/2017   MICROALBUR 0.1 12/07/2013    Mm Screening Breast Tomo Bilateral  Result Date: 10/24/2017 CLINICAL DATA:  Screening. EXAM: 2D DIGITAL SCREENING BILATERAL MAMMOGRAM WITH CAD AND ADJUNCT TOMO COMPARISON:  Previous exam(s). ACR Breast Density Category b: There are scattered areas of fibroglandular density. FINDINGS: There are no findings suspicious for malignancy. Images were processed with CAD. IMPRESSION: No mammographic evidence of malignancy. A result letter of this screening mammogram will be mailed directly to the patient. RECOMMENDATION: Screening mammogram in one year. (Code:SM-B-01Y) BI-RADS CATEGORY  1: Negative. Electronically Signed   By: Kristopher Oppenheim M.D.   On: 10/24/2017 12:49    Assessment & Plan:   Problem List Items Addressed This Visit    Prediabetes    Improved a1c with low glycemic index diet.  Now exercising due to caregiver responsibilities.  Lab Results  Component Value Date   HGBA1C 5.8 09/02/2017         Other fatigue    Screening labs have been repeatedly normal.  No history of snoring.  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 Encouraged daytime naps  if needed, and continued participation  in regular exercise program with goal of achieving a minimum of 30 minutes of aerobic activity 5 days per week.  No evidence of B12 deficiency.   Lab Results  Component Value Date   CREATININE 0.82 03/06/2018   Lab Results  Component Value Date   NA 137 03/06/2018   K 4.6 03/06/2018   CL 103 03/06/2018   CO2 29 03/06/2018   Lab Results  Component Value Date   WBC 4.6 09/02/2017   HGB 13.4 09/02/2017   HCT 40.6 09/02/2017   MCV 96.2 09/02/2017   PLT 261.0 09/02/2017   Lab Results  Component Value Date   VITAMINB12 705 03/04/2017         Familial hyperlipidemia - Primary    Managed with crestor,  LDL is 94 .  lfts normal.  No changes today   Lab Results  Component Value Date  CHOL 161 03/06/2018   HDL 51.20 03/06/2018   LDLCALC 94 09/02/2017   LDLDIRECT 85.0 03/06/2018   TRIG 202.0 (H) 03/06/2018   CHOLHDL 3 03/06/2018   Lab Results  Component Value Date   ALT 21 03/06/2018   AST 25 03/06/2018   ALKPHOS 41 03/06/2018   BILITOT 0.3 03/06/2018          Relevant Orders   Comprehensive metabolic panel (Completed)   Lipid panel (Completed)   Acquired hypothyroidism    Thyroid function is WNL on current dose.  No current changes needed.   Lab Results  Component Value Date   TSH 1.93 03/06/2018         Relevant Orders   TSH (Completed)    Other Visit Diagnoses    Need for 23-polyvalent pneumococcal polysaccharide vaccine       Relevant Orders   Pneumococcal polysaccharide vaccine 23-valent greater than or equal to 2yo subcutaneous/IM (Completed)    A total of 25 minutes of face to face time was spent with patient more than half of which was spent in counselling about the above mentioned conditions  and coordination of care   I am having Verdene Lennert. Funk start on Zoster Vaccine Adjuvanted. I am also having her maintain her aspirin EC, multivitamin with minerals, glucosamine-chondroitin, cholecalciferol, Fish  Oil, diphenhydramine-acetaminophen, Cranberry, rosuvastatin, diltiazem, and levothyroxine.  Meds ordered this encounter  Medications  . Zoster Vaccine Adjuvanted [facility]) injection    Sig: Inject 0.5 mLs into the muscle once for 1 dose.    Dispense:  1 each    Refill:  1    Medications Discontinued During This Encounter  Medication Reason  . diltiazem (CARDIZEM CD) 120 MG 24 hr capsule Duplicate    Follow-up: Return in about 6 months (around 09/05/2018).   [name], MD

## 2018-03-07 NOTE — Assessment & Plan Note (Signed)
Improved a1c with low glycemic index diet.  Now exercising due to caregiver responsibilities.  Lab Results  Component Value Date   HGBA1C 5.8 09/02/2017

## 2018-03-07 NOTE — Assessment & Plan Note (Signed)
Screening labs have been repeatedly normal.  No history of snoring.  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 Encouraged daytime naps if needed, and continued participation  in regular exercise program with goal of achieving a minimum of 30 minutes of aerobic activity 5 days per week.  No evidence of B12 deficiency.   Lab Results  Component Value Date   CREATININE 0.82 03/06/2018   Lab Results  Component Value Date   NA 137 03/06/2018   K 4.6 03/06/2018   CL 103 03/06/2018   CO2 29 03/06/2018   Lab Results  Component Value Date   WBC 4.6 09/02/2017   HGB 13.4 09/02/2017   HCT 40.6 09/02/2017   MCV 96.2 09/02/2017   PLT 261.0 09/02/2017   Lab Results  Component Value Date   VITAMINB12 705 03/04/2017

## 2018-03-07 NOTE — Assessment & Plan Note (Signed)
Managed with crestor,  LDL is 94 .  lfts normal.  No changes today   Lab Results  Component Value Date   CHOL 161 03/06/2018   HDL 51.20 03/06/2018   LDLCALC 94 09/02/2017   LDLDIRECT 85.0 03/06/2018   TRIG 202.0 (H) 03/06/2018   CHOLHDL 3 03/06/2018   Lab Results  Component Value Date   ALT 21 03/06/2018   AST 25 03/06/2018   ALKPHOS 41 03/06/2018   BILITOT 0.3 03/06/2018

## 2018-03-07 NOTE — Assessment & Plan Note (Signed)
Thyroid function is WNL on current dose.  No current changes needed.   Lab Results  Component Value Date   TSH 1.93 03/06/2018

## 2018-04-07 ENCOUNTER — Ambulatory Visit
Admission: EM | Admit: 2018-04-07 | Discharge: 2018-04-07 | Disposition: A | Discharge status: Home / Self Care | Payer: Medicare Other | Attending: Family Medicine | Admitting: Family Medicine

## 2018-04-07 ENCOUNTER — Encounter: Payer: Self-pay | Admitting: Emergency Medicine

## 2018-04-07 ENCOUNTER — Telehealth: Payer: Self-pay | Admitting: *Deleted

## 2018-04-07 DIAGNOSIS — R319 Hematuria, unspecified: Secondary | ICD-10-CM | POA: Diagnosis not present

## 2018-04-07 DIAGNOSIS — R300 Dysuria: Secondary | ICD-10-CM

## 2018-04-07 DIAGNOSIS — R509 Fever, unspecified: Secondary | ICD-10-CM | POA: Diagnosis not present

## 2018-04-07 DIAGNOSIS — N3001 Acute cystitis with hematuria: Secondary | ICD-10-CM

## 2018-04-07 DIAGNOSIS — R350 Frequency of micturition: Secondary | ICD-10-CM | POA: Diagnosis not present

## 2018-04-07 LAB — URINALYSIS, COMPLETE (UACMP) WITH MICROSCOPIC
Bacteria, UA: NONE SEEN
Bilirubin Urine: NEGATIVE
Glucose, UA: NEGATIVE mg/dL
Ketones, ur: NEGATIVE mg/dL
Nitrite: NEGATIVE
Specific Gravity, Urine: 1.015 (ref 1.005–1.030)
Squamous Epithelial / LPF: NONE SEEN (ref 0–5)
pH: 8.5 — ABNORMAL HIGH (ref 5.0–8.0)

## 2018-04-07 MED ORDER — CEPHALEXIN 500 MG PO CAPS: 500.0000 mg | ORAL_CAPSULE | Freq: Two times a day (BID) | ORAL | 0 refills | Status: DC

## 2018-04-07 MED ORDER — PHENAZOPYRIDINE HCL 200 MG PO TABS: 200.0000 mg | ORAL_TABLET | Freq: Three times a day (TID) | ORAL | 0 refills | Status: DC

## 2018-04-07 NOTE — Telephone Encounter (Signed)
Left message for patient call office ok to add to lab schedule for UA per PCP. PEC may schedule.

## 2018-04-07 NOTE — Telephone Encounter (Signed)
Certainly.  I have ordered the labs

## 2018-04-07 NOTE — Telephone Encounter (Signed)
Pain and burning with urination, up all night with feeling of urgency , small amount of blood in urine this am OK to have patient come and give urine?

## 2018-04-07 NOTE — ED Triage Notes (Signed)
Patient states she is having frequent urination, pressure and heaviness in her bladder.

## 2018-04-07 NOTE — Telephone Encounter (Signed)
Pt called back, went to walk in, no further testing needed. She had UA and got medications.

## 2018-04-07 NOTE — ED Provider Notes (Signed)
[facility]    CSN: [id number] Arrival date & time: 04/07/18  0931     History   Chief Complaint Chief Complaint  Patient presents with  . Recurrent UTI    HPI [name] is a 77 y.o. female.   HPI  77 year old female presents with frequent urination pressure and heaviness in her bladder urgency with  insatiety.  He denies any fever.  She has had chills last night.  Has had no nausea or vomiting and no back pain.  Denies any vaginal discharge.  She states that she has recurrent UTIs and this feels very similar.         Past Medical History:  Diagnosis Date  . Arthritis    knees  . Hyperlipidemia   . Palpitations    controlled by diltiazem  . Thyroid disease     Patient Active Problem List   Diagnosis Date Noted  . Prediabetes 09/05/2017  . Raynaud disease 09/05/2017  . Abdominal aortic atherosclerosis ([place]) 03/06/2017  . Sinus bradycardia 03/06/2017  . Visit for preventive health examination 09/05/2016  . Other fatigue 09/05/2016  . Chest pain 11/04/2015  . PVC's (premature ventricular contractions) 11/04/2015  . Pain in the chest 11/03/2015  . Tinnitus 09/17/2015  . Family history of colon cancer 09/17/2015  . Low back pain 06/24/2015  . Dysuria 03/08/2015  . Medicare annual wellness visit, subsequent 12/07/2013  . Left carotid bruit 12/07/2013  . Varicose veins of lower extremities with other complications 02/58/5277  . Vitamin D deficiency 08/04/2012  . Familial hyperlipidemia 11/17/2011  . Acquired hypothyroidism 11/17/2011    Past Surgical History:  Procedure Laterality Date  . ABDOMINAL HYSTERECTOMY     In 30's  . BROW LIFT Bilateral 07/12/2017   Procedure: BLEPHAROPLASTY upper eyelid with excess skin;  Surgeon: Karle Starch, MD;  Location: [place];  Service: Ophthalmology;  Laterality: Bilateral;  MAC  . COLONOSCOPY WITH PROPOFOL N/A 12/29/2015   Procedure: COLONOSCOPY WITH PROPOFOL;  Surgeon: Hulen Luster, MD;   Location: [facility] ENDOSCOPY;  Service: Gastroenterology;  Laterality: N/A;  . PTOSIS REPAIR Bilateral 07/12/2017   Procedure: ptosis repair resect ex;  Surgeon: Karle Starch, MD;  Location: [place];  Service: Ophthalmology;  Laterality: Bilateral;  Requests early  . VAGINAL PROLAPSE REPAIR  2014   Dr. Enzo Bi    OB History   None      Home Medications    Prior to Admission medications   Medication Sig Start Date End Date Taking? Authorizing Provider  aspirin EC 81 MG tablet Take 81 mg by mouth daily.     Yes [provider]  cholecalciferol (VITAMIN D) 1000 UNITS tablet Take 2,000 Units by mouth daily.    Yes [provider]  Cranberry 425 MG CAPS Take by mouth daily.   Yes [provider]  diltiazem (CARDIZEM CD) 120 MG 24 hr capsule TAKE 1 CAPSULE (120 MG TOTAL) BY MOUTH DAILY. 02/23/18  Yes Wellington Hampshire, MD  glucosamine-chondroitin ([facility] GLUCOSAMINE-CHONDROITIN) 500-400 MG tablet Take 1 tablet by mouth daily.    Yes [provider]  levothyroxine (SYNTHROID, LEVOTHROID) 50 MCG tablet TAKE 1 TABLET (50 MCG TOTAL) BY MOUTH DAILY. 02/23/18  Yes [name], MD  Multiple Vitamins-Minerals (MULTIVITAMIN WITH MINERALS) tablet Take 1 tablet by mouth daily.     Yes [provider]  Omega-3 Fatty Acids (FISH OIL) 1000 MG CPDR Take by mouth daily.   Yes [provider]  rosuvastatin (  CRESTOR) 10 MG tablet TAKE 1 TABLET BY MOUTH EVERY DAY 08/31/17  Yes [name], MD  cephALEXin (KEFLEX) 500 MG capsule Take 1 capsule (500 mg total) by mouth 2 (two) times daily. 04/07/18   Lorin Picket, PA-C  diphenhydramine-acetaminophen (TYLENOL PM) 25-500 MG TABS tablet Take 1 tablet by mouth at bedtime as needed.    [provider]  phenazopyridine (PYRIDIUM) 200 MG tablet Take 1 tablet (200 mg total) by mouth 3 (three) times daily. 04/07/18   Lorin Picket, PA-C    Family History Family History  Problem Relation  Age of Onset  . Breast cancer Mother        67's  . Heart attack Mother   . Diabetes Mother   . Stroke Father   . Diabetes Father   . Pancreatic cancer Sister   . Cancer Sister        pancreatic  . Cancer Brother        liver  . Diabetes Brother   . Cancer Sister        anal  . Heart attack Brother   . Diabetes Brother   . Cancer Brother        stomach  . Cancer Brother   . Diabetes Brother   . Colon cancer Neg Hx     Social History Social History   Tobacco Use  . Smoking status: Former Smoker    Years: 1.00    Types: Cigarettes  . Smokeless tobacco: Never Used  . Tobacco comment: Smoked x 1 year in her 20's  Substance Use Topics  . Alcohol use: No  . Drug use: No     Allergies   Prednisone   Review of Systems Review of Systems  Constitutional: Positive for activity change and chills. Negative for appetite change, diaphoresis, fatigue and fever.  Genitourinary: Positive for dysuria, frequency, hematuria and urgency. Negative for vaginal discharge.  All other systems reviewed and are negative.    Physical Exam Triage Vital Signs ED Triage Vitals  Enc Vitals Group     BP 04/07/18 1008 134/67     Pulse Rate 04/07/18 1008 (!) 58     Resp 04/07/18 1008 16     Temp 04/07/18 1008 97.6 F (36.4 C)     Temp Source 04/07/18 1008 Oral     SpO2 04/07/18 1008 100 %     Weight 04/07/18 1006 149 lb (67.6 kg)     Height 04/07/18 1006 _0  (1.702 m)     Head Circumference --      Peak Flow --      Pain Score 04/07/18 1005 0     Pain Loc --      Pain Edu? --      Excl. in [place]? --    No data found.  Updated Vital Signs BP 134/67 (BP Location: Left Arm)   Pulse (!) 58   Temp 97.6 F (36.4 C) (Oral)   Resp 16   Ht _1  (1.702 m)   Wt 149 lb (67.6 kg)   SpO2 100%   BMI 23.34 kg/m   Visual Acuity Right Eye Distance:   Left Eye Distance:   Bilateral Distance:    Right Eye Near:   Left Eye Near:    Bilateral Near:     Physical Exam    Constitutional: She is oriented to person, place, and time. She appears well-developed and well-nourished. No distress.  HENT:  Head: Normocephalic.  Eyes: Pupils are  equal, round, and reactive to light. Right eye exhibits no discharge. Left eye exhibits no discharge.  Neck: Normal range of motion.  Pulmonary/Chest: Effort normal and breath sounds normal.  Abdominal: Soft. Bowel sounds are normal. She exhibits no distension and no mass. There is no tenderness. There is no rebound and no guarding. No hernia.  No CVA tenderness  Musculoskeletal: Normal range of motion.  Neurological: She is alert and oriented to person, place, and time.  Skin: Skin is warm and dry. She is not diaphoretic.  Psychiatric: She has a normal mood and affect. Her behavior is normal. Judgment and thought content normal.  Nursing note and vitals reviewed.    UC Treatments / Results  Labs (all labs ordered are listed, but only abnormal results are displayed) Labs Reviewed  URINALYSIS, COMPLETE (UACMP) WITH MICROSCOPIC - Abnormal; Notable for the following components:      Result Value   pH 8.5 (*)    Hgb urine dipstick MODERATE (*)    Protein, ur TRACE (*)    Leukocytes, UA MODERATE (*)    All other components within normal limits  URINE CULTURE    EKG None  Radiology No results found.  Procedures Procedures (including critical care time)  Medications Ordered in UC Medications - No data to display  Initial Impression / Assessment and Plan / UC Course  I have reviewed the triage vital signs and the nursing notes.  Pertinent labs & imaging results that were available during my care of the patient were reviewed by me and considered in my medical decision making (see chart for details).     Plan: 1. Test/x-ray results and diagnosis reviewed with patient 2. rx as per orders; risks, benefits, potential side effects reviewed with patient 3. Recommend supportive treatment with hydration.  Treat with  Keflex.  Urine culture has been submitted which will be available in 48 hours.  If she is not improving she should follow-up with her primary care physician 4. F/u prn if symptoms worsen or don't improve  Final Clinical Impressions(s) / UC Diagnoses   Final diagnoses:  Acute cystitis with hematuria   Discharge Instructions   None    ED Prescriptions    Medication Sig Dispense Auth. Provider   cephALEXin (KEFLEX) 500 MG capsule Take 1 capsule (500 mg total) by mouth 2 (two) times daily. 10 capsule [name] P, PA-C   phenazopyridine (PYRIDIUM) 200 MG tablet Take 1 tablet (200 mg total) by mouth 3 (three) times daily. 6 tablet Lorin Picket, PA-C     Controlled Substance Prescriptions [place] Controlled Substance Registry consulted? Not Applicable   Lorin Picket, PA-C 04/07/18 1102

## 2018-04-07 NOTE — Telephone Encounter (Signed)
Patient stated she was on her way out of town so she went to a walkin clinic and received medication for UTI. FYI

## 2018-04-07 NOTE — Telephone Encounter (Signed)
Copied from [place] [id number]. Topic: Appointment Scheduling - Scheduling Inquiry for Clinic >> Apr 07, 2018  8:06 AM Scherrie Gerlach wrote: Reason for CRM: pt states she has been up all night, feeling like she has to urinate, burning, a small amount of blood this am. No appts in [place], pt states her husband in a wheel chair and she does not know where in other office is and unable to drive any other location. Pt would like to come by and give a UA to confirm and get an Rx please.

## 2018-04-09 LAB — URINE CULTURE: Culture: 80000 — AB

## 2018-04-10 ENCOUNTER — Telehealth (HOSPITAL_COMMUNITY): Payer: Self-pay

## 2018-04-10 NOTE — Telephone Encounter (Signed)
Urine culture positive for E.coli, this was treated with Keflex at urgent care visit. Attempted to reach patient regarding results. Number provided was not correct number for patient.

## 2018-04-18 NOTE — Telephone Encounter (Signed)
If she is feeling better she does not need a repeat urine.  If she wants a repeat urine to be evaluated and reviewed by me,  Then She will need to be put on the schedule as a work in and reviewed

## 2018-04-18 NOTE — Telephone Encounter (Addendum)
Relation to pt: self Call back number: [phone] Pharmacy: [id number] - [place], [place] [phone] (Phone) [phone] (Fax)    Reason for call:  Patient expressing abdominal pressure urgency to urinate and experiencing chills. Patient completed phenazopyridine (PYRIDIUM) 200 MG tablet and cephALEXin (KEFLEX) 500 MG capsule prescribed by urgent care on 04/07/18 but symtopms have not improved. Confirmed and updated patient contact information. Patient requesting urine orders, please advise

## 2018-04-18 NOTE — Telephone Encounter (Signed)
Spoke with pt and she has been scheduled to see Dr. Derrel Nip tomorrow at 4:00pm. Pt is aware of appt date and time.

## 2018-04-18 NOTE — Telephone Encounter (Signed)
Can pt drop off a urine or would you like to work her in for a possible UTI. Pt was treated for E. Coli found in the urine culture done by urgent and was treated with keflex on 04/07/2018. Pt stated that symptoms have not gotten better.

## 2018-04-19 ENCOUNTER — Ambulatory Visit: Payer: Medicare Other | Admitting: Internal Medicine

## 2018-04-21 ENCOUNTER — Other Ambulatory Visit (INDEPENDENT_AMBULATORY_CARE_PROVIDER_SITE_OTHER): Payer: Medicare Other

## 2018-04-21 ENCOUNTER — Telehealth: Payer: Self-pay

## 2018-04-21 DIAGNOSIS — R300 Dysuria: Secondary | ICD-10-CM | POA: Diagnosis not present

## 2018-04-21 LAB — URINALYSIS, ROUTINE W REFLEX MICROSCOPIC
Bilirubin Urine: NEGATIVE
Ketones, ur: NEGATIVE
Nitrite: NEGATIVE
Specific Gravity, Urine: <=1.005 — AB (ref 1.000–1.030)
Total Protein, Urine: NEGATIVE
Urine Glucose: NEGATIVE
Urobilinogen, UA: 0.2 (ref 0.0–1.0)
pH: 7 (ref 5.0–8.0)

## 2018-04-21 NOTE — Telephone Encounter (Signed)
Copied from [place] [id number]. Topic: Inquiry >> Apr 21, 2018  4:52 PM Arletha Grippe wrote: Reason for CRM: pt calling about labs - [phone]

## 2018-04-22 LAB — URINE CULTURE
MICRO NUMBER:: 90657710
SPECIMEN QUALITY:: ADEQUATE

## 2018-04-24 NOTE — Telephone Encounter (Signed)
Spoke with pt and informed her of her lab results that were sent via mychart. The pt gave a verbal understanding and stated that she is feeling much better. The pt stated that she took AZO all weekend and that seemed to help with her symptoms.

## 2018-05-15 DIAGNOSIS — M4712 Other spondylosis with myelopathy, cervical region: Secondary | ICD-10-CM | POA: Diagnosis not present

## 2018-07-30 ENCOUNTER — Ambulatory Visit
Admission: EM | Admit: 2018-07-30 | Discharge: 2018-07-30 | Disposition: A | Discharge status: Home / Self Care | Payer: Medicare Other | Attending: Emergency Medicine | Admitting: Emergency Medicine

## 2018-07-30 ENCOUNTER — Encounter: Payer: Self-pay | Admitting: Gynecology

## 2018-07-30 DIAGNOSIS — R3129 Other microscopic hematuria: Secondary | ICD-10-CM

## 2018-07-30 DIAGNOSIS — R300 Dysuria: Secondary | ICD-10-CM | POA: Diagnosis not present

## 2018-07-30 DIAGNOSIS — N390 Urinary tract infection, site not specified: Secondary | ICD-10-CM | POA: Diagnosis not present

## 2018-07-30 DIAGNOSIS — R3915 Urgency of urination: Secondary | ICD-10-CM | POA: Diagnosis not present

## 2018-07-30 DIAGNOSIS — R350 Frequency of micturition: Secondary | ICD-10-CM

## 2018-07-30 DIAGNOSIS — R809 Proteinuria, unspecified: Secondary | ICD-10-CM | POA: Diagnosis not present

## 2018-07-30 LAB — URINALYSIS, COMPLETE (UACMP) WITH MICROSCOPIC
Glucose, UA: NEGATIVE mg/dL
Nitrite: POSITIVE — AB
Protein, ur: >300 mg/dL — AB
RBC / HPF: >50 RBC/hpf (ref 0–5)
Specific Gravity, Urine: 1.02 (ref 1.005–1.030)
Squamous Epithelial / LPF: NONE SEEN (ref 0–5)
WBC, UA: >50 WBC/hpf (ref 0–5)
pH: 7 (ref 5.0–8.0)

## 2018-07-30 LAB — CBC WITH DIFFERENTIAL/PLATELET
Basophils Absolute: 0.1 10*3/uL (ref 0–0.1)
Basophils Relative: 1 %
Eosinophils Absolute: 0.1 10*3/uL (ref 0–0.7)
Eosinophils Relative: 1 %
HCT: 39 % (ref 35.0–47.0)
Hemoglobin: 13.2 g/dL (ref 12.0–16.0)
Lymphocytes Relative: 12 %
Lymphs Abs: 1.2 10*3/uL (ref 1.0–3.6)
MCH: 31.9 pg (ref 26.0–34.0)
MCHC: 33.8 g/dL (ref 32.0–36.0)
MCV: 94.4 fL (ref 80.0–100.0)
Monocytes Absolute: 0.8 10*3/uL (ref 0.2–0.9)
Monocytes Relative: 7 %
Neutro Abs: 8.2 10*3/uL — ABNORMAL HIGH (ref 1.4–6.5)
Neutrophils Relative %: 79 %
Platelets: 244 10*3/uL (ref 150–440)
RBC: 4.13 MIL/uL (ref 3.80–5.20)
RDW: 12.8 % (ref 11.5–14.5)
WBC: 10.3 10*3/uL (ref 3.6–11.0)

## 2018-07-30 LAB — COMPREHENSIVE METABOLIC PANEL
ALT: 21 U/L (ref 0–44)
AST: 26 U/L (ref 15–41)
Albumin: 4.2 g/dL (ref 3.5–5.0)
Alkaline Phosphatase: 51 U/L (ref 38–126)
Anion gap: 9 (ref 5–15)
BUN: 11 mg/dL (ref 8–23)
CO2: 27 mmol/L (ref 22–32)
Calcium: 9.3 mg/dL (ref 8.9–10.3)
Chloride: 101 mmol/L (ref 98–111)
Creatinine, Ser: 0.83 mg/dL (ref 0.44–1.00)
GFR calc Af Amer: >60 mL/min (ref >60)
GFR calc non Af Amer: >60 mL/min (ref >60)
Glucose, Bld: 126 mg/dL — ABNORMAL HIGH (ref 70–99)
Potassium: 4.5 mmol/L (ref 3.5–5.1)
Sodium: 137 mmol/L (ref 135–145)
Total Bilirubin: 0.4 mg/dL (ref 0.3–1.2)
Total Protein: 7.9 g/dL (ref 6.5–8.1)

## 2018-07-30 MED ORDER — NITROFURANTOIN MONOHYD MACRO 100 MG PO CAPS: 100.0000 mg | ORAL_CAPSULE | Freq: Two times a day (BID) | ORAL | 0 refills | Status: AC

## 2018-07-30 NOTE — Discharge Instructions (Signed)
Your UA today indicates a urinary tract infection. Since there was a large amount of blood in the urine as well as protein we checked a  CBC and CMP. Your white blood cell count was normal and your renal function also normal. Urine will be sent for culture. Begin Macrobid today. Increase rest and fluid intake. Your symptoms should improve over the next 2-3 days. Keep appointment with your PCP tomorrow. Return for re-examination if you develop fever, back pain, abdominal pain, nausea/vomiting, or any worsening of symptoms.

## 2018-07-30 NOTE — ED Triage Notes (Signed)
Per patient with lower pelvic pressure/ dark urine.

## 2018-07-30 NOTE — ED Provider Notes (Signed)
[facility]    CSN: [id number] Arrival date & time: 07/30/18  1240     History   Chief Complaint Chief Complaint  Patient presents with  . Urinary Tract Infection    HPI [name] is a 77 y.o. female. Patient presents with 4 day history of dysuria, increased frequency and urgency. States she took AZO for 2 days and symptoms improved. She stopped taking it recently and states that today she noticed that her urine was very darkly colored. She denies fever, back pain and abdominal pain. Admits to some mild pressure in the bladder area. Denies N/V. Patient has no history of renal disease. She has no other complaints/concerns today.  HPI  Past Medical History:  Diagnosis Date  . Arthritis    knees  . Hyperlipidemia   . Palpitations    controlled by diltiazem  . Thyroid disease     Patient Active Problem List   Diagnosis Date Noted  . Prediabetes 09/05/2017  . Raynaud disease 09/05/2017  . Abdominal aortic atherosclerosis ([place]) 03/06/2017  . Sinus bradycardia 03/06/2017  . Visit for preventive health examination 09/05/2016  . Other fatigue 09/05/2016  . Chest pain 11/04/2015  . PVC's (premature ventricular contractions) 11/04/2015  . Pain in the chest 11/03/2015  . Tinnitus 09/17/2015  . Family history of colon cancer 09/17/2015  . Low back pain 06/24/2015  . Dysuria 03/08/2015  . Medicare annual wellness visit, subsequent 12/07/2013  . Left carotid bruit 12/07/2013  . Varicose veins of lower extremities with other complications 95/63/8756  . Vitamin D deficiency 08/04/2012  . Familial hyperlipidemia 11/17/2011  . Acquired hypothyroidism 11/17/2011    Past Surgical History:  Procedure Laterality Date  . ABDOMINAL HYSTERECTOMY     In 30's  . BROW LIFT Bilateral 07/12/2017   Procedure: BLEPHAROPLASTY upper eyelid with excess skin;  Surgeon: Karle Starch, MD;  Location: [place];  Service: Ophthalmology;  Laterality: Bilateral;  MAC  .  COLONOSCOPY WITH PROPOFOL N/A 12/29/2015   Procedure: COLONOSCOPY WITH PROPOFOL;  Surgeon: Hulen Luster, MD;  Location: [facility] ENDOSCOPY;  Service: Gastroenterology;  Laterality: N/A;  . PTOSIS REPAIR Bilateral 07/12/2017   Procedure: ptosis repair resect ex;  Surgeon: Karle Starch, MD;  Location: [place];  Service: Ophthalmology;  Laterality: Bilateral;  Requests early  . VAGINAL PROLAPSE REPAIR  2014   Dr. Enzo Bi    OB History   None      Home Medications    Prior to Admission medications   Medication Sig Start Date End Date Taking? Authorizing Provider  aspirin EC 81 MG tablet Take 81 mg by mouth daily.     Yes [provider]  cholecalciferol (VITAMIN D) 1000 UNITS tablet Take 2,000 Units by mouth daily.    Yes [provider]  diltiazem (CARDIZEM CD) 120 MG 24 hr capsule TAKE 1 CAPSULE (120 MG TOTAL) BY MOUTH DAILY. 02/23/18  Yes Wellington Hampshire, MD  diphenhydramine-acetaminophen (TYLENOL PM) 25-500 MG TABS tablet Take 1 tablet by mouth at bedtime as needed.   Yes [provider]  glucosamine-chondroitin ([facility] GLUCOSAMINE-CHONDROITIN) 500-400 MG tablet Take 1 tablet by mouth daily.    Yes [provider]  levothyroxine (SYNTHROID, LEVOTHROID) 50 MCG tablet TAKE 1 TABLET (50 MCG TOTAL) BY MOUTH DAILY. 02/23/18  Yes [name], MD  meloxicam (MOBIC) 15 MG tablet meloxicam 15 mg tablet  TAKE 1 TABLET(S) EVERY DAY BY ORAL ROUTE WITH FOOD. 11/29/16  Yes [provider]  Multiple Vitamins-Minerals (MULTIVITAMIN WITH MINERALS) tablet Take 1 tablet by mouth daily.     Yes [provider]  rosuvastatin (CRESTOR) 10 MG tablet TAKE 1 TABLET BY MOUTH EVERY DAY 08/31/17  Yes [name], MD  cephALEXin (KEFLEX) 500 MG capsule Take 1 capsule (500 mg total) by mouth 2 (two) times daily. 04/07/18   Lorin Picket, PA-C  Cranberry 425 MG CAPS Take by mouth daily.    [provider]  nitrofurantoin,  macrocrystal-monohydrate, (MACROBID) 100 MG capsule Take 1 capsule (100 mg total) by mouth 2 (two) times daily for 5 days. 07/30/18 08/04/18  Laurene Footman B, PA-C  Omega-3 Fatty Acids (FISH OIL) 1000 MG CPDR Take by mouth daily.    [provider]  phenazopyridine (PYRIDIUM) 200 MG tablet Take 1 tablet (200 mg total) by mouth 3 (three) times daily. 04/07/18   Lorin Picket, PA-C    Family History Family History  Problem Relation Age of Onset  . Breast cancer Mother        17's  . Heart attack Mother   . Diabetes Mother   . Stroke Father   . Diabetes Father   . Pancreatic cancer Sister   . Cancer Sister        pancreatic  . Cancer Brother        liver  . Diabetes Brother   . Cancer Sister        anal  . Heart attack Brother   . Diabetes Brother   . Cancer Brother        stomach  . Cancer Brother   . Diabetes Brother   . Colon cancer Neg Hx     Social History Social History   Tobacco Use  . Smoking status: Former Smoker    Years: 1.00    Types: Cigarettes  . Smokeless tobacco: Never Used  . Tobacco comment: Smoked x 1 year in her 20's  Substance Use Topics  . Alcohol use: No  . Drug use: No     Allergies   Prednisone   Review of Systems Review of Systems  Constitutional: Positive for chills. Negative for fatigue and fever.  HENT: Negative for congestion and sore throat.   Respiratory: Negative for cough and shortness of breath.   Cardiovascular: Negative for chest pain.  Gastrointestinal: Negative for abdominal pain, nausea and vomiting.  Genitourinary: Positive for difficulty urinating, dysuria and urgency. Negative for flank pain, hematuria, pelvic pain, vaginal discharge and vaginal pain.  Musculoskeletal: Negative for arthralgias, back pain and myalgias.  Skin: Negative for rash.  Neurological: Negative for dizziness, weakness and light-headedness.  Hematological: Negative for adenopathy.     Physical Exam Triage Vital Signs ED Triage  Vitals  Enc Vitals Group     BP 07/30/18 1257 (!) 158/59     Pulse Rate 07/30/18 1257 81     Resp 07/30/18 1257 16     Temp 07/30/18 1257 98.3 F (36.8 C)     Temp Source 07/30/18 1257 Axillary     SpO2 07/30/18 1257 98 %     Weight 07/30/18 1300 150 lb (68 kg)     Height 07/30/18 1300 5' 8"  (1.727 m)     Head Circumference --      Peak Flow --      Pain Score 07/30/18 1259 6     Pain Loc --      Pain Edu? --      Excl. in [place]? --  No data found.  Updated Vital Signs BP (!) 158/59 (BP Location: Left Arm)   Pulse 81   Temp 98.3 F (36.8 C) (Axillary)   Resp 16   Ht 5' 8"  (1.727 m)   Wt 150 lb (68 kg)   SpO2 98%   BMI 22.81 kg/m       Physical Exam  Constitutional: She is oriented to person, place, and time. She appears well-developed and well-nourished. No distress.  HENT:  Head: Normocephalic and atraumatic.  Eyes: Conjunctivae and EOM are normal. No scleral icterus.  Cardiovascular: Normal rate, regular rhythm and normal heart sounds.  No murmur heard. Pulmonary/Chest: Effort normal and breath sounds normal. No respiratory distress. She has no wheezes.  Abdominal: Soft. Bowel sounds are normal. There is no tenderness. There is no rebound and no guarding.  Neurological: She is alert and oriented to person, place, and time.  Skin: Skin is warm and dry. No rash noted. She is not diaphoretic.  Psychiatric: She has a normal mood and affect. Her behavior is normal.  Nursing note and vitals reviewed.    UC Treatments / Results  Labs (all labs ordered are listed, but only abnormal results are displayed) Labs Reviewed  URINALYSIS, COMPLETE (UACMP) WITH MICROSCOPIC - Abnormal; Notable for the following components:      Result Value   Color, Urine AMBER (*)    APPearance CLOUDY (*)    Hgb urine dipstick LARGE (*)    Bilirubin Urine SMALL (*)    Ketones, ur TRACE (*)    Protein, ur >300 (*)    Nitrite POSITIVE (*)    Leukocytes, UA LARGE (*)    Bacteria, UA MANY  (*)    All other components within normal limits  CBC WITH DIFFERENTIAL/PLATELET - Abnormal; Notable for the following components:   Neutro Abs 8.2 (*)    All other components within normal limits  COMPREHENSIVE METABOLIC PANEL - Abnormal; Notable for the following components:   Glucose, Bld 126 (*)    All other components within normal limits  URINE CULTURE    EKG None  Radiology No results found.  Procedures Procedures (including critical care time)  Medications Ordered in UC Medications - No data to display  Initial Impression / Assessment and Plan / UC Course  I have reviewed the triage vital signs and the nursing notes.  Pertinent labs & imaging results that were available during my care of the patient were reviewed by me and considered in my medical decision making (see chart for details).   UA today indicated large blood, protein, leuks, nitrites. CBC and CMP performed due to the large blood and protein. Labs normal. No signs of ascending infection. Will treat for cystitis with Macrobid. Advised to keep f/u with PCP. Discussed red flag symptoms and when to return to the clinic or ER   Final Clinical Impressions(s) / UC Diagnoses   Final diagnoses:  Lower urinary tract infectious disease  Dysuria  Other microscopic hematuria  Proteinuria, unspecified type     Discharge Instructions     Your UA today indicates a urinary tract infection. Since there was a large amount of blood in the urine as well as protein we checked a  CBC and CMP. Your white blood cell count was normal and your renal function also normal. Urine will be sent for culture. Begin Macrobid today. Increase rest and fluid intake. Your symptoms should improve over the next 2-3 days. Keep appointment with your PCP tomorrow. Return for re-examination  if you develop fever, back pain, abdominal pain, nausea/vomiting, or any worsening of symptoms.    ED Prescriptions    Medication Sig Dispense Auth.  Provider   nitrofurantoin, macrocrystal-monohydrate, (MACROBID) 100 MG capsule Take 1 capsule (100 mg total) by mouth 2 (two) times daily for 5 days. 10 capsule Danton Clap, PA-C     Controlled Substance Prescriptions [place] Controlled Substance Registry consulted? Not Applicable   Gretta Cool 07/31/18 2426

## 2018-07-31 ENCOUNTER — Ambulatory Visit (INDEPENDENT_AMBULATORY_CARE_PROVIDER_SITE_OTHER): Payer: Medicare Other

## 2018-07-31 VITALS — BP 126/72 | HR 68 | Temp 97.3°F | Resp 15 | Ht 66.5 in | Wt 148.8 lb

## 2018-07-31 DIAGNOSIS — Z23 Encounter for immunization: Secondary | ICD-10-CM

## 2018-07-31 DIAGNOSIS — Z0000 Encounter for general adult medical examination without abnormal findings: Secondary | ICD-10-CM

## 2018-07-31 NOTE — Patient Instructions (Addendum)
  [name] , Thank you for taking time to come for your Medicare Wellness Visit. I appreciate your ongoing commitment to your health goals. Please review the following plan we discussed and let me know if I can assist you in the future.   Keep all routine maintenance appointments.   These are the goals we discussed: Goals    . DIET - INCREASE WATER INTAKE       This is a list of the screening recommended for you and due dates:  Health Maintenance  Topic Date Due  . Tetanus Vaccine  09/29/2026  . Flu Shot  Completed  . DEXA scan (bone density measurement)  Completed  . Pneumonia vaccines  Completed

## 2018-07-31 NOTE — Progress Notes (Addendum)
Subjective:   [name] is a 77 y.o. female who presents for Medicare Annual (Subsequent) preventive examination.  Review of Systems:  No ROS.  Medicare Wellness Visit. Additional risk factors are reflected in the social history. Cardiac Risk Factors include: advanced age (>36mn, >>26women)     Objective:     Vitals: BP 126/72 (BP Location: Left Arm, Patient Position: Sitting, Cuff Size: Normal)   Pulse 68   Temp (!) 97.3 F (36.3 C) (Oral)   Resp 15   Ht 5' 6.5" (1.689 m)   Wt 148 lb 12.8 oz (67.5 kg)   SpO2 97%   BMI 23.66 kg/m   Body mass index is 23.66 kg/m.  Advanced Directives 07/31/2018 07/30/2018 07/29/2017 07/12/2017 09/29/2016 07/27/2016 07/25/2015  Does Patient Have a Medical Advance Directive? No No No No Yes Yes Yes  Type of Advance Directive - - - - Living will [place]Living will [place]Living will  Does patient want to make changes to medical advance directive? Yes (MAU/Ambulatory/Procedural Areas - Information given) - - - - - No - Patient declined  Copy of HAli Chukin Chart? - - - - - No - copy requested No - copy requested  Would patient like information on creating a medical advance directive? - - No - Patient declined No - Patient declined - - -    Tobacco Social History   Tobacco Use  Smoking Status Former Smoker  . Years: 1.00  . Types: Cigarettes  Smokeless Tobacco Never Used  Tobacco Comment   Smoked x 1 year in her 20's     Counseling given: Not Answered Comment: Smoked x 1 year in her 20's   Clinical Intake:  Pre-visit preparation completed: Yes  Pain : No/denies pain     Nutritional Status: BMI of 19-24  Normal Diabetes: No  How often do you need to have someone help you when you read instructions, pamphlets, or other written materials from your doctor or pharmacy?: 1 - Never  Interpreter Needed?: No     Past Medical History:  Diagnosis Date  . Arthritis    knees  . Hyperlipidemia   . Palpitations    controlled by diltiazem  . Thyroid disease    Past Surgical History:  Procedure Laterality Date  . ABDOMINAL HYSTERECTOMY     In 30's  . BROW LIFT Bilateral 07/12/2017   Procedure: BLEPHAROPLASTY upper eyelid with excess skin;  Surgeon: FKarle Starch MD;  [place]  Service: Ophthalmology;  Laterality: Bilateral;  MAC  . COLONOSCOPY WITH PROPOFOL N/A 12/29/2015   Procedure: COLONOSCOPY WITH PROPOFOL;  Surgeon: PHulen Luster MD;  [facility]  Service: Gastroenterology;  Laterality: N/A;  . PTOSIS REPAIR Bilateral 07/12/2017   Procedure: ptosis repair resect ex;  Surgeon: FKarle Starch MD;  [place]  Service: Ophthalmology;  Laterality: Bilateral;  Requests early  . VAGINAL PROLAPSE REPAIR  2014   Dr. DEnzo Bi  Family History  Problem Relation Age of Onset  . Breast cancer Mother        640's . Heart attack Mother   . Diabetes Mother   . Stroke Father   . Diabetes Father   . Pancreatic cancer Sister   . Cancer Sister        pancreatic  . Cancer Brother        liver  . Diabetes Brother   . Cancer Sister  anal  . Heart attack Brother   . Diabetes Brother   . Cancer Brother        stomach  . Cancer Brother   . Diabetes Brother   . Colon cancer Neg Hx    Social History   Socioeconomic History  . Marital status: Married    Spouse name: Not on file  . Number of children: 2  . Years of education: Not on file  . Highest education level: Not on file  Occupational History  . Occupation: Retired Education officer, museum  Social Needs  . Financial resource strain: Not hard at all  . Food insecurity:    Worry: Never true    Inability: Never true  . Transportation needs:    Medical: No    Non-medical: No  Tobacco Use  . Smoking status: Former Smoker    Years: 1.00    Types: Cigarettes  . Smokeless tobacco: Never Used  . Tobacco comment: Smoked x 1 year in her 85's    Substance and Sexual Activity  . Alcohol use: No  . Drug use: No  . Sexual activity: Never  Lifestyle  . Physical activity:    Days per week: Not on file    Minutes per session: Not on file  . Stress: Not on file  Relationships  . Social connections:    Talks on phone: Not on file    Gets together: Not on file    Attends religious service: Not on file    Active member of club or organization: Not on file    Attends meetings of clubs or organizations: Not on file    Relationship status: Not on file  Other Topics Concern  . Not on file  Social History Narrative   Daily Caffeine Use:  One cup coffee in am    Regular Exercise -  5 days a week x 1 hour - gym, aerobic and wt training          Outpatient Encounter Medications as of 07/31/2018  Medication Sig  . aspirin EC 81 MG tablet Take 81 mg by mouth daily.    . cephALEXin (KEFLEX) 500 MG capsule Take 1 capsule (500 mg total) by mouth 2 (two) times daily.  . cholecalciferol (VITAMIN D) 1000 UNITS tablet Take 2,000 Units by mouth daily.   . Cranberry 425 MG CAPS Take by mouth daily.  Marland Kitchen diltiazem (CARDIZEM CD) 120 MG 24 hr capsule TAKE 1 CAPSULE (120 MG TOTAL) BY MOUTH DAILY.  . diphenhydramine-acetaminophen (TYLENOL PM) 25-500 MG TABS tablet Take 1 tablet by mouth at bedtime as needed.  Marland Kitchen glucosamine-chondroitin ([employer]S GLUCOSAMINE-CHONDROITIN) 500-400 MG tablet Take 1 tablet by mouth daily.   Marland Kitchen levothyroxine (SYNTHROID, LEVOTHROID) 50 MCG tablet TAKE 1 TABLET (50 MCG TOTAL) BY MOUTH DAILY.  . meloxicam (MOBIC) 15 MG tablet meloxicam 15 mg tablet  TAKE 1 TABLET(S) EVERY DAY BY ORAL ROUTE WITH FOOD.  . Multiple Vitamins-Minerals (MULTIVITAMIN WITH MINERALS) tablet Take 1 tablet by mouth daily.    . nitrofurantoin, macrocrystal-monohydrate, (MACROBID) 100 MG capsule Take 1 capsule (100 mg total) by mouth 2 (two) times daily for 5 days.  . Omega-3 Fatty Acids (FISH OIL) 1000 MG CPDR Take by mouth daily.  . rosuvastatin (CRESTOR) 10 MG  tablet TAKE 1 TABLET BY MOUTH EVERY DAY  . [DISCONTINUED] phenazopyridine (PYRIDIUM) 200 MG tablet Take 1 tablet (200 mg total) by mouth 3 (three) times daily.   No facility-administered encounter medications on file as of 07/31/2018.  Activities of Daily Living In your present state of health, do you have any difficulty performing the following activities: 07/31/2018  Hearing? N  Vision? N  Difficulty concentrating or making decisions? N  Walking or climbing stairs? N  Dressing or bathing? N  Doing errands, shopping? N  Preparing Food and eating ? N  Using the Toilet? N  In the past six months, have you accidently leaked urine? N  Do you have problems with loss of bowel control? N  Managing your Medications? N  Managing your Finances? N  Housekeeping or managing your Housekeeping? N  Some recent data might be hidden    Patient Care Team: [name], MD as PCP - General (Internal Medicine)    Assessment:   This is a routine wellness examination for [name].  The goal of the wellness visit is to assist the patient how to close the gaps in care and create a preventative care plan for the patient.   The roster of all physicians providing medical care to patient is listed in the Snapshot section of the chart.  Taking calcium VIT D as appropriate/Osteoporosis risk reviewed.    Safety issues reviewed; Smoke and carbon monoxide detectors in the home. No firearms or firearms locked in a safe within the home. Wears seatbelts when driving or riding with others. No violence in the home.  They do not have excessive sun exposure.  Discussed the need for sun protection: hats, long sleeves and the use of sunscreen if there is significant sun exposure.  Patient is alert, normal appearance, oriented to person/place/and time.  Correctly identified the president of the [place] and recalls of 3/3 words. Performs simple calculations and can read correct time from watch face.  Displays  appropriate judgement.  No new identified risk were noted.  No failures at ADL's or IADL's.   BMI- discussed the importance of a healthy diet, water intake and the benefits of aerobic exercise. Educational material provided.   24 hour diet recall: Low carb. Monitoring prediabetes closely.   Eye- Visual acuity not assessed per patient preference since they have regular follow up with the ophthalmologist.  Wears corrective lenses.  Sleep patterns- Sleeps without issues through the night.   High dose influenza administered L deltoid, tolerated well. Educational material provided.  Health maintenance gaps- closed.  Patient Concerns: None at this time. Follow up with PCP as needed.  Exercise Activities and Dietary recommendations Current Exercise Habits: Home exercise routine, Type of exercise: walking, Time (Minutes): 60, Frequency (Times/Week): 4, Weekly Exercise (Minutes/Week): 240, Intensity: Moderate  Goals    . DIET - INCREASE WATER INTAKE       Fall Risk Fall Risk  07/31/2018 07/29/2017 07/27/2016 07/25/2015 12/07/2013  Falls in the past year? _0    Depression Screen PHQ 2/9 Scores 07/31/2018 07/29/2017 07/27/2016 07/25/2015  PHQ - 2 Score 0 0 0 0  PHQ- 9 Score - 0 - -     Cognitive Function MMSE - Mini Mental State Exam 07/31/2018 07/29/2017 07/27/2016 07/25/2015  Orientation to time _1 Orientation to Place _2 Registration _3 Attention/ Calculation _4 Recall _5 Language- name 2 objects _6 Language- repeat _7 Language- follow 3 step command _8 Language- read & follow direction _9 Write a sentence _10 1  Copy design _0 Total score _1 Immunization History  Administered Date(s) Administered  . Influenza Split 08/18/2011, 08/04/2012  . Influenza Whole 09/10/2013  . Influenza, High Dose Seasonal PF 07/25/2015, 07/27/2016, 07/29/2017, 07/31/2018  . Influenza,inj,Quad PF,6+ Mos 09/06/2014  .  Pneumococcal Conjugate-13 12/07/2013  . Pneumococcal Polysaccharide-23 12/18/2007, 03/06/2018  . Tdap 11/20/2012, 09/29/2016  . Zoster 12/18/2007   Screening Tests Health Maintenance  Topic Date Due  . TETANUS/TDAP  09/29/2026  . INFLUENZA VACCINE  Completed  . DEXA SCAN  Completed  . PNA vac Low Risk Adult  Completed      Plan:   End of life planning; Advance aging; Advanced directives discussed. Copy of current HCPOA/Living Will requested upon completion.    I have personally reviewed and noted the following in the patient's chart:   . Medical and social history . Use of alcohol, tobacco or illicit drugs  . Current medications and supplements . Functional ability and status . Nutritional status . Physical activity . Advanced directives . List of other physicians . Hospitalizations, surgeries, and ER visits in previous 12 months . Vitals . Screenings to include cognitive, depression, and falls . Referrals and appointments  In addition, I have reviewed and discussed with patient certain preventive protocols, quality metrics, and best practice recommendations. A written personalized care plan for preventive services as well as general preventive health recommendations were provided to patient.     OBrien-Blaney, Adalyna Godbee L, LPN  05/25/1637    I have reviewed the above information and agree with above.   Deborra Medina, MD

## 2018-08-02 LAB — URINE CULTURE: Culture: >=100000 — AB

## 2018-08-03 ENCOUNTER — Telehealth (HOSPITAL_COMMUNITY): Payer: Self-pay

## 2018-08-03 NOTE — Telephone Encounter (Signed)
Urine culture positive for E.coli this was treated with Macrobid at [facility] visit. Pt called and made aware to finish antibiotics. Verbalized understanding.

## 2018-08-16 DIAGNOSIS — H1851 Endothelial corneal dystrophy: Secondary | ICD-10-CM | POA: Diagnosis not present

## 2018-08-19 ENCOUNTER — Other Ambulatory Visit: Payer: Self-pay | Admitting: Internal Medicine

## 2018-08-19 ENCOUNTER — Other Ambulatory Visit: Payer: Self-pay | Admitting: Cardiovascular Disease

## 2018-08-22 ENCOUNTER — Other Ambulatory Visit: Payer: Self-pay | Admitting: Internal Medicine

## 2018-08-27 ENCOUNTER — Other Ambulatory Visit: Payer: Self-pay | Admitting: Internal Medicine

## 2018-09-06 ENCOUNTER — Ambulatory Visit: Payer: Medicare Other | Admitting: Internal Medicine

## 2018-09-15 ENCOUNTER — Other Ambulatory Visit: Payer: Self-pay

## 2018-09-15 MED ORDER — LEVOTHYROXINE SODIUM 50 MCG PO TABS: ORAL_TABLET | ORAL | 1 refills | Status: DC

## 2018-09-15 MED ORDER — ROSUVASTATIN CALCIUM 10 MG PO TABS: 10.0000 mg | ORAL_TABLET | Freq: Every day | ORAL | 1 refills | Status: DC

## 2018-09-16 ENCOUNTER — Other Ambulatory Visit: Payer: Self-pay | Admitting: Cardiovascular Disease

## 2018-09-18 ENCOUNTER — Other Ambulatory Visit: Payer: Self-pay

## 2018-09-18 NOTE — Telephone Encounter (Signed)
Patient has a follow up with Dr. Fletcher Anon on 09/21/2018. The patient already was given 1 month supply.

## 2018-09-20 ENCOUNTER — Encounter: Payer: Self-pay | Admitting: Physician Assistant

## 2018-09-20 NOTE — Progress Notes (Signed)
Cardiology Office Note Date:  09/21/2018  Patient ID:  Ikram, Riebe Jul 08, 1941, MRN [id number] PCP:  [name], MD  Cardiologist:  Dr. Fletcher Anon, MD   Chief Complaint: Follow-up/chest pain  History of Present Illness: ADALYNN CORNE is a 77 y.o. female with history of symptomatic PVCs, hypothyroidism, and hyperlipidemia who presents for follow-up of PVCs.  Patient was previously evaluated in 10/2015 with chest pain and underwent a treadmill nuclear stress test which showed no evidence of ischemia with a low normal EF.  She was noted to be hypertensive on presentation with hypertensive response to exercise.  Given this, she was started on metoprolol tartrate 25 mg twice daily.  However, she did not tolerate this medication secondary to fatigue.  A Holter was performed in 01/2016 and showed 5300 PVCs in 48 hours representing a 3% burden, occasional PACs, and short runs of SVT.  She was switched to extended release diltiazem 120 mg daily.  Since adding diltiazem she reported she had been doing very well with resolution of palpitations and no further episodes of chest pain when she was most recently evaluated in 03/2017.  It was also noted that she had recently been bradycardic at routine visit at the outside office with heart rate of 51 leading to her diltiazem to be switched to every other day.  With this, she felt worse, had elevated BP, and self resumed it back to once daily dosing.  When she was last seen in the office on 04/05/2017 she was noted to have a blood pressure 118/60 with a heart rate of 70 bpm.  Given that she was asymptomatic with a mildly bradycardic heart rate it was recommended she continue Cardizem CD 120 mg once daily.  Patient comes in doing reasonably well today.  She has noted over the past 5 weeks 2 episodes of right-sided jaw pain that radiated down into the right anterior chest.  Both episodes occurred while driving.  She chewed 1 full dose aspirin with each  episode with resolution of symptoms.  Patient denies any exertional symptoms.  Of note, since these episodes the patient has continued to walk on the treadmill 3 to 4 days/week without any symptoms.  She denies any associated palpitations, shortness of breath, diaphoresis, nausea, vomiting, dizziness, presyncope, or syncope.  She reports well-controlled blood pressure on Cardizem.  She denies any symptoms concerning for breakthrough PVCs.  She has no lower extremity swelling, orthopnea, abdominal distention, or early satiety.  She is currently chest pain-free.   Past Medical History:  Diagnosis Date  . Arthritis    knees  . Hyperlipidemia   . Hypothyroidism   . PVC's (premature ventricular contractions)    a.  48-hour Holter in 3/17 demonstrated NSR, frequent PVCs totaling 5300 beats representing 3% burden, occasional PACs, short runs of SVT    Past Surgical History:  Procedure Laterality Date  . ABDOMINAL HYSTERECTOMY     In 30's  . BROW LIFT Bilateral 07/12/2017   Procedure: BLEPHAROPLASTY upper eyelid with excess skin;  Surgeon: Karle Starch, MD;  Location: [place];  Service: Ophthalmology;  Laterality: Bilateral;  MAC  . COLONOSCOPY WITH PROPOFOL N/A 12/29/2015   Procedure: COLONOSCOPY WITH PROPOFOL;  Surgeon: Hulen Luster, MD;  Location: [facility]COPY;  Service: Gastroenterology;  Laterality: N/A;  . PTOSIS REPAIR Bilateral 07/12/2017   Procedure: ptosis repair resect ex;  Surgeon: Karle Starch, MD;  Location: [place];  Service: Ophthalmology;  Laterality: Bilateral;  Requests early  .  VAGINAL PROLAPSE REPAIR  2014   Dr. Enzo Bi    Current Meds  Medication Sig  . aspirin EC 81 MG tablet Take 81 mg by mouth daily.    . cholecalciferol (VITAMIN D) 1000 UNITS tablet Take 2,000 Units by mouth daily.   . Cranberry 425 MG CAPS Take by mouth daily.  Marland Kitchen diltiazem (CARDIZEM CD) 120 MG 24 hr capsule TAKE 1 CAPSULE BY MOUTH EVERY DAY  . diphenhydramine-acetaminophen  (TYLENOL PM) 25-500 MG TABS tablet Take 1 tablet by mouth at bedtime as needed.  Marland Kitchen glucosamine-chondroitin (CVS GLUCOSAMINE-CHONDROITIN) 500-400 MG tablet Take 1 tablet by mouth daily.   Marland Kitchen levothyroxine (SYNTHROID, LEVOTHROID) 50 MCG tablet TAKE 1 TABLET (50 MCG TOTAL) BY MOUTH DAILY.  . Multiple Vitamins-Minerals (MULTIVITAMIN WITH MINERALS) tablet Take 1 tablet by mouth daily.    . Omega-3 Fatty Acids (FISH OIL) 1000 MG CPDR Take by mouth daily.  . rosuvastatin (CRESTOR) 10 MG tablet Take 1 tablet (10 mg total) by mouth daily.    Allergies:   Prednisone   Social History:  The patient  reports that she has quit smoking. Her smoking use included cigarettes. She quit after 1.00 year of use. She has never used smokeless tobacco. She reports that she does not drink alcohol or use drugs.   Family History:  The patient's family history includes Breast cancer in her mother; Cancer in her brother, brother, brother, sister, and sister; Diabetes in her brother, brother, brother, father, and mother; Heart attack in her brother and mother; Pancreatic cancer in her sister; Stroke in her father.  ROS:   Review of Systems  Constitutional: Negative for chills, diaphoresis, fever, malaise/fatigue and weight loss.  HENT: Negative for congestion.        Jaw pain  Eyes: Negative for discharge and redness.  Respiratory: Negative for cough, hemoptysis, sputum production, shortness of breath and wheezing.   Cardiovascular: Positive for chest pain. Negative for palpitations, orthopnea, claudication, leg swelling and PND.  Gastrointestinal: Negative for abdominal pain, blood in stool, heartburn, melena, nausea and vomiting.  Genitourinary: Negative for hematuria.  Musculoskeletal: Negative for falls and myalgias.  Skin: Negative for rash.  Neurological: Negative for dizziness, tingling, tremors, sensory change, speech change, focal weakness, loss of consciousness and weakness.  Endo/Heme/Allergies: Does not  bruise/bleed easily.  Psychiatric/Behavioral: Negative for substance abuse. The patient is not nervous/anxious.   All other systems reviewed and are negative.    PHYSICAL EXAM:  VS:  BP 140/80 (BP Location: Left Arm, Patient Position: Sitting, Cuff Size: Normal)   Pulse 66   Ht 5' 7"  (1.702 m)   Wt 147 lb 4 oz (66.8 kg)   BMI 23.06 kg/m  BMI: Body mass index is 23.06 kg/m.  Physical Exam  Constitutional: She is oriented to person, place, and time. She appears well-developed and well-nourished.  HENT:  Head: Normocephalic and atraumatic.  Eyes: Right eye exhibits no discharge. Left eye exhibits no discharge.  Neck: Normal range of motion. No JVD present.  Cardiovascular: Normal rate, regular rhythm, S1 normal, S2 normal and normal heart sounds. Exam reveals no distant heart sounds, no friction rub, no midsystolic click and no opening snap.  No murmur heard. Pulses:      Posterior tibial pulses are 2+ on the right side, and 2+ on the left side.  Pulmonary/Chest: Effort normal and breath sounds normal. No respiratory distress. She has no decreased breath sounds. She has no wheezes. She has no rales. She exhibits no tenderness.  Abdominal: Soft.  She exhibits no distension. There is no tenderness.  Musculoskeletal: She exhibits no edema.  Neurological: She is alert and oriented to person, place, and time.  Skin: Skin is warm and dry. No cyanosis. Nails show no clubbing.  Psychiatric: She has a normal mood and affect. Her speech is normal and behavior is normal. Judgment and thought content normal.     EKG:  Was ordered and interpreted by me today. Shows NSR, 66 bpm, no acute st/t changes   Recent Labs: 03/06/2018: TSH 1.93 07/30/2018: ALT 21; BUN 11; Creatinine, Ser 0.83; Hemoglobin 13.2; Platelets 244; Potassium 4.5; Sodium 137  03/06/2018: Cholesterol 161; Direct LDL 85.0; HDL 51.20; Total CHOL/HDL Ratio 3; Triglycerides 202.0; VLDL 40.4   CrCl cannot be calculated (Patient's most  recent lab result is older than the maximum 21 days allowed.).   Wt Readings from Last 3 Encounters:  09/21/18 147 lb 4 oz (66.8 kg)  07/31/18 148 lb 12.8 oz (67.5 kg)  07/30/18 150 lb (68 kg)     Other studies reviewed: Additional studies/records reviewed today include: summarized above  ASSESSMENT AND PLAN:  1. Chest pain with moderate risk for cardiac etiology: Currently symptom-free.  Symptoms seem to be somewhat atypical however she reports resolution with low-dose aspirin.  Patient indicates that she has a lot on her plate currently and would like further evaluation.  Discussed invasive versus noninvasive evaluation techniques.  We have agreed to proceed with cardiac CT with FFR as indicated.  Recommend aggressive risk factor modification.  Recent direct LDL of 85 and 02/2018.  She remains on Crestor 10 mg daily.  2. Frequent PVCs: Resolved with Cardizem CD 120 mg daily.  Check BMP.  3. Hypertension: Blood pressure is mildly elevated today at 140/80.  Refill Cardizem as above.  She has follow-up with PCP on 11/1.  Disposition: F/u with Dr. Fletcher Anon or an APP in 12 months unless cardiac CT is abnormal in which we will evaluate her sooner.  Current medicines are reviewed at length with the patient today.  The patient did not have any concerns regarding medicines.  Signed, Christell Faith, PA-C 09/21/2018 3:19 PM     [place] [place], [place] [address] [phone]

## 2018-09-21 ENCOUNTER — Encounter: Payer: Self-pay | Admitting: Physician Assistant

## 2018-09-21 ENCOUNTER — Ambulatory Visit: Payer: Medicare Other | Admitting: Physician Assistant

## 2018-09-21 VITALS — BP 140/80 | HR 66 | Ht 67.0 in | Wt 147.2 lb

## 2018-09-21 DIAGNOSIS — I493 Ventricular premature depolarization: Secondary | ICD-10-CM | POA: Diagnosis not present

## 2018-09-21 DIAGNOSIS — I10 Essential (primary) hypertension: Secondary | ICD-10-CM

## 2018-09-21 DIAGNOSIS — R072 Precordial pain: Secondary | ICD-10-CM | POA: Diagnosis not present

## 2018-09-21 MED ORDER — DILTIAZEM HCL ER COATED BEADS 120 MG PO CP24: ORAL_CAPSULE | ORAL | 0 refills | Status: DC

## 2018-09-21 MED ORDER — METOPROLOL TARTRATE 50 MG PO TABS: ORAL_TABLET | ORAL | 0 refills | Status: DC

## 2018-09-21 MED ORDER — DILTIAZEM HCL ER COATED BEADS 120 MG PO CP24: ORAL_CAPSULE | ORAL | 3 refills | Status: DC

## 2018-09-21 NOTE — Patient Instructions (Addendum)
Medication Instructions:  No changes  If you need a refill on your cardiac medications before your next appointment, please call your pharmacy.   Lab work: None ordered for now. When the Cardiac Ct is scheduled, you will need a BMET. You may go to [facility].  Testing/Procedures: Your physician has requested that you have cardiac CT. Cardiac computed tomography (CT) is a painless test that uses an x-ray machine to take clear, detailed pictures of your heart. For further information please visit [email or web address]. Please follow instruction sheet as given.   Follow-Up: At [facility], you and your health needs are our priority.  As part of our continuing mission to provide you with exceptional heart care, we have created designated Provider Care Teams.  These Care Teams include your primary Cardiologist (physician) and Advanced Practice Providers (APPs -  Physician Assistants and Nurse Practitioners) who all work together to provide you with the care you need, when you need it. You will need a follow up appointment in 12 months.  Please call our office 2 months in advance to schedule this appointment.  You may see Dr. Fletcher Anon or one of the following Advanced Practice Providers on your designated Care Team:   Murray Hodgkins, NP Christell Faith, PA-C . Marrianne Mood, PA-C    Any Other Special Instructions Will Be Listed Below (If Applicable).Please arrive at the [facility] main entrance of [facility] at xx:xx AM (30-45 minutes prior to test start time)  [facility] [place], [place] [address] [phone]  Proceed to the [facility] Radiology Department (First Floor).  Please follow these instructions carefully (unless otherwise directed):  On the Night Before the Test: . Be sure to Drink plenty of water. . Do not consume any caffeinated/decaffeinated beverages or chocolate 12 hours prior to your test. . Do not take any antihistamines 12  hours prior to your test.  On the Day of the Test: . Drink plenty of water. Do not drink any water within one hour of the test. . Do not eat any food 4 hours prior to the test. . You may take your regular medications prior to the test.  . Take metoprolol (Lopressor) two hours prior to test.      After the Test: . Drink plenty of water. . After receiving IV contrast, you may experience a mild flushed feeling. This is normal. . On occasion, you may experience a mild rash up to 24 hours after the test. This is not dangerous. If this occurs, you can take Benadryl 25 mg and increase your fluid intake. . If you experience trouble breathing, this can be serious. If it is severe call 911 IMMEDIATELY. If it is mild, please call our office. . If you take any of these medications: Glipizide/Metformin, Avandament, Glucavance, please do not take 48 hours after completing test.

## 2018-09-22 ENCOUNTER — Ambulatory Visit (INDEPENDENT_AMBULATORY_CARE_PROVIDER_SITE_OTHER): Payer: Medicare Other | Admitting: Internal Medicine

## 2018-09-22 ENCOUNTER — Encounter: Payer: Self-pay | Admitting: Internal Medicine

## 2018-09-22 VITALS — BP 146/88 | HR 44 | Temp 98.1°F | Resp 14 | Ht 67.0 in | Wt 148.4 lb

## 2018-09-22 DIAGNOSIS — E039 Hypothyroidism, unspecified: Secondary | ICD-10-CM

## 2018-09-22 DIAGNOSIS — R0989 Other specified symptoms and signs involving the circulatory and respiratory systems: Secondary | ICD-10-CM | POA: Diagnosis not present

## 2018-09-22 DIAGNOSIS — E7849 Other hyperlipidemia: Secondary | ICD-10-CM

## 2018-09-22 DIAGNOSIS — Z0000 Encounter for general adult medical examination without abnormal findings: Secondary | ICD-10-CM | POA: Diagnosis not present

## 2018-09-22 DIAGNOSIS — R7303 Prediabetes: Secondary | ICD-10-CM

## 2018-09-22 MED ORDER — TELMISARTAN 20 MG PO TABS: 20.0000 mg | ORAL_TABLET | Freq: Every day | ORAL | 0 refills | Status: DC

## 2018-09-22 MED ORDER — ZOSTER VAC RECOMB ADJUVANTED 50 MCG/0.5ML IM SUSR: 0.5000 mL | Freq: Once | INTRAMUSCULAR | 1 refills | Status: AC

## 2018-09-22 NOTE — Patient Instructions (Signed)
Your annual mammogram has been ordered.  It is due in December  You are encouraged (required) to call to make your appointment at [facility] [phone]   The [employer] vaccine is now available At [facility] on [facility].     Ask your husband's urologist about using a "condom catheter" at night so you can get more sleep!   I am adding a low dose of telmisartan for your blood pressure.  Take it daily in the morning.  Continue diltiazem at nigjt.  Please return in 1 week to have your non fasting blood test    Preventive Care 65 Years and Older, Female Preventive care refers to lifestyle choices and visits with your health care provider that can promote health and wellness. What does preventive care include?  A yearly physical exam. This is also called an annual well check.  Dental exams once or twice a year.  Routine eye exams. Ask your health care provider how often you should have your eyes checked.  Personal lifestyle choices, including: ? Daily care of your teeth and gums. ? Regular physical activity. ? Eating a healthy diet. ? Avoiding tobacco and drug use. ? Limiting alcohol use. ? Practicing safe sex. ? Taking low-dose aspirin every day. ? Taking vitamin and mineral supplements as recommended by your health care provider. What happens during an annual well check? The services and screenings done by your health care provider during your annual well check will depend on your age, overall health, lifestyle risk factors, and family history of disease. Counseling Your health care provider may ask you questions about your:  Alcohol use.  Tobacco use.  Drug use.  Emotional well-being.  Home and relationship well-being.  Sexual activity.  Eating habits.  History of falls.  Memory and ability to understand (cognition).  Work and work Statistician.  Reproductive health.  Screening You may have the following tests or measurements:  Height, weight,  and BMI.  Blood pressure.  Lipid and cholesterol levels. These may be checked every 5 years, or more frequently if you are over 18 years old.  Skin check.  Lung cancer screening. You may have this screening every year starting at age 54 if you have a 30-pack-year history of smoking and currently smoke or have quit within the past 15 years.  Fecal occult blood test (FOBT) of the stool. You may have this test every year starting at age 71.  Flexible sigmoidoscopy or colonoscopy. You may have a sigmoidoscopy every 5 years or a colonoscopy every 10 years starting at age 29.  Hepatitis C blood test.  Hepatitis B blood test.  Sexually transmitted disease (STD) testing.  Diabetes screening. This is done by checking your blood sugar (glucose) after you have not eaten for a while (fasting). You may have this done every 1-3 years.  Bone density scan. This is done to screen for osteoporosis. You may have this done starting at age 17.  Mammogram. This may be done every 1-2 years. Talk to your health care provider about how often you should have regular mammograms.  Talk with your health care provider about your test results, treatment options, and if necessary, the need for more tests. Vaccines Your health care provider may recommend certain vaccines, such as:  Influenza vaccine. This is recommended every year.  Tetanus, diphtheria, and acellular pertussis (Tdap, Td) vaccine. You may need a Td booster every 10 years.  Varicella vaccine. You may need this if you have not been vaccinated.  Zoster vaccine. You may need this after age 63.  Measles, mumps, and rubella (MMR) vaccine. You may need at least one dose of MMR if you were born in 1957 or later. You may also need a second dose.  Pneumococcal 13-valent conjugate (PCV13) vaccine. One dose is recommended after age 26.  Pneumococcal polysaccharide (PPSV23) vaccine. One dose is recommended after age 9.  Meningococcal vaccine. You may  need this if you have certain conditions.  Hepatitis A vaccine. You may need this if you have certain conditions or if you travel or work in places where you may be exposed to hepatitis A.  Hepatitis B vaccine. You may need this if you have certain conditions or if you travel or work in places where you may be exposed to hepatitis B.  Haemophilus influenzae type b (Hib) vaccine. You may need this if you have certain conditions.  Talk to your health care provider about which screenings and vaccines you need and how often you need them. This information is not intended to replace advice given to you by your health care provider. Make sure you discuss any questions you have with your health care provider. Document Released: 12/05/2015 Document Revised: 07/28/2016 Document Reviewed: 09/09/2015 Elsevier Interactive Patient Education  [employer].

## 2018-09-22 NOTE — Progress Notes (Addendum)
Patient ID: [name], female    DOB: September 13, 1941  Age: 77 y.o. MRN: [id number]  The patient is here for annual preventive examination and management of other chronic and acute problems.   The risk factors are reflected in the social history.  The roster of all physicians providing medical care to patient - is listed in the Snapshot section of the chart.  Activities of daily living:  The patient is 100% independent in all ADLs: dressing, toileting, feeding as well as independent mobility  Home safety : The patient has smoke detectors in the home. They wear seatbelts.  There are no firearms at home. There is no violence in the home.   There is no risks for hepatitis, STDs or HIV. There is no   history of blood transfusion. They have no travel history to infectious disease endemic areas of the world.  The patient has seen their dentist in the last six month. They have seen their eye doctor in the last year. They admit to slight hearing difficulty with regard to whispered voices and some television programs.  They have deferred audiologic testing in the last year.  They do not  have excessive sun exposure. Discussed the need for sun protection: hats, long sleeves and use of sunscreen if there is significant sun exposure.   Diet: the importance of a healthy diet is discussed. They do have a healthy diet.  The benefits of regular aerobic exercise were discussed. She walks 4 times per week ,  20 minutes.   Depression screen: there are no signs or vegative symptoms of depression- irritability, change in appetite, anhedonia, sadness/tearfullness.  Cognitive assessment: the patient manages all their financial and personal affairs and is actively engaged. They could relate day,date,year and events; recalled 2/3 objects at 3 minutes; performed clock-face test normally.  The following portions of the patient's history were reviewed and updated as appropriate: allergies, current medications, past  family history, past medical history,  past surgical history, past social history  and problem list.  Visual acuity was not assessed per patient preference since she has regular follow up with her ophthalmologist. Hearing and body mass index were assessed and reviewed.   During the course of the visit the patient was educated and counseled about appropriate screening and preventive services including : fall prevention , diabetes screening, nutrition counseling, colorectal cancer screening, and recommended immunizations.    CC: The primary encounter diagnosis was Encounter for preventive health examination. Diagnoses of Acquired hypothyroidism, Prediabetes, Familial hyperlipidemia, and Left carotid bruit were also pertinent to this visit.  follow up on hypothyroid  Prediabetes,  hyperliidemia  Evaluated yesterday by cardiology for chest pain /jaw pain.  Cardiac CT planned  PVC's.  On diltiazem  120 mg  daily .  Limits caffeine 1.6 cups of coffee,  Occasionally symptomatic   History [name] has a past medical history of Arthritis, Hyperlipidemia, Hypothyroidism, and PVC's (premature ventricular contractions).   She has a past surgical history that includes Abdominal hysterectomy; Vaginal prolapse repair (2014); Colonoscopy with propofol (N/A, 12/29/2015); Brow lift (Bilateral, 07/12/2017); and Ptosis repair (Bilateral, 07/12/2017).   Her family history includes Breast cancer in her mother; Cancer in her brother, brother, brother, sister, and sister; Diabetes in her brother, brother, brother, father, and mother; Heart attack in her brother and mother; Pancreatic cancer in her sister; Stroke in her father.She reports that she has quit smoking. Her smoking use included cigarettes. She quit after 1.00 year of use. She has never used smokeless  tobacco. She reports that she does not drink alcohol or use drugs.  Outpatient Medications Prior to Visit  Medication Sig Dispense Refill  . aspirin EC 81 MG  tablet Take 81 mg by mouth daily.      . cholecalciferol (VITAMIN D) 1000 UNITS tablet Take 2,000 Units by mouth daily.     . Cranberry 425 MG CAPS Take by mouth daily.    Marland Kitchen diltiazem (CARDIZEM CD) 120 MG 24 hr capsule TAKE 1 CAPSULE BY MOUTH EVERY DAY 10 capsule 0  . diphenhydramine-acetaminophen (TYLENOL PM) 25-500 MG TABS tablet Take 1 tablet by mouth at bedtime as needed.    Marland Kitchen glucosamine-chondroitin ([employer] GLUCOSAMINE-CHONDROITIN) 500-400 MG tablet Take 1 tablet by mouth daily.     Marland Kitchen levothyroxine (SYNTHROID, LEVOTHROID) 50 MCG tablet TAKE 1 TABLET (50 MCG TOTAL) BY MOUTH DAILY. 90 tablet 1  . metoprolol tartrate (LOPRESSOR) 50 MG tablet Take within two hours of the test 1 tablet 0  . Multiple Vitamins-Minerals (MULTIVITAMIN WITH MINERALS) tablet Take 1 tablet by mouth daily.      . Omega-3 Fatty Acids (FISH OIL) 1000 MG CPDR Take by mouth daily.    . rosuvastatin (CRESTOR) 10 MG tablet Take 1 tablet (10 mg total) by mouth daily. 90 tablet 1   No facility-administered medications prior to visit.     Review of Systems   Patient denies headache, fevers, malaise, unintentional weight loss, skin rash, eye pain, sinus congestion and sinus pain, sore throat, dysphagia,  hemoptysis , cough, dyspnea, wheezing, chest pain, palpitations, orthopnea, edema, abdominal pain, nausea, melena, diarrhea, constipation, flank pain, dysuria, hematuria, urinary  Frequency, nocturia, numbness, tingling, seizures,  Focal weakness, Loss of consciousness,  Tremor, insomnia, depression, anxiety, and suicidal ideation.      Objective:  BP (!) 146/88 (BP Location: Left Arm, Patient Position: Sitting, Cuff Size: Normal)   Pulse (!) 44   Temp 98.1 F (36.7 C) (Oral)   Resp 14   Ht 5\' 7"  (1.702 m)   Wt 148 lb 6.4 oz (67.3 kg)   SpO2 99%   BMI 23.24 kg/m   Physical Exam   General appearance: alert, cooperative and appears stated age Head: Normocephalic, without obvious abnormality, atraumatic Eyes:  conjunctivae/corneas clear. PERRL, EOM's intact. Fundi benign. Ears: normal TM's and external ear canals both ears Nose: Nares normal. Septum midline. Mucosa normal. No drainage or sinus tenderness. Throat: lips, mucosa, and tongue normal; teeth and gums normal Neck: no adenopathy, LEFT SIDED CAROTID BRUIT, no JVD, supple, symmetrical, trachea midline and thyroid not enlarged, symmetric, no tenderness/mass/nodules Lungs: clear to auscultation bilaterally Breasts: normal appearance, no masses or tenderness Heart: regular rate and rhythm, S1, S2 normal, no murmur, click, rub or gallop Abdomen: soft, non-tender; bowel sounds normal; no masses,  no organomegaly Extremities: extremities normal, atraumatic, no cyanosis or edema Pulses: 2+ and symmetric Skin: Skin color, texture, turgor normal. No rashes or lesions Neurologic: Alert and oriented X 3, normal strength and tone. Normal symmetric reflexes. Normal coordination and gait.     Assessment & Plan:   Problem List Items Addressed This Visit    Acquired hypothyroidism   Relevant Orders   TSH   Encounter for preventive health examination - Primary    Annual comprehensive preventive exam was done as well as an evaluation and management of chronic conditions .  During the course of the visit the patient was educated and counseled about appropriate screening and preventive services including :  diabetes screening, lipid analysis with projected  10 year  risk for CAD , nutrition counseling, breast, cervical and colorectal cancer screening, and recommended immunizations.  Printed recommendations for health maintenance screenings was given      Familial hyperlipidemia    Managed with crestor,  LDL is 94 .  lfts normal.  No changes today   Lab Results  Component Value Date   CHOL 161 03/06/2018   HDL 51.20 03/06/2018   LDLCALC 94 09/02/2017   LDLDIRECT 85.0 03/06/2018   TRIG 202.0 (H) 03/06/2018   CHOLHDL 3 03/06/2018   Lab Results   Component Value Date   ALT 21 07/30/2018   AST 26 07/30/2018   ALKPHOS 51 07/30/2018   BILITOT 0.4 07/30/2018          Relevant Medications   telmisartan (MICARDIS) 20 MG tablet   Left carotid bruit    Noted on exam today  Carotid dopplers ordered  She is already taking a statin       Relevant Orders   Ambulatory referral to Vascular Surgery   Prediabetes    Improved a1c with low glycemic index diet.  Low GI index  recommended. Now exercising due to caregiver responsibilities.  Lab Results  Component Value Date   HGBA1C 5.8 09/02/2017         Relevant Orders   Hemoglobin A1c      I am having [name]. Gedeon start on telmisartan and Zoster Vaccine Adjuvanted. I am also having her maintain her aspirin EC, multivitamin with minerals, glucosamine-chondroitin, cholecalciferol, Fish Oil, diphenhydramine-acetaminophen, Cranberry, levothyroxine, rosuvastatin, diltiazem, and metoprolol tartrate.  Meds ordered this encounter  Medications  . telmisartan (MICARDIS) 20 MG tablet    Sig: Take 1 tablet (20 mg total) by mouth daily. In the morning  for blood pressure    Dispense:  30 tablet    Refill:  0  . Zoster Vaccine Adjuvanted [facility]) injection    Sig: Inject 0.5 mLs into the muscle once for 1 dose.    Dispense:  1 each    Refill:  1    There are no discontinued medications.  Follow-up: No follow-ups on file.   [name], MD

## 2018-09-24 NOTE — Assessment & Plan Note (Signed)
Managed with crestor,  LDL is 94 .  lfts normal.  No changes today   Lab Results  Component Value Date   CHOL 161 03/06/2018   HDL 51.20 03/06/2018   LDLCALC 94 09/02/2017   LDLDIRECT 85.0 03/06/2018   TRIG 202.0 (H) 03/06/2018   CHOLHDL 3 03/06/2018   Lab Results  Component Value Date   ALT 21 07/30/2018   AST 26 07/30/2018   ALKPHOS 51 07/30/2018   BILITOT 0.4 07/30/2018

## 2018-09-24 NOTE — Assessment & Plan Note (Addendum)
Improved a1c with low glycemic index diet.  Low GI index  recommended. Now exercising due to caregiver responsibilities.  Lab Results  Component Value Date   HGBA1C 5.8 09/02/2017

## 2018-09-24 NOTE — Assessment & Plan Note (Signed)
Noted on exam today  Carotid dopplers ordered  She is already taking a statin

## 2018-09-24 NOTE — Assessment & Plan Note (Signed)
Annual comprehensive preventive exam was done as well as an evaluation and management of chronic conditions .  During the course of the visit the patient was educated and counseled about appropriate screening and preventive services including :  diabetes screening, lipid analysis with projected  10 year  risk for CAD , nutrition counseling, breast, cervical and colorectal cancer screening, and recommended immunizations.  Printed recommendations for health maintenance screenings was given 

## 2018-09-25 ENCOUNTER — Telehealth: Payer: Self-pay | Admitting: Internal Medicine

## 2018-09-25 ENCOUNTER — Other Ambulatory Visit: Payer: Self-pay | Admitting: Internal Medicine

## 2018-09-25 DIAGNOSIS — R0989 Other specified symptoms and signs involving the circulatory and respiratory systems: Secondary | ICD-10-CM

## 2018-09-25 DIAGNOSIS — Z1231 Encounter for screening mammogram for malignant neoplasm of breast: Secondary | ICD-10-CM

## 2018-09-25 NOTE — Telephone Encounter (Signed)
Copied from [place] [id number]. Topic: General - Other >> Sep 25, 2018  1:44 PM [name] wrote: Reason for CRM: Pt called in, she says she didn't know she was referred to [facility] for vein [facility] vascular. She says they called her to schedule an appointment, but she didn't schedule because she would need [facility] little more information regarding this. Also she is saying she would like to keep her deductible down so she wants to make sure this referral is necessary or is it something that can wait for next year.   Please advise

## 2018-09-25 NOTE — Telephone Encounter (Signed)
I IDENTIFIED A CAROTID BRUIT ON HER LEFT SIDE AND TOLD HER THAT SHE NEEDED Carotid doppler/ultrasound that was best done through [facility]   Does she not recall this discussion at her visit last Friday?

## 2018-09-26 NOTE — Telephone Encounter (Signed)
Spoke with pt and she stated that she had a Carotid US done in 2015 for the same thing. She stated when she used to see Dr. Gilford Rile she would hear the same thing and sent her then. Pt stated that she was told she had a kink in her carotid artery and was told there was nothing they could do about it. Pt is wondering if she needs to have this done again?

## 2018-09-26 NOTE — Assessment & Plan Note (Signed)
Previously evaluated in 2015 ,  No significant stenosis

## 2018-09-26 NOTE — Telephone Encounter (Signed)
Referral has been cancelled.

## 2018-09-26 NOTE — Telephone Encounter (Signed)
I see the prior imaging study.  She does not have to have another one at this time.  I will copy Melissa on this so she can cancel the referral.

## 2018-09-26 NOTE — Telephone Encounter (Signed)
Spoke with pt to let her know that she does not have to have the Carotid US done again due to it being evaluated in 2015 and no significant stenosis was found.

## 2018-09-29 ENCOUNTER — Other Ambulatory Visit (INDEPENDENT_AMBULATORY_CARE_PROVIDER_SITE_OTHER): Payer: Medicare Other

## 2018-09-29 DIAGNOSIS — R7303 Prediabetes: Secondary | ICD-10-CM | POA: Diagnosis not present

## 2018-09-29 DIAGNOSIS — E039 Hypothyroidism, unspecified: Secondary | ICD-10-CM

## 2018-09-29 LAB — TSH: TSH: 2.26 u[IU]/mL (ref 0.35–4.50)

## 2018-09-29 LAB — HEMOGLOBIN A1C: Hgb A1c MFr Bld: 5.9 % (ref 4.6–6.5)

## 2018-10-04 ENCOUNTER — Other Ambulatory Visit: Payer: Self-pay | Admitting: *Deleted

## 2018-10-04 MED ORDER — DILTIAZEM HCL ER COATED BEADS 120 MG PO CP24: ORAL_CAPSULE | ORAL | 10 refills | Status: DC

## 2018-10-14 ENCOUNTER — Other Ambulatory Visit: Payer: Self-pay | Admitting: Internal Medicine

## 2018-10-16 ENCOUNTER — Other Ambulatory Visit (INDEPENDENT_AMBULATORY_CARE_PROVIDER_SITE_OTHER): Payer: Medicare Other

## 2018-10-16 DIAGNOSIS — I10 Essential (primary) hypertension: Secondary | ICD-10-CM

## 2018-10-16 DIAGNOSIS — R072 Precordial pain: Secondary | ICD-10-CM | POA: Diagnosis not present

## 2018-10-17 LAB — BASIC METABOLIC PANEL
BUN/Creatinine Ratio: 15 (ref 12–28)
BUN: 13 mg/dL (ref 8–27)
CO2: 24 mmol/L (ref 20–29)
Calcium: 9.3 mg/dL (ref 8.7–10.3)
Chloride: 100 mmol/L (ref 96–106)
Creatinine, Ser: 0.86 mg/dL (ref 0.57–1.00)
GFR calc Af Amer: 75 mL/min/{1.73_m2} (ref >59)
GFR calc non Af Amer: 65 mL/min/{1.73_m2} (ref >59)
Glucose: 87 mg/dL (ref 65–99)
Potassium: 4.6 mmol/L (ref 3.5–5.2)
Sodium: 139 mmol/L (ref 134–144)

## 2018-10-23 ENCOUNTER — Encounter (HOSPITAL_COMMUNITY): Payer: Self-pay

## 2018-10-23 ENCOUNTER — Ambulatory Visit (HOSPITAL_COMMUNITY): Payer: Medicare Other

## 2018-10-23 ENCOUNTER — Telehealth: Payer: Self-pay | Admitting: Cardiovascular Disease

## 2018-10-23 ENCOUNTER — Ambulatory Visit (HOSPITAL_COMMUNITY)
Admission: RE | Admit: 2018-10-23 | Discharge: 2018-10-23 | Disposition: A | Discharge status: Home / Self Care | Payer: Medicare Other | Source: Ambulatory Visit | Attending: Physician Assistant | Admitting: Physician Assistant

## 2018-10-23 DIAGNOSIS — R072 Precordial pain: Secondary | ICD-10-CM

## 2018-10-23 MED ORDER — NITROGLYCERIN 0.4 MG SL SUBL
0.8000 mg | SUBLINGUAL_TABLET | Freq: Once | SUBLINGUAL | Status: AC
  Administered 2018-10-23: 0.8 mg via SUBLINGUAL
  Filled 2018-10-23: qty 25

## 2018-10-23 MED ORDER — IOHEXOL 300 MG/ML  SOLN
100.0000 mL | Freq: Once | INTRAMUSCULAR | Status: AC | PRN
  Administered 2018-10-23: 100 mL via INTRAVENOUS

## 2018-10-23 MED ORDER — NITROGLYCERIN 0.4 MG SL SUBL
SUBLINGUAL_TABLET | SUBLINGUAL | Status: AC
  Filled 2018-10-23: qty 2

## 2018-10-23 NOTE — Telephone Encounter (Signed)
Servando Snare from [facility] calling States that Dr. Vinnie Langton created an addedem to R. Dunn's last note and wanted to make office aware

## 2018-10-23 NOTE — Telephone Encounter (Signed)
Per [place], Dr. Weber Cooks made an addendum to the cardiac ct by adding some measurements. Message routed to Christell Faith, PA for his knowledge.

## 2018-10-23 NOTE — Telephone Encounter (Signed)
Noted. I have seen the addendum and will await the overread from the cardiologist regarding the cardiac portion of the study. Once I have reviewed that as well I will result the study with recommendations made.

## 2018-10-23 NOTE — Progress Notes (Signed)
Patient tolerated CT without incident. Ate peanut butter crackers and drank water after. Ambulatory steady gait to exit.

## 2018-10-25 ENCOUNTER — Ambulatory Visit
Admission: RE | Admit: 2018-10-25 | Discharge: 2018-10-25 | Disposition: A | Discharge status: Home / Self Care | Payer: Medicare Other | Source: Ambulatory Visit | Attending: Internal Medicine | Admitting: Internal Medicine

## 2018-10-25 DIAGNOSIS — Z1231 Encounter for screening mammogram for malignant neoplasm of breast: Secondary | ICD-10-CM | POA: Insufficient documentation

## 2018-10-27 ENCOUNTER — Telehealth: Payer: Self-pay

## 2018-10-27 DIAGNOSIS — J9859 Other diseases of mediastinum, not elsewhere classified: Secondary | ICD-10-CM

## 2018-10-27 NOTE — Telephone Encounter (Signed)
Call to patient for result review,   Dunn, Ryan M, PA-C  P C[facility] [place] Triage        Cardiac CT showed a calcium score of 0 with normal coronary arteries and a normal sized aortic root. From a cardiac perspective, this is a very reassuring study.    Noncardiac findings included a 1.5 cm along the anterior mediastinum that was favored to represent a mildly enlarged lymph node. However, the possibility of other anterior mediastinal mass was not entirely excluded. The radiologist has recommended a follow-up noncontrast chest CT in 3 to 6 months to reevaluate this finding and to ensure stability or resolution. Please notify the patient of these findings and recommendations. Please order a follow-up noncontrast chest CT and the recommended 3 to 37-month timeframe based on the patient's preference.    Results reviewed verbally with patient.  Order placed for non con chest CT.  Per pt preference CT scheduling called by myself. Date of test 02/07/2019 @ 11 AM in the [place]. Relayed information to pt via call.   Advised pt to call for any further questions or concerns

## 2018-10-27 NOTE — Telephone Encounter (Signed)
-----   Message from Karen Mu, PA-C sent at 10/24/2018  9:22 AM EST ----- Cardiac CT showed a calcium score of 0 with normal coronary arteries and a normal sized aortic root.  From a cardiac perspective, this is a very reassuring study.   Noncardiac findings included a 1.5 cm along the anterior mediastinum that was favored to represent a mildly enlarged lymph node.  However, the possibility of other anterior mediastinal mass was not entirely excluded.  The radiologist has recommended a follow-up noncontrast chest CT in 3 to 6 months to reevaluate this finding and to ensure stability or resolution.  Please notify the patient of these findings and recommendations.  Please order a follow-up noncontrast chest CT and the recommended 3 to 67-month timeframe based on the patient's preference.

## 2018-11-01 ENCOUNTER — Ambulatory Visit (INDEPENDENT_AMBULATORY_CARE_PROVIDER_SITE_OTHER): Payer: Medicare Other | Admitting: Internal Medicine

## 2018-11-01 ENCOUNTER — Encounter: Payer: Self-pay | Admitting: Internal Medicine

## 2018-11-01 VITALS — BP 126/86 | HR 64 | Temp 97.2°F | Ht 67.0 in | Wt 149.0 lb

## 2018-11-01 DIAGNOSIS — N3000 Acute cystitis without hematuria: Secondary | ICD-10-CM

## 2018-11-01 DIAGNOSIS — N390 Urinary tract infection, site not specified: Secondary | ICD-10-CM | POA: Diagnosis not present

## 2018-11-01 MED ORDER — CIPROFLOXACIN HCL 500 MG PO TABS: 500.0000 mg | ORAL_TABLET | Freq: Two times a day (BID) | ORAL | 0 refills | Status: DC

## 2018-11-01 NOTE — Patient Instructions (Addendum)
Cranberry with D Mannose supplement  Increase water intake  If keep getting see urology or urogynecology We referred you to urology Dr. Erlene Quan or [facility] for Frequent UTI   Urinary Tract Infection, Adult A urinary tract infection (UTI) is an infection of any part of the urinary tract, which includes the kidneys, ureters, bladder, and urethra. These organs make, store, and get rid of urine in the body. UTI can be a bladder infection (cystitis) or kidney infection (pyelonephritis). What are the causes? This infection may be caused by fungi, viruses, or bacteria. Bacteria are the most common cause of UTIs. This condition can also be caused by repeated incomplete emptying of the bladder during urination. What increases the risk? This condition is more likely to develop if:  You ignore your need to urinate or hold urine for long periods of time.  You do not empty your bladder completely during urination.  You wipe back to front after urinating or having a bowel movement, if you are female.  You are uncircumcised, if you are female.  You are constipated.  You have a urinary catheter that stays in place (indwelling).  You have a weak defense (immune) system.  You have a medical condition that affects your bowels, kidneys, or bladder.  You have diabetes.  You take antibiotic medicines frequently or for long periods of time, and the antibiotics no longer work well against certain types of infections (antibiotic resistance).  You take medicines that irritate your urinary tract.  You are exposed to chemicals that irritate your urinary tract.  You are female.  What are the signs or symptoms? Symptoms of this condition include:  Fever.  Frequent urination or passing small amounts of urine frequently.  Needing to urinate urgently.  Pain or burning with urination.  Urine that smells bad or unusual.  Cloudy urine.  Pain in the lower abdomen or back.  Trouble urinating.  Blood  in the urine.  Vomiting or being less hungry than normal.  Diarrhea or abdominal pain.  Vaginal discharge, if you are female.  How is this diagnosed? This condition is diagnosed with a medical history and physical exam. You will also need to provide a urine sample to test your urine. Other tests may be done, including:  Blood tests.  Sexually transmitted disease (STD) testing.  If you have had more than one UTI, a cystoscopy or imaging studies may be done to determine the cause of the infections. How is this treated? Treatment for this condition often includes a combination of two or more of the following:  Antibiotic medicine.  Other medicines to treat less common causes of UTI.  Over-the-counter medicines to treat pain.  Drinking enough water to stay hydrated.  Follow these instructions at home:  Take over-the-counter and prescription medicines only as told by your health care provider.  If you were prescribed an antibiotic, take it as told by your health care provider. Do not stop taking the antibiotic even if you start to feel better.  Avoid alcohol, caffeine, tea, and carbonated beverages. They can irritate your bladder.  Drink enough fluid to keep your urine clear or pale yellow.  Keep all follow-up visits as told by your health care provider. This is important.  Make sure to: ? Empty your bladder often and completely. Do not hold urine for long periods of time. ? Empty your bladder before and after sex. ? Wipe from front to back after a bowel movement if you are female. Use each tissue one  time when you wipe. Contact a health care provider if:  You have back pain.  You have a fever.  You feel nauseous or vomit.  Your symptoms do not get better after 3 days.  Your symptoms go away and then return. Get help right away if:  You have severe back pain or lower abdominal pain.  You are vomiting and cannot keep down any medicines or water. This information  is not intended to replace advice given to you by your health care provider. Make sure you discuss any questions you have with your health care provider. Document Released: 08/18/2005 Document Revised: 04/21/2016 Document Reviewed: 09/29/2015 Elsevier Interactive Patient Education  [employer].

## 2018-11-01 NOTE — Progress Notes (Signed)
Pre visit review using our clinic review tool, if applicable. No additional management support is needed unless otherwise documented below in the visit note. 

## 2018-11-01 NOTE — Progress Notes (Signed)
Chief Complaint  Patient presents with  . Urinary Tract Infection   F/u  1. Frequent urination since Sunday and abdominal pressure and shaking as well intermittently. Tried Cranberry juice otc w/o help 07/2018 had E coli UTI had macrobid and has had frequent UTIS since 2016 and she had them in her 20s. She does not wear pads/diapers.    Review of Systems  Constitutional: Positive for chills. Negative for fever.  HENT: Negative for hearing loss.   Eyes: Negative for blurred vision.  Respiratory: Negative for shortness of breath.   Cardiovascular: Negative for chest pain.  Gastrointestinal: Positive for abdominal pain.       Abdominal soreness    Genitourinary: Positive for frequency. Negative for dysuria.  Skin: Negative for rash.   Past Medical History:  Diagnosis Date  . Arthritis    knees  . Hyperlipidemia   . Hypothyroidism   . PVC's (premature ventricular contractions)    a.  48-hour Holter in 3/17 demonstrated NSR, frequent PVCs totaling 5300 beats representing 3% burden, occasional PACs, short runs of SVT   Past Surgical History:  Procedure Laterality Date  . ABDOMINAL HYSTERECTOMY     In 30's  . BROW LIFT Bilateral 07/12/2017   Procedure: BLEPHAROPLASTY upper eyelid with excess skin;  Surgeon: Fowler, Amy M, MD;  Location: [facility];  Service: Ophthalmology;  Laterality: Bilateral;  MAC  . COLONOSCOPY WITH PROPOFOL N/A 12/29/2015   Procedure: COLONOSCOPY WITH PROPOFOL;  Surgeon: Paul Y Oh, MD;  Location: [facility];  Service: Gastroenterology;  Laterality: N/A;  . PTOSIS REPAIR Bilateral 07/12/2017   Procedure: ptosis repair resect ex;  Surgeon: Fowler, Amy M, MD;  Location: [facility];  Service: Ophthalmology;  Laterality: Bilateral;  Requests early  . VAGINAL PROLAPSE REPAIR  2014   Dr. [name]   Family History  Problem Relation Age of Onset  . Breast cancer Mother        60 's  . Heart attack Mother   . Diabetes Mother   . Stroke Father    . Diabetes Father   . Pancreatic cancer Sister   . Cancer Sister        pancreatic  . Cancer Brother        liver  . Diabetes Brother   . Cancer Sister        anal  . Heart attack Brother   . Diabetes Brother   . Cancer Brother        stomach  . Cancer Brother   . Diabetes Brother   . Colon cancer Neg Hx    Social History   Socioeconomic History  . Marital status: Married    Spouse name: Not on file  . Number of children: 2  . Years of education: Not on file  . Highest education level: Not on file  Occupational History  . Occupation: Retired Education officer, museum  Social Needs  . Financial resource strain: Not hard at all  . Food insecurity:    Worry: Never true    Inability: Never true  . Transportation needs:    Medical: No    Non-medical: No  Tobacco Use  . Smoking status: Former Smoker    Years: 1.00    Types: Cigarettes  . Smokeless tobacco: Never Used  . Tobacco comment: Smoked x 1 year in her 72's  Substance and Sexual Activity  . Alcohol use: No  . Drug use: No  . Sexual activity: Never  Lifestyle  . Physical activity:  Days per week: Not on file    Minutes per session: Not on file  . Stress: Not on file  Relationships  . Social connections:    Talks on phone: Not on file    Gets together: Not on file    Attends religious service: Not on file    Active member of club or organization: Not on file    Attends meetings of clubs or organizations: Not on file    Relationship status: Not on file  . Intimate partner violence:    Fear of current or ex partner: No    Emotionally abused: No    Physically abused: No    Forced sexual activity: No  Other Topics Concern  . Not on file  Social History Narrative   Daily Caffeine Use:  One cup coffee in am    Regular Exercise -  5 days a week x 1 hour - gym, aerobic and wt training         Current Meds  Medication Sig  . aspirin EC 81 MG tablet Take 81 mg by mouth daily.    . cholecalciferol (VITAMIN D)  1000 UNITS tablet Take 2,000 Units by mouth daily.   . Cranberry 425 MG CAPS Take by mouth daily.  Marland Kitchen diltiazem (CARDIZEM CD) 120 MG 24 hr capsule TAKE 1 CAPSULE BY MOUTH EVERY DAY  . diphenhydramine-acetaminophen (TYLENOL PM) 25-500 MG TABS tablet Take 1 tablet by mouth at bedtime as needed.  Marland Kitchen glucosamine-chondroitin ([employer] GLUCOSAMINE-CHONDROITIN) 500-400 MG tablet Take 1 tablet by mouth daily.   Marland Kitchen levothyroxine (SYNTHROID, LEVOTHROID) 50 MCG tablet TAKE 1 TABLET (50 MCG TOTAL) BY MOUTH DAILY.  . metoprolol tartrate (LOPRESSOR) 50 MG tablet Take within two hours of the test  . Multiple Vitamins-Minerals (MULTIVITAMIN WITH MINERALS) tablet Take 1 tablet by mouth daily.    . Omega-3 Fatty Acids (FISH OIL) 1000 MG CPDR Take by mouth daily.  . rosuvastatin (CRESTOR) 10 MG tablet Take 1 tablet (10 mg total) by mouth daily.  Marland Kitchen telmisartan (MICARDIS) 20 MG tablet TAKE 1 TABLET (20 MG TOTAL) BY MOUTH DAILY. IN THE MORNING FOR BLOOD PRESSURE   Allergies  Allergen Reactions  . Prednisone     "crazy"   Recent Results (from the past 2160 hour(s))  Hemoglobin A1c     Status: None   Collection Time: 09/29/18  1:38 PM  Result Value Ref Range   Hgb A1c MFr Bld 5.9 4.6 - 6.5 %    Comment: Glycemic Control Guidelines for People with Diabetes:Non Diabetic:  <6%Goal of Therapy: <7%Additional Action Suggested:  >8%   TSH     Status: None   Collection Time: 09/29/18  1:38 PM  Result Value Ref Range   TSH 2.26 0.35 - 4.50 uIU/mL  Basic metabolic panel     Status: None   Collection Time: 10/16/18  9:22 AM  Result Value Ref Range   Glucose 87 65 - 99 mg/dL   BUN 13 8 - 27 mg/dL   Creatinine, Ser 0.86 0.57 - 1.00 mg/dL   GFR calc non Af Amer 65 >59 mL/min/1.73   GFR calc Af Amer 75 >59 mL/min/1.73   BUN/Creatinine Ratio 15 12 - 28   Sodium 139 134 - 144 mmol/L   Potassium 4.6 3.5 - 5.2 mmol/L   Chloride 100 96 - 106 mmol/L   CO2 24 20 - 29 mmol/L   Calcium 9.3 8.7 - 10.3 mg/dL   Objective  Body mass  index is 23.34 kg/m.  Wt Readings from Last 3 Encounters:  11/01/18 149 lb (67.6 kg)  09/22/18 148 lb 6.4 oz (67.3 kg)  09/21/18 147 lb 4 oz (66.8 kg)   Temp Readings from Last 3 Encounters:  11/01/18 (!) 97.2 F (36.2 C) (Oral)  09/22/18 98.1 F (36.7 C) (Oral)  07/31/18 (!) 97.3 F (36.3 C) (Oral)   BP Readings from Last 3 Encounters:  11/01/18 126/86  10/23/18 127/73  09/22/18 (!) 146/88   Pulse Readings from Last 3 Encounters:  11/01/18 64  10/23/18 (!) 58  09/22/18 (!) 44    Physical Exam  Constitutional: She is oriented to person, place, and time. Vital signs are normal. She appears well-developed and well-nourished. She is cooperative.  HENT:  Head: Normocephalic and atraumatic.  Mouth/Throat: Oropharynx is clear and moist and mucous membranes are normal.  Eyes: Pupils are equal, round, and reactive to light. Conjunctivae are normal.  Cardiovascular: Normal rate, regular rhythm and normal heart sounds.  Pulmonary/Chest: Effort normal and breath sounds normal.  Abdominal: There is tenderness in the suprapubic area. There is no CVA tenderness.  Mild ttp suprapubic    Neurological: She is alert and oriented to person, place, and time. Gait normal.  Skin: Skin is warm, dry and intact.  Psychiatric: She has a normal mood and affect. Her speech is normal and behavior is normal. Judgment and thought content normal. Cognition and memory are normal.  Nursing note and vitals reviewed.   Assessment   1. UTI, recurrent Plan  1. UA, culture  Refer to urology  Disc cranberry with D mannose otc suppment cipro 500 mg bid  F/u with PCP in 3 months   Provider: Dr. Olivia Mackie [facility]

## 2018-11-02 LAB — URINALYSIS, ROUTINE W REFLEX MICROSCOPIC
Bilirubin Urine: NEGATIVE
Glucose, UA: NEGATIVE
Hgb urine dipstick: NEGATIVE
Hyaline Cast: NONE SEEN /LPF
Ketones, ur: NEGATIVE
Nitrite: NEGATIVE
Protein, ur: NEGATIVE
RBC / HPF: NONE SEEN /HPF (ref 0–2)
Specific Gravity, Urine: 1.006 (ref 1.001–1.03)
pH: 7 (ref 5.0–8.0)

## 2018-11-02 LAB — URINE CULTURE
MICRO NUMBER:: 91483984
SPECIMEN QUALITY:: ADEQUATE

## 2018-11-11 ENCOUNTER — Other Ambulatory Visit: Payer: Self-pay | Admitting: Internal Medicine

## 2018-12-10 ENCOUNTER — Other Ambulatory Visit: Payer: Self-pay | Admitting: Internal Medicine

## 2018-12-15 ENCOUNTER — Ambulatory Visit: Payer: Self-pay | Admitting: Urology

## 2019-01-10 DIAGNOSIS — S8265XA Nondisplaced fracture of lateral malleolus of left fibula, initial encounter for closed fracture: Secondary | ICD-10-CM | POA: Diagnosis not present

## 2019-01-10 DIAGNOSIS — M13862 Other specified arthritis, left knee: Secondary | ICD-10-CM | POA: Diagnosis not present

## 2019-01-31 DIAGNOSIS — M179 Osteoarthritis of knee, unspecified: Secondary | ICD-10-CM | POA: Diagnosis not present

## 2019-02-05 ENCOUNTER — Other Ambulatory Visit: Payer: Self-pay

## 2019-02-05 ENCOUNTER — Ambulatory Visit (INDEPENDENT_AMBULATORY_CARE_PROVIDER_SITE_OTHER): Payer: Medicare Other | Admitting: Internal Medicine

## 2019-02-05 ENCOUNTER — Encounter: Payer: Self-pay | Admitting: Internal Medicine

## 2019-02-05 VITALS — BP 142/80 | HR 52 | Temp 98.1°F | Resp 14 | Ht 65.75 in | Wt 146.0 lb

## 2019-02-05 DIAGNOSIS — R7303 Prediabetes: Secondary | ICD-10-CM

## 2019-02-05 DIAGNOSIS — E039 Hypothyroidism, unspecified: Secondary | ICD-10-CM | POA: Diagnosis not present

## 2019-02-05 DIAGNOSIS — I10 Essential (primary) hypertension: Secondary | ICD-10-CM | POA: Diagnosis not present

## 2019-02-05 DIAGNOSIS — E7849 Other hyperlipidemia: Secondary | ICD-10-CM | POA: Diagnosis not present

## 2019-02-05 DIAGNOSIS — R300 Dysuria: Secondary | ICD-10-CM

## 2019-02-05 LAB — LIPID PANEL
Cholesterol: 177 mg/dL (ref 0–200)
HDL: 62.4 mg/dL (ref >39.00)
LDL Cholesterol: 86 mg/dL (ref 0–99)
NonHDL: 114.52
Total CHOL/HDL Ratio: 3
Triglycerides: 145 mg/dL (ref 0.0–149.0)
VLDL: 29 mg/dL (ref 0.0–40.0)

## 2019-02-05 LAB — COMPREHENSIVE METABOLIC PANEL
ALT: 22 U/L (ref 0–35)
AST: 22 U/L (ref 0–37)
Albumin: 4.1 g/dL (ref 3.5–5.2)
Alkaline Phosphatase: 38 U/L — ABNORMAL LOW (ref 39–117)
BUN: 18 mg/dL (ref 6–23)
CO2: 29 mEq/L (ref 19–32)
Calcium: 9.6 mg/dL (ref 8.4–10.5)
Chloride: 102 mEq/L (ref 96–112)
Creatinine, Ser: 0.91 mg/dL (ref 0.40–1.20)
GFR: 59.83 mL/min — ABNORMAL LOW (ref >60.00)
Glucose, Bld: 92 mg/dL (ref 70–99)
Potassium: 4.7 mEq/L (ref 3.5–5.1)
Sodium: 138 mEq/L (ref 135–145)
Total Bilirubin: 0.3 mg/dL (ref 0.2–1.2)
Total Protein: 7.4 g/dL (ref 6.0–8.3)

## 2019-02-05 LAB — MICROALBUMIN / CREATININE URINE RATIO
Creatinine,U: 36 mg/dL
Microalb Creat Ratio: 2.9 mg/g (ref 0.0–30.0)
Microalb, Ur: 1.1 mg/dL (ref 0.0–1.9)

## 2019-02-05 LAB — TSH: TSH: 1.78 u[IU]/mL (ref 0.35–4.50)

## 2019-02-05 LAB — HEMOGLOBIN A1C: Hgb A1c MFr Bld: 6.1 % (ref 4.6–6.5)

## 2019-02-05 NOTE — Patient Instructions (Signed)
There was  no evidence of urinary tract infection by your December culture results.  Your symptoms may be due to "atrophic vaginitis" .  If your next episode is also negative for infection,  I recommend treating you with vaginal estrogen that can be applied to your urethra and surrounding area as well as inside the vagina.   optimal blood pressure management is below 130/80   Please check your blood pressure a few times at home and bring the readings AND THE MACHINE  to the office so the nurse can see how accurate your machine is.    Atrophic Vaginitis  Atrophic vaginitis is a condition in which the tissues that line the vagina become dry and thin. This condition is most common in women who have stopped having regular menstrual periods (are in menopause). This usually starts when a woman is 69-29 years old. That is the time when a woman's estrogen levels begin to drop (decrease). Estrogen is a female hormone. It helps to keep the tissues of the vagina moist. It stimulates the vagina to produce a clear fluid that lubricates the vagina for sexual intercourse. This fluid also protects the vagina from infection. Lack of estrogen can cause the lining of the vagina to get thinner and dryer. The vagina may also shrink in size. It may become less elastic. Atrophic vaginitis tends to get worse over time as a woman's estrogen level drops. What are the causes? This condition is caused by the normal drop in estrogen that happens around the time of menopause. What increases the risk? Certain conditions or situations may lower a woman's estrogen level, leading to a higher risk for atrophic vaginitis. You are more likely to develop this condition if:  You are taking medicines that block estrogen.  You have had your ovaries removed.  You are being treated for cancer with X-ray (radiation) or medicines (chemotherapy).  You have given birth or are breastfeeding.  You are older than age 31.  You smoke. What  are the signs or symptoms? Symptoms of this condition include:  Pain, soreness, or bleeding during sexual intercourse (dyspareunia).  Vaginal burning, irritation, or itching.  Pain or bleeding when a speculum is used in a vaginal exam (pelvic exam).  Having burning pain when passing urine.  Vaginal discharge that is brown or yellow. In some cases, there are no symptoms. How is this diagnosed? This condition is diagnosed by taking a medical history and doing a physical exam. This will include a pelvic exam that checks the vaginal tissues. Though rare, you may also have other tests, including:  A urine test.  A test that checks the acid balance in your vagina (acid balance test). How is this treated? Treatment for this condition depends on how severe your symptoms are. Treatment may include:  Using an over-the-counter vaginal lubricant before sex.  Using a long-acting vaginal moisturizer.  Using low-dose vaginal estrogen for moderate to severe symptoms that do not respond to other treatments. Options include creams, tablets, and inserts (vaginal rings). Before you use a vaginal estrogen, tell your health care provider if you have a history of: ? Breast cancer. ? Endometrial cancer. ? Blood clots. If you are not sexually active and your symptoms are very mild, you may not need treatment. Follow these instructions at home: Medicines  Take over-the-counter and prescription medicines only as told by your health care provider. Do not use herbal or alternative medicines unless your health care provider says that you can.  Use over-the-counter  creams, lubricants, or moisturizers for dryness only as directed by your health care provider. General instructions  If your atrophic vaginitis is caused by menopause, discuss all of your menopause symptoms and treatment options with your health care provider.  Do not douche.  Do not use products that can make your vagina dry. These include:  ? Scented feminine sprays. ? Scented tampons. ? Scented soaps.  Vaginal intercourse can help to improve blood flow and elasticity of vaginal tissue. If it hurts to have sex, try using a lubricant or moisturizer just before having intercourse. Contact a health care provider if:  Your discharge looks different than normal.  Your vagina has an unusual smell.  You have new symptoms.  Your symptoms do not improve with treatment.  Your symptoms get worse. Summary  Atrophic vaginitis is a condition in which the tissues that line the vagina become dry and thin. It is most common in women who have stopped having regular menstrual periods (are in menopause).  Treatment options include using vaginal lubricants and low-dose vaginal estrogen.  Contact a health care provider if your vagina has an unusual smell, or if your symptoms get worse or do not improve after treatment. This information is not intended to replace advice given to you by your health care provider. Make sure you discuss any questions you have with your health care provider. Document Released: 03/25/2015 Document Revised: 08/04/2017 Document Reviewed: 08/04/2017 Elsevier Interactive Patient Education  2019 [employer].

## 2019-02-05 NOTE — Progress Notes (Signed)
Subjective:  Patient ID: [name], female    DOB: 10-26-1941  Age: 78 y.o. MRN: [id number]  CC: The primary encounter diagnosis was Dysuria. Diagnoses of Prediabetes, Familial hyperlipidemia, Acquired hypothyroidism, and Essential hypertension were also pertinent to this visit.  HPI [name] presents for follow up on prediabetes,  Elevated blood pressure , hyperlipidemia and hypothyroidism   Not fasting today . Eats breakfast regularly,  Cares for husband who  Has dementia and is debilitated.  Had cheerios and granola ,  Shredded wheat with frosting.  Exercising regularly at local gyms .lunch is light (1/2 sandwhich or salad or greek yogurt) Dinner is meat / Vegetables.  Lost 3 lbs since last visit , she attributes the weigh loss to exercising more  HTN: Patient is taking her medications as prescribed and notes no adverse effects.  Home BP readings have been done about once per week and are  generally < 130/80 .  She is avoiding added salt in her diet and walking regularly about 3 times per week for exercise  .  Low T4: feels cold all the time  Dysuria :  Treated in December, urine culture was negative.    Coronary calcium score zero,,  Repeat CT this week for lymph node enlargement    Outpatient Medications Prior to Visit  Medication Sig Dispense Refill  . aspirin EC 81 MG tablet Take 81 mg by mouth daily.      . cholecalciferol (VITAMIN D) 1000 UNITS tablet Take 2,000 Units by mouth daily.     . Cranberry 425 MG CAPS Take by mouth daily.    Marland Kitchen diltiazem (CARDIZEM CD) 120 MG 24 hr capsule TAKE 1 CAPSULE BY MOUTH EVERY DAY 30 capsule 10  . diphenhydramine-acetaminophen (TYLENOL PM) 25-500 MG TABS tablet Take 1 tablet by mouth at bedtime as needed.    Marland Kitchen glucosamine-chondroitin ([employer] GLUCOSAMINE-CHONDROITIN) 500-400 MG tablet Take 1 tablet by mouth daily.     Marland Kitchen levothyroxine (SYNTHROID, LEVOTHROID) 50 MCG tablet TAKE 1 TABLET (50 MCG TOTAL) BY MOUTH DAILY. 90 tablet 1  .  Multiple Vitamins-Minerals (MULTIVITAMIN WITH MINERALS) tablet Take 1 tablet by mouth daily.      . Omega-3 Fatty Acids (FISH OIL) 1000 MG CPDR Take by mouth daily.    . rosuvastatin (CRESTOR) 10 MG tablet Take 1 tablet (10 mg total) by mouth daily. 90 tablet 1  . telmisartan (MICARDIS) 20 MG tablet TAKE 1 TABLET (20 MG TOTAL) BY MOUTH DAILY. IN THE MORNING FOR BLOOD PRESSURE 90 tablet 1  . ciprofloxacin (CIPRO) 500 MG tablet Take 1 tablet (500 mg total) by mouth 2 (two) times daily. With food (Patient not taking: Reported on 02/05/2019) 10 tablet 0  . metoprolol tartrate (LOPRESSOR) 50 MG tablet Take within two hours of the test (Patient not taking: Reported on 02/05/2019) 1 tablet 0   No facility-administered medications prior to visit.     Review of Systems;  Patient denies headache, fevers, malaise, unintentional weight loss, skin rash, eye pain, sinus congestion and sinus pain, sore throat, dysphagia,  hemoptysis , cough, dyspnea, wheezing, chest pain, palpitations, orthopnea, edema, abdominal pain, nausea, melena, diarrhea, constipation, flank pain, dysuria, hematuria, urinary  Frequency, nocturia, numbness, tingling, seizures,  Focal weakness, Loss of consciousness,  Tremor, insomnia, depression, anxiety, and suicidal ideation.      Objective:  BP (!) 142/80 (BP Location: Left Arm, Patient Position: Sitting, Cuff Size: Normal)   Pulse (!) 52   Temp 98.1 F (36.7 C) (Oral)  Resp 14   Ht 5' 5.75" (1.67 m)   Wt 146 lb (66.2 kg)   SpO2 98%   BMI 23.74 kg/m   BP Readings from Last 3 Encounters:  02/05/19 (!) 142/80  11/01/18 126/86  10/23/18 127/73    Wt Readings from Last 3 Encounters:  02/05/19 146 lb (66.2 kg)  11/01/18 149 lb (67.6 kg)  09/22/18 148 lb 6.4 oz (67.3 kg)    General appearance: alert, cooperative and appears stated age Ears: normal TM's and external ear canals both ears Throat: lips, mucosa, and tongue normal; teeth and gums normal Neck: no adenopathy, no  carotid bruit, supple, symmetrical, trachea midline and thyroid not enlarged, symmetric, no tenderness/mass/nodules Back: symmetric, no curvature. ROM normal. No CVA tenderness. Lungs: clear to auscultation bilaterally Heart: regular rate and rhythm, S1, S2 normal, no murmur, click, rub or gallop Abdomen: soft, non-tender; bowel sounds normal; no masses,  no organomegaly Pulses: 2+ and symmetric Skin: Skin color, texture, turgor normal. No rashes or lesions Lymph nodes: Cervical, supraclavicular, and axillary nodes normal.  Lab Results  Component Value Date   HGBA1C 6.1 02/05/2019   HGBA1C 5.9 09/29/2018   HGBA1C 5.8 09/02/2017    Lab Results  Component Value Date   CREATININE 0.91 02/05/2019   CREATININE 0.86 10/16/2018   CREATININE 0.83 07/30/2018    Lab Results  Component Value Date   WBC 10.3 07/30/2018   HGB 13.2 07/30/2018   HCT 39.0 07/30/2018   PLT 244 07/30/2018   GLUCOSE 92 02/05/2019   CHOL 177 02/05/2019   TRIG 145.0 02/05/2019   HDL 62.40 02/05/2019   LDLDIRECT 85.0 03/06/2018   LDLCALC 86 02/05/2019   ALT 22 02/05/2019   AST 22 02/05/2019   NA 138 02/05/2019   K 4.7 02/05/2019   CL 102 02/05/2019   CREATININE 0.91 02/05/2019   BUN 18 02/05/2019   CO2 29 02/05/2019   TSH 1.78 02/05/2019   HGBA1C 6.1 02/05/2019   MICROALBUR 1.1 02/05/2019    Mm 3d Screen Breast Bilateral  Result Date: 10/25/2018 CLINICAL DATA:  Screening. EXAM: DIGITAL SCREENING BILATERAL MAMMOGRAM WITH TOMO AND CAD COMPARISON:  Previous exam(s). ACR Breast Density Category b: There are scattered areas of fibroglandular density. FINDINGS: There are no findings suspicious for malignancy. Images were processed with CAD. IMPRESSION: No mammographic evidence of malignancy. A result letter of this screening mammogram will be mailed directly to the patient. RECOMMENDATION: Screening mammogram in one year. (Code:SM-B-01Y) BI-RADS CATEGORY  1: Negative. Electronically Signed   By: Lillia Mountain  M.D.   On: 10/25/2018 12:51    Assessment & Plan:   Problem List Items Addressed This Visit    Prediabetes    Her  a1c  Is rising despite exercise.  Encouraging adherence to low glycemic index diet.    Lab Results  Component Value Date   HGBA1C 6.1 02/05/2019         Relevant Orders   Hemoglobin A1c (Completed)   Familial hyperlipidemia    Managed with crestor,  LDL is 85 .  lfts normal.  No changes today   Lab Results  Component Value Date   CHOL 177 02/05/2019   HDL 62.40 02/05/2019   LDLCALC 86 02/05/2019   LDLDIRECT 85.0 03/06/2018   TRIG 145.0 02/05/2019   CHOLHDL 3 02/05/2019   Lab Results  Component Value Date   ALT 22 02/05/2019   AST 22 02/05/2019   ALKPHOS 38 (L) 02/05/2019   BILITOT 0.3 02/05/2019  Relevant Orders   Lipid panel (Completed)   Acquired hypothyroidism    Thyroid function is WNL on current dose.  No current changes needed.   Lab Results  Component Value Date   TSH 1.78 02/05/2019         Relevant Orders   TSH (Completed)    Other Visit Diagnoses    Dysuria    -  Primary   Relevant Orders   POCT urinalysis dipstick   Urine Microscopic Only   Urine Culture   Urine Microalbumin w/creat. ratio (Completed)   Essential hypertension       Relevant Orders   Comprehensive metabolic panel (Completed)      I have discontinued Meckenzie Balsley. Monty's metoprolol tartrate and ciprofloxacin. I am also having her maintain her aspirin EC, multivitamin with minerals, glucosamine-chondroitin, cholecalciferol, Fish Oil, diphenhydramine-acetaminophen, Cranberry, levothyroxine, rosuvastatin, diltiazem, and telmisartan.  No orders of the defined types were placed in this encounter.   Medications Discontinued During This Encounter  Medication Reason  . ciprofloxacin (CIPRO) 500 MG tablet Completed Course  . metoprolol tartrate (LOPRESSOR) 50 MG tablet Patient has not taken in last 30 days    Follow-up: Return in about 6 months  (around 08/08/2019).   [name], MD

## 2019-02-06 NOTE — Assessment & Plan Note (Signed)
Suggested by history of blue fingers occurring with cold exposure without pain. Distal pulses and cap refill are normal  exam today .  No s&s of scleroderma

## 2019-02-06 NOTE — Assessment & Plan Note (Signed)
Her  a1c  Is rising despite exercise.  Encouraging adherence to low glycemic index diet.    Lab Results  Component Value Date   HGBA1C 6.1 02/05/2019

## 2019-02-06 NOTE — Assessment & Plan Note (Signed)
Managed with crestor,  LDL is 85 .  lfts normal.  No changes today   Lab Results  Component Value Date   CHOL 177 02/05/2019   HDL 62.40 02/05/2019   LDLCALC 86 02/05/2019   LDLDIRECT 85.0 03/06/2018   TRIG 145.0 02/05/2019   CHOLHDL 3 02/05/2019   Lab Results  Component Value Date   ALT 22 02/05/2019   AST 22 02/05/2019   ALKPHOS 38 (L) 02/05/2019   BILITOT 0.3 02/05/2019

## 2019-02-06 NOTE — Assessment & Plan Note (Signed)
Thyroid function is WNL on current dose.  No current changes needed.   Lab Results  Component Value Date   TSH 1.78 02/05/2019

## 2019-02-07 ENCOUNTER — Other Ambulatory Visit: Payer: Self-pay

## 2019-02-07 ENCOUNTER — Ambulatory Visit
Admission: RE | Admit: 2019-02-07 | Discharge: 2019-02-07 | Disposition: A | Discharge status: Home / Self Care | Payer: Medicare Other | Source: Ambulatory Visit | Attending: Physician Assistant | Admitting: Physician Assistant

## 2019-02-07 DIAGNOSIS — J9859 Other diseases of mediastinum, not elsewhere classified: Secondary | ICD-10-CM | POA: Diagnosis not present

## 2019-02-07 DIAGNOSIS — R222 Localized swelling, mass and lump, trunk: Secondary | ICD-10-CM | POA: Diagnosis not present

## 2019-02-08 ENCOUNTER — Telehealth: Payer: Self-pay | Admitting: *Deleted

## 2019-02-08 NOTE — Telephone Encounter (Signed)
-----   Message from Rise Mu, PA-C sent at 02/07/2019  3:57 PM EDT ----- Unchanged 2.0 x 1.8 cm anterior mediastinal mass or lymph node. Radiology has favored this to have a benign appearance, additional follow up CT is recommended in 6-9 months to establish a longer period of stability. Otherwise, no new intrathoracic abnormality with again noted aortic atherosclerosis.   Either I can order or if she wants, she can see if her PCP wants to follow up with the above recommended repeat scan.

## 2019-02-08 NOTE — Telephone Encounter (Signed)
Results called to pt. Pt verbalized understanding. She will ask Dr Derrel Nip or Dr Rockey Situ about repeating the test at next appointments later this year.

## 2019-02-17 ENCOUNTER — Other Ambulatory Visit: Payer: Self-pay | Admitting: Internal Medicine

## 2019-06-09 ENCOUNTER — Other Ambulatory Visit: Payer: Self-pay | Admitting: Internal Medicine

## 2019-08-01 ENCOUNTER — Ambulatory Visit: Payer: Medicare Other | Admitting: Internal Medicine

## 2019-08-01 ENCOUNTER — Ambulatory Visit: Payer: Medicare Other

## 2019-08-02 ENCOUNTER — Telehealth: Payer: Self-pay

## 2019-08-02 ENCOUNTER — Ambulatory Visit: Payer: Medicare Other

## 2019-08-02 NOTE — Telephone Encounter (Signed)
No answer, no voicemail when I called for scheduled annual wellness visit and health maintenance updates. If no call back to office during time allotted visit, please reschedule as appropriate.

## 2019-08-06 ENCOUNTER — Telehealth: Payer: Self-pay | Admitting: Internal Medicine

## 2019-08-06 NOTE — Telephone Encounter (Signed)
See previous note. Called patient on day of appointment. No answer. No voicemail.  Called patient back today. Rescheduled appointment.

## 2019-08-06 NOTE — Telephone Encounter (Signed)
Patient is calling for Denesa regarding her 08/02/19 appt. Patient states she waited by the phone and never had a telephone visit. Then she saw on MyChart that she had a no Show.  Patient is wanting to know why. She waited by the telephone. And never had a call. Please advise CB- [phone]

## 2019-08-07 ENCOUNTER — Other Ambulatory Visit: Payer: Self-pay | Admitting: Physician Assistant

## 2019-08-07 ENCOUNTER — Other Ambulatory Visit: Payer: Self-pay | Admitting: Internal Medicine

## 2019-08-10 ENCOUNTER — Ambulatory Visit (INDEPENDENT_AMBULATORY_CARE_PROVIDER_SITE_OTHER): Payer: Medicare Other

## 2019-08-10 ENCOUNTER — Other Ambulatory Visit: Payer: Self-pay

## 2019-08-10 DIAGNOSIS — Z0000 Encounter for general adult medical examination without abnormal findings: Secondary | ICD-10-CM

## 2019-08-10 NOTE — Progress Notes (Signed)
Subjective:   [name] is a 78 y.o. female who presents for Medicare Annual (Subsequent) preventive examination.  Review of Systems:  No ROS.  Medicare Wellness Virtual Visit.  Visual/audio telehealth visit, UTA vital signs.   See social history for additional risk factors.   Cardiac Risk Factors include: advanced age (>33men, >33 women)     Objective:     Vitals: There were no vitals taken for this visit.  There is no height or weight on file to calculate BMI.  Advanced Directives 08/10/2019 07/31/2018 07/30/2018 07/29/2017 07/12/2017 09/29/2016 07/27/2016  Does Patient Have a Medical Advance Directive? No No No No No Yes Yes  Type of Advance Directive - - - - - Living will Healthcare Power of [place];Living will  Does patient want to make changes to medical advance directive? No - Patient declined Yes (MAU/Ambulatory/Procedural Areas - Information given) - - - - -  Copy of Healthcare Power of Attorney in Chart? - - - - - - No - copy requested  Would patient like information on creating a medical advance directive? - - - No - Patient declined No - Patient declined - -    Tobacco Social History   Tobacco Use  Smoking Status Former Smoker  . Years: 1.00  . Types: Cigarettes  Smokeless Tobacco Never Used  Tobacco Comment   Smoked x 1 year in her 62's     Counseling given: Not Answered Comment: Smoked x 1 year in her 20's   Clinical Intake:  Pre-visit preparation completed: Yes        Diabetes: No  How often do you need to have someone help you when you read instructions, pamphlets, or other written materials from your doctor or pharmacy?: 1 - Never  Interpreter Needed?: No     Past Medical History:  Diagnosis Date  . Arthritis    knees  . Hyperlipidemia   . Hypothyroidism   . PVC's (premature ventricular contractions)    a.  48-hour Holter in 3/17 demonstrated NSR, frequent PVCs totaling 5300 beats representing 3% burden, occasional PACs, short runs of  SVT   Past Surgical History:  Procedure Laterality Date  . ABDOMINAL HYSTERECTOMY     In 30's  . BROW LIFT Bilateral 07/12/2017   Procedure: BLEPHAROPLASTY upper eyelid with excess skin;  Surgeon: Imagene Riches, MD;  Location: [facility];  Service: Ophthalmology;  Laterality: Bilateral;  MAC  . COLONOSCOPY WITH PROPOFOL N/A 12/29/2015   Procedure: COLONOSCOPY WITH PROPOFOL;  Surgeon: Wallace Cullens, MD;  Location[facility];  Service: Gastroenterology;  Laterality: N/A;  . PTOSIS REPAIR Bilateral 07/12/2017   Procedure: ptosis repair resect ex;  Surgeon: Imagene Riches, MD;  Location: [facility];  Service: Ophthalmology;  Laterality: Bilateral;  Requests early  . VAGINAL PROLAPSE REPAIR  2014   Dr. Greggory Keen   Family History  Problem Relation Age of Onset  . Breast cancer Mother        32's  . Heart attack Mother   . Diabetes Mother   . Stroke Father   . Diabetes Father   . Pancreatic cancer Sister   . Cancer Sister        pancreatic  . Cancer Brother        liver  . Diabetes Brother   . Cancer Sister        anal  . Heart attack Brother   . Diabetes Brother   . Cancer Brother  stomach  . Cancer Brother   . Diabetes Brother   . Colon cancer Neg Hx    Social History   Socioeconomic History  . Marital status: Married    Spouse name: Not on file  . Number of children: 2  . Years of education: Not on file  . Highest education level: Not on file  Occupational History  . Occupation: Retired Engineer, site  Social Needs  . Financial resource strain: Not hard at all  . Food insecurity    Worry: Never true    Inability: Never true  . Transportation needs    Medical: No    Non-medical: No  Tobacco Use  . Smoking status: Former Smoker    Years: 1.00    Types: Cigarettes  . Smokeless tobacco: Never Used  . Tobacco comment: Smoked x 1 year in her 90's  Substance and Sexual Activity  . Alcohol use: No  . Drug use: No  . Sexual activity: Never    Lifestyle  . Physical activity    Days per week: Not on file    Minutes per session: Not on file  . Stress: Only a little  Relationships  . Social Musician on phone: Not on file    Gets together: Not on file    Attends religious service: Not on file    Active member of club or organization: Not on file    Attends meetings of clubs or organizations: Not on file    Relationship status: Not on file  Other Topics Concern  . Not on file  Social History Narrative   Daily Caffeine Use:  One cup coffee in am    Regular Exercise -  5 days a week x 1 hour - gym, aerobic and wt training          Outpatient Encounter Medications as of 08/10/2019  Medication Sig  . aspirin EC 81 MG tablet Take 81 mg by mouth daily.    . cholecalciferol (VITAMIN D) 1000 UNITS tablet Take 2,000 Units by mouth daily.   . Cranberry 425 MG CAPS Take by mouth daily.  Marland Kitchen diltiazem (CARDIZEM CD) 120 MG 24 hr capsule TAKE 1 CAPSULE BY MOUTH  EVERY DAY  . diphenhydramine-acetaminophen (TYLENOL PM) 25-500 MG TABS tablet Take 1 tablet by mouth at bedtime as needed.  Marland Kitchen glucosamine-chondroitin ([employer]S GLUCOSAMINE-CHONDROITIN) 500-400 MG tablet Take 1 tablet by mouth daily.   Marland Kitchen levothyroxine (SYNTHROID) 50 MCG tablet TAKE 1 TABLET BY MOUTH  DAILY  . Multiple Vitamins-Minerals (MULTIVITAMIN WITH MINERALS) tablet Take 1 tablet by mouth daily.    . Omega-3 Fatty Acids (FISH OIL) 1000 MG CPDR Take by mouth daily.  . rosuvastatin (CRESTOR) 10 MG tablet TAKE 1 TABLET BY MOUTH  DAILY  . telmisartan (MICARDIS) 20 MG tablet TAKE 1 TABLET (20 MG TOTAL) BY MOUTH DAILY. IN THE MORNING FOR BLOOD PRESSURE   No facility-administered encounter medications on file as of 08/10/2019.     Activities of Daily Living In your present state of health, do you have any difficulty performing the following activities: 08/10/2019  Hearing? N  Vision? N  Difficulty concentrating or making decisions? N  Walking or climbing stairs? N   Dressing or bathing? N  Doing errands, shopping? N  Preparing Food and eating ? N  Using the Toilet? N  In the past six months, have you accidently leaked urine? N  Do you have problems with loss of bowel control? N  Managing your  Medications? N  Managing your Finances? N  Housekeeping or managing your Housekeeping? N  Some recent data might be hidden    Patient Care Team: Sherlene Shams, MD as PCP - General (Internal Medicine)    Assessment:   This is a routine wellness examination for Ceonna.  I connected with patient 08/10/19 at 12:30 PM EDT by an audio enabled telemedicine application and verified that I am speaking with the correct person using two identifiers. Patient stated full name and DOB. Patient gave permission to continue with virtual visit. Patient's location was at home and Nurse's location was at [place] office.   Health Maintenance Due: See completed HM at the end of note.   Eye: Visual acuity not assessed. Virtual visit. Wears corrective lenses. Followed by their ophthalmologist every 12 months.   Dental: Visits every 6 months.    Hearing: Demonstrates normal hearing during visit.  Safety:  Patient feels safe at home- yes Patient does have smoke detectors at home- yes Patient does wear sunscreen or protective clothing when in direct sunlight - yes Patient does wear seat belt when in a moving vehicle - yes Patient drives- yes Adequate lighting in walkways free from debris- yes Grab bars and handrails used as appropriate- yes Ambulates with no assistive device Cell phone or lifeline/life alert/medic alert on person when ambulating outside of the home- yes  Social: Alcohol intake - no       Smoking history- former    Smokers in home? none Illicit drug use? none  Depression: PHQ 2 &9 complete. See screening below. Denies irritability, anhedonia, sadness/tearfullness.  Stable.   Falls: See screening below.    Medication: Taking as directed and  without issues.   Covid-19: Precautions and sickness symptoms discussed. Wears mask, social distancing, hand hygiene as appropriate.   Activities of Daily Living Patient denies needing assistance with: household chores, feeding themselves, getting from bed to chair, getting to the toilet, bathing/showering, dressing, managing money, or preparing meals.   Memory: Patient is alert. Patient denies difficulty focusing or concentrating. Correctly identified the president of the [place], season and recall. Patient likes to read and play computer games for brain stimulation.  BMI- discussed the importance of a healthy diet, water intake and the benefits of aerobic exercise.  Educational material provided.  Physical activity- walking 2 miles daily, 30 minutes  Diet: Regular Water: good intake Caffeine: 1 cup  Advanced Directive: End of life planning; Advance aging; Advanced directives discussed.   Declines additional information at this time.   Other Providers Patient Care Team: Sherlene Shams, MD as PCP - General (Internal Medicine)  Exercise Activities and Dietary recommendations Current Exercise Habits: Home exercise routine, Type of exercise: walking, Time (Minutes): 30, Frequency (Times/Week): 5, Weekly Exercise (Minutes/Week): 150, Intensity: Mild  Goals    . DIET - INCREASE WATER INTAKE       Fall Risk Fall Risk  08/10/2019 11/01/2018 07/31/2018 07/29/2017 07/27/2016  Falls in the past year? 0 0 No No No   Timed Get Up and Go performed: no, virtual visit  Depression Screen PHQ 2/9 Scores 08/10/2019 07/31/2018 07/29/2017 07/27/2016  PHQ - 2 Score 0 0 0 0  PHQ- 9 Score - - 0 -     Cognitive Function MMSE - Mini Mental State Exam 07/31/2018 07/29/2017 07/27/2016 07/25/2015  Orientation to time 5 5 5 5   Orientation to Place 5 5 5 5   Registration 3 3 3 3   Attention/ Calculation 5 5 5 5   Recall  3 3 3 3   Language- name 2 objects 2 2 2 2   Language- repeat 1 1 1 1   Language- follow 3 step  command 3 3 3 3   Language- read & follow direction 1 1 1 1   Write a sentence 1 1 1 1   Copy design 1 1 1 1   Total score 30 30 30 30      6CIT Screen 08/10/2019  What Year? 0 points  What month? 0 points  What time? 0 points  Count back from 20 0 points  Months in reverse 0 points  Repeat phrase 0 points  Total Score 0    Immunization History  Administered Date(s) Administered  . Influenza Split 08/18/2011, 08/04/2012  . Influenza Whole 09/10/2013  . Influenza, High Dose Seasonal PF 07/25/2015, 07/27/2016, 07/29/2017, 07/31/2018, 08/09/2019  . Influenza,inj,Quad PF,6+ Mos 09/06/2014  . Influenza,inj,quad, With Preservative 07/25/2018  . Pneumococcal Conjugate-13 12/07/2013  . Pneumococcal Polysaccharide-23 12/18/2007, 03/06/2018  . Tdap 11/20/2012, 09/29/2016  . Zoster 12/18/2007  . Zoster Recombinat (Shingrix) 09/24/2018, 12/06/2018   Screening Tests Health Maintenance  Topic Date Due  . INFLUENZA VACCINE  06/23/2019  . MAMMOGRAM  10/26/2019  . TETANUS/TDAP  09/29/2026  . DEXA SCAN  Completed  . PNA vac Low Risk Adult  Completed      Plan:   Keep all routine maintenance appointments.   Follow up 09/13/19 @ 10:00  Medicare Attestation I have personally reviewed: The patient's medical and social history Their use of alcohol, tobacco or illicit drugs Their current medications and supplements The patient's functional ability including ADLs,fall risks, home safety risks, cognitive, and hearing and visual impairment Diet and physical activities Evidence for depression   In addition, I have reviewed and discussed with patient certain preventive protocols, quality metrics, and best practice recommendations. A written personalized care plan for preventive services as well as general preventive health recommendations were provided to patient via mail.     Ashok Pall, LPN  02/28/8118

## 2019-08-10 NOTE — Patient Instructions (Addendum)
  [name] , Thank you for taking time to come for your Medicare Wellness Visit. I appreciate your ongoing commitment to your health goals. Please review the following plan we discussed and let me know if I can assist you in the future.   These are the goals we discussed: Goals    . DIET - INCREASE WATER INTAKE       This is a list of the screening recommended for you and due dates:  Health Maintenance  Topic Date Due  . Flu Shot  06/23/2019  . Mammogram  10/26/2019  . Tetanus Vaccine  09/29/2026  . DEXA scan (bone density measurement)  Completed  . Pneumonia vaccines  Completed

## 2019-08-15 DIAGNOSIS — H26492 Other secondary cataract, left eye: Secondary | ICD-10-CM | POA: Diagnosis not present

## 2019-08-28 ENCOUNTER — Ambulatory Visit: Payer: Medicare Other | Admitting: Internal Medicine

## 2019-09-11 ENCOUNTER — Other Ambulatory Visit: Payer: Self-pay

## 2019-09-13 ENCOUNTER — Other Ambulatory Visit: Payer: Self-pay

## 2019-09-13 ENCOUNTER — Encounter: Payer: Self-pay | Admitting: Internal Medicine

## 2019-09-13 ENCOUNTER — Ambulatory Visit (INDEPENDENT_AMBULATORY_CARE_PROVIDER_SITE_OTHER): Payer: Medicare Other | Admitting: Internal Medicine

## 2019-09-13 VITALS — BP 158/78 | HR 61 | Temp 96.9°F | Resp 16 | Ht 65.75 in | Wt 145.2 lb

## 2019-09-13 DIAGNOSIS — R7303 Prediabetes: Secondary | ICD-10-CM | POA: Diagnosis not present

## 2019-09-13 DIAGNOSIS — E039 Hypothyroidism, unspecified: Secondary | ICD-10-CM

## 2019-09-13 DIAGNOSIS — Z800 Family history of malignant neoplasm of digestive organs: Secondary | ICD-10-CM | POA: Diagnosis not present

## 2019-09-13 DIAGNOSIS — K5900 Constipation, unspecified: Secondary | ICD-10-CM | POA: Diagnosis not present

## 2019-09-13 DIAGNOSIS — I10 Essential (primary) hypertension: Secondary | ICD-10-CM | POA: Diagnosis not present

## 2019-09-13 LAB — TSH: TSH: 1.76 u[IU]/mL (ref 0.35–4.50)

## 2019-09-13 LAB — CBC WITH DIFFERENTIAL/PLATELET
Basophils Absolute: 0.1 10*3/uL (ref 0.0–0.1)
Basophils Relative: 1.1 % (ref 0.0–3.0)
Eosinophils Absolute: 0.3 10*3/uL (ref 0.0–0.7)
Eosinophils Relative: 5.2 % — ABNORMAL HIGH (ref 0.0–5.0)
HCT: 38.9 % (ref 36.0–46.0)
Hemoglobin: 13.1 g/dL (ref 12.0–15.0)
Lymphocytes Relative: 26.9 % (ref 12.0–46.0)
Lymphs Abs: 1.4 10*3/uL (ref 0.7–4.0)
MCHC: 33.7 g/dL (ref 30.0–36.0)
MCV: 94.8 fl (ref 78.0–100.0)
Monocytes Absolute: 0.4 10*3/uL (ref 0.1–1.0)
Monocytes Relative: 7.6 % (ref 3.0–12.0)
Neutro Abs: 3 10*3/uL (ref 1.4–7.7)
Neutrophils Relative %: 59.2 % (ref 43.0–77.0)
Platelets: 246 10*3/uL (ref 150.0–400.0)
RBC: 4.1 Mil/uL (ref 3.87–5.11)
RDW: 12.5 % (ref 11.5–15.5)
WBC: 5.1 10*3/uL (ref 4.0–10.5)

## 2019-09-13 LAB — COMPREHENSIVE METABOLIC PANEL
ALT: 23 U/L (ref 0–35)
AST: 25 U/L (ref 0–37)
Albumin: 4.4 g/dL (ref 3.5–5.2)
Alkaline Phosphatase: 43 U/L (ref 39–117)
BUN: 18 mg/dL (ref 6–23)
CO2: 31 mEq/L (ref 19–32)
Calcium: 9.8 mg/dL (ref 8.4–10.5)
Chloride: 101 mEq/L (ref 96–112)
Creatinine, Ser: 0.87 mg/dL (ref 0.40–1.20)
GFR: 62.92 mL/min (ref >60.00)
Glucose, Bld: 90 mg/dL (ref 70–99)
Potassium: 4.5 mEq/L (ref 3.5–5.1)
Sodium: 138 mEq/L (ref 135–145)
Total Bilirubin: 0.3 mg/dL (ref 0.2–1.2)
Total Protein: 7.4 g/dL (ref 6.0–8.3)

## 2019-09-13 LAB — HEMOGLOBIN A1C: Hgb A1c MFr Bld: 5.9 % (ref 4.6–6.5)

## 2019-09-13 LAB — MICROALBUMIN / CREATININE URINE RATIO
Creatinine,U: 24.1 mg/dL
Microalb Creat Ratio: 2.9 mg/g (ref 0.0–30.0)
Microalb, Ur: <0.7 mg/dL (ref 0.0–1.9)

## 2019-09-13 LAB — HEMOCCULT GUIAC POC 1CARD (OFFICE): Fecal Occult Blood, POC: NEGATIVE

## 2019-09-13 NOTE — Patient Instructions (Signed)
I recommend using Citrucel or Miralax on a nightly basis  Referral to GI in process

## 2019-09-13 NOTE — Progress Notes (Signed)
Subjective:  Patient ID: [name], female    DOB: [birth year]  Age: 78 y.o. MRN: [id number]  CC: The primary encounter diagnosis was Constipation, unspecified constipation type. Diagnoses of Prediabetes, Acquired hypothyroidism, Essential hypertension, and Family history of colon cancer were also pertinent to this visit.  HPI [name] presents for change in bowel movements  78 yr old female with FH of colon ca in 2 sisters present with new onset constipation for the past 6 weeks .  Passing small hard balls,  even when she urinates  Passes some spontaneous.   Up to 3 times daily,  then no BM for several days No normal caliber stools.   Drinks plenty of water daily . thinks she may have a hemorrhoid. Also notes subjective chills and tremors during prolonged sits (ten minutes ) to achieve defecation. The chills last only a few seconds.    Has been taking diltiazem for 3 years.  No change in diet. has tried Using metamucil  Prn with no change in BM   Last colonoscopy Feb 2017 polyp removed 5 yr follow up recommended.  FH of colon CA in 2 sisters.   [place]      Outpatient Medications Prior to Visit  Medication Sig Dispense Refill  . aspirin EC 81 MG tablet Take 81 mg by mouth daily.      . cholecalciferol (VITAMIN D) 1000 UNITS tablet Take 2,000 Units by mouth daily.     . Cranberry 425 MG CAPS Take by mouth daily.    Marland Kitchen diltiazem (CARDIZEM CD) 120 MG 24 hr capsule TAKE 1 CAPSULE BY MOUTH  EVERY DAY 90 capsule 0  . diphenhydramine-acetaminophen (TYLENOL PM) 25-500 MG TABS tablet Take 1 tablet by mouth at bedtime as needed.    Marland Kitchen glucosamine-chondroitin ([employer] GLUCOSAMINE-CHONDROITIN) 500-400 MG tablet Take 1 tablet by mouth daily.     Marland Kitchen levothyroxine (SYNTHROID) 50 MCG tablet TAKE 1 TABLET BY MOUTH  DAILY 90 tablet 3  . Multiple Vitamins-Minerals (MULTIVITAMIN WITH MINERALS) tablet Take 1 tablet by mouth daily.      . Omega-3 Fatty Acids (FISH OIL) 1000  MG CPDR Take by mouth daily.    . rosuvastatin (CRESTOR) 10 MG tablet TAKE 1 TABLET BY MOUTH  DAILY 90 tablet 3  . telmisartan (MICARDIS) 20 MG tablet TAKE 1 TABLET (20 MG TOTAL) BY MOUTH DAILY. IN THE MORNING FOR BLOOD PRESSURE 90 tablet 1   No facility-administered medications prior to visit.     Review of Systems;  Patient denies headache, fevers, malaise, unintentional weight loss, skin rash, eye pain, sinus congestion and sinus pain, sore throat, dysphagia,  hemoptysis , cough, dyspnea, wheezing, chest pain, palpitations, orthopnea, edema, abdominal pain, nausea, melena, diarrhea, constipation, flank pain, dysuria, hematuria, urinary  Frequency, nocturia, numbness, tingling, seizures,  Focal weakness, Loss of consciousness,  Tremor, insomnia, depression, anxiety, and suicidal ideation.      Objective:  BP (!) 158/78 (BP Location: Left Arm, Patient Position: Sitting, Cuff Size: Normal)   Pulse 61   Temp (!) 96.9 F (36.1 C) (Temporal)   Resp 16   Ht 5' 5.75" (1.67 m)   Wt 145 lb 3.2 oz (65.9 kg)   SpO2 99%   BMI 23.61 kg/m   BP Readings from Last 3 Encounters:  09/13/19 (!) 158/78  02/05/19 (!) 142/80  11/01/18 126/86    Wt Readings from Last 3 Encounters:  09/13/19 145 lb 3.2 oz (65.9 kg)  02/05/19  146 lb (66.2 kg)  11/01/18 149 lb (67.6 kg)    General appearance: alert, cooperative and appears stated age Ears: normal TM's and external ear canals both ears Throat: lips, mucosa, and tongue normal; teeth and gums normal Neck: no adenopathy, no carotid bruit, supple, symmetrical, trachea midline and thyroid not enlarged, symmetric, no tenderness/mass/nodules Back: symmetric, no curvature. ROM normal. No CVA tenderness. Lungs: clear to auscultation bilaterally Heart: regular rate and rhythm, S1, S2 normal, no murmur, click, rub or gallop Abdomen: soft, non-tender; bowel sounds normal; no masses,  no organomegaly Pulses: 2+ and symmetric Skin: Skin color, texture, turgor  normal. No rashes or lesions Lymph nodes: Cervical, supraclavicular, and axillary nodes normal. Rectal:  No masses, normal sphincter tone. No hemorrhoids. Stool guaiac negative   Lab Results  Component Value Date   HGBA1C 5.9 09/13/2019   HGBA1C 6.1 02/05/2019   HGBA1C 5.9 09/29/2018    Lab Results  Component Value Date   CREATININE 0.87 09/13/2019   CREATININE 0.91 02/05/2019   CREATININE 0.86 10/16/2018    Lab Results  Component Value Date   WBC 5.1 09/13/2019   HGB 13.1 09/13/2019   HCT 38.9 09/13/2019   PLT 246.0 09/13/2019   GLUCOSE 90 09/13/2019   CHOL 177 02/05/2019   TRIG 145.0 02/05/2019   HDL 62.40 02/05/2019   LDLDIRECT 85.0 03/06/2018   LDLCALC 86 02/05/2019   ALT 23 09/13/2019   AST 25 09/13/2019   NA 138 09/13/2019   K 4.5 09/13/2019   CL 101 09/13/2019   CREATININE 0.87 09/13/2019   BUN 18 09/13/2019   CO2 31 09/13/2019   TSH 1.76 09/13/2019   HGBA1C 5.9 09/13/2019   MICROALBUR <0.7 09/13/2019    Ct Chest Wo Contrast  Result Date: 02/07/2019 CLINICAL DATA:  Follow-up anterior mediastinal mass/lymph node. EXAM: CT CHEST WITHOUT CONTRAST TECHNIQUE: Multidetector CT imaging of the chest was performed following the standard protocol without IV contrast. COMPARISON:  Cardiac CTA 10/23/2018 FINDINGS: Cardiovascular: Mild thoracic aortic atherosclerosis without aneurysm. Normal heart size. No pericardial effusion. Mediastinum/Nodes: Unchanged 2.0 x 1.8 cm slightly lobular focus of intermediate soft tissue attenuation in the anterior mediastinum. No enlarged lymph nodes identified elsewhere in the mediastinum, hila, or axilla. Possible tiny sliding hiatal hernia. Unremarkable thyroid. Lungs/Pleura: No pleural effusion or pneumothorax. Biapical pleuroparenchymal scarring. Minimal scarring or atelectasis in the left lung base. No suspicious lung nodule. Upper Abdomen: Small diverticulum projecting posteriorly from the gastric fundus. Musculoskeletal: No acute osseous  abnormality or suspicious osseous lesion. Mild thoracic spondylosis. IMPRESSION: 1. Unchanged 2.0 x 1.8 cm anterior mediastinal mass or lymph node. While this has a benign appearance, an additional follow-up CT is recommended in 6-9 months to establish a longer period of stability. 2. No new intrathoracic abnormality. 3. Aortic Atherosclerosis (ICD10-I70.0). Electronically Signed   By: Sebastian Ache M.D.   On: 02/07/2019 15:49    Assessment & Plan:   Problem List Items Addressed This Visit      Unprioritized   Acquired hypothyroidism    Thyroid function is WNL on current dose.  No current changes needed.   Lab Results  Component Value Date   TSH 1.76 09/13/2019         Relevant Orders   TSH (Completed)   Family history of colon cancer    2 sisters with colon CA       Constipation - Primary    New onset,  For the past 6 weeks.  Etiology unlikely to be medication or diet  induced .  Referring to GI for colonoscopy one year early       Relevant Orders   POCT Occult Blood Stool (Completed)   CBC with Differential/Platelet (Completed)   Ambulatory referral to Gastroenterology   Prediabetes   Relevant Orders   Comprehensive metabolic panel (Completed)   Hemoglobin A1c (Completed)    Other Visit Diagnoses    Essential hypertension       Relevant Orders   Microalbumin / creatinine urine ratio (Completed)      I am having Corinna Capra. Faye maintain her aspirin EC, multivitamin with minerals, glucosamine-chondroitin, cholecalciferol, Fish Oil, diphenhydramine-acetaminophen, Cranberry, telmisartan, rosuvastatin, levothyroxine, and diltiazem.  No orders of the defined types were placed in this encounter.   There are no discontinued medications.  Follow-up: Return in about 6 months (around 03/13/2020).   Sherlene Shams, MD

## 2019-09-15 DIAGNOSIS — K5900 Constipation, unspecified: Secondary | ICD-10-CM | POA: Insufficient documentation

## 2019-09-15 NOTE — Assessment & Plan Note (Signed)
New onset,  For the past 6 weeks.  Etiology unlikely to be medication or diet induced .  Referring to GI for colonoscopy one year early

## 2019-09-15 NOTE — Assessment & Plan Note (Addendum)
Her  a1c has improved with adherence to low glycemic index diet.    Lab Results  Component Value Date   HGBA1C 5.9 09/13/2019

## 2019-09-15 NOTE — Assessment & Plan Note (Signed)
Thyroid function is WNL on current dose.  No current changes needed.   Lab Results  Component Value Date   TSH 1.76 09/13/2019

## 2019-09-15 NOTE — Assessment & Plan Note (Signed)
2 sisters with colon CA

## 2019-09-20 ENCOUNTER — Telehealth: Payer: Self-pay

## 2019-09-20 NOTE — Telephone Encounter (Signed)
Copied from [place] [id number]. Topic: Referral - Question >> Sep 20, 2019  4:26 PM Reyne Dumas L wrote: Reason for CRM:   Pt would like her GI referral redone to Dr. Verl Blalock in [place] at the request of her daughter. Pt can be reached at [phone]

## 2019-09-24 DIAGNOSIS — L84 Corns and callosities: Secondary | ICD-10-CM | POA: Diagnosis not present

## 2019-09-24 DIAGNOSIS — M21962 Unspecified acquired deformity of left lower leg: Secondary | ICD-10-CM | POA: Diagnosis not present

## 2019-09-24 DIAGNOSIS — M7742 Metatarsalgia, left foot: Secondary | ICD-10-CM | POA: Diagnosis not present

## 2019-09-24 DIAGNOSIS — M216X1 Other acquired deformities of right foot: Secondary | ICD-10-CM | POA: Diagnosis not present

## 2019-09-24 DIAGNOSIS — M2012 Hallux valgus (acquired), left foot: Secondary | ICD-10-CM | POA: Diagnosis not present

## 2019-10-27 ENCOUNTER — Other Ambulatory Visit: Payer: Self-pay | Admitting: Physician Assistant

## 2019-10-31 ENCOUNTER — Encounter: Payer: Self-pay | Admitting: Gastroenterology

## 2019-10-31 ENCOUNTER — Other Ambulatory Visit: Payer: Self-pay

## 2019-10-31 ENCOUNTER — Ambulatory Visit (INDEPENDENT_AMBULATORY_CARE_PROVIDER_SITE_OTHER): Payer: Medicare Other | Admitting: Gastroenterology

## 2019-10-31 VITALS — BP 150/77 | HR 66 | Temp 96.6°F | Ht 65.0 in | Wt 144.2 lb

## 2019-10-31 DIAGNOSIS — Z800 Family history of malignant neoplasm of digestive organs: Secondary | ICD-10-CM

## 2019-10-31 NOTE — Progress Notes (Signed)
Gastroenterology Consultation  Referring Provider:     [name], MD Primary Care Physician:  [name], MD Primary Gastroenterologist:  Dr. Allen Norris     Reason for Consultation:     Constipation and family history of colon cancer        HPI:   [name] is a 78 y.o. y/o female referred for consultation & management of constipation and family history of colon cancer by Dr. [name], MD.  This patient comes in today after being seen in the past by Dr. Vira Agar for colonoscopy in 2013 and a repeat colonoscopy by Dr. Candace Cruise in 2017.  At the last colonoscopy the patient had one polyp that was a hyperplastic polyp and was recommended that the patient have a repeat colonoscopy in 5 years.  The patient was seen by her primary care provider on October 24 of this year and had reported a new onset of worsening constipation.  It was reported that Dr. Derrel Nip did not think that the constipation was medication related.  The patient was sent to me for evaluation and possible repeat colonoscopy at a shorter interval due to the patient's new symptoms. The patient endorses that after starting MiraLAX her symptoms improved.  Prior to the MiraLAX she was having marble sized hard stools that was new for her.  The patient does report that she has a family history of colon cancer in 2 of her sisters.  They were both diagnosed around the age of 22 and are now deceased.  The patient is concerned about her change in bowel habits but she is relieved that the symptoms have improved on the MiraLAX.  Past Medical History:  Diagnosis Date  . Arthritis    knees  . Hyperlipidemia   . Hypothyroidism   . PVC's (premature ventricular contractions)    a.  48-hour Holter in 3/17 demonstrated NSR, frequent PVCs totaling 5300 beats representing 3% burden, occasional PACs, short runs of SVT    Past Surgical History:  Procedure Laterality Date  . ABDOMINAL HYSTERECTOMY     In 30's  . BROW LIFT Bilateral  07/12/2017   Procedure: BLEPHAROPLASTY upper eyelid with excess skin;  Surgeon: Karle Starch, MD;  Location: [facility];  Service: Ophthalmology;  Laterality: Bilateral;  MAC  . COLONOSCOPY WITH PROPOFOL N/A 12/29/2015   Procedure: COLONOSCOPY WITH PROPOFOL;  Surgeon: Hulen Luster, MD;  Location: [facility] ENDOSCOPY;  Service: Gastroenterology;  Laterality: N/A;  . PTOSIS REPAIR Bilateral 07/12/2017   Procedure: ptosis repair resect ex;  Surgeon: Karle Starch, MD;  Location: [facility];  Service: Ophthalmology;  Laterality: Bilateral;  Requests early  . VAGINAL PROLAPSE REPAIR  2014   Dr. Enzo Bi    Prior to Admission medications   Medication Sig Start Date End Date Taking? Authorizing Provider  aspirin EC 81 MG tablet Take 81 mg by mouth daily.      [provider]  cholecalciferol (VITAMIN D) 1000 UNITS tablet Take 2,000 Units by mouth daily.     [provider]  Cranberry 425 MG CAPS Take by mouth daily.    [provider]  diltiazem (CARDIZEM CD) 120 MG 24 hr capsule TAKE 1 CAPSULE BY MOUTH  DAILY 10/29/19   Dunn, Areta Haber, PA-C  diphenhydramine-acetaminophen (TYLENOL PM) 25-500 MG TABS tablet Take 1 tablet by mouth at bedtime as needed.    [provider]  glucosamine-chondroitin ([employer]S GLUCOSAMINE-CHONDROITIN) 500-400 MG tablet Take 1 tablet by mouth daily.  [provider]  levothyroxine (SYNTHROID) 50 MCG tablet TAKE 1 TABLET BY MOUTH  DAILY 08/08/19   [name], MD  Multiple Vitamins-Minerals (MULTIVITAMIN WITH MINERALS) tablet Take 1 tablet by mouth daily.      [provider]  Omega-3 Fatty Acids (FISH OIL) 1000 MG CPDR Take by mouth daily.    [provider]  rosuvastatin (CRESTOR) 10 MG tablet TAKE 1 TABLET BY MOUTH  DAILY 08/08/19   [name], MD  telmisartan (MICARDIS) 20 MG tablet TAKE 1 TABLET (20 MG TOTAL) BY MOUTH DAILY. IN THE MORNING FOR BLOOD PRESSURE 06/11/19   [name], MD     Family History  Problem Relation Age of Onset  . Breast cancer Mother        40's  . Heart attack Mother   . Diabetes Mother   . Stroke Father   . Diabetes Father   . Pancreatic cancer Sister   . Cancer Sister        pancreatic  . Cancer Brother        liver  . Diabetes Brother   . Cancer Sister        anal  . Heart attack Brother   . Diabetes Brother   . Cancer Brother        stomach  . Cancer Brother   . Diabetes Brother   . Colon cancer Neg Hx      Social History   Tobacco Use  . Smoking status: Former Smoker    Years: 1.00    Types: Cigarettes  . Smokeless tobacco: Never Used  . Tobacco comment: Smoked x 1 year in her 20's  Substance Use Topics  . Alcohol use: No  . Drug use: No    Allergies as of 10/31/2019 - Review Complete 09/13/2019  Allergen Reaction Noted  . Prednisone  11/17/2011    Review of Systems:    All systems reviewed and negative except where noted in HPI.   Physical Exam:  There were no vitals taken for this visit. No LMP recorded. Patient has had a hysterectomy. General:   Alert,  Well-developed, well-nourished, pleasant and cooperative in NAD Head:  Normocephalic and atraumatic. Eyes:  Sclera clear, no icterus.   Conjunctiva pink. Ears:  Normal auditory acuity. Neck:  Supple; no masses or thyromegaly. Lungs:  Respirations even and unlabored.  Clear throughout to auscultation.   No wheezes, crackles, or rhonchi. No acute distress. Heart:  Regular rate and rhythm; no murmurs, clicks, rubs, or gallops. Abdomen:  Normal bowel sounds.  No bruits.  Soft, non-tender and non-distended without masses, hepatosplenomegaly or hernias noted.  No guarding or rebound tenderness.  Negative Carnett sign.   Rectal:  Deferred.  Msk:  Symmetrical without gross deformities.  Good, equal movement & strength bilaterally. Pulses:  Normal pulses noted. Extremities:  No clubbing or edema.  No cyanosis. Neurologic:  Alert and oriented x3;  grossly normal  neurologically. Skin:  Intact without significant lesions or rashes.  No jaundice. Lymph Nodes:  No significant cervical adenopathy. Psych:  Alert and cooperative. Normal mood and affect.  Imaging Studies: No results found.  Assessment and Plan:   [name] is a 78 y.o. y/o female who comes in with a history of 2 sisters with colon cancer.  The patient was due for colonoscopy next year but it was recommended that she come to see me today because of the change in bowel habits.  The patient has been told that  the MiraLAX may be masking something causing her constipation although it may be a motility or diet issue.  She has been told with her strong family history with both her sisters having colon cancer it would be prudent to schedule a colonoscopy earlier to look for a possible cause of her change bowel habits. I have discussed risks & benefits which include, but are not limited to, bleeding, infection, perforation & drug reaction.  The patient agrees with this plan & written consent will be obtained.       [name], MD. Marval Regal    Note: This dictation was prepared with Dragon dictation along with smaller phrase technology. Any transcriptional errors that result from this process are unintentional.

## 2019-11-13 ENCOUNTER — Other Ambulatory Visit: Payer: Self-pay

## 2019-11-13 MED ORDER — DILTIAZEM HCL ER COATED BEADS 120 MG PO CP24: ORAL_CAPSULE | ORAL | 0 refills | Status: DC

## 2019-11-27 ENCOUNTER — Other Ambulatory Visit: Payer: Self-pay

## 2019-11-27 ENCOUNTER — Encounter: Payer: Self-pay | Admitting: Gastroenterology

## 2019-11-29 ENCOUNTER — Other Ambulatory Visit
Admission: RE | Admit: 2019-11-29 | Discharge: 2019-11-29 | Disposition: A | Discharge status: Home / Self Care | Payer: Medicare Other | Source: Ambulatory Visit | Attending: Gastroenterology | Admitting: Gastroenterology

## 2019-11-29 ENCOUNTER — Other Ambulatory Visit: Payer: Self-pay

## 2019-11-29 DIAGNOSIS — Z20822 Contact with and (suspected) exposure to covid-19: Secondary | ICD-10-CM | POA: Diagnosis not present

## 2019-11-29 DIAGNOSIS — Z01812 Encounter for preprocedural laboratory examination: Secondary | ICD-10-CM | POA: Insufficient documentation

## 2019-11-30 LAB — SARS CORONAVIRUS 2 (TAT 6-24 HRS): SARS Coronavirus 2: NEGATIVE

## 2019-11-30 NOTE — Discharge Instructions (Signed)
General Anesthesia, Adult, Care After This sheet gives you information about how to care for yourself after your procedure. Your health care provider may also give you more specific instructions. If you have problems or questions, contact your health care provider. What can I expect after the procedure? After the procedure, the following side effects are common:  Pain or discomfort at the IV site.  Nausea.  Vomiting.  Sore throat.  Trouble concentrating.  Feeling cold or chills.  Weak or tired.  Sleepiness and fatigue.  Soreness and body aches. These side effects can affect parts of the body that were not involved in surgery. Follow these instructions at home:  For at least 24 hours after the procedure:  Have a responsible adult stay with you. It is important to have someone help care for you until you are awake and alert.  Rest as needed.  Do not: ? Participate in activities in which you could fall or become injured. ? Drive. ? Use heavy machinery. ? Drink alcohol. ? Take sleeping pills or medicines that cause drowsiness. ? Make important decisions or sign legal documents. ? Take care of children on your own. Eating and drinking  Follow any instructions from your health care provider about eating or drinking restrictions.  When you feel hungry, start by eating small amounts of foods that are soft and easy to digest (bland), such as toast. Gradually return to your regular diet.  Drink enough fluid to keep your urine pale yellow.  If you vomit, rehydrate by drinking water, juice, or clear broth. General instructions  If you have sleep apnea, surgery and certain medicines can increase your risk for breathing problems. Follow instructions from your health care provider about wearing your sleep device: ? Anytime you are sleeping, including during daytime naps. ? While taking prescription pain medicines, sleeping medicines, or medicines that make you drowsy.  Return to  your normal activities as told by your health care provider. Ask your health care provider what activities are safe for you.  Take over-the-counter and prescription medicines only as told by your health care provider.  If you smoke, do not smoke without supervision.  Keep all follow-up visits as told by your health care provider. This is important. Contact a health care provider if:  You have nausea or vomiting that does not get better with medicine.  You cannot eat or drink without vomiting.  You have pain that does not get better with medicine.  You are unable to pass urine.  You develop a skin rash.  You have a fever.  You have redness around your IV site that gets worse. Get help right away if:  You have difficulty breathing.  You have chest pain.  You have blood in your urine or stool, or you vomit blood. Summary  After the procedure, it is common to have a sore throat or nausea. It is also common to feel tired.  Have a responsible adult stay with you for the first 24 hours after general anesthesia. It is important to have someone help care for you until you are awake and alert.  When you feel hungry, start by eating small amounts of foods that are soft and easy to digest (bland), such as toast. Gradually return to your regular diet.  Drink enough fluid to keep your urine pale yellow.  Return to your normal activities as told by your health care provider. Ask your health care provider what activities are safe for you. This information is not   intended to replace advice given to you by your health care provider. Make sure you discuss any questions you have with your health care provider. Document Revised: 11/11/2017 Document Reviewed: 06/24/2017 Elsevier Patient Education  2020 [employer]  

## 2019-12-03 ENCOUNTER — Other Ambulatory Visit: Payer: Self-pay

## 2019-12-03 ENCOUNTER — Ambulatory Visit: Payer: Medicare Other | Admitting: Anesthesiology

## 2019-12-03 ENCOUNTER — Encounter: Payer: Self-pay | Admitting: Gastroenterology

## 2019-12-03 ENCOUNTER — Encounter
Admission: RE | Disposition: A | Discharge status: Home / Self Care | Payer: Self-pay | Source: Home / Self Care | Attending: Gastroenterology

## 2019-12-03 ENCOUNTER — Ambulatory Visit
Admission: RE | Admit: 2019-12-03 | Discharge: 2019-12-03 | Disposition: A | Discharge status: Home / Self Care | Payer: Medicare Other | Attending: Gastroenterology | Admitting: Gastroenterology

## 2019-12-03 DIAGNOSIS — Z803 Family history of malignant neoplasm of breast: Secondary | ICD-10-CM | POA: Diagnosis not present

## 2019-12-03 DIAGNOSIS — Z7982 Long term (current) use of aspirin: Secondary | ICD-10-CM | POA: Diagnosis not present

## 2019-12-03 DIAGNOSIS — E785 Hyperlipidemia, unspecified: Secondary | ICD-10-CM | POA: Insufficient documentation

## 2019-12-03 DIAGNOSIS — E039 Hypothyroidism, unspecified: Secondary | ICD-10-CM | POA: Diagnosis not present

## 2019-12-03 DIAGNOSIS — M170 Bilateral primary osteoarthritis of knee: Secondary | ICD-10-CM | POA: Insufficient documentation

## 2019-12-03 DIAGNOSIS — D122 Benign neoplasm of ascending colon: Secondary | ICD-10-CM | POA: Diagnosis not present

## 2019-12-03 DIAGNOSIS — Z79890 Hormone replacement therapy (postmenopausal): Secondary | ICD-10-CM | POA: Insufficient documentation

## 2019-12-03 DIAGNOSIS — Z800 Family history of malignant neoplasm of digestive organs: Secondary | ICD-10-CM

## 2019-12-03 DIAGNOSIS — Z1211 Encounter for screening for malignant neoplasm of colon: Secondary | ICD-10-CM | POA: Insufficient documentation

## 2019-12-03 DIAGNOSIS — K635 Polyp of colon: Secondary | ICD-10-CM | POA: Diagnosis not present

## 2019-12-03 DIAGNOSIS — Z79899 Other long term (current) drug therapy: Secondary | ICD-10-CM | POA: Insufficient documentation

## 2019-12-03 DIAGNOSIS — K5730 Diverticulosis of large intestine without perforation or abscess without bleeding: Secondary | ICD-10-CM | POA: Diagnosis not present

## 2019-12-03 HISTORY — PX: COLONOSCOPY WITH PROPOFOL: SHX5780

## 2019-12-03 HISTORY — PX: POLYPECTOMY: SHX5525

## 2019-12-03 SURGERY — COLONOSCOPY WITH PROPOFOL
Anesthesia: General | Site: Rectum

## 2019-12-03 MED ORDER — LACTATED RINGERS IV SOLN: INTRAVENOUS | Status: DC

## 2019-12-03 MED ORDER — SODIUM CHLORIDE 0.9 % IV SOLN: INTRAVENOUS | Status: DC

## 2019-12-03 MED ORDER — LIDOCAINE HCL (CARDIAC) PF 100 MG/5ML IV SOSY
PREFILLED_SYRINGE | INTRAVENOUS | Status: DC | PRN
  Administered 2019-12-03: 30 mg via INTRAVENOUS

## 2019-12-03 MED ORDER — GLYCOPYRROLATE 0.2 MG/ML IJ SOLN
INTRAMUSCULAR | Status: DC | PRN
  Administered 2019-12-03 (×2): .1 mg via INTRAVENOUS

## 2019-12-03 MED ORDER — PROPOFOL 10 MG/ML IV BOLUS
INTRAVENOUS | Status: DC | PRN
  Administered 2019-12-03 (×2): 30 mg via INTRAVENOUS
  Administered 2019-12-03: 20 mg via INTRAVENOUS
  Administered 2019-12-03: 30 mg via INTRAVENOUS
  Administered 2019-12-03: 20 mg via INTRAVENOUS
  Administered 2019-12-03: 70 mg via INTRAVENOUS
  Administered 2019-12-03: 30 mg via INTRAVENOUS

## 2019-12-03 MED ORDER — STERILE WATER FOR IRRIGATION IR SOLN
Status: DC | PRN
  Administered 2019-12-03: 100 mL

## 2019-12-03 SURGICAL SUPPLY — 6 items
CANISTER SUCT 1200ML W/VALVE (MISCELLANEOUS) ×2 IMPLANT
FORCEPS BIOP RAD 4 LRG CAP 4 (CUTTING FORCEPS) ×2 IMPLANT
GOWN CVR UNV OPN BCK APRN NK (MISCELLANEOUS) ×2 IMPLANT
GOWN ISOL THUMB LOOP REG UNIV (MISCELLANEOUS) ×2
KIT ENDO PROCEDURE OLY (KITS) ×2 IMPLANT
WATER STERILE IRR 250ML POUR (IV SOLUTION) ×2 IMPLANT

## 2019-12-03 NOTE — H&P (Signed)
Karen Lame, MD [name] [address] [place], [place] [address] Phone:[phone] Fax : [phone]  Primary Care Physician:  Karen Mc, MD Primary Gastroenterologist:  Dr. Allen Norris  Pre-Procedure History & Physical: HPI:  [name] is a 79 y.o. female is here for an colonoscopy.   Past Medical History:  Diagnosis Date  . Arthritis    knees  . Hyperlipidemia   . Hypothyroidism   . PVC's (premature ventricular contractions)    a.  48-hour Holter in 3/17 demonstrated NSR, frequent PVCs totaling 5300 beats representing 3% burden, occasional PACs, short runs of SVT    Past Surgical History:  Procedure Laterality Date  . ABDOMINAL HYSTERECTOMY     In 30's  . BROW LIFT Bilateral 07/12/2017   Procedure: BLEPHAROPLASTY upper eyelid with excess skin;  Surgeon: Karle Starch, MD;  Location: [place];  Service: Ophthalmology;  Laterality: Bilateral;  MAC  . COLONOSCOPY WITH PROPOFOL N/A 12/29/2015   Procedure: COLONOSCOPY WITH PROPOFOL;  Surgeon: Hulen Luster, MD;  Location: [facility] ENDOSCOPY;  Service: Gastroenterology;  Laterality: N/A;  . PTOSIS REPAIR Bilateral 07/12/2017   Procedure: ptosis repair resect ex;  Surgeon: Karle Starch, MD;  Location: [place];  Service: Ophthalmology;  Laterality: Bilateral;  Requests early  . VAGINAL PROLAPSE REPAIR  2014   Dr. Enzo Bi    Prior to Admission medications   Medication Sig Start Date End Date Taking? Authorizing Provider  aspirin EC 81 MG tablet Take 81 mg by mouth daily.     Yes [provider]  cholecalciferol (VITAMIN D) 1000 UNITS tablet Take 2,000 Units by mouth daily.    Yes [provider]  Cranberry 425 MG CAPS Take by mouth daily.   Yes [provider]  diltiazem (CARDIZEM CD) 120 MG 24 hr capsule TAKE 1 CAPSULE BY MOUTH  DAILY 11/13/19  Yes Dunn, Ryan M, PA-C  diphenhydramine-acetaminophen (TYLENOL PM) 25-500 MG TABS tablet Take 1 tablet by mouth at bedtime as  needed.   Yes [provider]  glucosamine-chondroitin ([employer]S GLUCOSAMINE-CHONDROITIN) 500-400 MG tablet Take 1 tablet by mouth daily.    Yes [provider]  levothyroxine (SYNTHROID) 50 MCG tablet TAKE 1 TABLET BY MOUTH  DAILY 08/08/19  Yes Karen Mc, MD  Multiple Vitamins-Minerals (MULTIVITAMIN WITH MINERALS) tablet Take 1 tablet by mouth daily.     Yes [provider]  Omega-3 Fatty Acids (FISH OIL) 1000 MG CPDR Take by mouth daily.   Yes [provider]  rosuvastatin (CRESTOR) 10 MG tablet TAKE 1 TABLET BY MOUTH  DAILY 08/08/19  Yes Karen Mc, MD  telmisartan (MICARDIS) 20 MG tablet TAKE 1 TABLET (20 MG TOTAL) BY MOUTH DAILY. IN THE MORNING FOR BLOOD PRESSURE 06/11/19  Yes Karen Mc, MD    Allergies as of 10/31/2019 - Review Complete 10/31/2019  Allergen Reaction Noted  . Prednisone  11/17/2011    Family History  Problem Relation Age of Onset  . Breast cancer Mother        83's  . Heart attack Mother   . Diabetes Mother   . Stroke Father   . Diabetes Father   . Pancreatic cancer Sister   . Cancer Sister        pancreatic  . Cancer Brother        liver  . Diabetes Brother   . Cancer Sister        anal  . Heart attack Brother   .  Diabetes Brother   . Cancer Brother        stomach  . Cancer Brother   . Diabetes Brother   . Colon cancer Neg Hx     Social History   Socioeconomic History  . Marital status: Married    Spouse name: Not on file  . Number of children: 2  . Years of education: Not on file  . Highest education level: Not on file  Occupational History  . Occupation: Retired Education officer, museum  Tobacco Use  . Smoking status: Former Smoker    Years: 1.00    Types: Cigarettes  . Smokeless tobacco: Never Used  . Tobacco comment: Smoked x 1 year in her 29's  Substance and Sexual Activity  . Alcohol use: No  . Drug use: No  . Sexual activity: Never  Other Topics Concern  . Not on file  Social History  Narrative   Daily Caffeine Use:  One cup coffee in am    Regular Exercise -  5 days a week x 1 hour - gym, aerobic and wt training         Social Determinants of Health   Financial Resource Strain:   . Difficulty of Paying Living Expenses: Not on file  Food Insecurity:   . Worried About Charity fundraiser in the Last Year: Not on file  . Ran Out of Food in the Last Year: Not on file  Transportation Needs:   . Lack of Transportation (Medical): Not on file  . Lack of Transportation (Non-Medical): Not on file  Physical Activity:   . Days of Exercise per Week: Not on file  . Minutes of Exercise per Session: Not on file  Stress: No Stress Concern Present  . Feeling of Stress : Only a little  Social Connections:   . Frequency of Communication with Friends and Family: Not on file  . Frequency of Social Gatherings with Friends and Family: Not on file  . Attends Religious Services: Not on file  . Active Member of Clubs or Organizations: Not on file  . Attends Archivist Meetings: Not on file  . Marital Status: Not on file  Intimate Partner Violence:   . Fear of Current or Ex-Partner: Not on file  . Emotionally Abused: Not on file  . Physically Abused: Not on file  . Sexually Abused: Not on file    Review of Systems: See HPI, otherwise negative ROS  Physical Exam: BP (!) 141/66   Pulse 74   Temp 97.7 F (36.5 C) (Temporal)   Resp 16   Ht _0  (1.702 m)   Wt 64.5 kg   SpO2 100%   BMI 22.27 kg/m  General:   Alert,  pleasant and cooperative in NAD Head:  Normocephalic and atraumatic. Neck:  Supple; no masses or thyromegaly. Lungs:  Clear throughout to auscultation.    Heart:  Regular rate and rhythm. Abdomen:  Soft, nontender and nondistended. Normal bowel sounds, without guarding, and without rebound.   Neurologic:  Alert and  oriented x4;  grossly normal neurologically.  Impression/Plan: [name] is here for an colonoscopy to be performed for family  history of colon cancer in 2 sisters.  Risks, benefits, limitations, and alternatives regarding  colonoscopy have been reviewed with the patient.  Questions have been answered.  All parties agreeable.   Karen Lame, MD  12/03/2019, 7:23 AM

## 2019-12-03 NOTE — Transfer of Care (Signed)
Immediate Anesthesia Transfer of Care Note  Patient: [name]  Procedure(s) Performed: COLONOSCOPY WITH BIOPSY (N/A Rectum) POLYPECTOMY (N/A Rectum)  Patient Location: PACU  Anesthesia Type: General  Level of Consciousness: awake, alert  and patient cooperative  Airway and Oxygen Therapy: Patient Spontanous Breathing and Patient connected to supplemental oxygen  Post-op Assessment: Post-op Vital signs reviewed, Patient's Cardiovascular Status Stable, Respiratory Function Stable, Patent Airway and No signs of Nausea or vomiting  Post-op Vital Signs: Reviewed and stable  Complications: No apparent anesthesia complications

## 2019-12-03 NOTE — Anesthesia Preprocedure Evaluation (Signed)
Anesthesia Evaluation  Patient identified by MRN, date of birth, ID band Patient awake    Reviewed: Allergy & Precautions, H&P , NPO status , Patient's Chart, lab work & pertinent test results, reviewed documented beta blocker date and time   Airway Mallampati: II  TM Distance: >3 FB Neck ROM: full    Dental no notable dental hx.    Pulmonary neg pulmonary ROS, former smoker,    Pulmonary exam normal breath sounds clear to auscultation       Cardiovascular Exercise Tolerance: Good + dysrhythmias Supra Ventricular Tachycardia  Rhythm:regular Rate:Normal  PVCs   Neuro/Psych negative neurological ROS  negative psych ROS   GI/Hepatic negative GI ROS, Neg liver ROS,   Endo/Other  Hypothyroidism   Renal/GU negative Renal ROS  negative genitourinary   Musculoskeletal   Abdominal   Peds  Hematology negative hematology ROS (+)   Anesthesia Other Findings   Reproductive/Obstetrics negative OB ROS                             Anesthesia Physical Anesthesia Plan  ASA: II  Anesthesia Plan: General   Post-op Pain Management:    Induction:   PONV Risk Score and Plan: 3 and Propofol infusion, TIVA and Treatment may vary due to age or medical condition  Airway Management Planned:   Additional Equipment:   Intra-op Plan:   Post-operative Plan:   Informed Consent: I have reviewed the patients History and Physical, chart, labs and discussed the procedure including the risks, benefits and alternatives for the proposed anesthesia with the patient or authorized representative who has indicated his/her understanding and acceptance.     Dental Advisory Given  Plan Discussed with: CRNA  Anesthesia Plan Comments:         Anesthesia Quick Evaluation

## 2019-12-03 NOTE — Anesthesia Postprocedure Evaluation (Signed)
Anesthesia Post Note  Patient: [name]  Procedure(s) Performed: COLONOSCOPY WITH BIOPSY (N/A Rectum) POLYPECTOMY (N/A Rectum)     Patient location during evaluation: PACU Anesthesia Type: General Level of consciousness: awake and alert Pain management: pain level controlled Vital Signs Assessment: post-procedure vital signs reviewed and stable Respiratory status: spontaneous breathing, nonlabored ventilation, respiratory function stable and patient connected to nasal cannula oxygen Cardiovascular status: blood pressure returned to baseline and stable Postop Assessment: no apparent nausea or vomiting Anesthetic complications: no    Alisa Graff

## 2019-12-03 NOTE — Op Note (Signed)
[facility] Gastroenterology Patient Name: [name] Procedure Date: 12/03/2019 7:54 AM MRN: [id number] Account #: [id number] Date of Birth: 04/29/1941 Admit Type: Outpatient Age: 79 Room: [facility] OR ROOM 01 Gender: Female Note Status: Finalized Procedure:             Colonoscopy Indications:           Family history of colon cancer in multiple                         first-degree relatives Providers:             [name] MD, MD Referring MD:          Deborra Medina, MD (Referring MD) Medicines:             Propofol per Anesthesia Complications:         No immediate complications. Procedure:             Pre-Anesthesia Assessment:                        - Prior to the procedure, a History and Physical was                         performed, and patient medications and allergies were                         reviewed. The patient's tolerance of previous                         anesthesia was also reviewed. The risks and benefits                         of the procedure and the sedation options and risks                         were discussed with the patient. All questions were                         answered, and informed consent was obtained. Prior                         Anticoagulants: The patient has taken no previous                         anticoagulant or antiplatelet agents. ASA Grade                         Assessment: II - A patient with mild systemic disease.                         After reviewing the risks and benefits, the patient                         was deemed in satisfactory condition to undergo the                         procedure.  After obtaining informed consent, the colonoscope was                         passed under direct vision. Throughout the procedure,                         the patient's blood pressure, pulse, and oxygen                         saturations were monitored continuously. The was            introduced through the anus and advanced to the the                         cecum, identified by appendiceal orifice and ileocecal                         valve. The colonoscopy was performed without                         difficulty. The patient tolerated the procedure well.                         The quality of the bowel preparation was excellent. Findings:      The perianal and digital rectal examinations were normal.      A 3 mm polyp was found in the ascending colon. The polyp was sessile.       The polyp was removed with a cold biopsy forceps. Resection and       retrieval were complete.      A few small-mouthed diverticula were found in the sigmoid colon. Impression:            - One 3 mm polyp in the ascending colon, removed with                         a cold biopsy forceps. Resected and retrieved.                        - Diverticulosis in the sigmoid colon. Recommendation:        - Discharge patient to home.                        - Resume previous diet.                        - Continue present medications.                        - Await pathology results. Procedure Code(s):     --- Professional ---                        [phone], Colonoscopy, flexible; with biopsy, single or                         multiple Diagnosis Code(s):     --- Professional ---                        Z80.0, Family history of malignant neoplasm of  digestive organs                        K63.5, Polyp of colon CPT copyright 2019 American Medical Association. All rights reserved. The codes documented in this report are preliminary and upon coder review may  be revised to meet current compliance requirements. [name] MD, MD 12/03/2019 8:18:17 AM This report has been signed electronically. Number of Addenda: 0 Note Initiated On: 12/03/2019 7:54 AM Scope Withdrawal Time: 0 hours 6 minutes 32 seconds  Total Procedure Duration: 0 hours 13 minutes 29 seconds  Estimated  Blood Loss:  Estimated blood loss: none.      [facility]

## 2019-12-03 NOTE — Anesthesia Procedure Notes (Signed)
Date/Time: 12/03/2019 7:56 AM Performed by: Cameron Ali, CRNA Pre-anesthesia Checklist: Patient identified, Emergency Drugs available, Suction available, Timeout performed and Patient being monitored Patient Re-evaluated:Patient Re-evaluated prior to induction Oxygen Delivery Method: Nasal cannula Placement Confirmation: positive ETCO2

## 2019-12-04 ENCOUNTER — Encounter: Payer: Self-pay | Admitting: Gastroenterology

## 2019-12-04 ENCOUNTER — Encounter: Payer: Self-pay | Admitting: *Deleted

## 2019-12-13 ENCOUNTER — Other Ambulatory Visit: Payer: Self-pay | Admitting: Internal Medicine

## 2019-12-16 NOTE — Progress Notes (Signed)
Cardiology Office Note  Date:  12/18/2019   ID:  [name], DOB [birth year], MRN [id number]  PCP:  [name], MD   Chief Complaint  Patient presents with  . Other    12 month follow up. Patient denies chest pain and SOB at this timeMeds reviewed verbally with patient.     HPI:  [name] is a 79 y.o. female with history of  symptomatic PVCs,  hypothyroidism, and  hyperlipidemia  who presents for follow-up of PVCs. 2.0 x 1.8 cm anterior mediastinal mass or lymph node on CT 2020 Last seen in clinic 2019 ryan dunn, Dr. Fletcher Anon in 2018  Husband with CVA 8 yrs ago She takes care of him Daughter helps at home  CT scan chest 01/2019 Unchanged 2.0 x 1.8 cm anterior mediastinal mass or lymph node.  CT chest 2019 1. Calcium Score 0 2.  Normal right dominant coronary arteries anterior mediastinum there is a 1.7 cm short axis structure which measures 50 HU on precontrast imaging, without definite postcontrast enhancement. This is favored to represent a mildly enlarged lymph node.   No palpitations  EKG personally reviewed by myself on todays visit NSr rate 57 no ST or T wave changes   10/2015 with chest pain and underwent a treadmill nuclear stress test which showed no evidence of ischemia with a low normal EF. hypertensive on presentation with hypertensive response to exercise.  started on metoprolol tartrate 25 mg twice daily.  However, she did not tolerate this medication secondary to fatigue.    A Holter was performed in 01/2016 and showed 5300 PVCs in 48 hours representing a 3% burden, occasional PACs, and short runs of SVT.    PMH:   has a past medical history of Arthritis, Hyperlipidemia, Hypothyroidism, and PVC's (premature ventricular contractions).  PSH:    Past Surgical History:  Procedure Laterality Date  . ABDOMINAL HYSTERECTOMY     In 30's  . BROW LIFT Bilateral 07/12/2017   Procedure: BLEPHAROPLASTY upper eyelid with excess skin;  Surgeon:  Karle Starch, MD;  Location: [place];  Service: Ophthalmology;  Laterality: Bilateral;  MAC  . COLONOSCOPY WITH PROPOFOL N/A 12/29/2015   Procedure: COLONOSCOPY WITH PROPOFOL;  Surgeon: Hulen Luster, MD;  Location: [facility] ENDOSCOPY;  Service: Gastroenterology;  Laterality: N/A;  . COLONOSCOPY WITH PROPOFOL N/A 12/03/2019   Procedure: COLONOSCOPY WITH BIOPSY;  Surgeon: [name], MD;  Location: [place];  Service: Endoscopy;  Laterality: N/A;  . POLYPECTOMY N/A 12/03/2019   Procedure: POLYPECTOMY;  Surgeon: [name], MD;  Location: [place];  Service: Endoscopy;  Laterality: N/A;  . PTOSIS REPAIR Bilateral 07/12/2017   Procedure: ptosis repair resect ex;  Surgeon: Karle Starch, MD;  Location: [place];  Service: Ophthalmology;  Laterality: Bilateral;  Requests early  . VAGINAL PROLAPSE REPAIR  2014   Dr. Enzo Bi    Current Outpatient Medications  Medication Sig Dispense Refill  . aspirin EC 81 MG tablet Take 81 mg by mouth daily.      . cholecalciferol (VITAMIN D) 1000 UNITS tablet Take 2,000 Units by mouth daily.     . Cranberry 425 MG CAPS Take by mouth daily.    Marland Kitchen diltiazem (CARDIZEM CD) 120 MG 24 hr capsule TAKE 1 CAPSULE BY MOUTH  DAILY 90 capsule 0  . diphenhydramine-acetaminophen (TYLENOL PM) 25-500 MG TABS tablet Take 1 tablet by mouth at bedtime as needed.    Marland Kitchen glucosamine-chondroitin ([employer]S GLUCOSAMINE-CHONDROITIN) 500-400 MG tablet  Take 1 tablet by mouth daily.     Marland Kitchen levothyroxine (SYNTHROID) 50 MCG tablet TAKE 1 TABLET BY MOUTH  DAILY 90 tablet 3  . Multiple Vitamins-Minerals (MULTIVITAMIN WITH MINERALS) tablet Take 1 tablet by mouth daily.      . Omega-3 Fatty Acids (FISH OIL) 1000 MG CPDR Take by mouth daily.    . rosuvastatin (CRESTOR) 10 MG tablet TAKE 1 TABLET BY MOUTH  DAILY 90 tablet 3  . telmisartan (MICARDIS) 20 MG tablet TAKE 1 TABLET (20 MG TOTAL) BY MOUTH DAILY. IN THE MORNING FOR BLOOD PRESSURE 90 tablet 1   No current  facility-administered medications for this visit.   Allergies:   Prednisone   Social History:  The patient  reports that she has quit smoking. Her smoking use included cigarettes. She quit after 1.00 year of use. She has never used smokeless tobacco. She reports that she does not drink alcohol or use drugs.   Family History:   family history includes Breast cancer in her mother; Cancer in her brother, brother, brother, sister, and sister; Diabetes in her brother, brother, brother, father, and mother; Heart attack in her brother and mother; Pancreatic cancer in her sister; Stroke in her father.   Review of Systems: Review of Systems  Constitutional: Negative.   HENT: Negative.   Respiratory: Negative.   Cardiovascular: Negative.   Gastrointestinal: Negative.   Musculoskeletal: Negative.   Neurological: Negative.   Psychiatric/Behavioral: Negative.   All other systems reviewed and are negative.   PHYSICAL EXAM: VS:  BP 140/70 (BP Location: Left Arm, Patient Position: Sitting, Cuff Size: Normal)   Pulse (!) 57   Ht _0  (1.702 m)   Wt 144 lb 4 oz (65.4 kg)   BMI 22.59 kg/m  , BMI Body mass index is 22.59 kg/m. Constitutional:  oriented to person, place, and time. No distress.  HENT:  Head: Grossly normal Eyes:  no discharge. No scleral icterus.  Neck: No JVD, no carotid bruits  Cardiovascular: Regular rate and rhythm, no murmurs appreciated Pulmonary/Chest: Clear to auscultation bilaterally, no wheezes or rails Abdominal: Soft.  no distension.  no tenderness.  Musculoskeletal: Normal range of motion Neurological:  normal muscle tone. Coordination normal. No atrophy Skin: Skin warm and dry Psychiatric: normal affect, pleasant   Recent Labs: 09/13/2019: ALT 23; BUN 18; Creatinine, Ser 0.87; Hemoglobin 13.1; Platelets 246.0; Potassium 4.5; Sodium 138; TSH 1.76    Lipid Panel Lab Results  Component Value Date   CHOL 177 02/05/2019   HDL 62.40 02/05/2019   LDLCALC 86  02/05/2019   TRIG 145.0 02/05/2019      Wt Readings from Last 3 Encounters:  12/18/19 144 lb 4 oz (65.4 kg)  12/03/19 142 lb 3.2 oz (64.5 kg)  10/31/19 144 lb 3.2 oz (65.4 kg)       ASSESSMENT AND PLAN:  Problem List Items Addressed This Visit      Cardiology Problems   Familial hyperlipidemia   Raynaud disease   Sinus bradycardia     Other   Chest pain    Other Visit Diagnoses    PVC (premature ventricular contraction)    -  Primary   Essential hypertension       Mediastinal mass         Mediastinal mass/lymph node 2 cm seen on prior CT scan last year, we have ordered repeat CT scan to follow as recommended by previous radiology guidelines Reports she is asymptomatic  Palpitations/PVCs Reports she is asymptomatic Comfortable to  stay on her diltiazem  Hyperlipidemia Tolerating statin  Adjustment disorder Taking care of husband, significant demands on her Talked about need to take care of herself, continue her exercise  Disposition:   F/U  12 months   Total encounter time more than 25 minutes  Greater than 50% was spent in counseling and coordination of care with the patient    Signed, Esmond Plants, M.D., Ph.D. [place], [place]

## 2019-12-18 ENCOUNTER — Ambulatory Visit (INDEPENDENT_AMBULATORY_CARE_PROVIDER_SITE_OTHER): Payer: Medicare Other | Admitting: Cardiovascular Disease

## 2019-12-18 ENCOUNTER — Encounter: Payer: Self-pay | Admitting: Cardiovascular Disease

## 2019-12-18 ENCOUNTER — Other Ambulatory Visit: Payer: Self-pay

## 2019-12-18 VITALS — BP 140/70 | HR 57 | Ht 67.0 in | Wt 144.2 lb

## 2019-12-18 DIAGNOSIS — I493 Ventricular premature depolarization: Secondary | ICD-10-CM

## 2019-12-18 DIAGNOSIS — R001 Bradycardia, unspecified: Secondary | ICD-10-CM

## 2019-12-18 DIAGNOSIS — E7849 Other hyperlipidemia: Secondary | ICD-10-CM

## 2019-12-18 DIAGNOSIS — I10 Essential (primary) hypertension: Secondary | ICD-10-CM | POA: Diagnosis not present

## 2019-12-18 DIAGNOSIS — R072 Precordial pain: Secondary | ICD-10-CM | POA: Diagnosis not present

## 2019-12-18 DIAGNOSIS — J9859 Other diseases of mediastinum, not elsewhere classified: Secondary | ICD-10-CM

## 2019-12-18 DIAGNOSIS — I7300 Raynaud's syndrome without gangrene: Secondary | ICD-10-CM

## 2019-12-18 NOTE — Patient Instructions (Addendum)
Medication Instructions:  No changes  If you need a refill on your cardiac medications before your next appointment, please call your pharmacy.    Lab work: No new labs needed   If you have labs (blood work) drawn today and your tests are completely normal, you will receive your results only by: Marland Kitchen MyChart Message (if you have MyChart) OR . A paper copy in the mail If you have any lab test that is abnormal or we need to change your treatment, we will call you to review the results.   Testing/Procedures: Chest CT scan WO contrast, Mediastinal mass 2 cm on CT 01/2019, follow up study Please call [phone] to schedule.    Follow-Up: At [facility], you and your health needs are our priority.  As part of our continuing mission to provide you with exceptional heart care, we have created designated Provider Care Teams.  These Care Teams include your primary Cardiologist (physician) and Advanced Practice Providers (APPs -  Physician Assistants and Nurse Practitioners) who all work together to provide you with the care you need, when you need it.  . You will need a follow up appointment in 12 months   . Providers on your designated Care Team:   . Murray Hodgkins, NP . Christell Faith, PA-C . Marrianne Mood, PA-C  Any Other Special Instructions Will Be Listed Below (If Applicable).  For educational health videos Log in to : [email or web address] Or : [email or web address], password : [email or web address]

## 2020-01-02 ENCOUNTER — Ambulatory Visit
Admission: RE | Admit: 2020-01-02 | Discharge: 2020-01-02 | Disposition: A | Discharge status: Home / Self Care | Payer: Medicare Other | Source: Ambulatory Visit | Attending: Cardiovascular Disease | Admitting: Cardiovascular Disease

## 2020-01-02 ENCOUNTER — Other Ambulatory Visit: Payer: Self-pay

## 2020-01-02 DIAGNOSIS — R918 Other nonspecific abnormal finding of lung field: Secondary | ICD-10-CM | POA: Diagnosis not present

## 2020-01-02 DIAGNOSIS — J9859 Other diseases of mediastinum, not elsewhere classified: Secondary | ICD-10-CM | POA: Insufficient documentation

## 2020-01-07 ENCOUNTER — Other Ambulatory Visit: Payer: Self-pay | Admitting: Physician Assistant

## 2020-01-14 ENCOUNTER — Other Ambulatory Visit: Payer: Self-pay | Admitting: Internal Medicine

## 2020-01-14 DIAGNOSIS — Z1231 Encounter for screening mammogram for malignant neoplasm of breast: Secondary | ICD-10-CM

## 2020-02-25 ENCOUNTER — Ambulatory Visit
Admission: RE | Admit: 2020-02-25 | Discharge: 2020-02-25 | Disposition: A | Discharge status: Home / Self Care | Payer: Medicare Other | Source: Ambulatory Visit | Attending: Internal Medicine | Admitting: Internal Medicine

## 2020-02-25 DIAGNOSIS — Z1231 Encounter for screening mammogram for malignant neoplasm of breast: Secondary | ICD-10-CM

## 2020-03-18 ENCOUNTER — Other Ambulatory Visit: Payer: Self-pay

## 2020-03-19 ENCOUNTER — Telehealth: Payer: Self-pay | Admitting: Gastroenterology

## 2020-03-19 NOTE — Telephone Encounter (Signed)
Jamaia's daughter called and she went from constipation to very loose bowel movements. Does she need a medication change? Please call patient or daughter.

## 2020-03-20 ENCOUNTER — Encounter: Payer: Self-pay | Admitting: Internal Medicine

## 2020-03-20 ENCOUNTER — Other Ambulatory Visit: Payer: Self-pay

## 2020-03-20 ENCOUNTER — Ambulatory Visit (INDEPENDENT_AMBULATORY_CARE_PROVIDER_SITE_OTHER): Payer: Medicare Other | Admitting: Internal Medicine

## 2020-03-20 VITALS — BP 140/82 | HR 57 | Temp 97.1°F | Resp 15 | Ht 67.0 in | Wt 145.4 lb

## 2020-03-20 DIAGNOSIS — R197 Diarrhea, unspecified: Secondary | ICD-10-CM

## 2020-03-20 DIAGNOSIS — E039 Hypothyroidism, unspecified: Secondary | ICD-10-CM

## 2020-03-20 DIAGNOSIS — E7849 Other hyperlipidemia: Secondary | ICD-10-CM

## 2020-03-20 DIAGNOSIS — R5383 Other fatigue: Secondary | ICD-10-CM

## 2020-03-20 DIAGNOSIS — R159 Full incontinence of feces: Secondary | ICD-10-CM

## 2020-03-20 DIAGNOSIS — R7303 Prediabetes: Secondary | ICD-10-CM | POA: Diagnosis not present

## 2020-03-20 DIAGNOSIS — I10 Essential (primary) hypertension: Secondary | ICD-10-CM

## 2020-03-20 LAB — COMPREHENSIVE METABOLIC PANEL WITH GFR
ALT: 26 U/L (ref 0–35)
AST: 30 U/L (ref 0–37)
Albumin: 4.3 g/dL (ref 3.5–5.2)
Alkaline Phosphatase: 43 U/L (ref 39–117)
BUN: 18 mg/dL (ref 6–23)
CO2: 33 meq/L — ABNORMAL HIGH (ref 19–32)
Calcium: 9.7 mg/dL (ref 8.4–10.5)
Chloride: 101 meq/L (ref 96–112)
Creatinine, Ser: 0.83 mg/dL (ref 0.40–1.20)
GFR: 66.34 mL/min
Glucose, Bld: 92 mg/dL (ref 70–99)
Potassium: 4.4 meq/L (ref 3.5–5.1)
Sodium: 139 meq/L (ref 135–145)
Total Bilirubin: 0.4 mg/dL (ref 0.2–1.2)
Total Protein: 7.4 g/dL (ref 6.0–8.3)

## 2020-03-20 LAB — CBC WITH DIFFERENTIAL/PLATELET
Basophils Absolute: 0 10*3/uL (ref 0.0–0.1)
Basophils Relative: 0.9 % (ref 0.0–3.0)
Eosinophils Absolute: 0.2 10*3/uL (ref 0.0–0.7)
Eosinophils Relative: 3.7 % (ref 0.0–5.0)
HCT: 37.7 % (ref 36.0–46.0)
Hemoglobin: 12.7 g/dL (ref 12.0–15.0)
Lymphocytes Relative: 25.5 % (ref 12.0–46.0)
Lymphs Abs: 1.3 10*3/uL (ref 0.7–4.0)
MCHC: 33.6 g/dL (ref 30.0–36.0)
MCV: 94.5 fl (ref 78.0–100.0)
Monocytes Absolute: 0.4 10*3/uL (ref 0.1–1.0)
Monocytes Relative: 7.7 % (ref 3.0–12.0)
Neutro Abs: 3.3 10*3/uL (ref 1.4–7.7)
Neutrophils Relative %: 62.2 % (ref 43.0–77.0)
Platelets: 242 10*3/uL (ref 150.0–400.0)
RBC: 3.99 Mil/uL (ref 3.87–5.11)
RDW: 13 % (ref 11.5–15.5)
WBC: 5.2 10*3/uL (ref 4.0–10.5)

## 2020-03-20 LAB — LIPID PANEL
Cholesterol: 192 mg/dL (ref 0–200)
HDL: 54 mg/dL (ref >39.00)
LDL Cholesterol: 104 mg/dL — ABNORMAL HIGH (ref 0–99)
NonHDL: 137.75
Total CHOL/HDL Ratio: 4
Triglycerides: 171 mg/dL — ABNORMAL HIGH (ref 0.0–149.0)
VLDL: 34.2 mg/dL (ref 0.0–40.0)

## 2020-03-20 LAB — TSH: TSH: 2.26 u[IU]/mL (ref 0.35–4.50)

## 2020-03-20 LAB — HEMOGLOBIN A1C: Hgb A1c MFr Bld: 5.9 % (ref 4.6–6.5)

## 2020-03-20 NOTE — Telephone Encounter (Signed)
Yes getting a GI panel is appropriate.

## 2020-03-20 NOTE — Progress Notes (Signed)
Subjective:  Patient ID: [name], female    DOB: 1941-01-04  Age: 79 y.o. MRN: [id number]  CC: The primary encounter diagnosis was Familial hyperlipidemia. Diagnoses of Acquired hypothyroidism, Prediabetes, Fatigue, unspecified type, Elimination disorder with fecal symptoms, and Essential hypertension were also pertinent to this visit.  HPI [name] presents for follow up on multiple chronic conditions including hyperlipidemai with aortic atherosclerosis,  hypertension,  hypothyroid , and change in bowel habits.   This visit occurred during the SARS-CoV-2 public health emergency.  Safety protocols were in place, including screening questions prior to the visit, additional usage of staff PPE, and extensive cleaning of exam room while observing appropriate contact time as indicated for disinfecting solutions.    Patient has received both doses of the available COVID 19 vaccine without complications.  Patient continues to mask when outside of the home except when walking in yard or at safe distances from others .  Patient denies any change in mood or development of unhealthy behaviors resuting from the pandemic's restriction of activities and socialization.    1) bowels still a problem.  Had colonoscopy by [facility].   Use of miralax helped.  But for the last 2 weeks having a soft stool that occurrs with each urination .  No incontinence .  Still having a solid stool every morning. Stopped miralax 2 weeks  Ago, not taking any other  bowel agents, and symptoms persist.    Appetite is increased.  Drinks water "all day long". But only 18 ounces. Has occasional yogurt,  Milk with cereal daily  Not new.  No recent abx no dietary changes.  Chews sugarless gum.   2) HTN:  Patient is taking her medications as prescribed and notes no adverse effects.  Home BP readings have been done about once per month  and are  generally < 138/84 .  She is avoiding added salt in her diet and walking regularly  about 3 times per week for exercise  .     Outpatient Medications Prior to Visit  Medication Sig Dispense Refill  . aspirin EC 81 MG tablet Take 81 mg by mouth daily.      . cholecalciferol (VITAMIN D) 1000 UNITS tablet Take 2,000 Units by mouth daily.     . Cranberry 425 MG CAPS Take by mouth daily.    Marland Kitchen diltiazem (CARDIZEM CD) 120 MG 24 hr capsule TAKE 1 CAPSULE BY MOUTH  DAILY 90 capsule 3  . diphenhydramine-acetaminophen (TYLENOL PM) 25-500 MG TABS tablet Take 1 tablet by mouth at bedtime as needed.    Marland Kitchen glucosamine-chondroitin ([facility] GLUCOSAMINE-CHONDROITIN) 500-400 MG tablet Take 1 tablet by mouth daily.     Marland Kitchen levothyroxine (SYNTHROID) 50 MCG tablet TAKE 1 TABLET BY MOUTH  DAILY 90 tablet 3  . Multiple Vitamins-Minerals (MULTIVITAMIN WITH MINERALS) tablet Take 1 tablet by mouth daily.      . Omega-3 Fatty Acids (FISH OIL) 1000 MG CPDR Take by mouth daily.    . rosuvastatin (CRESTOR) 10 MG tablet TAKE 1 TABLET BY MOUTH  DAILY 90 tablet 3  . telmisartan (MICARDIS) 20 MG tablet TAKE 1 TABLET (20 MG TOTAL) BY MOUTH DAILY. IN THE MORNING FOR BLOOD PRESSURE 90 tablet 1   No facility-administered medications prior to visit.    Review of Systems;  Patient denies headache, fevers, malaise, unintentional weight loss, skin rash, eye pain, sinus congestion and sinus pain, sore throat, dysphagia,  hemoptysis , cough, dyspnea, wheezing, chest pain, palpitations, orthopnea, edema, abdominal  pain, nausea, melena, diarrhea, constipation, flank pain, dysuria, hematuria, urinary  Frequency, nocturia, numbness, tingling, seizures,  Focal weakness, Loss of consciousness,  Tremor, insomnia, depression, anxiety, and suicidal ideation.      Objective:  BP 140/82 (BP Location: Left Arm, Patient Position: Sitting, Cuff Size: Normal)   Pulse (!) 57   Temp (!) 97.1 F (36.2 C) (Temporal)   Resp 15   Ht 5\' 7"  (1.702 m)   Wt 145 lb 6.4 oz (66 kg)   SpO2 99%   BMI 22.77 kg/m   BP Readings from Last 3  Encounters:  03/20/20 140/82  12/18/19 140/70  12/03/19 (!) 125/58    Wt Readings from Last 3 Encounters:  03/20/20 145 lb 6.4 oz (66 kg)  12/18/19 144 lb 4 oz (65.4 kg)  12/03/19 142 lb 3.2 oz (64.5 kg)    General appearance: alert, cooperative and appears stated age Ears: normal TM's and external ear canals both ears Throat: lips, mucosa, and tongue normal; teeth and gums normal Neck: no adenopathy, no carotid bruit, supple, symmetrical, trachea midline and thyroid not enlarged, symmetric, no tenderness/mass/nodules Back: symmetric, no curvature. ROM normal. No CVA tenderness. Lungs: clear to auscultation bilaterally Heart: regular rate and rhythm, S1, S2 normal, no murmur, click, rub or gallop Abdomen: soft, non-tender; bowel sounds normal; no masses,  no organomegaly Pulses: 2+ and symmetric Skin: Skin color, texture, turgor normal. No rashes or lesions Lymph nodes: Cervical, supraclavicular, and axillary nodes normal.  Lab Results  Component Value Date   HGBA1C 5.9 03/20/2020   HGBA1C 5.9 09/13/2019   HGBA1C 6.1 02/05/2019    Lab Results  Component Value Date   CREATININE 0.83 03/20/2020   CREATININE 0.87 09/13/2019   CREATININE 0.91 02/05/2019    Lab Results  Component Value Date   WBC 5.2 03/20/2020   HGB 12.7 03/20/2020   HCT 37.7 03/20/2020   PLT 242.0 03/20/2020   GLUCOSE 92 03/20/2020   CHOL 192 03/20/2020   TRIG 171.0 (H) 03/20/2020   HDL 54.00 03/20/2020   LDLDIRECT 85.0 03/06/2018   LDLCALC 104 (H) 03/20/2020   ALT 26 03/20/2020   AST 30 03/20/2020   NA 139 03/20/2020   K 4.4 03/20/2020   CL 101 03/20/2020   CREATININE 0.83 03/20/2020   BUN 18 03/20/2020   CO2 33 (H) 03/20/2020   TSH 2.26 03/20/2020   HGBA1C 5.9 03/20/2020   MICROALBUR <0.7 09/13/2019    MM 3D SCREEN BREAST BILATERAL  Result Date: 02/25/2020 CLINICAL DATA:  Screening. EXAM: DIGITAL SCREENING BILATERAL MAMMOGRAM WITH TOMO AND CAD COMPARISON:  Previous exam(s). ACR Breast  Density Category b: There are scattered areas of fibroglandular density. FINDINGS: There are no findings suspicious for malignancy. Images were processed with CAD. IMPRESSION: No mammographic evidence of malignancy. A result letter of this screening mammogram will be mailed directly to the patient. RECOMMENDATION: Screening mammogram in one year. (Code:SM-B-01Y) BI-RADS CATEGORY  1: Negative. Electronically Signed   By: Lillia Mountain M.D.   On: 02/25/2020 13:07    Assessment & Plan:   Problem List Items Addressed This Visit      Unprioritized   Acquired hypothyroidism    Thyroid function is WNL on current dose.  No current changes needed.    Lab Results  Component Value Date   TSH 2.26 03/20/2020         Relevant Orders   TSH (Completed)   Elimination disorder with fecal symptoms    Constipation has resolved.  Results of Colonoscopy  reviewed.  Dietary habits. Reviewed  Advised to suspend cow's milk and sugarless gum for 2 weeks to see if soft stools resolve      Essential hypertension    Well controlled on current regimen. Renal function stable, no changes today.  Lab Results  Component Value Date   CREATININE 0.83 03/20/2020   Lab Results  Component Value Date   NA 139 03/20/2020   K 4.4 03/20/2020   CL 101 03/20/2020   CO2 33 (H) 03/20/2020         Familial hyperlipidemia - Primary    Managed with crestor,  LDL is 104 .  lfts normal.  No changes today   Lab Results  Component Value Date   CHOL 192 03/20/2020   HDL 54.00 03/20/2020   LDLCALC 104 (H) 03/20/2020   LDLDIRECT 85.0 03/06/2018   TRIG 171.0 (H) 03/20/2020   CHOLHDL 4 03/20/2020   Lab Results  Component Value Date   ALT 26 03/20/2020   AST 30 03/20/2020   ALKPHOS 43 03/20/2020   BILITOT 0.4 03/20/2020          Relevant Orders   Lipid panel (Completed)   Prediabetes    Her  a1c has improved with adherence to low glycemic index diet.    Lab Results  Component Value Date   HGBA1C 5.9  03/20/2020         Relevant Orders   Hemoglobin A1c (Completed)   Comprehensive metabolic panel (Completed)    Other Visit Diagnoses    Fatigue, unspecified type       Relevant Orders   CBC with Differential/Platelet (Completed)     I provided  30 minutes of  face-to-face time during this encounter reviewing patient's current problems and past surgeries, labs and imaging studies, providing counseling on the above mentioned problems , and coordination  of care .  I am having [name] maintain her aspirin EC, multivitamin with minerals, glucosamine-chondroitin, cholecalciferol, Fish Oil, diphenhydramine-acetaminophen, Cranberry, rosuvastatin, levothyroxine, telmisartan, and diltiazem.  No orders of the defined types were placed in this encounter.   There are no discontinued medications.  Follow-up: Return in about 6 months (around 09/19/2020).   [name], MD

## 2020-03-20 NOTE — Telephone Encounter (Signed)
Spoke with pt's daughter and she has stated her mother is having diarrhea every time she urinates. I have ordered a GI profile to check for infection. Is this okay?

## 2020-03-20 NOTE — Patient Instructions (Addendum)
Give up regular milk and sugarless gum for one week  You can use Lactaid which is still cow's milk without the lactose   If the stools become liquid  We will run tests    The new goals for optimal blood pressure management are 130/80 or less.  Please check your blood pressure a few times at home and send me the readings so I can determine if you need a change in medication

## 2020-03-21 DIAGNOSIS — R197 Diarrhea, unspecified: Secondary | ICD-10-CM | POA: Diagnosis not present

## 2020-03-22 ENCOUNTER — Encounter: Payer: Self-pay | Admitting: Internal Medicine

## 2020-03-22 DIAGNOSIS — I10 Essential (primary) hypertension: Secondary | ICD-10-CM | POA: Insufficient documentation

## 2020-03-22 DIAGNOSIS — R159 Full incontinence of feces: Secondary | ICD-10-CM | POA: Insufficient documentation

## 2020-03-22 NOTE — Assessment & Plan Note (Signed)
Managed with crestor,  LDL is 104 .  lfts normal.  No changes today   Lab Results  Component Value Date   CHOL 192 03/20/2020   HDL 54.00 03/20/2020   LDLCALC 104 (H) 03/20/2020   LDLDIRECT 85.0 03/06/2018   TRIG 171.0 (H) 03/20/2020   CHOLHDL 4 03/20/2020   Lab Results  Component Value Date   ALT 26 03/20/2020   AST 30 03/20/2020   ALKPHOS 43 03/20/2020   BILITOT 0.4 03/20/2020

## 2020-03-22 NOTE — Assessment & Plan Note (Addendum)
Constipation has resolved.  Results of Colonoscopy reviewed.  Dietary habits. Reviewed  Advised to suspend cow's milk and sugarless gum for 2 weeks to see if soft stools resolve

## 2020-03-22 NOTE — Assessment & Plan Note (Signed)
Thyroid function is WNL on current dose.  No current changes needed.    Lab Results  Component Value Date   TSH 2.26 03/20/2020

## 2020-03-22 NOTE — Assessment & Plan Note (Signed)
Well controlled on current regimen. Renal function stable, no changes today.  Lab Results  Component Value Date   CREATININE 0.83 03/20/2020   Lab Results  Component Value Date   NA 139 03/20/2020   K 4.4 03/20/2020   CL 101 03/20/2020   CO2 33 (H) 03/20/2020

## 2020-03-22 NOTE — Assessment & Plan Note (Signed)
Her  a1c has improved with adherence to low glycemic index diet.    Lab Results  Component Value Date   HGBA1C 5.9 03/20/2020

## 2020-03-23 LAB — GI PROFILE, STOOL, PCR

## 2020-03-24 ENCOUNTER — Telehealth: Payer: Self-pay

## 2020-03-24 NOTE — Telephone Encounter (Signed)
Pt notified of lab result via mychart. 

## 2020-03-24 NOTE — Telephone Encounter (Signed)
-----   Message from [name], MD sent at 03/24/2020  1:10 PM EDT ----- Let the patient know that the stool tests were negative for any infection.

## 2020-04-22 ENCOUNTER — Other Ambulatory Visit: Payer: Self-pay | Admitting: Cardiovascular Disease

## 2020-04-22 MED ORDER — DILTIAZEM HCL ER COATED BEADS 120 MG PO CP24: 120.0000 mg | ORAL_CAPSULE | Freq: Every day | ORAL | 1 refills | Status: DC

## 2020-04-22 NOTE — Telephone Encounter (Signed)
°*  STAT* If patient is at the pharmacy, call can be transferred to refill team.   1. Which medications need to be refilled? (please list name of each medication and dose if known) diltiazem 120 MG 1 capsule daily  2. Which pharmacy/location (including street and city if local pharmacy) is medication to be sent to? [facility] [place] - [facility] uses [facility] order but they are out of stock of medication so needs to use drug store   3. Do they need a 30 day or 90 day supply? 90 day

## 2020-04-22 NOTE — Telephone Encounter (Signed)
Requested Prescriptions   Signed Prescriptions Disp Refills  . diltiazem (CARDIZEM CD) 120 MG 24 hr capsule 90 capsule 1    Sig: Take 1 capsule (120 mg total) by mouth daily.    Authorizing Provider: GOLLAN, TIMOTHY J    Ordering User: NEWCOMER MCCLAIN, Zianne Schubring L    

## 2020-04-28 DIAGNOSIS — M25561 Pain in right knee: Secondary | ICD-10-CM | POA: Diagnosis not present

## 2020-04-28 DIAGNOSIS — M7631 Iliotibial band syndrome, right leg: Secondary | ICD-10-CM | POA: Diagnosis not present

## 2020-04-28 DIAGNOSIS — M1711 Unilateral primary osteoarthritis, right knee: Secondary | ICD-10-CM | POA: Diagnosis not present

## 2020-06-09 ENCOUNTER — Other Ambulatory Visit: Payer: Self-pay | Admitting: Internal Medicine

## 2020-06-12 ENCOUNTER — Other Ambulatory Visit: Payer: Self-pay | Admitting: Internal Medicine

## 2020-08-12 ENCOUNTER — Ambulatory Visit (INDEPENDENT_AMBULATORY_CARE_PROVIDER_SITE_OTHER): Payer: Medicare Other

## 2020-08-12 VITALS — Ht 67.0 in | Wt 145.0 lb

## 2020-08-12 DIAGNOSIS — Z0000 Encounter for general adult medical examination without abnormal findings: Secondary | ICD-10-CM | POA: Diagnosis not present

## 2020-08-12 NOTE — Progress Notes (Addendum)
Subjective:   [name] is a 79 y.o. female who presents for Medicare Annual (Subsequent) preventive examination.  Review of Systems    No ROS.  Medicare Wellness Virtual Visit.   Cardiac Risk Factors include: advanced age (>57mn, >>64women);hypertension     Objective:    Today's Vitals   08/12/20 1235  Weight: 145 lb (65.8 kg)  Height: _0  (1.702 m)   Body mass index is 22.71 kg/m.  Advanced Directives 08/12/2020 12/03/2019 08/10/2019 07/31/2018 07/30/2018 07/29/2017 07/12/2017  Does Patient Have a Medical Advance Directive? _1  No No  Type of Advance Directive - - - - - - -  Does patient want to make changes to medical advance directive? - No - Patient declined No - Patient declined Yes (MAU/Ambulatory/Procedural Areas - Information given) - - -  Copy of [place] Chart? - - - - - - -  Would patient like information on creating a medical advance directive? No - Patient declined - - - - No - Patient declined No - Patient declined    Current Medications (verified) Outpatient Encounter Medications as of 08/12/2020  Medication Sig  . aspirin EC 81 MG tablet Take 81 mg by mouth daily.    . cholecalciferol (VITAMIN D) 1000 UNITS tablet Take 2,000 Units by mouth daily.   . Cranberry 425 MG CAPS Take by mouth daily.  .Marland Kitchendiltiazem (CARDIZEM CD) 120 MG 24 hr capsule Take 1 capsule (120 mg total) by mouth daily.  . diphenhydramine-acetaminophen (TYLENOL PM) 25-500 MG TABS tablet Take 1 tablet by mouth at bedtime as needed.  .Marland Kitchenglucosamine-chondroitin ([facility] GLUCOSAMINE-CHONDROITIN) 500-400 MG tablet Take 1 tablet by mouth daily.   .Marland Kitchenlevothyroxine (SYNTHROID) 50 MCG tablet TAKE 1 TABLET BY MOUTH  DAILY  . Multiple Vitamins-Minerals (MULTIVITAMIN WITH MINERALS) tablet Take 1 tablet by mouth daily.    . Omega-3 Fatty Acids (FISH OIL) 1000 MG CPDR Take by mouth daily.  . rosuvastatin (CRESTOR) 10 MG tablet TAKE 1 TABLET BY MOUTH  DAILY  . telmisartan  (MICARDIS) 20 MG tablet TAKE 1 TABLET (20 MG TOTAL) BY MOUTH DAILY. IN THE MORNING FOR BLOOD PRESSURE   No facility-administered encounter medications on file as of 08/12/2020.    Allergies (verified) Prednisone   History: Past Medical History:  Diagnosis Date  . Arthritis    knees  . Hyperlipidemia   . Hypothyroidism   . PVC's (premature ventricular contractions)    a.  48-hour Holter in 3/17 demonstrated NSR, frequent PVCs totaling 5300 beats representing 3% burden, occasional PACs, short runs of SVT   Past Surgical History:  Procedure Laterality Date  . ABDOMINAL HYSTERECTOMY     In 30's  . BROW LIFT Bilateral 07/12/2017   Procedure: BLEPHAROPLASTY upper eyelid with excess skin;  Surgeon: FKarle Starch MD;  [facility] [facility]  Service: Ophthalmology;  Laterality: Bilateral;  MAC  . COLONOSCOPY WITH PROPOFOL N/A 12/29/2015   Procedure: COLONOSCOPY WITH PROPOFOL;  Surgeon: PHulen Luster MD;  [facility]  Service: Gastroenterology;  Laterality: N/A;  . COLONOSCOPY WITH PROPOFOL N/A 12/03/2019   Procedure: COLONOSCOPY WITH BIOPSY;  Surgeon: WLucilla Lame MD;  [facility]  Service: Endoscopy;  Laterality: N/A;  . POLYPECTOMY N/A 12/03/2019   Procedure: POLYPECTOMY;  Surgeon: WLucilla Lame MD;  [place] [facility]  Service: Endoscopy;  Laterality: N/A;  . PTOSIS REPAIR Bilateral 07/12/2017   Procedure: ptosis repair resect ex;  Surgeon: FVickki Muff  Philis Pique, MD;  Location: [place];  Service: Ophthalmology;  Laterality: Bilateral;  Requests early  . VAGINAL PROLAPSE REPAIR  2014   Dr. Enzo Bi   Family History  Problem Relation Age of Onset  . Breast cancer Mother        2's  . Heart attack Mother   . Diabetes Mother   . Stroke Father   . Diabetes Father   . Pancreatic cancer Sister   . Cancer Sister        pancreatic  . Cancer Brother        liver  . Diabetes Brother   . Cancer Sister        anal  . Breast cancer  Sister   . Heart attack Brother   . Diabetes Brother   . Cancer Brother        stomach  . Cancer Brother   . Diabetes Brother   . Colon cancer Neg Hx    Social History   Socioeconomic History  . Marital status: Married    Spouse name: Not on file  . Number of children: 2  . Years of education: Not on file  . Highest education level: Not on file  Occupational History  . Occupation: Retired Education officer, museum  Tobacco Use  . Smoking status: Former Smoker    Years: 1.00    Types: Cigarettes  . Smokeless tobacco: Never Used  . Tobacco comment: Smoked x 1 year in her 49's  Vaping Use  . Vaping Use: Never used  Substance and Sexual Activity  . Alcohol use: No  . Drug use: No  . Sexual activity: Never  Other Topics Concern  . Not on file  Social History Narrative   Daily Caffeine Use:  One cup coffee in am    Regular Exercise -  5 days a week x 1 hour - gym, aerobic and wt training         Social Determinants of Health   Financial Resource Strain: Low Risk   . Difficulty of Paying Living Expenses: Not hard at all  Food Insecurity: No Food Insecurity  . Worried About Charity fundraiser in the Last Year: Never true  . Ran Out of Food in the Last Year: Never true  Transportation Needs: No Transportation Needs  . Lack of Transportation (Medical): No  . Lack of Transportation (Non-Medical): No  Physical Activity: Sufficiently Active  . Days of Exercise per Week: 5 days  . Minutes of Exercise per Session: 60 min  Stress: No Stress Concern Present  . Feeling of Stress : Not at all  Social Connections: Unknown  . Frequency of Communication with Friends and Family: More than three times a week  . Frequency of Social Gatherings with Friends and Family: Three times a week  . Attends Religious Services: Not on file  . Active Member of Clubs or Organizations: Not on file  . Attends Archivist Meetings: Not on file  . Marital Status: Married    Tobacco  Counseling Counseling given: Not Answered Comment: Smoked x 1 year in her 20's   Clinical Intake:  Pre-visit preparation completed: Yes        Diabetes: No  How often do you need to have someone help you when you read instructions, pamphlets, or other written materials from your doctor or pharmacy?: 1 - Never       Activities of Daily Living In your present state of health, do you have any difficulty  performing the following activities: 08/12/2020 12/03/2019  Hearing? N N  Vision? N N  Difficulty concentrating or making decisions? N N  Walking or climbing stairs? N N  Dressing or bathing? N N  Doing errands, shopping? N -  Preparing Food and eating ? N -  Using the Toilet? N -  In the past six months, have you accidently leaked urine? Y -  Comment Managed with daily liner -  Do you have problems with loss of bowel control? N -  Managing your Medications? N -  Managing your Finances? N -  Housekeeping or managing your Housekeeping? N -  Some recent data might be hidden    Patient Care Team: [name], MD as PCP - General (Internal Medicine)  Indicate any recent Medical Services you may have received from other than [facility] providers in the past year (date may be approximate).     Assessment:   This is a routine wellness examination for Markisha.  I connected with Lorraine today by telephone and verified that I am speaking with the correct person using two identifiers. Location patient: home Location provider: work Persons participating in the virtual visit: patient, Marine scientist.    I discussed the limitations, risks, security and privacy concerns of performing an evaluation and management service by telephone and the availability of in person appointments. The patient expressed understanding and verbally consented to this telephonic visit.    Interactive audio and video telecommunications were attempted between this provider and patient, however failed, due to patient  having technical difficulties OR patient did not have access to video capability.  We continued and completed visit with audio only.  Some vital signs may be absent or patient reported.   Hearing/Vision screen  Hearing Screening   _0  _1  _2  _3  _4  _5  _6  _7  _8   Right ear:           Left ear:           Comments: Patient is able to hear conversational tones without difficulty. No issues reported.  Vision Screening Comments: Followed [facility]  Wears corrective lenses  Lid lift surgery  Annual visits Cataract extraction, bilateral  Virtual visit  Dietary issues and exercise activities discussed: Current Exercise Habits: Home exercise routine, Intensity: Mild Healthy diet Good water intake  Goals    . Follow up with pcp as needed    . Healthy diet      Depression Screen PHQ 2/9 Scores 08/12/2020 08/10/2019 07/31/2018 07/29/2017 07/27/2016 07/25/2015 12/07/2013  PHQ - 2 Score 0 0 0 0 0 0 0  PHQ- 9 Score - - - 0 - - -    Fall Risk Fall Risk  08/12/2020 03/20/2020 09/13/2019 08/10/2019 11/01/2018  Falls in the past year? 0 0 0 0 0  Number falls in past yr: 0 - - - -  Injury with Fall? 0 - - - -  Follow up Falls evaluation completed Falls evaluation completed Falls evaluation completed - -   Handrails in use when climbing stairs? Yes Home free of loose throw rugs in walkways, pet beds, electrical cords, etc? Yes  Adequate lighting in your home to reduce risk of falls? Yes   ASSISTIVE DEVICES UTILIZED TO PREVENT FALLS: Life alert? Yes  Use of a cane, walker or w/c? No  Grab bars in the bathroom? Yes  Shower chair or bench in shower? Yes  Elevated toilet seat or a handicapped toilet? Yes   TIMED UP AND GO:  Was the  test performed? No . Virtual visit.   Cognitive Function: Patient is alert and oriented x3.  Denies difficulty focusing, making decisions, memory loss.  Enjoys brain challenging games, reading and discussion for brain health.    MMSE - Mini Mental State Exam 07/31/2018 07/29/2017 07/27/2016 07/25/2015  Orientation to time _0 Orientation to Place _1 Registration _2 Attention/ Calculation _3 Recall _4 Language- name 2 objects _5 Language- repeat _6 Language- follow 3 step command _7 Language- read & follow direction _8 Write a sentence _9 Copy design _10 Total score _11 6CIT Screen 08/10/2019  What Year? 0 points  What month? 0 points  What time? 0 points  Count back from 20 0 points  Months in reverse 0 points  Repeat phrase 0 points  Total Score 0    Immunizations Immunization History  Administered Date(s) Administered  . Influenza Split 08/18/2011, 08/04/2012  . Influenza Whole 09/10/2013  . Influenza, High Dose Seasonal PF 07/25/2015, 07/27/2016, 07/29/2017, 07/31/2018, 08/09/2019  . Influenza,inj,Quad PF,6+ Mos 09/06/2014  . Influenza,inj,quad, With Preservative 07/25/2018  . PFIZER SARS-COV-2 Vaccination 12/25/2019, 01/15/2020  . Pneumococcal Conjugate-13 12/07/2013  . Pneumococcal Polysaccharide-23 12/18/2007, 03/06/2018  . Tdap 11/20/2012, 09/29/2016  . Zoster 12/18/2007  . Zoster Recombinat (Shingrix) 09/24/2018, 12/06/2018    Health Maintenance Health Maintenance  Topic Date Due  . Hepatitis C Screening  Never done  . INFLUENZA VACCINE  06/22/2020  . MAMMOGRAM  02/24/2021  . TETANUS/TDAP  09/29/2026  . DEXA SCAN  Completed  . COVID-19 Vaccine  Completed  . PNA vac Low Risk Adult  Completed     Dental Screening: Recommended annual dental exams for proper oral hygiene  Community Resource Referral / Chronic Care Management: CRR required this visit?  No   CCM required this visit?  No      Plan:   Keep all routine maintenance appointments.   Follow up 09/24/20 @ 10:00  I have personally reviewed and noted the following in the patient's chart:   . Medical and social history . Use of alcohol, tobacco  or illicit drugs  . Current medications and supplements . Functional ability and status . Nutritional status . Physical activity . Advanced directives . List of other physicians . Hospitalizations, surgeries, and ER visits in previous 12 months . Vitals . Screenings to include cognitive, depression, and falls . Referrals and appointments  In addition, I have reviewed and discussed with patient certain preventive protocols, quality metrics, and best practice recommendations. A written personalized care plan for preventive services as well as general preventive health recommendations were provided to patient via mychart.     OBrien-Blaney, Jaquanna Ballentine L, LPN   11/24/1592    I have reviewed the above information and agree with above.   Deborra Medina, MD

## 2020-08-12 NOTE — Patient Instructions (Addendum)
[name] , Thank you for taking time to come for your Medicare Wellness Visit. I appreciate your ongoing commitment to your health goals. Please review the following plan we discussed and let me know if I can assist you in the future.   These are the goals we discussed: Goals     Follow up with pcp as needed     Healthy diet       This is a list of the screening recommended for you and due dates:  Health Maintenance  Topic Date Due    Hepatitis C: One time screening is recommended by Center for Disease Control  (CDC) for  adults born from [birth year] through 1965.   Never done   Flu Shot  06/22/2020   Mammogram  02/24/2021   Tetanus Vaccine  09/29/2026   DEXA scan (bone density measurement)  Completed   COVID-19 Vaccine  Completed   Pneumonia vaccines  Completed    Immunizations Immunization History  Administered Date(s) Administered   Influenza Split 08/18/2011, 08/04/2012   Influenza Whole 09/10/2013   Influenza, High Dose Seasonal PF 07/25/2015, 07/27/2016, 07/29/2017, 07/31/2018, 08/09/2019   Influenza,inj,Quad PF,6+ Mos 09/06/2014   Influenza,inj,quad, With Preservative 07/25/2018   PFIZER SARS-COV-2 Vaccination 12/25/2019, 01/15/2020   Pneumococcal Conjugate-13 12/07/2013   Pneumococcal Polysaccharide-23 12/18/2007, 03/06/2018   Tdap 11/20/2012, 09/29/2016   Zoster 12/18/2007   Zoster Recombinat (Shingrix) 09/24/2018, 12/06/2018   Keep all routine maintenance appointments.   Follow up 09/24/20 @ 10:00  Advanced directives: not yet completed.   Conditions/risks identified: none new.  Follow up in one year for your annual wellness visit.   Preventive Care 79 Years and Older, Female Preventive care refers to lifestyle choices and visits with your health care provider that can promote health and wellness. What does preventive care include?  A yearly physical exam. This is also called an annual well check.  Dental exams once or twice a  year.  Routine eye exams. Ask your health care provider how often you should have your eyes checked.  Personal lifestyle choices, including:  Daily care of your teeth and gums.  Regular physical activity.  Eating a healthy diet.  Avoiding tobacco and drug use.  Limiting alcohol use.  Practicing safe sex.  Taking low-dose aspirin every day.  Taking vitamin and mineral supplements as recommended by your health care provider. What happens during an annual well check? The services and screenings done by your health care provider during your annual well check will depend on your age, overall health, lifestyle risk factors, and family history of disease. Counseling  Your health care provider may ask you questions about your:  Alcohol use.  Tobacco use.  Drug use.  Emotional well-being.  Home and relationship well-being.  Sexual activity.  Eating habits.  History of falls.  Memory and ability to understand (cognition).  Work and work Statistician.  Reproductive health. Screening  You may have the following tests or measurements:  Height, weight, and BMI.  Blood pressure.  Lipid and cholesterol levels. These may be checked every 5 years, or more frequently if you are over 29 years old.  Skin check.  Lung cancer screening. You may have this screening every year starting at age 65 if you have a 30-pack-year history of smoking and currently smoke or have quit within the past 15 years.  Fecal occult blood test (FOBT) of the stool. You may have this test every year starting at age 81.  Flexible sigmoidoscopy or colonoscopy. You may have  a sigmoidoscopy every 5 years or a colonoscopy every 10 years starting at age 25.  Hepatitis C blood test.  Hepatitis B blood test.  Sexually transmitted disease (STD) testing.  Diabetes screening. This is done by checking your blood sugar (glucose) after you have not eaten for a while (fasting). You may have this done every 1-3  years.  Bone density scan. This is done to screen for osteoporosis. You may have this done starting at age 56.  Mammogram. This may be done every 1-2 years. Talk to your health care provider about how often you should have regular mammograms. Talk with your health care provider about your test results, treatment options, and if necessary, the need for more tests. Vaccines  Your health care provider may recommend certain vaccines, such as:  Influenza vaccine. This is recommended every year.  Tetanus, diphtheria, and acellular pertussis (Tdap, Td) vaccine. You may need a Td booster every 10 years.  Zoster vaccine. You may need this after age 76.  Pneumococcal 13-valent conjugate (PCV13) vaccine. One dose is recommended after age 62.  Pneumococcal polysaccharide (PPSV23) vaccine. One dose is recommended after age 25. Talk to your health care provider about which screenings and vaccines you need and how often you need them. This information is not intended to replace advice given to you by your health care provider. Make sure you discuss any questions you have with your health care provider. Document Released: 12/05/2015 Document Revised: 07/28/2016 Document Reviewed: 09/09/2015 Elsevier Interactive Patient Education  2017 [place] Prevention in the Home Falls can cause injuries. They can happen to people of all ages. There are many things you can do to make your home safe and to help prevent falls. What can I do on the outside of my home?  Regularly fix the edges of walkways and driveways and fix any cracks.  Remove anything that might make you trip as you walk through a door, such as a raised step or threshold.  Trim any bushes or trees on the path to your home.  Use bright outdoor lighting.  Clear any walking paths of anything that might make someone trip, such as rocks or tools.  Regularly check to see if handrails are loose or broken. Make sure that both sides of any  steps have handrails.  Any raised decks and porches should have guardrails on the edges.  Have any leaves, snow, or ice cleared regularly.  Use sand or salt on walking paths during winter.  Clean up any spills in your garage right away. This includes oil or grease spills. What can I do in the bathroom?  Use night lights.  Install grab bars by the toilet and in the tub and shower. Do not use towel bars as grab bars.  Use non-skid mats or decals in the tub or shower.  If you need to sit down in the shower, use a plastic, non-slip stool.  Keep the floor dry. Clean up any water that spills on the floor as soon as it happens.  Remove soap buildup in the tub or shower regularly.  Attach bath mats securely with double-sided non-slip rug tape.  Do not have throw rugs and other things on the floor that can make you trip. What can I do in the bedroom?  Use night lights.  Make sure that you have a light by your bed that is easy to reach.  Do not use any sheets or blankets that are too big for your bed.  They should not hang down onto the floor.  Have a firm chair that has side arms. You can use this for support while you get dressed.  Do not have throw rugs and other things on the floor that can make you trip. What can I do in the kitchen?  Clean up any spills right away.  Avoid walking on wet floors.  Keep items that you use a lot in easy-to-reach places.  If you need to reach something above you, use a strong step stool that has a grab bar.  Keep electrical cords out of the way.  Do not use floor polish or wax that makes floors slippery. If you must use wax, use non-skid floor wax.  Do not have throw rugs and other things on the floor that can make you trip. What can I do with my stairs?  Do not leave any items on the stairs.  Make sure that there are handrails on both sides of the stairs and use them. Fix handrails that are broken or loose. Make sure that handrails are  as long as the stairways.  Check any carpeting to make sure that it is firmly attached to the stairs. Fix any carpet that is loose or worn.  Avoid having throw rugs at the top or bottom of the stairs. If you do have throw rugs, attach them to the floor with carpet tape.  Make sure that you have a light switch at the top of the stairs and the bottom of the stairs. If you do not have them, ask someone to add them for you. What else can I do to help prevent falls?  Wear shoes that:  Do not have high heels.  Have rubber bottoms.  Are comfortable and fit you well.  Are closed at the toe. Do not wear sandals.  If you use a stepladder:  Make sure that it is fully opened. Do not climb a closed stepladder.  Make sure that both sides of the stepladder are locked into place.  Ask someone to hold it for you, if possible.  Clearly mark and make sure that you can see:  Any grab bars or handrails.  First and last steps.  Where the edge of each step is.  Use tools that help you move around (mobility aids) if they are needed. These include:  Canes.  Walkers.  Scooters.  Crutches.  Turn on the lights when you go into a dark area. Replace any light bulbs as soon as they burn out.  Set up your furniture so you have a clear path. Avoid moving your furniture around.  If any of your floors are uneven, fix them.  If there are any pets around you, be aware of where they are.  Review your medicines with your doctor. Some medicines can make you feel dizzy. This can increase your chance of falling. Ask your doctor what other things that you can do to help prevent falls. This information is not intended to replace advice given to you by your health care provider. Make sure you discuss any questions you have with your health care provider. Document Released: 09/04/2009 Document Revised: 04/15/2016 Document Reviewed: 12/13/2014 Elsevier Interactive Patient Education  2017 [employer].

## 2020-08-21 DIAGNOSIS — Z961 Presence of intraocular lens: Secondary | ICD-10-CM | POA: Diagnosis not present

## 2020-08-26 LAB — HM HEPATITIS C SCREENING LAB: HM Hepatitis Screen: NEGATIVE

## 2020-09-04 ENCOUNTER — Ambulatory Visit: Payer: Medicare Other | Admitting: Dermatology

## 2020-09-04 ENCOUNTER — Other Ambulatory Visit: Payer: Self-pay

## 2020-09-04 DIAGNOSIS — D2272 Melanocytic nevi of left lower limb, including hip: Secondary | ICD-10-CM

## 2020-09-04 DIAGNOSIS — Q828 Other specified congenital malformations of skin: Secondary | ICD-10-CM | POA: Diagnosis not present

## 2020-09-04 DIAGNOSIS — D485 Neoplasm of uncertain behavior of skin: Secondary | ICD-10-CM

## 2020-09-04 DIAGNOSIS — D1800 Hemangioma unspecified site: Secondary | ICD-10-CM

## 2020-09-04 DIAGNOSIS — I781 Nevus, non-neoplastic: Secondary | ICD-10-CM

## 2020-09-04 DIAGNOSIS — D229 Melanocytic nevi, unspecified: Secondary | ICD-10-CM | POA: Diagnosis not present

## 2020-09-04 DIAGNOSIS — L570 Actinic keratosis: Secondary | ICD-10-CM | POA: Diagnosis not present

## 2020-09-04 DIAGNOSIS — Z85828 Personal history of other malignant neoplasm of skin: Secondary | ICD-10-CM

## 2020-09-04 DIAGNOSIS — L820 Inflamed seborrheic keratosis: Secondary | ICD-10-CM

## 2020-09-04 DIAGNOSIS — L821 Other seborrheic keratosis: Secondary | ICD-10-CM

## 2020-09-04 NOTE — Patient Instructions (Addendum)
Cryotherapy Aftercare  . Wash gently with soap and water everyday.   . Apply Vaseline and Band-Aid daily until healed.  Prior to procedure, discussed risks of blister formation, small wound, skin dyspigmentation, or rare scar following cryotherapy.   Melanoma ABCDEs  Melanoma is the most dangerous type of skin cancer, and is the leading cause of death from skin disease.  You are more likely to develop melanoma if you:  Have light-colored skin, light-colored eyes, or red or blond hair  Spend a lot of time in the sun  Tan regularly, either outdoors or in a tanning bed  Have had blistering sunburns, especially during childhood  Have a close family member who has had a melanoma  Have atypical moles or large birthmarks  Early detection of melanoma is key since treatment is typically straightforward and cure rates are extremely high if we catch it early.   The first sign of melanoma is often a change in a mole or a new dark spot.  The ABCDE system is a way of remembering the signs of melanoma.  A for asymmetry:  The two halves do not match. B for border:  The edges of the growth are irregular. C for color:  A mixture of colors are present instead of an even brown color. D for diameter:  Melanomas are usually (but not always) greater than 6mm - the size of a pencil eraser. E for evolution:  The spot keeps changing in size, shape, and color.  Please check your skin once per month between visits. You can use a small mirror in front and a large mirror behind you to keep an eye on the back side or your body.   If you see any new or changing lesions before your next follow-up, please call to schedule a visit.  Please continue daily skin protection including broad spectrum sunscreen SPF 30+ to sun-exposed areas, reapplying every 2 hours as needed when you're outdoors.   

## 2020-09-04 NOTE — Progress Notes (Signed)
Follow-Up Visit   Subjective  [name] is a 79 y.o. female who presents for the following: lesions (Patient has a few spots she would like checked. ).  Patient with a spot at left ankle, present for 5 or 6 years that itches, rough feeling. Spot at back .that patient just noticed. Also has some spots at face, neck that itch. One at left face oozes. Patient advises she has a h/o skin cancer over 20 years ago treated by Dr. [name].   The following portions of the chart were reviewed this encounter and updated as appropriate:  Tobacco  Allergies  Meds  Problems  Med Hx  Surg Hx  Fam Hx      Review of Systems:  No other skin or systemic complaints except as noted in HPI or Assessment and Plan.  Objective  Well appearing patient in no apparent distress; mood and affect are within normal limits.  A focused examination was performed including legs, back, face, lips, eyelids, neck, arms. Relevant physical exam findings are noted in the Assessment and Plan.  Objective  Left medial ankle x 1, mid neck x 1 (2): Erythematous keratotic or waxy stuck-on papule or plaque.   Objective  Left Lower Leg: L med calf superior 0.4cm dark brown thin papule L med calf inferior 0.3cm dark brown thin papule  Images      Objective  R mid shin medial: 1.1cm erythematous and tan macule with peripheral scale  Objective  Left Cheek: Dilated blood vessel  Objective  Nose: Erythematous thin papules/macules with gritty scale.   Objective  Right medial zygoma: Tan macule 0.4cm       Objective  Right elbow: 0.2cm dark brown thin papule        Assessment & Plan  Inflamed seborrheic keratosis (2) Left medial ankle x 1, mid neck x 1  If not improving at left ankle will consider biopsy.   Prior to procedure, discussed risks of blister formation, small wound, skin dyspigmentation, or rare scar following cryotherapy.     Destruction of lesion - Left medial ankle x  1, mid neck x 1 Complexity: simple   Destruction method: cryotherapy   Informed consent: discussed and consent obtained   Lesion destroyed using liquid nitrogen: Yes   Cryotherapy cycles:  2 Outcome: patient tolerated procedure well with no complications   Post-procedure details: wound care instructions given    Nevus Left Lower Leg  Benign-appearing.  Observation.  Call clinic for new or changing lesions.  Recommend daily use of broad spectrum spf 30+ sunscreen to sun-exposed areas.    Porokeratosis R mid shin medial  Benign-appearing.  Sun-induced.  Will recheck on follow up.  Call for any changes  Telangiectasia Left Cheek  Benign-appearing.  Observation.  Call clinic for new or changing lesions.  Recommend daily use of broad spectrum spf 30+ sunscreen to sun-exposed areas.    AK (actinic keratosis) Nose  Prior to procedure, discussed risks of blister formation, small wound, skin dyspigmentation, or rare scar following cryotherapy.    Destruction of lesion - Nose  Destruction method: cryotherapy   Informed consent: discussed and consent obtained   Lesion destroyed using liquid nitrogen: Yes   Cryotherapy cycles:  2 Outcome: patient tolerated procedure well with no complications   Post-procedure details: wound care instructions given    Neoplasm of uncertain behavior of skin (2) Right medial zygoma  Right elbow  Favor macular SK at right medial zygoma  Favor benign nevus at right  elbow  Benign-appearing.  Observation.  Call clinic for new or changing lesions.  Recommend daily use of broad spectrum spf 30+ sunscreen to sun-exposed areas.   Hemangiomas - Red papules - Discussed benign nature - Observe - Call for any changes  Melanocytic Nevi - Tan-brown and/or pink-flesh-colored symmetric macules and papules - Benign appearing on exam today - Observation - Call clinic for new or changing moles - Recommend daily use of broad spectrum spf 30+ sunscreen to  sun-exposed areas.   Seborrheic Keratoses - Stuck-on, waxy, tan-brown papules and plaques  - Discussed benign etiology and prognosis. - Observe - Call for any changes  History of Skin Cancer at nasal dorsum -Clear. Observe for recurrence.  -Call clinic for new or changing lesions.   -Recommend regular skin exams, daily broad-spectrum spf 30+ sunscreen use, and photoprotection.     Return for TBSE and recheck AK's, ISK's, porokeratosis.  Graciella Belton, RMA, am acting as scribe for Forest Gleason, MD .  Documentation: I have reviewed the above documentation for accuracy and completeness, and I agree with the above.  Forest Gleason, MD

## 2020-09-10 ENCOUNTER — Other Ambulatory Visit: Payer: Self-pay

## 2020-09-16 ENCOUNTER — Encounter: Payer: Self-pay | Admitting: Dermatology

## 2020-09-24 ENCOUNTER — Ambulatory Visit (INDEPENDENT_AMBULATORY_CARE_PROVIDER_SITE_OTHER): Payer: Medicare Other | Admitting: Internal Medicine

## 2020-09-24 ENCOUNTER — Other Ambulatory Visit: Payer: Self-pay

## 2020-09-24 ENCOUNTER — Encounter: Payer: Self-pay | Admitting: Internal Medicine

## 2020-09-24 VITALS — BP 138/76 | HR 64 | Temp 97.8°F | Resp 14 | Ht 67.0 in | Wt 140.4 lb

## 2020-09-24 DIAGNOSIS — R7303 Prediabetes: Secondary | ICD-10-CM

## 2020-09-24 DIAGNOSIS — E7849 Other hyperlipidemia: Secondary | ICD-10-CM

## 2020-09-24 DIAGNOSIS — E559 Vitamin D deficiency, unspecified: Secondary | ICD-10-CM | POA: Diagnosis not present

## 2020-09-24 DIAGNOSIS — I10 Essential (primary) hypertension: Secondary | ICD-10-CM

## 2020-09-24 DIAGNOSIS — Z0000 Encounter for general adult medical examination without abnormal findings: Secondary | ICD-10-CM

## 2020-09-24 DIAGNOSIS — F5101 Primary insomnia: Secondary | ICD-10-CM

## 2020-09-24 DIAGNOSIS — E039 Hypothyroidism, unspecified: Secondary | ICD-10-CM

## 2020-09-24 DIAGNOSIS — I700 Atherosclerosis of aorta: Secondary | ICD-10-CM

## 2020-09-24 LAB — COMPREHENSIVE METABOLIC PANEL WITH GFR
ALT: 19 U/L (ref 0–35)
AST: 22 U/L (ref 0–37)
Albumin: 4.1 g/dL (ref 3.5–5.2)
Alkaline Phosphatase: 43 U/L (ref 39–117)
BUN: 15 mg/dL (ref 6–23)
CO2: 32 meq/L (ref 19–32)
Calcium: 9.2 mg/dL (ref 8.4–10.5)
Chloride: 100 meq/L (ref 96–112)
Creatinine, Ser: 0.8 mg/dL (ref 0.40–1.20)
GFR: 70.12 mL/min
Glucose, Bld: 84 mg/dL (ref 70–99)
Potassium: 4.5 meq/L (ref 3.5–5.1)
Sodium: 138 meq/L (ref 135–145)
Total Bilirubin: 0.4 mg/dL (ref 0.2–1.2)
Total Protein: 7 g/dL (ref 6.0–8.3)

## 2020-09-24 LAB — HEMOGLOBIN A1C: Hgb A1c MFr Bld: 6.2 % (ref 4.6–6.5)

## 2020-09-24 LAB — LIPID PANEL
Cholesterol: 152 mg/dL (ref 0–200)
HDL: 48.9 mg/dL
LDL Cholesterol: 79 mg/dL (ref 0–99)
NonHDL: 103.1
Total CHOL/HDL Ratio: 3
Triglycerides: 120 mg/dL (ref 0.0–149.0)
VLDL: 24 mg/dL (ref 0.0–40.0)

## 2020-09-24 LAB — TSH: TSH: 1.52 u[IU]/mL (ref 0.35–4.50)

## 2020-09-24 LAB — VITAMIN D 25 HYDROXY (VIT D DEFICIENCY, FRACTURES): VITD: 57.39 ng/mL (ref 30.00–100.00)

## 2020-09-24 MED ORDER — TRAZODONE HCL 50 MG PO TABS: 25.0000 mg | ORAL_TABLET | Freq: Every evening | ORAL | 3 refills | Status: DC | PRN

## 2020-09-24 NOTE — Patient Instructions (Addendum)
Take your melatonin at dinner time instead of late night. It is more effective this way    I have added   50 mg trazodone to take at bedtime. You can start with 1/2 dose,  And after one week  Suspend trazodone to see if melatonin is now working  Better    You need 1200 mg calcium and 800 Ius of D3 daily  I recommend getting the majority of your calcium and Vitamin D  through diet rather than supplements given the recent association of calcium supplements with increased coronary artery calcium scores  Soy milk and almond milk are great non dairy alternatives to cow's milk   Health Maintenance After Age 44 After age 17, you are at a higher risk for certain long-term diseases and infections as well as injuries from falls. Falls are a major cause of broken bones and head injuries in people who are older than age 2. Getting regular preventive care can help to keep you healthy and well. Preventive care includes getting regular testing and making lifestyle changes as recommended by your health care provider. Talk with your health care provider about:  Which screenings and tests you should have. A screening is a test that checks for a disease when you have no symptoms.  A diet and exercise plan that is right for you. What should I know about screenings and tests to prevent falls? Screening and testing are the best ways to find a health problem early. Early diagnosis and treatment give you the best chance of managing medical conditions that are common after age 58. Certain conditions and lifestyle choices may make you more likely to have a fall. Your health care provider may recommend:  Regular vision checks. Poor vision and conditions such as cataracts can make you more likely to have a fall. If you wear glasses, make sure to get your prescription updated if your vision changes.  Medicine review. Work with your health care provider to regularly review all of the medicines you are taking, including  over-the-counter medicines. Ask your health care provider about any side effects that may make you more likely to have a fall. Tell your health care provider if any medicines that you take make you feel dizzy or sleepy.  Osteoporosis screening. Osteoporosis is a condition that causes the bones to get weaker. This can make the bones weak and cause them to break more easily.  Blood pressure screening. Blood pressure changes and medicines to control blood pressure can make you feel dizzy.  Strength and balance checks. Your health care provider may recommend certain tests to check your strength and balance while standing, walking, or changing positions.  Foot health exam. Foot pain and numbness, as well as not wearing proper footwear, can make you more likely to have a fall.  Depression screening. You may be more likely to have a fall if you have a fear of falling, feel emotionally low, or feel unable to do activities that you used to do.  Alcohol use screening. Using too much alcohol can affect your balance and may make you more likely to have a fall. What actions can I take to lower my risk of falls? General instructions  Talk with your health care provider about your risks for falling. Tell your health care provider if: ? You fall. Be sure to tell your health care provider about all falls, even ones that seem minor. ? You feel dizzy, sleepy, or off-balance.  Take over-the-counter and prescription medicines only  as told by your health care provider. These include any supplements.  Eat a healthy diet and maintain a healthy weight. A healthy diet includes low-fat dairy products, low-fat (lean) meats, and fiber from whole grains, beans, and lots of fruits and vegetables. Home safety  Remove any tripping hazards, such as rugs, cords, and clutter.  Install safety equipment such as grab bars in bathrooms and safety rails on stairs.  Keep rooms and walkways well-lit. Activity   Follow a  regular exercise program to stay fit. This will help you maintain your balance. Ask your health care provider what types of exercise are appropriate for you.  If you need a cane or walker, use it as recommended by your health care provider.  Wear supportive shoes that have nonskid soles. Lifestyle  Do not drink alcohol if your health care provider tells you not to drink.  If you drink alcohol, limit how much you have: ? 0-1 drink a day for women. ? 0-2 drinks a day for men.  Be aware of how much alcohol is in your drink. In the [place] one drink equals one typical bottle of beer (12 oz), one-half glass of wine (5 oz), or one shot of hard liquor (1 oz).  Do not use any products that contain nicotine or tobacco, such as cigarettes and e-cigarettes. If you need help quitting, ask your health care provider. Summary  Having a healthy lifestyle and getting preventive care can help to protect your health and wellness after age 78.  Screening and testing are the best way to find a health problem early and help you avoid having a fall. Early diagnosis and treatment give you the best chance for managing medical conditions that are more common for people who are older than age 69.  Falls are a major cause of broken bones and head injuries in people who are older than age 75. Take precautions to prevent a fall at home.  Work with your health care provider to learn what changes you can make to improve your health and wellness and to prevent falls. This information is not intended to replace advice given to you by your health care provider. Make sure you discuss any questions you have with your health care provider. Document Revised: 03/01/2019 Document Reviewed: 09/21/2017 Elsevier Patient Education  2020 [employer].

## 2020-09-24 NOTE — Progress Notes (Signed)
Patient ID: [name], female    DOB: [birth year]  Age: 79 y.o. MRN: [id number]  The patient is here for annual preventive examination and management of other chronic and acute problems.  This visit occurred during the SARS-CoV-2 public health emergency.  Safety protocols were in place, including screening questions prior to the visit, additional usage of staff PPE, and extensive cleaning of exam room while observing appropriate contact time as indicated for disinfecting solutions.    Patient has received both doses of the available COVID 19 vaccine without complications.  Patient continues to mask when outside of the home except when walking in yard or at safe distances from others .  Patient denies any change in mood or development of unhealthy behaviors resuting from the pandemic's restriction of activities and socialization.    The risk factors are reflected in the social history.  The roster of all physicians providing medical care to patient - is listed in the Snapshot section of the chart.  Activities of daily living:  The patient is 100% independent in all ADLs: dressing, toileting, feeding as well as independent mobility  Home safety : The patient has smoke detectors in the home. They wear seatbelts.  There are no firearms at home. There is no violence in the home.   There is no risks for hepatitis, STDs or HIV. There is no   history of blood transfusion. They have no travel history to infectious disease endemic areas of the world.  The patient has seen their dentist in the last six month. They have seen their eye doctor in the last year. Se denies  hearing difficulty with regard to whispered voices and some television programs.  They have deferred audiologic testing in the last year.  They do not  have excessive sun exposure. Discussed the need for sun protection: hats, long sleeves and use of sunscreen if there is significant sun exposure.   Diet: the importance of a healthy diet  is discussed. They do have a healthy diet.  The benefits of regular aerobic exercise were discussed. She walks 4 times per week ,  20 minutes.   Depression screen: there are no signs or vegative symptoms of depression- irritability, change in appetite, anhedonia, sadness/tearfullness.  Cognitive assessment: the patient manages all their financial and personal affairs and is actively engaged. They could relate day,date,year and events; recalled 2/3 objects at 3 minutes; performed clock-face test normally.  The following portions of the patient's history were reviewed and updated as appropriate: allergies, current medications, past family history, past medical history,  past surgical history, past social history  and problem list.  Visual acuity was not assessed per patient preference since she has regular follow up with her ophthalmologist. Hearing and body mass index were assessed and reviewed.   During the course of the visit the patient was educated and counseled about appropriate screening and preventive services including : fall prevention , diabetes screening, nutrition counseling, colorectal cancer screening, and recommended immunizations.    CC: The primary encounter diagnosis was Essential hypertension. Diagnoses of Abdominal aortic atherosclerosis ([place]), Prediabetes, Acquired hypothyroidism, Familial hyperlipidemia, Vitamin D deficiency, and Primary insomnia were also pertinent to this visit.  Still having trouble sleeping in spite of her  husband sleeping better.  Taking melatonin at bedtime currently.  No other issues. Hypertension: patient checks blood pressure twice weekly at home.  Readings have been for the most part < 140/80 at rest . Patient is following a reduce salt diet most days  and is taking medications as prescribed Reviewed findings of prior CT scan today..  Patient is tolerating  statin therapy .    History [name] has a past medical history of Arthritis,  Hyperlipidemia, Hypothyroidism, and PVC's (premature ventricular contractions).   She has a past surgical history that includes Abdominal hysterectomy; Vaginal prolapse repair (2014); Colonoscopy with propofol (N/A, 12/29/2015); Brow lift (Bilateral, 07/12/2017); Ptosis repair (Bilateral, 07/12/2017); Colonoscopy with propofol (N/A, 12/03/2019); and polypectomy (N/A, 12/03/2019).   Her family history includes Breast cancer in her mother and sister; Cancer in her brother, brother, brother, sister, and sister; Diabetes in her brother, brother, brother, father, and mother; Heart attack in her brother and mother; Pancreatic cancer in her sister; Stroke in her father.She reports that she has quit smoking. Her smoking use included cigarettes. She quit after 1.00 year of use. She has never used smokeless tobacco. She reports that she does not drink alcohol and does not use drugs.  Outpatient Medications Prior to Visit  Medication Sig Dispense Refill  . aspirin EC 81 MG tablet Take 81 mg by mouth daily.      . cholecalciferol (VITAMIN D) 1000 UNITS tablet Take 2,000 Units by mouth daily.     . Cranberry 425 MG CAPS Take by mouth daily.    Marland Kitchen diltiazem (CARDIZEM CD) 120 MG 24 hr capsule Take 1 capsule (120 mg total) by mouth daily. 90 capsule 1  . diphenhydramine-acetaminophen (TYLENOL PM) 25-500 MG TABS tablet Take 1 tablet by mouth at bedtime as needed.    Marland Kitchen glucosamine-chondroitin ([employer] GLUCOSAMINE-CHONDROITIN) 500-400 MG tablet Take 1 tablet by mouth daily.     Marland Kitchen levothyroxine (SYNTHROID) 50 MCG tablet TAKE 1 TABLET BY MOUTH  DAILY 90 tablet 3  . Multiple Vitamins-Minerals (MULTIVITAMIN WITH MINERALS) tablet Take 1 tablet by mouth daily.      . Omega-3 Fatty Acids (FISH OIL) 1000 MG CPDR Take by mouth daily.    . rosuvastatin (CRESTOR) 10 MG tablet TAKE 1 TABLET BY MOUTH  DAILY 90 tablet 3  . telmisartan (MICARDIS) 20 MG tablet TAKE 1 TABLET (20 MG TOTAL) BY MOUTH DAILY. IN THE MORNING FOR BLOOD PRESSURE 90  tablet 1   No facility-administered medications prior to visit.    Review of Systems   Patient denies headache, fevers, malaise, unintentional weight loss, skin rash, eye pain, sinus congestion and sinus pain, sore throat, dysphagia,  hemoptysis , cough, dyspnea, wheezing, chest pain, palpitations, orthopnea, edema, abdominal pain, nausea, melena, diarrhea, constipation, flank pain, dysuria, hematuria, urinary  Frequency, nocturia, numbness, tingling, seizures,  Focal weakness, Loss of consciousness,  Tremor, insomnia, depression, anxiety, and suicidal ideation.     Objective:  BP 138/76 (BP Location: Left Arm, Patient Position: Sitting, Cuff Size: Normal)   Pulse 64   Temp 97.8 F (36.6 C) (Oral)   Resp 14   Ht 5\' 7"  (1.702 m)   Wt 140 lb 6.4 oz (63.7 kg)   SpO2 99%   BMI 21.99 kg/m   Physical Exam  General appearance: alert, cooperative and appears stated age Head: Normocephalic, without obvious abnormality, atraumatic Eyes: conjunctivae/corneas clear. PERRL, EOM's intact. Fundi benign. Ears: normal TM's and external ear canals both ears Nose: Nares normal. Septum midline. Mucosa normal. No drainage or sinus tenderness. Throat: lips, mucosa, and tongue normal; teeth and gums normal Neck: no adenopathy, no carotid bruit, no JVD, supple, symmetrical, trachea midline and thyroid not enlarged, symmetric, no tenderness/mass/nodules Lungs: clear to auscultation bilaterally Breasts: normal appearance, no masses or tenderness Heart:  regular rate and rhythm, S1, S2 normal, no murmur, click, rub or gallop Abdomen: soft, non-tender; bowel sounds normal; no masses,  no organomegaly Extremities: extremities normal, atraumatic, no cyanosis or edema Pulses: 2+ and symmetric Skin: Skin color, texture, turgor normal. No rashes or lesions Neurologic: Alert and oriented X 3, normal strength and tone. Normal symmetric reflexes. Normal coordination and gait.     Assessment & Plan:   Problem  List Items Addressed This Visit      Unprioritized   Abdominal aortic atherosclerosis ([place])    Reviewed findings of prior CT scan today..  Patient is tolerating  statin therapy and LFTs are normal.   .    Lab Results  Component Value Date   CHOL 152 09/24/2020   HDL 48.90 09/24/2020   LDLCALC 79 09/24/2020   LDLDIRECT 85.0 03/06/2018   TRIG 120.0 09/24/2020   CHOLHDL 3 09/24/2020   Lab Results  Component Value Date   ALT 19 09/24/2020   AST 22 09/24/2020   ALKPHOS 43 09/24/2020   BILITOT 0.4 09/24/2020          Acquired hypothyroidism    Thyroid function is WNL on current dose pf 50 mcg daily of levothyroxine  .  No current changes needed.   Lab Results  Component Value Date   TSH 1.52 09/24/2020         Relevant Orders   TSH (Completed)   Essential hypertension - Primary    Well controlled on telmisartan 20 mg daily . Renal function stable, no changes today.  Lab Results  Component Value Date   CREATININE 0.80 09/24/2020   Lab Results  Component Value Date   NA 138 09/24/2020   K 4.5 09/24/2020   CL 100 09/24/2020   CO2 32 09/24/2020         Relevant Orders   Comprehensive metabolic panel (Completed)   Familial hyperlipidemia    Managed with crestor,  LDL is now 20 .  lfts normal.  No changes today   Lab Results  Component Value Date   CHOL 152 09/24/2020   HDL 48.90 09/24/2020   LDLCALC 79 09/24/2020   LDLDIRECT 85.0 03/06/2018   TRIG 120.0 09/24/2020   CHOLHDL 3 09/24/2020   Lab Results  Component Value Date   ALT 19 09/24/2020   AST 22 09/24/2020   ALKPHOS 43 09/24/2020   BILITOT 0.4 09/24/2020          Relevant Orders   Lipid panel (Completed)   Insomnia disorder    Recommend taking melatonin after dinner and add qhs trazodone if still having trouble initiation sleep      Prediabetes    Her  a1c has risen slightly in spite of adherence to low glycemic index diet.  Encouraged to increase her exercise   Lab Results  Component  Value Date   HGBA1C 6.2 09/24/2020         Relevant Orders   Hemoglobin A1c (Completed)   Vitamin D deficiency   Relevant Orders   VITAMIN D 25 Hydroxy (Vit-D Deficiency, Fractures) (Completed)      I am having Karen Lennert. Sullivan start on traZODone. I am also having her maintain her aspirin EC, multivitamin with minerals, glucosamine-chondroitin, cholecalciferol, Fish Oil, diphenhydramine-acetaminophen, Cranberry, diltiazem, telmisartan, levothyroxine, and rosuvastatin.  Meds ordered this encounter  Medications  . traZODone (DESYREL) 50 MG tablet    Sig: Take 0.5-1 tablets (25-50 mg total) by mouth at bedtime as needed for sleep.    Dispense:  30 tablet    Refill:  3    There are no discontinued medications.  Follow-up: Return in about 6 months (around 03/24/2021).   [name], MD

## 2020-09-24 NOTE — Assessment & Plan Note (Addendum)
Reviewed findings of prior CT scan today..  Patient is tolerating  statin therapy and LFTs are normal.   .    Lab Results  Component Value Date   CHOL 152 09/24/2020   HDL 48.90 09/24/2020   LDLCALC 79 09/24/2020   LDLDIRECT 85.0 03/06/2018   TRIG 120.0 09/24/2020   CHOLHDL 3 09/24/2020   Lab Results  Component Value Date   ALT 19 09/24/2020   AST 22 09/24/2020   ALKPHOS 43 09/24/2020   BILITOT 0.4 09/24/2020

## 2020-09-25 DIAGNOSIS — G4700 Insomnia, unspecified: Secondary | ICD-10-CM | POA: Insufficient documentation

## 2020-09-25 NOTE — Assessment & Plan Note (Signed)
Thyroid function is WNL on current dose pf 50 mcg daily of levothyroxine  .  No current changes needed.   Lab Results  Component Value Date   TSH 1.52 09/24/2020

## 2020-09-25 NOTE — Progress Notes (Signed)
All of your labs are normal,. Including thyroid function  and  cholesterol .  Continue your current medications.    Deborra Medina, MD

## 2020-09-25 NOTE — Assessment & Plan Note (Signed)
Managed with crestor,  LDL is now 79 .  lfts normal.  No changes today   Lab Results  Component Value Date   CHOL 152 09/24/2020   HDL 48.90 09/24/2020   LDLCALC 79 09/24/2020   LDLDIRECT 85.0 03/06/2018   TRIG 120.0 09/24/2020   CHOLHDL 3 09/24/2020   Lab Results  Component Value Date   ALT 19 09/24/2020   AST 22 09/24/2020   ALKPHOS 43 09/24/2020   BILITOT 0.4 09/24/2020

## 2020-09-25 NOTE — Assessment & Plan Note (Addendum)
Her  a1c has risen slightly in spite of adherence to low glycemic index diet.  Encouraged to increase her exercise   Lab Results  Component Value Date   HGBA1C 6.2 09/24/2020

## 2020-09-25 NOTE — Assessment & Plan Note (Signed)
Well controlled on telmisartan 20 mg daily . Renal function stable, no changes today.  Lab Results  Component Value Date   CREATININE 0.80 09/24/2020   Lab Results  Component Value Date   NA 138 09/24/2020   K 4.5 09/24/2020   CL 100 09/24/2020   CO2 32 09/24/2020

## 2020-09-25 NOTE — Assessment & Plan Note (Signed)
Recommend taking melatonin after dinner and add qhs trazodone if still having trouble initiation sleep

## 2020-10-15 ENCOUNTER — Other Ambulatory Visit: Payer: Self-pay | Admitting: Cardiovascular Disease

## 2020-10-17 ENCOUNTER — Other Ambulatory Visit: Payer: Self-pay | Admitting: Internal Medicine

## 2020-10-23 ENCOUNTER — Ambulatory Visit: Payer: Medicare Other | Admitting: Dermatology

## 2020-10-23 ENCOUNTER — Other Ambulatory Visit: Payer: Self-pay

## 2020-10-23 DIAGNOSIS — D225 Melanocytic nevi of trunk: Secondary | ICD-10-CM | POA: Diagnosis not present

## 2020-10-23 DIAGNOSIS — D2272 Melanocytic nevi of left lower limb, including hip: Secondary | ICD-10-CM

## 2020-10-23 DIAGNOSIS — C44712 Basal cell carcinoma of skin of right lower limb, including hip: Secondary | ICD-10-CM | POA: Diagnosis not present

## 2020-10-23 DIAGNOSIS — D2261 Melanocytic nevi of right upper limb, including shoulder: Secondary | ICD-10-CM | POA: Diagnosis not present

## 2020-10-23 DIAGNOSIS — D1800 Hemangioma unspecified site: Secondary | ICD-10-CM

## 2020-10-23 DIAGNOSIS — D485 Neoplasm of uncertain behavior of skin: Secondary | ICD-10-CM

## 2020-10-23 DIAGNOSIS — L821 Other seborrheic keratosis: Secondary | ICD-10-CM

## 2020-10-23 DIAGNOSIS — Z85828 Personal history of other malignant neoplasm of skin: Secondary | ICD-10-CM

## 2020-10-23 DIAGNOSIS — D229 Melanocytic nevi, unspecified: Secondary | ICD-10-CM

## 2020-10-23 DIAGNOSIS — Z1283 Encounter for screening for malignant neoplasm of skin: Secondary | ICD-10-CM

## 2020-10-23 DIAGNOSIS — L578 Other skin changes due to chronic exposure to nonionizing radiation: Secondary | ICD-10-CM

## 2020-10-23 DIAGNOSIS — C4491 Basal cell carcinoma of skin, unspecified: Secondary | ICD-10-CM

## 2020-10-23 DIAGNOSIS — L814 Other melanin hyperpigmentation: Secondary | ICD-10-CM

## 2020-10-23 HISTORY — DX: Basal cell carcinoma of skin, unspecified: C44.91

## 2020-10-23 MED ORDER — MUPIROCIN 2 % EX OINT: 1.0000 "application " | TOPICAL_OINTMENT | Freq: Two times a day (BID) | CUTANEOUS | 0 refills | Status: DC

## 2020-10-23 NOTE — Patient Instructions (Signed)

## 2020-10-23 NOTE — Progress Notes (Signed)
Follow-Up Visit   Subjective  [name] is a 79 y.o. female who presents for the following: Annual Exam (TBSE today. Hx of a skin cancer 20+ years ago at the nasal dorsum. ) and Follow-up (6 wk f/u for ISKs at the left medial ankle x 1 and mid neck x 1; AK at the nose x 1; Recheck porokeratosis at the right mid shin medial ).  The following portions of the chart were reviewed this encounter and updated as appropriate:  Tobacco  Allergies  Meds  Problems  Med Hx  Surg Hx  Fam Hx       Review of Systems: No other skin or systemic complaints except as noted in HPI or Assessment and Plan.   Objective  Well appearing patient in no apparent distress; mood and affect are within normal limits.  A full examination was performed including scalp, head, eyes, ears, nose, lips, neck, chest, axillae, abdomen, back, buttocks, bilateral upper extremities, bilateral lower extremities, hands, feet, fingers, toes, fingernails, and toenails. All findings within normal limits unless otherwise noted below.  Objective  Left medial knee: 0.3 cm medium brown papule with dark brown center R/o atypia       Objective  Right lateral ankle: 1.2 cm scaly pink plaque R/o SCCIS       Objective  Right Upper Arm, Right Upper Back: 0.25 cm dark brown thin papule at right upper back 0.25 cm dark brown thin papule at right upper arm   Images          Assessment & Plan  Neoplasm of uncertain behavior of skin (2) Left medial knee  Epidermal / dermal shaving  Lesion diameter (cm):  0.3 Informed consent: discussed and consent obtained   Patient was prepped and draped in usual sterile fashion: Area prepped with alcohol. Anesthesia: the lesion was anesthetized in a standard fashion   Anesthetic:  1% lidocaine w/ epinephrine 1-100,000 buffered w/ 8.4% NaHCO3 Instrument used: flexible razor blade   Hemostasis achieved with: pressure, aluminum chloride and electrodesiccation    Outcome: patient tolerated procedure well   Post-procedure details: wound care instructions given   Post-procedure details comment:  Ointment and small bandage applied  Specimen 1 - Surgical pathology Differential Diagnosis: R/o atypia Check Margins: No 0.3 cm medium brown papule with dark brown center  Right lateral ankle  Skin / nail biopsy Type of biopsy: tangential   Informed consent: discussed and consent obtained   Timeout: patient name, date of birth, surgical site, and procedure verified   Procedure prep:  Patient was prepped and draped in usual sterile fashion Prep type:  Isopropyl alcohol Anesthesia: the lesion was anesthetized in a standard fashion   Anesthetic:  1% lidocaine w/ epinephrine 1-100,000 buffered w/ 8.4% NaHCO3 Instrument used: flexible razor blade   Hemostasis achieved with: pressure, aluminum chloride and electrodesiccation   Outcome: patient tolerated procedure well   Post-procedure details: sterile dressing applied and wound care instructions given   Dressing type: bandage and petrolatum    Specimen 2 - Surgical pathology Differential Diagnosis: R/o SCCIS Check Margins: No 1.2 cm scaly pink plaque   Ordered Medications: mupirocin ointment (BACTROBAN) 2 %  Nevus (2) Right Upper Arm; Right Upper Back  Benign-appearing.  Recheck on follow-up. Call clinic for new or changing lesions.  Recommend daily use of broad spectrum spf 30+ sunscreen to sun-exposed areas.    Lentigines - Scattered tan macules - Discussed due to sun exposure - Benign, observe - Call for any  changes  Seborrheic Keratoses - Stuck-on, waxy, tan-brown papules and plaques  - Discussed benign etiology and prognosis. - Observe - Call for any changes  Melanocytic Nevi - Tan-brown and/or pink-flesh-colored symmetric macules and papules - Benign appearing on exam today - Observation - Call clinic for new or changing moles - Recommend daily use of broad spectrum spf 30+  sunscreen to sun-exposed areas.   Hemangiomas - Red papules - Discussed benign nature - Observe - Call for any changes  Actinic Damage - Chronic, secondary to cumulative UV/sun exposure - diffuse scaly erythematous macules with underlying dyspigmentation - Recommend daily broad spectrum sunscreen SPF 30+ to sun-exposed areas, reapply every 2 hours as needed.  - Call for new or changing lesions.  Skin cancer screening performed today.  History of Skin Cancer at nasal dorsum -Clear. Observe for recurrence.  -Call clinic for new or changing lesions.   -Recommend regular skin exams, daily broad-spectrum spf 30+ sunscreen use, and photoprotection.      Return in about 3 months (around 01/21/2021) for Recheck nevi.   I, Harriett Sine, CMA, am acting as scribe for Forest Gleason, MD.  Documentation: I have reviewed the above documentation for accuracy and completeness, and I agree with the above.  Forest Gleason, MD

## 2020-10-29 NOTE — Progress Notes (Signed)
1. Skin , left medial knee MELANOCYTIC NEVUS, INTRADERMAL TYPE  This is a NORMAL MOLE. No additional treatment is needed.   2. Skin , right lateral ankle BASAL CELL CARCINOMA, SUPERFICIAL AND NODULAR PATTERNS --> ED&C  Dr. Jerilynn Mages called 10/29/2020 and discussed   MAs please schedule for [facility] (can move up recheck nevi appointment to do at the same time in Jan or Feb); 6 month FBSE

## 2020-10-30 ENCOUNTER — Telehealth: Payer: Self-pay

## 2020-10-30 NOTE — Telephone Encounter (Signed)
Called patient and scheduled her for [facility] 12/18/20 at 9:45am, JS

## 2020-10-30 NOTE — Telephone Encounter (Signed)
-----   Message from Alfonso Patten, MD sent at 10/29/2020  3:37 PM EST ----- 1. Skin , left medial knee MELANOCYTIC NEVUS, INTRADERMAL TYPE  This is a NORMAL MOLE. No additional treatment is needed.   2. Skin , right lateral ankle BASAL CELL CARCINOMA, SUPERFICIAL AND NODULAR PATTERNS --> ED&C  Dr. Jerilynn Mages called 10/29/2020 and discussed   MAs please schedule for [facility] (can move up recheck nevi appointment to do at the same time in Jan or Feb); 6 month FBSE

## 2020-11-19 ENCOUNTER — Encounter: Payer: Self-pay | Admitting: Dermatology

## 2020-11-20 ENCOUNTER — Other Ambulatory Visit: Payer: Self-pay | Admitting: Internal Medicine

## 2020-12-18 ENCOUNTER — Encounter: Payer: Self-pay | Admitting: Dermatology

## 2020-12-18 ENCOUNTER — Ambulatory Visit: Payer: Medicare Other | Admitting: Dermatology

## 2020-12-18 ENCOUNTER — Other Ambulatory Visit: Payer: Self-pay

## 2020-12-18 DIAGNOSIS — C44712 Basal cell carcinoma of skin of right lower limb, including hip: Secondary | ICD-10-CM

## 2020-12-18 DIAGNOSIS — D2261 Melanocytic nevi of right upper limb, including shoulder: Secondary | ICD-10-CM | POA: Diagnosis not present

## 2020-12-18 DIAGNOSIS — D229 Melanocytic nevi, unspecified: Secondary | ICD-10-CM

## 2020-12-18 DIAGNOSIS — D225 Melanocytic nevi of trunk: Secondary | ICD-10-CM

## 2020-12-18 NOTE — Progress Notes (Signed)
   Follow-Up Visit   Subjective  [name] is a 80 y.o. female who presents for the following: Procedure (Patient here today for [facility] to bx proven superficial BCC at right lateral ankle.).  Patient also here to recheck nevi at right upper back and right upper arm.   The following portions of the chart were reviewed this encounter and updated as appropriate:       Review of Systems:  No other skin or systemic complaints except as noted in HPI or Assessment and Plan.  Objective  Well appearing patient in no apparent distress; mood and affect are within normal limits.  A focused examination was performed including back, right arm, right leg. Relevant physical exam findings are noted in the Assessment and Plan.  Objective  Right Upper Arm, right upper back: Right upper arm 0.25cm dark brown thin papule (photo of right upper arm today) Right upper back 0.25cm dark brown thin papule  Images    Objective  Right lateral ankle: Healing biopsy site   Assessment & Plan  Nevus Right Upper Arm, right upper back  Stable  Benign-appearing.  Observation.  Call clinic for new or changing lesions.  Recommend daily use of broad spectrum spf 30+ sunscreen to sun-exposed areas.    Basal cell carcinoma (BCC) of skin of right lower extremity including hip Right lateral ankle  Destruction of lesion  Destruction method: electrodesiccation and curettage   Informed consent: discussed and consent obtained   Timeout:  patient name, date of birth, surgical site, and procedure verified Patient was prepped and draped in usual sterile fashion: area prepped with isopropyl alcohol. Anesthesia: the lesion was anesthetized in a standard fashion   Anesthetic:  1% lidocaine w/ epinephrine 1-100,000 buffered w/ 8.4% NaHCO3 Curettage performed in three different directions: Yes   Electrodesiccation performed over the curetted area: Yes   Curettage cycles:  3 Final wound size (cm):   2.1 Hemostasis achieved with:  electrodesiccation Outcome: patient tolerated procedure well with no complications   Post-procedure details: wound care instructions given   Additional details:  Mupirocin and a pressure dressing applied  Return for as scheduled for TBSE.  Graciella Belton, RMA, am acting as scribe for Forest Gleason, MD .  Documentation: I have reviewed the above documentation for accuracy and completeness, and I agree with the above.  Forest Gleason, MD

## 2020-12-18 NOTE — Patient Instructions (Signed)

## 2020-12-21 ENCOUNTER — Encounter: Payer: Self-pay | Admitting: Dermatology

## 2020-12-22 ENCOUNTER — Encounter: Payer: Self-pay | Admitting: Family

## 2020-12-22 ENCOUNTER — Ambulatory Visit: Payer: Medicare Other | Admitting: Family

## 2020-12-22 ENCOUNTER — Other Ambulatory Visit: Payer: Self-pay

## 2020-12-22 VITALS — BP 130/66 | HR 69 | Ht 67.0 in | Wt 142.0 lb

## 2020-12-22 DIAGNOSIS — E782 Mixed hyperlipidemia: Secondary | ICD-10-CM

## 2020-12-22 DIAGNOSIS — I493 Ventricular premature depolarization: Secondary | ICD-10-CM | POA: Diagnosis not present

## 2020-12-22 DIAGNOSIS — I10 Essential (primary) hypertension: Secondary | ICD-10-CM | POA: Diagnosis not present

## 2020-12-22 DIAGNOSIS — K449 Diaphragmatic hernia without obstruction or gangrene: Secondary | ICD-10-CM | POA: Diagnosis not present

## 2020-12-22 DIAGNOSIS — R072 Precordial pain: Secondary | ICD-10-CM

## 2020-12-22 DIAGNOSIS — J9859 Other diseases of mediastinum, not elsewhere classified: Secondary | ICD-10-CM

## 2020-12-22 MED ORDER — PANTOPRAZOLE SODIUM 20 MG PO TBEC: 20.0000 mg | DELAYED_RELEASE_TABLET | Freq: Every day | ORAL | 2 refills | Status: DC

## 2020-12-22 MED ORDER — DILTIAZEM HCL ER COATED BEADS 120 MG PO CP24: 120.0000 mg | ORAL_CAPSULE | Freq: Every day | ORAL | 3 refills | Status: DC

## 2020-12-22 NOTE — Progress Notes (Signed)
Office Visit    Patient Name: [name] Date of Encounter: 12/22/2020  Primary Care Provider:  [name], MD Primary Cardiologist:  Ida Rogue, MD Electrophysiologist:  None   Chief Complaint    [name] is a 80 y.o. female with a hx of symptomatic PVC, hypothyroidism, abdominal atherosclerosis, hyperlipidemia presents today for follow up of PVCs  Past Medical History    Past Medical History:  Diagnosis Date  . Arthritis    knees  . Basal cell carcinoma 10/23/2020   right lateral ankle EDC done 12/18/20  . Hyperlipidemia   . Hypothyroidism   . PVC's (premature ventricular contractions)    a.  48-hour Holter in 3/17 demonstrated NSR, frequent PVCs totaling 5300 beats representing 3% burden, occasional PACs, short runs of SVT   Past Surgical History:  Procedure Laterality Date  . ABDOMINAL HYSTERECTOMY     In 30's  . BROW LIFT Bilateral 07/12/2017   Procedure: BLEPHAROPLASTY upper eyelid with excess skin;  Surgeon: Karle Starch, MD;  Location: No Name;  Service: Ophthalmology;  Laterality: Bilateral;  MAC  . COLONOSCOPY WITH PROPOFOL N/A 12/29/2015   Procedure: COLONOSCOPY WITH PROPOFOL;  Surgeon: Hulen Luster, MD;  Location: [facility] ENDOSCOPY;  Service: Gastroenterology;  Laterality: N/A;  . COLONOSCOPY WITH PROPOFOL N/A 12/03/2019   Procedure: COLONOSCOPY WITH BIOPSY;  Surgeon: [name], MD;  Location: [place];  Service: Endoscopy;  Laterality: N/A;  . POLYPECTOMY N/A 12/03/2019   Procedure: POLYPECTOMY;  Surgeon: [name], MD;  Location: [place];  Service: Endoscopy;  Laterality: N/A;  . PTOSIS REPAIR Bilateral 07/12/2017   Procedure: ptosis repair resect ex;  Surgeon: Karle Starch, MD;  Location: [place];  Service: Ophthalmology;  Laterality: Bilateral;  Requests early  . VAGINAL PROLAPSE REPAIR  2014   Dr. Enzo Bi    Allergies  Allergies  Allergen Reactions  . Prednisone     "crazy"     History of Present Illness    [name] is a 80 y.o. female with a hx of symptomatic PVC, hypothyroidism, aortic atherosclerosis, hyperlipidemia last seen 12/18/2019 by Dr. Rockey Situ.  Previously evaluated 10/2015 with chest pain underwent treadmill nuclear stress test showing no ischemia and low normal LVEF.  She was hypertensive on presentation with hypertensive response to exercise.  She was started on metoprolol tartrate 25 mg twice daily.  However, did not tolerate due to fatigue.  Holter monitor 01/2016 showed 5300 PVCs in 48 hours representing 3% burden, occasional PAC, short runs of SVT.  She was switched to extended release diltiazem 120 mg daily.  Seen in follow-up 03/2017 feeling well with no recurrent chest pain.  At one point her diltiazem was changed every other day or due to mild bradycardia at outside office though she felt worse and was self resumed to once daily dosing.  She was asymptomatic with mild bradycardic rate is recommend she continue diltiazem 120 mg daily when seen by her primary cardiologist.  CT chest 01/02/2020 showing stable mediastinal wedge-shaped, slightly lobular soft tissue attenuation structure (62 HU) measuring 2.2 x 1.6 cm which was not significantly changed compared to previous study 10/23/2018.  Per radiology reading, continued stability and appearance strongly favors benignity with most likely etiology lymphatic or thymic in origin.  She presents today for follow-up.  Reports palpitations are well controlled on her present dose of diltiazem.  She monitors her blood pressure at home and tells me it is routinely at goal of  less than 130/80.  She reports a pressure sensation under her breasts in her midsternal region at the base of her sternum.[name] She wonders whether her midsternal mass or hiatal hernia is causing it. Tells me she is doing more as her husband who weighs 250lbs is in a wheelchair and she is his primary caretaker.  The discomfort is ongoing for 2  months and seems to be getting worse. It is bothersome when she is pulling her husband's wheelchair and it has hurt both at rest and with activity. It happens sometimes after she eats but not all the time. It is tender on palpation. Tells me it improves when she sits and rests and puts pressure on it. Has not tried ibuprofen, acetaminophen, heat, or ice.   Denies shortness of breath, dyspnea on exertion, lightheadedness, dizziness.  Reports no edema, orthopnea, PND.  EKGs/Labs/Other Studies Reviewed:   The following studies were reviewed today:  CT chest WO contrast 01/02/20  FINDINGS: Cardiovascular: Normal heart size. Trace pericardial fluid is similar to comparison studies. Coronary artery calcifications are again seen. Normal thoracic aortic caliber. Calcification of the aortic arch and proximal great vessels which branch normally. Central pulmonary arteries are normal caliber.   Mediastinum/Nodes: Within the anterior mediastinum is a stable wedge-shaped, slightly lobular soft tissue attenuation structure (62 HU) measuring 2.2 x 1.6 cm in size which is not significantly changed from comparison is as remote as 10/23/2018. No suspicious calcifications, infiltrative features or significant interval change from comparison exam to suggest an aggressive mass lesion. No pathologically enlarged mediastinal or axillary adenopathy. No other soft tissue masses within the mediastinum. Thyroid gland and thoracic inlet are free of acute abnormality. No acute tracheal or esophageal abnormality. Small hiatal hernia.   Lungs/Pleura: Biapical pleuroparenchymal scarring is similar to prior exam. No new suspicious nodules or masses. No consolidation, features of edema, pneumothorax, or effusion.   Upper Abdomen: No acute abnormalities present in the visualized portions of the upper abdomen.   Musculoskeletal: Multilevel degenerative changes are present in the imaged portions of the spine. No acute  osseous abnormality or suspicious osseous lesion.   IMPRESSION: 1. Stable appearance of the anterior mediastinum with wedge-shaped, slightly lobular soft tissue attenuation structure measuring up to 2.2 cm in size. Continued stability and appearance strongly favors benignity. Most likely etiology is either lymphatic or thymic in origin. 2. No acute thoracic findings. 3. Coronary artery calcifications. 4. Aortic Atherosclerosis (ICD10-I70.0).  EKG:  EKG is ordered today.  The ekg ordered today demonstrates NSR 69 bpm with no acute ST/T wave changes.  Recent Labs: 03/20/2020: Hemoglobin 12.7; Platelets 242.0 09/24/2020: ALT 19; BUN 15; Creatinine, Ser 0.80; Potassium 4.5; Sodium 138; TSH 1.52  Recent Lipid Panel    Component Value Date/Time   CHOL 152 09/24/2020 1101   TRIG 120.0 09/24/2020 1101   HDL 48.90 09/24/2020 1101   CHOLHDL 3 09/24/2020 1101   VLDL 24.0 09/24/2020 1101   LDLCALC 79 09/24/2020 1101   LDLDIRECT 85.0 03/06/2018 1110    Home Medications   Current Meds  Medication Sig  . aspirin EC 81 MG tablet Take 81 mg by mouth daily.  . cholecalciferol (VITAMIN D) 1000 UNITS tablet Take 2,000 Units by mouth daily.   . diphenhydramine-acetaminophen (TYLENOL PM) 25-500 MG TABS tablet Take 1 tablet by mouth at bedtime as needed.  [name] glucosamine-chondroitin 500-400 MG tablet Take 1 tablet by mouth daily.   [name] levothyroxine (SYNTHROID) 50 MCG tablet TAKE 1 TABLET BY MOUTH  DAILY  . Multiple  Vitamins-Minerals (MULTIVITAMIN WITH MINERALS) tablet Take 1 tablet by mouth daily.  . mupirocin ointment (BACTROBAN) 2 % Apply 1 application topically 2 (two) times daily.  . Omega-3 Fatty Acids (FISH OIL) 1000 MG CPDR Take by mouth daily.  . pantoprazole (PROTONIX) 20 MG tablet Take 1 tablet (20 mg total) by mouth daily.  . rosuvastatin (CRESTOR) 10 MG tablet TAKE 1 TABLET BY MOUTH  DAILY  . telmisartan (MICARDIS) 20 MG tablet TAKE 1 TABLET (20 MG TOTAL) BY MOUTH DAILY. IN THE MORNING  FOR BLOOD PRESSURE  . traZODone (DESYREL) 50 MG tablet TAKE 0.5-1 TABLETS BY MOUTH AT BEDTIME AS NEEDED FOR SLEEP.  . [DISCONTINUED] diltiazem (CARDIZEM CD) 120 MG 24 hr capsule TAKE 1 CAPSULE BY MOUTH EVERY DAY     Review of Systems  All other systems reviewed and are otherwise negative except as noted above.  Physical Exam    VS:  BP 130/66 (BP Location: Left Arm, Patient Position: Sitting, Cuff Size: Normal)   Pulse 69   Ht _0  (1.702 m)   Wt 142 lb (64.4 kg)   BMI 22.24 kg/m  , BMI Body mass index is 22.24 kg/m.  Wt Readings from Last 3 Encounters:  12/22/20 142 lb (64.4 kg)  09/24/20 140 lb 6.4 oz (63.7 kg)  08/12/20 145 lb (65.8 kg)    GEN: Well nourished, well developed, in no acute distress. HEENT: normal. Neck: Supple, no JVD, carotid bruits, or masses. Cardiac:RRR, no murmurs, rubs, or gallops. No clubbing, cyanosis, edema.  Radials/DP/PT 2+ and equal bilaterally.  Respiratory:  Respirations regular and unlabored, clear to auscultation bilaterally. GI: Soft, nontender, nondistended.  Epigastric junction tender on palpation just below the sternum. MS: No deformity or atrophy. Skin: Warm and dry, no rash. Neuro:  Strength and sensation are intact. Psych: Normal affect.  Assessment & Plan    1. Mediastinal mass / Hiatal Hernia / Atypical chest discomfort - Reports 2 month history of chest discomfort below the sternum at rest and with activity, tender on palpation. Improved with rest and pressure. Known hx of mediastinal mass with CT 01/02/2020 stable wedge-shaped slightly lobular soft tissue attenuation structure 2.2 x 1.6 cm.  Likely etiology lymphatic or thymic in origin. Repeat CT Chest WO contrast for monitoring and due to chest discomfort. Low suspicion angina as occurs at rest and with activity and is located in epigastric region, EKG today NSR with no acute ST/T wave changes -no indication for ischemic evaluation at this time. Known small hiatal hernia which could  also be contributory-will trial Pantoprazole 33m QD. Check in via phone/MyChart in 2 weeks for reassessment of symptoms. 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 Recommend heat and Ibuprofen or Acetaminophen as needed.   2. Frequent PVC- Denies recurrent palpitations.  Continue diltiazem 120 mg daily.  Refill provided.  3. Hypertension-  BP well controlled. Continue current antihypertensive regimen including telmisartan 20 mg daily, diltiazem 120 mg daily.  4. Hypothyroidism- Continue to follow with primary care.  5. Hyperlipidemia- Lipid panel 09/23/2020 with LDL 79.  Continue rosuvastatin 10 mg daily.  Disposition: Follow up in 2 month(s) with Dr. GRockey Situor APP  Signed, CLoel Dubonnet NP 12/22/2020, 2:47 PM [place]

## 2020-12-22 NOTE — Patient Instructions (Addendum)
Medication Instructions:  Your physician has recommended you make the following change in your medication:   START Pantoprazole (Protonix) 20mg  daily  *If you need a refill on your cardiac medications before your next appointment, please call your pharmacy*  Lab Work: No lab work today  Testing/Procedures: Non-Cardiac CT scanning, (CAT scanning), is a noninvasive, special x-ray that produces cross-sectional images of the body using x-rays and a computer. CT scans help physicians diagnose and treat medical conditions. CT scans provide greater clarity and reveal more details than regular x-ray exams.  You may call [phone] at your convenience to schedule. Give a few days to get it precerted and then call.   Follow-Up: At [facility], you and your health needs are our priority.  As part of our continuing mission to provide you with exceptional heart care, we have created designated Provider Care Teams.  These Care Teams include your primary Cardiologist (physician) and Advanced Practice Providers (APPs -  Physician Assistants and Nurse Practitioners) who all work together to provide you with the care you need, when you need it.  We recommend signing up for the patient portal called "MyChart".  Sign up information is provided on this After Visit Summary.  MyChart is used to connect with patients for Virtual Visits (Telemedicine).  Patients are able to view lab/test results, encounter notes, upcoming appointments, etc.  Non-urgent messages can be sent to your provider as well.   To learn more about what you can do with MyChart, go to [email or web address].    Your next appointment:   1 year(s)  The format for your next appointment:   In Person  Provider:   You may see Ida Rogue, MD or one of the following Advanced Practice Providers on your designated Care Team:    Murray Hodgkins, NP  Christell Faith, PA-C  Marrianne Mood, PA-C  Cadence Kathlen Mody, [place]  Laurann Montana,  NP   Other Instructions   Hiatal Hernia  A hiatal hernia occurs when part of the stomach slides above the muscle that separates the abdomen from the chest (diaphragm). A person can be born with a hiatal hernia (congenital), or it may develop over time. In almost all cases of hiatal hernia, only the top part of the stomach pushes through the diaphragm. Many people have a hiatal hernia with no symptoms. The larger the hernia, the more likely it is that you will have symptoms. In some cases, a hiatal hernia allows stomach acid to flow back into the tube that carries food from your mouth to your stomach (esophagus). This may cause heartburn symptoms. Severe heartburn symptoms may mean that you have developed a condition called gastroesophageal reflux disease (GERD). What are the causes? This condition is caused by a weakness in the opening (hiatus) where the esophagus passes through the diaphragm to attach to the upper part of the stomach. A person may be born with a weakness in the hiatus, or a weakness can develop over time. What increases the risk? This condition is more likely to develop in:  Older people. Age is a major risk factor for a hiatal hernia, especially if you are over the age of 81.  Pregnant women.  People who are overweight.  People who have frequent constipation. What are the signs or symptoms? Symptoms of this condition usually develop in the form of GERD symptoms. Symptoms include:  Heartburn.  Belching.  Indigestion.  Trouble swallowing.  Coughing or wheezing.  Sore throat.  Hoarseness.  Chest pain.  Nausea and  vomiting. How is this diagnosed? This condition may be diagnosed during testing for GERD. Tests that may be done include:  X-rays of your stomach or chest.  An upper gastrointestinal (GI) series. This is an X-ray exam of your GI tract that is taken after you swallow a chalky liquid that shows up clearly on the X-ray.  Endoscopy. This is a  procedure to look into your stomach using a thin, flexible tube that has a tiny camera and light on the end of it. How is this treated? This condition may be treated by:  Dietary and lifestyle changes to help reduce GERD symptoms.  Medicines. These may include: ? Over-the-counter antacids. ? Medicines that make your stomach empty more quickly. ? Medicines that block the production of stomach acid (H2 blockers). ? Stronger medicines to reduce stomach acid (proton pump inhibitors).  Surgery to repair the hernia, if other treatments are not helping. If you have no symptoms, you may not need treatment. Follow these instructions at home: Lifestyle and activity  Do not use any products that contain nicotine or tobacco, such as cigarettes and e-cigarettes. If you need help quitting, ask your health care provider.  Try to achieve and maintain a healthy body weight.  Avoid putting pressure on your abdomen. Anything that puts pressure on your abdomen increases the amount of acid that may be pushed up into your esophagus. ? Avoid bending over, especially after eating. ? Raise the head of your bed by putting blocks under the legs. This keeps your head and esophagus higher than your stomach. ? Do not wear tight clothing around your chest or stomach. ? Try not to strain when having a bowel movement, when urinating, or when lifting heavy objects. Eating and drinking  Avoid foods that can worsen GERD symptoms. These may include: ? Fatty foods, like fried foods. ? Citrus fruits, like oranges or lemon. ? Other foods and drinks that contain acid, like orange juice or tomatoes. ? Spicy food. ? Chocolate.  Eat frequent small meals instead of three large meals a day. This helps prevent your stomach from getting too full. ? Eat slowly. ? Do not lie down right after eating. ? Do not eat 1-2 hours before bed.  Do not drink beverages with caffeine. These include cola, coffee, cocoa, and tea.  Do not  drink alcohol. General instructions  Take over-the-counter and prescription medicines only as told by your health care provider.  Keep all follow-up visits as told by your health care provider. This is important. Contact a health care provider if:  Your symptoms are not controlled with medicines or lifestyle changes.  You are having trouble swallowing.  You have coughing or wheezing that will not go away. Get help right away if:  Your pain is getting worse.  Your pain spreads to your arms, neck, jaw, teeth, or back.  You have shortness of breath.  You sweat for no reason.  You feel sick to your stomach (nauseous) or you vomit.  You vomit blood.  You have bright red blood in your stools.  You have black, tarry stools. This information is not intended to replace advice given to you by your health care provider. Make sure you discuss any questions you have with your health care provider. Document Revised: 10/21/2017 Document Reviewed: 06/13/2017 Elsevier Patient Education  [place].

## 2020-12-30 ENCOUNTER — Other Ambulatory Visit: Payer: Self-pay | Admitting: Dermatology

## 2020-12-30 DIAGNOSIS — D485 Neoplasm of uncertain behavior of skin: Secondary | ICD-10-CM

## 2021-01-05 ENCOUNTER — Encounter: Payer: Self-pay | Admitting: Family

## 2021-01-15 ENCOUNTER — Other Ambulatory Visit: Payer: Self-pay

## 2021-01-15 ENCOUNTER — Ambulatory Visit
Admission: RE | Admit: 2021-01-15 | Discharge: 2021-01-15 | Disposition: A | Discharge status: Home / Self Care | Payer: Medicare Other | Source: Ambulatory Visit | Attending: Family | Admitting: Family

## 2021-01-15 DIAGNOSIS — I700 Atherosclerosis of aorta: Secondary | ICD-10-CM | POA: Diagnosis not present

## 2021-01-15 DIAGNOSIS — R222 Localized swelling, mass and lump, trunk: Secondary | ICD-10-CM | POA: Diagnosis not present

## 2021-01-15 DIAGNOSIS — J9859 Other diseases of mediastinum, not elsewhere classified: Secondary | ICD-10-CM | POA: Insufficient documentation

## 2021-01-15 DIAGNOSIS — J984 Other disorders of lung: Secondary | ICD-10-CM | POA: Diagnosis not present

## 2021-01-20 ENCOUNTER — Telehealth: Payer: Self-pay | Admitting: *Deleted

## 2021-01-20 NOTE — Telephone Encounter (Signed)
Left voicemail message to call back for results.  

## 2021-01-20 NOTE — Telephone Encounter (Signed)
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 Unchanged compared to previous. Great result! Per radiology recommendations - no need for repeat imaging as it has been stable.

## 2021-01-21 ENCOUNTER — Ambulatory Visit: Payer: Medicare Other | Admitting: Dermatology

## 2021-01-21 NOTE — Telephone Encounter (Signed)
Spoke with patient and she did review results via My Chart. She reports that she had to cancel appointment due to her husband being in the hospital and some skin cancer she is trying to deal with. Advised to please call us back when she is able to reschedule and she verbalized understanding with no further questions at this time.

## 2021-01-26 ENCOUNTER — Telehealth: Payer: Self-pay | Admitting: Internal Medicine

## 2021-01-26 DIAGNOSIS — H6063 Unspecified chronic otitis externa, bilateral: Secondary | ICD-10-CM | POA: Diagnosis not present

## 2021-01-26 DIAGNOSIS — D105 Benign neoplasm of other parts of oropharynx: Secondary | ICD-10-CM | POA: Diagnosis not present

## 2021-01-26 NOTE — Telephone Encounter (Signed)
I received a written request for palliative care for patient , apparently requested by daughter [name].  I did not sign it because I am not aware that patient has a terminal illness or chronic pain requiring palliative care.  She is encouraged to schedule a visit to discuss

## 2021-01-27 NOTE — Telephone Encounter (Signed)
Spoke with pt's daughter [name] and she stated that the patient does not have any terminal illness or chronic pain. She stated that she would call back and schedule a sooner appt if she felt like her mother needed to be evaluated for palliative care. Daughter stated that she has been having to help with her father and the nurse that comes out for him was talking to [name] about different services and she thought she would try palliative care.

## 2021-03-20 ENCOUNTER — Other Ambulatory Visit: Payer: Self-pay | Admitting: Family

## 2021-03-20 DIAGNOSIS — K449 Diaphragmatic hernia without obstruction or gangrene: Secondary | ICD-10-CM

## 2021-03-20 NOTE — Telephone Encounter (Signed)
Rx request sent to pharmacy.  

## 2021-03-25 ENCOUNTER — Ambulatory Visit (INDEPENDENT_AMBULATORY_CARE_PROVIDER_SITE_OTHER): Payer: Medicare Other | Admitting: Internal Medicine

## 2021-03-25 ENCOUNTER — Other Ambulatory Visit: Payer: Self-pay

## 2021-03-25 ENCOUNTER — Encounter: Payer: Self-pay | Admitting: Internal Medicine

## 2021-03-25 VITALS — BP 128/66 | HR 62 | Temp 96.4°F | Resp 14 | Ht 67.0 in | Wt 138.4 lb

## 2021-03-25 DIAGNOSIS — E559 Vitamin D deficiency, unspecified: Secondary | ICD-10-CM

## 2021-03-25 DIAGNOSIS — E039 Hypothyroidism, unspecified: Secondary | ICD-10-CM

## 2021-03-25 DIAGNOSIS — F5101 Primary insomnia: Secondary | ICD-10-CM

## 2021-03-25 DIAGNOSIS — Z800 Family history of malignant neoplasm of digestive organs: Secondary | ICD-10-CM | POA: Diagnosis not present

## 2021-03-25 DIAGNOSIS — E7849 Other hyperlipidemia: Secondary | ICD-10-CM | POA: Diagnosis not present

## 2021-03-25 DIAGNOSIS — Z9229 Personal history of other drug therapy: Secondary | ICD-10-CM

## 2021-03-25 DIAGNOSIS — Z1231 Encounter for screening mammogram for malignant neoplasm of breast: Secondary | ICD-10-CM | POA: Diagnosis not present

## 2021-03-25 DIAGNOSIS — Z0000 Encounter for general adult medical examination without abnormal findings: Secondary | ICD-10-CM | POA: Diagnosis not present

## 2021-03-25 DIAGNOSIS — I10 Essential (primary) hypertension: Secondary | ICD-10-CM

## 2021-03-25 DIAGNOSIS — R7303 Prediabetes: Secondary | ICD-10-CM

## 2021-03-25 LAB — LIPID PANEL
Cholesterol: 160 mg/dL (ref 0–200)
HDL: 51.8 mg/dL (ref >39.00)
LDL Cholesterol: 84 mg/dL (ref 0–99)
NonHDL: 108.33
Total CHOL/HDL Ratio: 3
Triglycerides: 121 mg/dL (ref 0.0–149.0)
VLDL: 24.2 mg/dL (ref 0.0–40.0)

## 2021-03-25 LAB — TSH: TSH: 1.89 u[IU]/mL (ref 0.35–4.50)

## 2021-03-25 LAB — COMPREHENSIVE METABOLIC PANEL
ALT: 16 U/L (ref 0–35)
AST: 19 U/L (ref 0–37)
Albumin: 4.2 g/dL (ref 3.5–5.2)
Alkaline Phosphatase: 38 U/L — ABNORMAL LOW (ref 39–117)
BUN: 12 mg/dL (ref 6–23)
CO2: 32 mEq/L (ref 19–32)
Calcium: 9.4 mg/dL (ref 8.4–10.5)
Chloride: 102 mEq/L (ref 96–112)
Creatinine, Ser: 0.86 mg/dL (ref 0.40–1.20)
GFR: 64.06 mL/min (ref >60.00)
Glucose, Bld: 89 mg/dL (ref 70–99)
Potassium: 4.4 mEq/L (ref 3.5–5.1)
Sodium: 140 mEq/L (ref 135–145)
Total Bilirubin: 0.4 mg/dL (ref 0.2–1.2)
Total Protein: 6.7 g/dL (ref 6.0–8.3)

## 2021-03-25 LAB — HEMOGLOBIN A1C: Hgb A1c MFr Bld: 5.9 % (ref 4.6–6.5)

## 2021-03-25 LAB — VITAMIN D 25 HYDROXY (VIT D DEFICIENCY, FRACTURES): VITD: 49.72 ng/mL (ref 30.00–100.00)

## 2021-03-25 NOTE — Progress Notes (Signed)
Patient ID: [name], female    DOB: October 09, 1941  Age: 80 y.o. MRN: [id number]   The patient is here for annual wellness examination and management of other chronic and acute problems.  This visit occurred during the SARS-CoV-2 public health emergency.  Safety protocols were in place, including screening questions prior to the visit, additional usage of staff PPE, and extensive cleaning of exam room while observing appropriate contact time as indicated for disinfecting solutions.     The risk factors are reflected in the social history.  The roster of all physicians providing medical care to patient - is listed in the Snapshot section of the chart.  Activities of daily living:  The patient is 100% independent in all ADLs: dressing, toileting, feeding as well as independent mobility  Home safety : The patient has smoke detectors in the home. They wear seatbelts.  There are no firearms at home. There is no violence in the home.   There is no risks for hepatitis, STDs or HIV. There is no   history of blood transfusion. They have no travel history to infectious disease endemic areas of the world.  The patient has seen their dentist in the last six month. They have seen their eye doctor in the last year. They deny hearing difficulty with regard to whispered voices and some television programs.  They have deferred audiologic testing in the last year.  They do not  have excessive sun exposure. Discussed the need for sun protection: hats, long sleeves and use of sunscreen if there is significant sun exposure.   Diet: the importance of a healthy diet is discussed. They do have a healthy diet.  The benefits of regular aerobic exercise were discussed. She walks 4 times per week ,  20 minutes.   Depression screen: there are no signs or vegative symptoms of depression- irritability, change in appetite, anhedonia, sadness/tearfullness.  Cognitive assessment: the patient manages all their financial  and personal affairs and is actively engaged. They could relate day,date,year and events; recalled 2/3 objects at 3 minutes; performed clock-face test normally.  The following portions of the patient's history were reviewed and updated as appropriate: allergies, current medications, past family history, past medical history,  past surgical history, past social history  and problem list.  Visual acuity was not assessed per patient preference since she has regular follow up with her ophthalmologist. Hearing and body mass index were assessed and reviewed.   During the course of the visit the patient was educated and counseled about appropriate screening and preventive services including : fall prevention , diabetes screening, nutrition counseling, colorectal cancer screening, and recommended immunizations.    CC: The primary encounter diagnosis was Breast cancer screening by mammogram. Diagnoses of Familial hyperlipidemia, Essential hypertension, Prediabetes, Vitamin D deficiency, Acquired hypothyroidism, COVID-19 vaccine series completed, Primary insomnia, Family history of colon cancer, and Encounter for preventive health examination were also pertinent to this visit.   1) HTN:  Patient is taking her medications as prescribed and notes no adverse effects.  Home BP readings have been done about once per week and are  generally < 130/80 .  She is avoiding added salt in her diet and walking regularly about 3 times per week for exercise  .  2) chest wall pain during breast exam bilateral  3) nocturnal leg cramps occurring nightly   History [name] has a past medical history of Arthritis, Basal cell carcinoma (10/23/2020), Hyperlipidemia, Hypothyroidism, and PVC's (premature ventricular contractions).   She  has a past surgical history that includes Abdominal hysterectomy; Vaginal prolapse repair (2014); Colonoscopy with propofol (N/A, 12/29/2015); Brow lift (Bilateral, 07/12/2017); Ptosis repair  (Bilateral, 07/12/2017); Colonoscopy with propofol (N/A, 12/03/2019); and polypectomy (N/A, 12/03/2019).   Her family history includes Breast cancer in her mother and sister; Cancer in her brother, brother, brother, sister, and sister; Diabetes in her brother, brother, brother, father, and mother; Heart attack in her brother and mother; Pancreatic cancer in her sister; Stroke in her father.She reports that she has quit smoking. Her smoking use included cigarettes. She quit after 1.00 year of use. She has never used smokeless tobacco. She reports that she does not drink alcohol and does not use drugs.  Outpatient Medications Prior to Visit  Medication Sig Dispense Refill  . aspirin EC 81 MG tablet Take 81 mg by mouth daily.    . cholecalciferol (VITAMIN D) 1000 UNITS tablet Take 2,000 Units by mouth daily.     Marland Kitchen diltiazem (CARDIZEM CD) 120 MG 24 hr capsule Take 1 capsule (120 mg total) by mouth daily. 90 capsule 3  . diphenhydramine-acetaminophen (TYLENOL PM) 25-500 MG TABS tablet Take 1 tablet by mouth at bedtime as needed.    Marland Kitchen glucosamine-chondroitin 500-400 MG tablet Take 1 tablet by mouth daily.     Marland Kitchen levothyroxine (SYNTHROID) 50 MCG tablet TAKE 1 TABLET BY MOUTH  DAILY 90 tablet 3  . mometasone (ELOCON) 0.1 % lotion SMARTSIG:2-4 Drop(s) In Ear(s) Every Night PRN    . Multiple Vitamins-Minerals (MULTIVITAMIN WITH MINERALS) tablet Take 1 tablet by mouth daily.    . Omega-3 Fatty Acids (FISH OIL) 1000 MG CPDR Take by mouth daily.    . pantoprazole (PROTONIX) 20 MG tablet TAKE 1 TABLET BY MOUTH EVERY DAY 90 tablet 1  . rosuvastatin (CRESTOR) 10 MG tablet TAKE 1 TABLET BY MOUTH  DAILY 90 tablet 3  . telmisartan (MICARDIS) 20 MG tablet TAKE 1 TABLET (20 MG TOTAL) BY MOUTH DAILY. IN THE MORNING FOR BLOOD PRESSURE 90 tablet 1  . traZODone (DESYREL) 50 MG tablet TAKE 0.5-1 TABLETS BY MOUTH AT BEDTIME AS NEEDED FOR SLEEP. 90 tablet 2  . mupirocin ointment (BACTROBAN) 2 % APPLY TO AFFECTED AREA TWICE A DAY  (Patient not taking: Reported on 03/25/2021) 22 g 0   No facility-administered medications prior to visit.    Review of Systems   Patient denies headache, fevers, malaise, unintentional weight loss, skin rash, eye pain, sinus congestion and sinus pain, sore throat, dysphagia,  hemoptysis , cough, dyspnea, wheezing, chest pain, palpitations, orthopnea, edema, abdominal pain, nausea, melena, diarrhea, constipation, flank pain, dysuria, hematuria, urinary  Frequency, nocturia, numbness, tingling, seizures,  Focal weakness, Loss of consciousness,  Tremor, insomnia, depression, anxiety, and suicidal ideation.     Objective:  BP 128/66 (BP Location: Left Arm, Patient Position: Sitting, Cuff Size: Normal)   Pulse 62   Temp (!) 96.4 F (35.8 C) (Temporal)   Resp 14   Ht 5\' 7"  (1.702 m)   Wt 138 lb 6.4 oz (62.8 kg)   SpO2 96%   BMI 21.68 kg/m   Physical Exam  General appearance: alert, cooperative and appears stated age Head: Normocephalic, without obvious abnormality, atraumatic Eyes: conjunctivae/corneas clear. PERRL, EOM's intact. Fundi benign. Ears: normal TM's and external ear canals both ears Nose: Nares normal. Septum midline. Mucosa normal. No drainage or sinus tenderness. Throat: lips, mucosa, and tongue normal; teeth and gums normal Neck: no adenopathy, no carotid bruit, no JVD, supple, symmetrical, trachea midline and thyroid not  enlarged, symmetric, no tenderness/mass/nodules Lungs: clear to auscultation bilaterally Breasts: normal appearance, no masses . Bilateral tenderness Heart: regular rate and rhythm, S1, S2 normal, no murmur, click, rub or gallop Abdomen: soft, non-tender; bowel sounds normal; no masses,  no organomegaly Extremities: extremities normal, atraumatic, no cyanosis or edema Pulses: 2+ and symmetric Skin: Skin color, texture, turgor normal. No rashes or lesions Neurologic: Alert and oriented X 3, normal strength and tone. Normal symmetric reflexes. Normal  coordination and gait.    Assessment & Plan:   Problem List Items Addressed This Visit      Unprioritized   Familial hyperlipidemia    Managed with crestor,  LDL is now 59 .  lfts normal.  No changes today   Lab Results  Component Value Date   CHOL 160 03/25/2021   HDL 51.80 03/25/2021   LDLCALC 84 03/25/2021   LDLDIRECT 85.0 03/06/2018   TRIG 121.0 03/25/2021   CHOLHDL 3 03/25/2021   Lab Results  Component Value Date   ALT 16 03/25/2021   AST 19 03/25/2021   ALKPHOS 38 (L) 03/25/2021   BILITOT 0.4 03/25/2021          Relevant Orders   Lipid panel (Completed)   Acquired hypothyroidism    TSH is therapeutic,  Continue current 50 MCG  levothyoxine dose,  Recheck in 6 months   Lab Results  Component Value Date   TSH 1.89 03/25/2021         Relevant Orders   TSH (Completed)   Vitamin D deficiency   Relevant Orders   VITAMIN D 25 Hydroxy (Vit-D Deficiency, Fractures) (Completed)   Family history of colon cancer    Last colonoscopy Jan 2021 : a 3 mm tubular adenoma was removed.       Encounter for preventive health examination    age appropriate education and counseling updated, referrals for preventative services and immunizations addressed, dietary and smoking counseling addressed, most recent labs reviewed.  I have personally reviewed and have noted:  1) the patient's medical and social history 2) The pt's use of alcohol, tobacco, and illicit drugs 3) The patient's current medications and supplements 4) Functional ability including ADL's, fall risk, home safety risk, hearing and visual impairment 5) Diet and physical activities 6) Evidence for depression or mood disorder 7) The patient's height, weight, and BMI have been recorded in the chart  I have made referrals, and provided counseling and education based on review of the above      Prediabetes   Relevant Orders   Hemoglobin A1c (Completed)   Essential hypertension    Well controlled on current  regimen. Renal function stable, no changes today.      Relevant Orders   Comprehensive metabolic panel (Completed)   Insomnia disorder    Managed with  melatonin after dinner and qhs trazodone prn       Other Visit Diagnoses    Breast cancer screening by mammogram    -  Primary   Relevant Orders   MM 3D SCREEN BREAST BILATERAL   COVID-19 vaccine series completed       Relevant Orders   SARS-CoV-2 Semi-Quantitative Total Antibody, Spike      I have discontinued Verdene Lennert. Santosuosso's mupirocin ointment. I am also having her maintain her aspirin EC, multivitamin with minerals, glucosamine-chondroitin, cholecalciferol, Fish Oil, diphenhydramine-acetaminophen, levothyroxine, rosuvastatin, traZODone, telmisartan, diltiazem, pantoprazole, and mometasone.  No orders of the defined types were placed in this encounter.   Medications Discontinued During This Encounter  Medication Reason  . mupirocin ointment (BACTROBAN) 2 %     Follow-up: Return in about 6 months (around 09/25/2021).   Sherlene Shams, MD

## 2021-03-25 NOTE — Patient Instructions (Signed)
I recommend a baby aspirin be taken every other day as preventive  Given your history of aortic atherosclerosis   Your annual mammogram has been ordered.  You are encouraged (required) to call to make your appointment at [facility]  DEXA due next year  Up to date on all vaccines   Health Maintenance After Age 80 After age 43, you are at a higher risk for certain long-term diseases and infections as well as injuries from falls. Falls are a major cause of broken bones and head injuries in people who are older than age 21. Getting regular preventive care can help to keep you healthy and well. Preventive care includes getting regular testing and making lifestyle changes as recommended by your health care provider. Talk with your health care provider about:  Which screenings and tests you should have. A screening is a test that checks for a disease when you have no symptoms.  A diet and exercise plan that is right for you. What should I know about screenings and tests to prevent falls? Screening and testing are the best ways to find a health problem early. Early diagnosis and treatment give you the best chance of managing medical conditions that are common after age 67. Certain conditions and lifestyle choices may make you more likely to have a fall. Your health care provider may recommend:  Regular vision checks. Poor vision and conditions such as cataracts can make you more likely to have a fall. If you wear glasses, make sure to get your prescription updated if your vision changes.  Medicine review. Work with your health care provider to regularly review all of the medicines you are taking, including over-the-counter medicines. Ask your health care provider about any side effects that may make you more likely to have a fall. Tell your health care provider if any medicines that you take make you feel dizzy or sleepy.  Osteoporosis screening. Osteoporosis is a condition that causes the  bones to get weaker. This can make the bones weak and cause them to break more easily.  Blood pressure screening. Blood pressure changes and medicines to control blood pressure can make you feel dizzy.  Strength and balance checks. Your health care provider may recommend certain tests to check your strength and balance while standing, walking, or changing positions.  Foot health exam. Foot pain and numbness, as well as not wearing proper footwear, can make you more likely to have a fall.  Depression screening. You may be more likely to have a fall if you have a fear of falling, feel emotionally low, or feel unable to do activities that you used to do.  Alcohol use screening. Using too much alcohol can affect your balance and may make you more likely to have a fall. What actions can I take to lower my risk of falls? General instructions  Talk with your health care provider about your risks for falling. Tell your health care provider if: ? You fall. Be sure to tell your health care provider about all falls, even ones that seem minor. ? You feel dizzy, sleepy, or off-balance.  Take over-the-counter and prescription medicines only as told by your health care provider. These include any supplements.  Eat a healthy diet and maintain a healthy weight. A healthy diet includes low-fat dairy products, low-fat (lean) meats, and fiber from whole grains, beans, and lots of fruits and vegetables. Home safety  Remove any tripping hazards, such as rugs, cords, and clutter.  Install  safety equipment such as grab bars in bathrooms and safety rails on stairs.  Keep rooms and walkways well-lit. Activity  Follow a regular exercise program to stay fit. This will help you maintain your balance. Ask your health care provider what types of exercise are appropriate for you.  If you need a cane or walker, use it as recommended by your health care provider.  Wear supportive shoes that have nonskid soles.    Lifestyle  Do not drink alcohol if your health care provider tells you not to drink.  If you drink alcohol, limit how much you have: ? 0-1 drink a day for women. ? 0-2 drinks a day for men.  Be aware of how much alcohol is in your drink. In the [place] one drink equals one typical bottle of beer (12 oz), one-half glass of wine (5 oz), or one shot of hard liquor (1 oz).  Do not use any products that contain nicotine or tobacco, such as cigarettes and e-cigarettes. If you need help quitting, ask your health care provider. Summary  Having a healthy lifestyle and getting preventive care can help to protect your health and wellness after age 70.  Screening and testing are the best way to find a health problem early and help you avoid having a fall. Early diagnosis and treatment give you the best chance for managing medical conditions that are more common for people who are older than age 52.  Falls are a major cause of broken bones and head injuries in people who are older than age 50. Take precautions to prevent a fall at home.  Work with your health care provider to learn what changes you can make to improve your health and wellness and to prevent falls. This information is not intended to replace advice given to you by your health care provider. Make sure you discuss any questions you have with your health care provider. Document Revised: 03/01/2019 Document Reviewed: 09/21/2017 Elsevier Patient Education  2021 [employer].

## 2021-03-26 NOTE — Assessment & Plan Note (Signed)
Managed with crestor,  LDL is now 84 .  lfts normal.  No changes today   Lab Results  Component Value Date   CHOL 160 03/25/2021   HDL 51.80 03/25/2021   LDLCALC 84 03/25/2021   LDLDIRECT 85.0 03/06/2018   TRIG 121.0 03/25/2021   CHOLHDL 3 03/25/2021   Lab Results  Component Value Date   ALT 16 03/25/2021   AST 19 03/25/2021   ALKPHOS 38 (L) 03/25/2021   BILITOT 0.4 03/25/2021

## 2021-03-26 NOTE — Assessment & Plan Note (Signed)
Well controlled on current regimen. Renal function stable, no changes today. 

## 2021-03-26 NOTE — Assessment & Plan Note (Addendum)
TSH is therapeutic,  Continue current 50 MCG  levothyoxine dose,  Recheck in 6 months   Lab Results  Component Value Date   TSH 1.89 03/25/2021

## 2021-03-26 NOTE — Assessment & Plan Note (Addendum)
Last colonoscopy Jan 2021 : a 3 mm tubular adenoma was removed.

## 2021-03-26 NOTE — Assessment & Plan Note (Signed)
Managed with  melatonin after dinner and qhs trazodone prn

## 2021-03-26 NOTE — Assessment & Plan Note (Signed)

## 2021-03-31 LAB — SARS-COV-2 SEMI-QUANTITATIVE TOTAL ANTIBODY, SPIKE: SARS COV2 AB, Total Spike Semi QN: >2500 U/mL — ABNORMAL HIGH (ref <0.8)

## 2021-04-02 ENCOUNTER — Ambulatory Visit
Admission: RE | Admit: 2021-04-02 | Discharge: 2021-04-02 | Disposition: A | Discharge status: Home / Self Care | Payer: Medicare Other | Source: Ambulatory Visit | Attending: Internal Medicine | Admitting: Internal Medicine

## 2021-04-02 ENCOUNTER — Other Ambulatory Visit: Payer: Self-pay

## 2021-04-02 DIAGNOSIS — Z1231 Encounter for screening mammogram for malignant neoplasm of breast: Secondary | ICD-10-CM | POA: Diagnosis not present

## 2021-04-22 ENCOUNTER — Encounter: Payer: Self-pay | Admitting: Adult Health

## 2021-04-22 ENCOUNTER — Telehealth (INDEPENDENT_AMBULATORY_CARE_PROVIDER_SITE_OTHER): Payer: Medicare Other | Admitting: Adult Health

## 2021-04-22 VITALS — BP 96/53 | HR 69 | Ht 67.01 in | Wt 132.0 lb

## 2021-04-22 DIAGNOSIS — E785 Hyperlipidemia, unspecified: Secondary | ICD-10-CM | POA: Diagnosis not present

## 2021-04-22 DIAGNOSIS — E861 Hypovolemia: Secondary | ICD-10-CM | POA: Diagnosis not present

## 2021-04-22 DIAGNOSIS — K921 Melena: Secondary | ICD-10-CM | POA: Diagnosis not present

## 2021-04-22 DIAGNOSIS — R1030 Lower abdominal pain, unspecified: Secondary | ICD-10-CM | POA: Diagnosis not present

## 2021-04-22 DIAGNOSIS — R1031 Right lower quadrant pain: Secondary | ICD-10-CM | POA: Diagnosis not present

## 2021-04-22 DIAGNOSIS — R5383 Other fatigue: Secondary | ICD-10-CM | POA: Diagnosis not present

## 2021-04-22 DIAGNOSIS — R1110 Vomiting, unspecified: Secondary | ICD-10-CM | POA: Diagnosis not present

## 2021-04-22 DIAGNOSIS — R1032 Left lower quadrant pain: Secondary | ICD-10-CM | POA: Diagnosis not present

## 2021-04-22 DIAGNOSIS — R197 Diarrhea, unspecified: Secondary | ICD-10-CM | POA: Diagnosis not present

## 2021-04-22 DIAGNOSIS — I9589 Other hypotension: Secondary | ICD-10-CM

## 2021-04-22 DIAGNOSIS — R531 Weakness: Secondary | ICD-10-CM | POA: Diagnosis not present

## 2021-04-22 DIAGNOSIS — Z79899 Other long term (current) drug therapy: Secondary | ICD-10-CM | POA: Diagnosis not present

## 2021-04-22 DIAGNOSIS — Z7982 Long term (current) use of aspirin: Secondary | ICD-10-CM | POA: Diagnosis not present

## 2021-04-22 NOTE — Progress Notes (Signed)
Virtual Visit via Video Note  I connected with [name] on 04/22/21 at  4:30 PM EDT by a video enabled telemedicine application and verified that I am speaking with the correct person using two identifiers.  Parties involved in visit as below:   Location: Patient: at home Provider: Provider: Provider's office at  [facility], [place].      I discussed the limitations of evaluation and management by telemedicine and the availability of in person appointments. The patient expressed understanding and agreed to proceed.  History of Present Illness: Patient started with diarrhea on 04/12/21 . She had vomiting that day she vomited for about an hour. She reports she had diarrhea initially until Friday 04/17/21.  She reports she had some bright red bleeding that day she suspected from her hemorrhoids.   She reports diarrhea again on 04/19/21 she started with diarrhea and reports it has coffee ground material, and watery diarrhea. Denies any red bleeding. She does report stool " darker than she has ever seen come out of her". She has continued to have lower abdominal pain across lower abdomen, denies any other pain. Nausea.  Denies any trauma.   On asa 81 mg daily no other anticoagulation.  Feels weak and blood pressure is hypotensive, has not been able to eat or drink much of anything this week she reports.   Patient  denies any fever, body aches,chills, rash, chest pain, shortness of breath.  Denies dizziness, lightheadedness, pre syncopal or syncopal episodes.       Observations/Objective: Blood pressure (!) 96/53, pulse 69, height 5' 7.01" (1.702 m), weight 132 lb (59.9 kg).  Vitals with BMI 04/22/2021 03/25/2021 12/22/2020  Height 5' 7.008" 5\' 7"  5\' 7"   Weight 132 lbs 138 lbs 6 oz 142 lbs  BMI 20.67 58.85 02.77  Systolic 96 412 878  Diastolic 53 66 66  Pulse 69 62 69   Filed Weights   04/22/21 1611  Weight: 132 lb (59.9 kg)  last weight was  138 at last office visit, 03/25/21 with Dr. Derrel Nip.    Patient is alert and oriented and responsive to questions Engages in conversation with provider. Speaks in full sentences without any pauses without any shortness of breath or distress. She does appear weak. Daughter is with her.  Assessment and Plan:  The primary encounter diagnosis was Blood in stool. Diagnoses of Black tarry stools, Hypotension due to hypovolemia, Diarrhea, unspecified type, and Fatigue, unspecified type were also pertinent to this visit.   She and daughter advised to go to the emergency room now she is hypotensive, has coffee ground stools, fatigue and history of bright red bleeding 2 weeks ago very concerning for gastrointestinal bleeding.  Other differentials diverticulitis, c-difficile , infectious diarrhea.  Advise will need CT, hemoglobin/ hematocrit, likely stool culture and IV fluids to replenish volume loss.    She and daughter verbalized understanding of all instructions given and denies any further questions at this time.   Follow Up Instructions: Follow up after emergency room visit.   Return in about 3 days (around 04/25/2021), or if symptoms worsen or fail to improve, for at any time for any worsening symptoms, Go to Emergency room/ urgent care if worse. I discussed the assessment and treatment plan with the patient. The patient was provided an opportunity to ask questions and all were answered. The patient agreed with the plan and demonstrated an understanding of the instructions.   The patient was advised to call back or  seek an in-person evaluation if the symptoms worsen or if the condition fails to improve as anticipated.    Marcille Buffy, FNP

## 2021-04-22 NOTE — Patient Instructions (Signed)

## 2021-05-13 ENCOUNTER — Ambulatory Visit: Payer: Medicare Other | Admitting: Dermatology

## 2021-05-13 ENCOUNTER — Encounter: Payer: Self-pay | Admitting: Dermatology

## 2021-05-13 ENCOUNTER — Other Ambulatory Visit: Payer: Self-pay

## 2021-05-13 DIAGNOSIS — C44319 Basal cell carcinoma of skin of other parts of face: Secondary | ICD-10-CM | POA: Diagnosis not present

## 2021-05-13 DIAGNOSIS — Z86018 Personal history of other benign neoplasm: Secondary | ICD-10-CM

## 2021-05-13 DIAGNOSIS — L820 Inflamed seborrheic keratosis: Secondary | ICD-10-CM | POA: Diagnosis not present

## 2021-05-13 DIAGNOSIS — D492 Neoplasm of unspecified behavior of bone, soft tissue, and skin: Secondary | ICD-10-CM

## 2021-05-13 DIAGNOSIS — C44712 Basal cell carcinoma of skin of right lower limb, including hip: Secondary | ICD-10-CM | POA: Diagnosis not present

## 2021-05-13 DIAGNOSIS — L821 Other seborrheic keratosis: Secondary | ICD-10-CM

## 2021-05-13 DIAGNOSIS — D229 Melanocytic nevi, unspecified: Secondary | ICD-10-CM | POA: Diagnosis not present

## 2021-05-13 DIAGNOSIS — D1800 Hemangioma unspecified site: Secondary | ICD-10-CM

## 2021-05-13 DIAGNOSIS — Z85828 Personal history of other malignant neoplasm of skin: Secondary | ICD-10-CM

## 2021-05-13 DIAGNOSIS — B070 Plantar wart: Secondary | ICD-10-CM

## 2021-05-13 DIAGNOSIS — C4491 Basal cell carcinoma of skin, unspecified: Secondary | ICD-10-CM

## 2021-05-13 DIAGNOSIS — L814 Other melanin hyperpigmentation: Secondary | ICD-10-CM | POA: Diagnosis not present

## 2021-05-13 DIAGNOSIS — D2261 Melanocytic nevi of right upper limb, including shoulder: Secondary | ICD-10-CM

## 2021-05-13 DIAGNOSIS — Z1283 Encounter for screening for malignant neoplasm of skin: Secondary | ICD-10-CM

## 2021-05-13 DIAGNOSIS — L570 Actinic keratosis: Secondary | ICD-10-CM | POA: Diagnosis not present

## 2021-05-13 DIAGNOSIS — L578 Other skin changes due to chronic exposure to nonionizing radiation: Secondary | ICD-10-CM | POA: Diagnosis not present

## 2021-05-13 HISTORY — DX: Basal cell carcinoma of skin, unspecified: C44.91

## 2021-05-13 HISTORY — DX: Personal history of other benign neoplasm: Z86.018

## 2021-05-13 MED ORDER — FLUOROURACIL 5 % EX CREA: TOPICAL_CREAM | Freq: Two times a day (BID) | CUTANEOUS | 0 refills | Status: DC

## 2021-05-13 NOTE — Progress Notes (Addendum)
Follow-Up Visit   Subjective  [name] is a 80 y.o. female who presents for the following: Annual Exam (Here for skin cancer screening. Full body. [facility]. Right lateral ankle EDC: 12/18/20) and lesion (Check pink spot on left cheek. Concerned could be skin cancer.  Painful spot on left foot. Feels like piece of glass when wearing certain shoes.).  The following portions of the chart were reviewed this encounter and updated as appropriate:  Tobacco  Allergies  Meds  Problems  Med Hx  Surg Hx  Fam Hx       Review of Systems: No other skin or systemic complaints except as noted in HPI or Assessment and Plan.  Objective  Well appearing patient in no apparent distress; mood and affect are within normal limits.  A full examination was performed including scalp, head, eyes, ears, nose, lips, neck, chest, axillae, abdomen, back, buttocks, bilateral upper extremities, bilateral lower extremities, hands, feet, fingers, toes, fingernails, and toenails. All findings within normal limits unless otherwise noted below.  Left Forehead 0.6 cm thin pink papule      Right Dorsal Hand 0.2 mm irregular dark brown thin papule     Right Pretibia 1.4 cm scaly pink and brown plaque     Left Zygomatic Area, Right Medial Thigh x1, left medial ankle x1 (2) Very thin pink plaque at left zygoma without features suspicious for malignancy on dermoscopy.  Scaly pink plaque right thigh, left ankle  Left Hallux Metatarsal Plantar Area Scaly firm papule  Left Upper Back x1 Erythematous keratotic or waxy stuck-on papule or plaque.   Assessment & Plan  Neoplasm of skin (3) Left Forehead  Skin / nail biopsy Type of biopsy: tangential   Informed consent: discussed and consent obtained   Timeout: patient name, date of birth, surgical site, and procedure verified   Procedure prep:  Patient was prepped and draped in usual sterile fashion Prep type:  Isopropyl alcohol Anesthesia: the  lesion was anesthetized in a standard fashion   Anesthetic:  1% lidocaine w/ epinephrine 1-100,000 buffered w/ 8.4% NaHCO3 Instrument used: flexible razor blade   Hemostasis achieved with: pressure, aluminum chloride and electrodesiccation   Outcome: patient tolerated procedure well   Post-procedure details: sterile dressing applied and wound care instructions given   Dressing type: bandage and petrolatum    Specimen 1 - Surgical pathology Differential Diagnosis: R/O BCC  Check Margins: No 2 pieces in bottle  Right Dorsal Hand  Epidermal / dermal shaving  Lesion diameter (cm):  0.2 Informed consent: discussed and consent obtained   Timeout: patient name, date of birth, surgical site, and procedure verified   Anesthesia: the lesion was anesthetized in a standard fashion   Anesthetic:  1% lidocaine w/ epinephrine 1-100,000 local infiltration Instrument used: flexible razor blade   Hemostasis achieved with: aluminum chloride   Outcome: patient tolerated procedure well   Post-procedure details: wound care instructions given   Additional details:  Mupirocin and a bandage applied  Specimen 2 - Surgical pathology Differential Diagnosis: R/O Atypia  Check Margins: No  Right Pretibia  Skin / nail biopsy Type of biopsy: tangential   Informed consent: discussed and consent obtained   Timeout: patient name, date of birth, surgical site, and procedure verified   Procedure prep:  Patient was prepped and draped in usual sterile fashion Prep type:  Isopropyl alcohol Anesthesia: the lesion was anesthetized in a standard fashion   Anesthetic:  1% lidocaine w/ epinephrine 1-100,000 buffered w/ 8.4% NaHCO3 Instrument  used: flexible razor blade   Hemostasis achieved with: pressure, aluminum chloride and electrodesiccation   Outcome: patient tolerated procedure well   Post-procedure details: sterile dressing applied and wound care instructions given   Dressing type: bandage and petrolatum     Specimen 3 - Surgical pathology Differential Diagnosis: R/O BCC vs atypia  Check Margins: No  AK (actinic keratosis) (3) Right Medial Thigh x1, left medial ankle x1 (2); Left Zygomatic Area  Actinic keratoses are precancerous spots that appear secondary to cumulative UV radiation exposure/sun exposure over time. They are chronic with expected duration over 1 year. A portion of actinic keratoses will progress to squamous cell carcinoma of the skin. It is not possible to reliably predict which spots will progress to skin cancer and so treatment is recommended to prevent development of skin cancer.  Recommend daily broad spectrum sunscreen SPF 30+ to sun-exposed areas, reapply every 2 hours as needed.  Recommend staying in the shade or wearing long sleeves, sun glasses (UVA+UVB protection) and wide brim hats (4-inch brim around the entire circumference of the hat). Call for new or changing lesions.  Start 5-fluorouracil/calcipotriene cream twice a day for 4 days to affected areas including left zygoma. Prescription sent to [facility]. Patient provided with contact information for pharmacy and advised the pharmacy will mail the prescription to their home. Patient provided with handout reviewing treatment course and side effects and advised to call or message [place] on MyChart with any concerns.    Destruction of lesion - Right Medial Thigh x1, left medial ankle x1  Destruction method: cryotherapy   Informed consent: discussed and consent obtained   Lesion destroyed using liquid nitrogen: Yes   Outcome: patient tolerated procedure well with no complications   Post-procedure details: wound care instructions given    fluorouracil (EFUDEX) 5 % cream - Left Zygomatic Area Apply topically 2 (two) times daily. For 4 days  Plantar wart Left Hallux Metatarsal Plantar Area  Verruca favored >AK or other  Discussed viral etiology and risk of spread.  Discussed multiple treatments may be  required to clear warts.  Discussed possible post-treatment dyspigmentation and risk of recurrence.  Call for worsening  Destruction of lesion - Left Hallux Metatarsal Plantar Area  Destruction method: cryotherapy   Informed consent: discussed and consent obtained   Lesion destroyed using liquid nitrogen: Yes   Outcome: patient tolerated procedure well with no complications   Post-procedure details: wound care instructions given    Inflamed seborrheic keratosis Left Upper Back x1  Prior to procedure, discussed risks of blister formation, small wound, skin dyspigmentation, or rare scar following cryotherapy. Recommend Vaseline ointment to treated areas while healing.   Destruction of lesion - Left Upper Back x1  Destruction method: cryotherapy   Informed consent: discussed and consent obtained   Lesion destroyed using liquid nitrogen: Yes   Outcome: patient tolerated procedure well with no complications   Post-procedure details: wound care instructions given         Lentigines - Scattered tan macules - Due to sun exposure - Benign-appering, observe - Recommend daily broad spectrum sunscreen SPF 30+ to sun-exposed areas, reapply every 2 hours as needed. - Call for any changes  Seborrheic Keratoses - Stuck-on, waxy, tan-brown papules and/or plaques  - Benign-appearing - Discussed benign etiology and prognosis. - Observe - Call for any changes  Melanocytic Nevi - Tan-brown and/or pink-flesh-colored symmetric macules and papules - Benign appearing on exam today - Observation - Call clinic for new or changing moles -  Recommend daily use of broad spectrum spf 30+ sunscreen to sun-exposed areas.   Hemangiomas - Red papules - Discussed benign nature - Observe - Call for any changes  Actinic Damage - Chronic condition, secondary to cumulative UV/sun exposure - diffuse scaly erythematous macules with underlying dyspigmentation - Recommend daily broad spectrum sunscreen  SPF 30+ to sun-exposed areas, reapply every 2 hours as needed.  - Staying in the shade or wearing long sleeves, sun glasses (UVA+UVB protection) and wide brim hats (4-inch brim around the entire circumference of the hat) are also recommended for sun protection.  - Call for new or changing lesions.  Skin cancer screening performed today.  History of Basal Cell Carcinoma of the Skin at right lateral ankle. - No evidence of recurrence today - Recommend regular full body skin exams - Recommend daily broad spectrum sunscreen SPF 30+ to sun-exposed areas, reapply every 2 hours as needed.  - Call if any new or changing lesions are noted between office visits   Return in about 6 months (around 11/12/2021) for TBSE.  I, Emelia Salisbury, CMA, am acting as scribe for Forest Gleason, MD.  Documentation: I have reviewed the above documentation for accuracy and completeness, and I agree with the above.  Forest Gleason, MD

## 2021-05-13 NOTE — Patient Instructions (Addendum)
Recommend Niacinamide or Nicotinamide 500mg  twice per day to lower risk of non-melanoma skin cancer by approximately 25%. This is usually available at [employer].  Recommend taking Heliocare sun protection supplement daily in sunny weather for additional sun protection. For maximum protection on the sunniest days, you can take up to 2 capsules of regular Heliocare OR take 1 capsule of Heliocare Ultra. For prolonged exposure (such as a full day in the sun), you can repeat your dose of the supplement 4 hours after your first dose. Heliocare can be purchased at [facility] or at [email or web address].        5-Fluorouracil/Calcipotriene Patient Education   Actinic keratoses are the dry, red scaly spots on the skin caused by sun damage. A portion of these spots can turn into skin cancer with time, and treating them can help prevent development of skin cancer.   Treatment of these spots requires removal of the defective skin cells. There are various ways to remove actinic keratoses, including freezing with liquid nitrogen, treatment with creams, or treatment with a blue light procedure in the office.   5-fluorouracil cream is a topical cream used to treat actinic keratoses. It works by interfering with the growth of abnormal fast-growing skin cells, such as actinic keratoses. These cells peel off and are replaced by healthy ones.   5-fluorouracil/calcipotriene is a combination of the 5-fluorouracil cream with a vitamin D analog cream called calcipotriene. The calcipotriene alone does not treat actinic keratoses. However, when it is combined with 5-fluorouracil, it helps the 5-fluorouracil treat the actinic keratoses much faster so that the same results can be achieved with a much shorter treatment time.  INSTRUCTIONS FOR 5-FLUOROURACIL/CALCIPOTRIENE CREAM:   5-fluorouracil/calcipotriene cream typically only needs to be used for 4-7 days. A thin layer should be applied twice a day to the  treatment areas recommended by your physician.   If your physician prescribed you separate tubes of 5-fluourouracil and calcipotriene, apply a thin layer of 5-fluorouracil followed by a thin layer of calcipotriene.   Avoid contact with your eyes, nostrils, and mouth. Do not use 5-fluorouracil/calcipotriene cream on infected or open wounds.   You will develop redness, irritation and some crusting at areas where you have pre-cancer damage/actinic keratoses. IF YOU DEVELOP PAIN, BLEEDING, OR SIGNIFICANT CRUSTING, STOP THE TREATMENT EARLY - you have already gotten a good response and the actinic keratoses should clear up well.  Wash your hands after applying 5-fluorouracil 5% cream on your skin.   A moisturizer or sunscreen with a minimum SPF 30 should be applied each morning.   Once you have finished the treatment, you can apply a thin layer of Vaseline twice a day to irritated areas to soothe and calm the areas more quickly. If you experience significant discomfort, contact your physician.  For some patients it is necessary to repeat the treatment for best results.  SIDE EFFECTS: When using 5-fluorouracil/calcipotriene cream, you may have mild irritation, such as redness, dryness, swelling, or a mild burning sensation. This usually resolves within 2 weeks. The more actinic keratoses you have, the more redness and inflammation you can expect during treatment. Eye irritation has been reported rarely. If this occurs, please let us know.  If you have any trouble using this cream, please call the office. If you have any other questions about this information, please do not hesitate to ask me before you leave the office.   Melanoma ABCDEs  Melanoma is the most dangerous type of skin cancer, and is the  leading cause of death from skin disease.  You are more likely to develop melanoma if you: Have light-colored skin, light-colored eyes, or red or blond hair Spend a lot of time in the sun Tan  regularly, either outdoors or in a tanning bed Have had blistering sunburns, especially during childhood Have a close family member who has had a melanoma Have atypical moles or large birthmarks  Early detection of melanoma is key since treatment is typically straightforward and cure rates are extremely high if we catch it early.   The first sign of melanoma is often a change in a mole or a new dark spot.  The ABCDE system is a way of remembering the signs of melanoma.  A for asymmetry:  The two halves do not match. B for border:  The edges of the growth are irregular. C for color:  A mixture of colors are present instead of an even brown color. D for diameter:  Melanomas are usually (but not always) greater than 86mm - the size of a pencil eraser. E for evolution:  The spot keeps changing in size, shape, and color.  Please check your skin once per month between visits. You can use a small mirror in front and a large mirror behind you to keep an eye on the back side or your body.   If you see any new or changing lesions before your next follow-up, please call to schedule a visit.  Please continue daily skin protection including broad spectrum sunscreen SPF 30+ to sun-exposed areas, reapplying every 2 hours as needed when you're outdoors.   Staying in the shade or wearing long sleeves, sun glasses (UVA+UVB protection) and wide brim hats (4-inch brim around the entire circumference of the hat) are also recommended for sun protection.    Wound Care Instructions  Cleanse wound gently with soap and water once a day then pat dry with clean gauze. Apply a thin coat of Mupirocin over the wound (unless you have an allergy to this). We recommend that you use a new, sterile tube of Vaseline. Do not pick or remove scabs. Do not remove the yellow or white "healing tissue" from the base of the wound.  Cover the wound with fresh, clean, nonstick gauze and secure with paper tape. You may use Band-Aids in  place of gauze and tape if the would is small enough, but would recommend trimming much of the tape off as there is often too much. Sometimes Band-Aids can irritate the skin.  You should call the office for your biopsy report after 1 week if you have not already been contacted.  If you experience any problems, such as abnormal amounts of bleeding, swelling, significant bruising, significant pain, or evidence of infection, please call the office immediately.  FOR ADULT SURGERY PATIENTS: If you need something for pain relief you may take 1 extra strength Tylenol (acetaminophen) AND 2 Ibuprofen (200mg  each) together every 4 hours as needed for pain. (do not take these if you are allergic to them or if you have a reason you should not take them.) Typically, you may only need pain medication for 1 to 3 days.    If you have any questions or concerns for your doctor, please call our main line at [phone] and press option 4 to reach your doctor's medical assistant. If no one answers, please leave a voicemail as directed and we will return your call as soon as possible. Messages left after 4 pm will be answered the  following business day.   You may also send [place] a message via [place]. We typically respond to MyChart messages within 1-2 business days.  For prescription refills, please ask your pharmacy to contact our office. Our fax number is [phone].  If you have an urgent issue when the clinic is closed that cannot wait until the next business day, you can page your doctor at the number below.    Please note that while we do our best to be available for urgent issues outside of office hours, we are not available 24/7.   If you have an urgent issue and are unable to reach [place], you may choose to seek medical care at your doctor's office, retail clinic, urgent care center, or emergency room.  If you have a medical emergency, please immediately call 911 or go to the emergency department.  Pager  Numbers  - Dr. Nehemiah Massed: [phone]  - Dr. Laurence Ferrari: [phone]  - Dr. Nicole Kindred: [phone]  In the event of inclement weather, please call our main line at [phone] for an update on the status of any delays or closures.  Dermatology Medication Tips: Please keep the boxes that topical medications come in in order to help keep track of the instructions about where and how to use these. Pharmacies typically print the medication instructions only on the boxes and not directly on the medication tubes.   If your medication is too expensive, please contact our office at [phone] option 4 or send [place] a message through [place].   We are unable to tell what your co-pay for medications will be in advance as this is different depending on your insurance coverage. However, we may be able to find a substitute medication at lower cost or fill out paperwork to get insurance to cover a needed medication.   If a prior authorization is required to get your medication covered by your insurance company, please allow [place] 1-2 business days to complete this process.  Drug prices often vary depending on where the prescription is filled and some pharmacies may offer cheaper prices.  The website www.goodrx.com contains coupons for medications through different pharmacies. The prices here do not account for what the cost may be with help from insurance (it may be cheaper with your insurance), but the website can give you the price if you did not use any insurance.  - You can print the associated coupon and take it with your prescription to the pharmacy.  - You may also stop by our office during regular business hours and pick up a GoodRx coupon card.  - If you need your prescription sent electronically to a different pharmacy, notify our office through [facility] or by phone at [phone] option 4.

## 2021-05-15 ENCOUNTER — Other Ambulatory Visit: Payer: Self-pay | Admitting: Internal Medicine

## 2021-05-21 ENCOUNTER — Other Ambulatory Visit: Payer: Self-pay | Admitting: Internal Medicine

## 2021-05-28 ENCOUNTER — Other Ambulatory Visit: Payer: Self-pay

## 2021-05-28 ENCOUNTER — Encounter: Payer: Self-pay | Admitting: Gastroenterology

## 2021-05-28 ENCOUNTER — Ambulatory Visit (INDEPENDENT_AMBULATORY_CARE_PROVIDER_SITE_OTHER): Payer: Medicare Other | Admitting: Gastroenterology

## 2021-05-28 VITALS — BP 121/66 | HR 72 | Temp 97.5°F | Ht 67.0 in | Wt 136.4 lb

## 2021-05-28 DIAGNOSIS — K921 Melena: Secondary | ICD-10-CM

## 2021-05-28 DIAGNOSIS — R197 Diarrhea, unspecified: Secondary | ICD-10-CM | POA: Diagnosis not present

## 2021-05-28 DIAGNOSIS — C44319 Basal cell carcinoma of skin of other parts of face: Secondary | ICD-10-CM

## 2021-05-28 NOTE — H&P (View-Only) (Signed)
Primary Care Physician: [name], MD  Primary Gastroenterologist:  Dr. [name]  Chief Complaint  Patient presents with   Diarrhea    HPI: [name] is a 80 y.o. female here to see me for diarrhea.  The patient had seen me back in December 2020 for constipation with a family history of 2 sisters with colon cancer.  The patient underwent a colonoscopy in 2021 for the family history and the constipation.  At that time 1 polyp was found and was removed.  The patient had presented in May to her primary care provider with black tarry stools and diarrhea.  The diarrhea was reported to have started on 5/22 and at that time the patient also had about an hour of vomiting.  The patient was sent home from her primary care provider's visit with instructed to go to the emergency room if any further bleeding or symptomatic anemia should have occurred. The patient reports that her symptoms have completely come back to normal and she is not having any issues at the present time and is starting to gain her weight back.  The patient comes with her daughter today who reports that the patient has a lot of stress and is concerned about gastric ulcers.  The patient's constipation has returned and she is no longer having diarrhea.  The patient is not sure whether the black stools may have been from something she had eat or whether she may have had an upper GI bleed.  Past Medical History:  Diagnosis Date   Arthritis    knees   Basal cell carcinoma 10/23/2020   right lateral ankle EDC done 12/18/20   Basal cell carcinoma 05/13/2021   left forehead/ right pretibia   History of dysplastic nevus 05/13/2021   right dorsal hand, mild   Hyperlipidemia    Hypothyroidism    PVC's (premature ventricular contractions)    a.  48-hour Holter in 3/17 demonstrated NSR, frequent PVCs totaling 5300 beats representing 3% burden, occasional PACs, short runs of SVT    Current Outpatient Medications   Medication Sig Dispense Refill   aspirin EC 81 MG tablet Take 81 mg by mouth daily.     cholecalciferol (VITAMIN D) 1000 UNITS tablet Take 2,000 Units by mouth daily.      diltiazem (CARDIZEM CD) 120 MG 24 hr capsule Take 1 capsule (120 mg total) by mouth daily. 90 capsule 3   diphenhydramine-acetaminophen (TYLENOL PM) 25-500 MG TABS tablet Take 1 tablet by mouth at bedtime as needed.     fluorouracil (EFUDEX) 5 % cream Apply topically 2 (two) times daily. For 4 days 15 g 0   glucosamine-chondroitin 500-400 MG tablet Take 1 tablet by mouth daily.      levothyroxine (SYNTHROID) 50 MCG tablet TAKE 1 TABLET BY MOUTH  DAILY 90 tablet 3   mometasone (ELOCON) 0.1 % lotion SMARTSIG:2-4 Drop(s) In Ear(s) Every Night PRN     Multiple Vitamins-Minerals (MULTIVITAMIN WITH MINERALS) tablet Take 1 tablet by mouth daily.     Omega-3 Fatty Acids (FISH OIL) 1000 MG CPDR Take by mouth daily.     pantoprazole (PROTONIX) 20 MG tablet TAKE 1 TABLET BY MOUTH EVERY DAY 90 tablet 1   rosuvastatin (CRESTOR) 10 MG tablet TAKE 1 TABLET BY MOUTH  DAILY 90 tablet 3   telmisartan (MICARDIS) 20 MG tablet TAKE 1 TABLET (20 MG TOTAL) BY MOUTH DAILY. IN THE MORNING FOR BLOOD PRESSURE 90 tablet 1   traZODone (DESYREL) 50  MG tablet TAKE 0.5-1 TABLETS BY MOUTH AT BEDTIME AS NEEDED FOR SLEEP. 90 tablet 2   No current facility-administered medications for this visit.    Allergies as of 05/28/2021 - Review Complete 05/28/2021  Allergen Reaction Noted   Prednisone  11/17/2011    ROS:  General: Negative for anorexia, weight loss, fever, chills, fatigue, weakness. ENT: Negative for hoarseness, difficulty swallowing , nasal congestion. CV: Negative for chest pain, angina, palpitations, dyspnea on exertion, peripheral edema.  Respiratory: Negative for dyspnea at rest, dyspnea on exertion, cough, sputum, wheezing.  GI: See history of present illness. GU:  Negative for dysuria, hematuria, urinary incontinence, urinary frequency,  nocturnal urination.  Endo: Negative for unusual weight change.    Physical Examination:   BP 121/66 (BP Location: Left Arm, Patient Position: Sitting, Cuff Size: Normal)   Pulse 72   Temp (!) 97.5 F (36.4 C) (Temporal)   Ht 5\' 7"  (1.702 m)   Wt 136 lb 6.4 oz (61.9 kg)   BMI 21.36 kg/m   General: Well-nourished, well-developed in no acute distress.  Eyes: No icterus. Conjunctivae pink. Lungs: Clear to auscultation bilaterally. Non-labored. Heart: Regular rate and rhythm, no murmurs rubs or gallops.  Abdomen: Bowel sounds are normal, nontender, nondistended, no hepatosplenomegaly or masses, no abdominal bruits or hernia , no rebound or guarding.   Extremities: No lower extremity edema. No clubbing or deformities. Neuro: Alert and oriented x 3.  Grossly intact. Skin: Warm and dry, no jaundice.   Psych: Alert and cooperative, normal mood and affect.  Labs:    Imaging Studies: No results found.  Assessment and Plan:   [name] is a 80 y.o. y/o female who comes in today with a history of black stools diarrhea abdominal discomfort and a concern for an upper GI bleed. The patient has been feeling better and states that her symptoms have completely resolved and her weight is slowly increasing.  She still has concern about whether she has any upper GI pathology as the cause of her black stools since she states she has a history of peptic ulcer disease.  The patient will be set up for an upper endoscopy to rule out any pathology such as a neoplasm or ulcer.  The patient has been explained the plan and agrees with it.     [name], MD. Marval Regal    Note: This dictation was prepared with Dragon dictation along with smaller phrase technology. Any transcriptional errors that result from this process are unintentional.

## 2021-05-28 NOTE — Progress Notes (Signed)
Primary Care Physician: [name], MD  Primary Gastroenterologist:  Dr. [name]  Chief Complaint  Patient presents with   Diarrhea    HPI: [name] is a 80 y.o. female here to see me for diarrhea.  The patient had seen me back in December 2020 for constipation with a family history of 2 sisters with colon cancer.  The patient underwent a colonoscopy in 2021 for the family history and the constipation.  At that time 1 polyp was found and was removed.  The patient had presented in May to her primary care provider with black tarry stools and diarrhea.  The diarrhea was reported to have started on 5/22 and at that time the patient also had about an hour of vomiting.  The patient was sent home from her primary care provider's visit with instructed to go to the emergency room if any further bleeding or symptomatic anemia should have occurred. The patient reports that her symptoms have completely come back to normal and she is not having any issues at the present time and is starting to gain her weight back.  The patient comes with her daughter today who reports that the patient has a lot of stress and is concerned about gastric ulcers.  The patient's constipation has returned and she is no longer having diarrhea.  The patient is not sure whether the black stools may have been from something she had eat or whether she may have had an upper GI bleed.  Past Medical History:  Diagnosis Date   Arthritis    knees   Basal cell carcinoma 10/23/2020   right lateral ankle EDC done 12/18/20   Basal cell carcinoma 05/13/2021   left forehead/ right pretibia   History of dysplastic nevus 05/13/2021   right dorsal hand, mild   Hyperlipidemia    Hypothyroidism    PVC's (premature ventricular contractions)    a.  48-hour Holter in 3/17 demonstrated NSR, frequent PVCs totaling 5300 beats representing 3% burden, occasional PACs, short runs of SVT    Current Outpatient Medications   Medication Sig Dispense Refill   aspirin EC 81 MG tablet Take 81 mg by mouth daily.     cholecalciferol (VITAMIN D) 1000 UNITS tablet Take 2,000 Units by mouth daily.      diltiazem (CARDIZEM CD) 120 MG 24 hr capsule Take 1 capsule (120 mg total) by mouth daily. 90 capsule 3   diphenhydramine-acetaminophen (TYLENOL PM) 25-500 MG TABS tablet Take 1 tablet by mouth at bedtime as needed.     fluorouracil (EFUDEX) 5 % cream Apply topically 2 (two) times daily. For 4 days 15 g 0   glucosamine-chondroitin 500-400 MG tablet Take 1 tablet by mouth daily.      levothyroxine (SYNTHROID) 50 MCG tablet TAKE 1 TABLET BY MOUTH  DAILY 90 tablet 3   mometasone (ELOCON) 0.1 % lotion SMARTSIG:2-4 Drop(s) In Ear(s) Every Night PRN     Multiple Vitamins-Minerals (MULTIVITAMIN WITH MINERALS) tablet Take 1 tablet by mouth daily.     Omega-3 Fatty Acids (FISH OIL) 1000 MG CPDR Take by mouth daily.     pantoprazole (PROTONIX) 20 MG tablet TAKE 1 TABLET BY MOUTH EVERY DAY 90 tablet 1   rosuvastatin (CRESTOR) 10 MG tablet TAKE 1 TABLET BY MOUTH  DAILY 90 tablet 3   telmisartan (MICARDIS) 20 MG tablet TAKE 1 TABLET (20 MG TOTAL) BY MOUTH DAILY. IN THE MORNING FOR BLOOD PRESSURE 90 tablet 1   traZODone (DESYREL) 50  MG tablet TAKE 0.5-1 TABLETS BY MOUTH AT BEDTIME AS NEEDED FOR SLEEP. 90 tablet 2   No current facility-administered medications for this visit.    Allergies as of 05/28/2021 - Review Complete 05/28/2021  Allergen Reaction Noted   Prednisone  11/17/2011    ROS:  General: Negative for anorexia, weight loss, fever, chills, fatigue, weakness. ENT: Negative for hoarseness, difficulty swallowing , nasal congestion. CV: Negative for chest pain, angina, palpitations, dyspnea on exertion, peripheral edema.  Respiratory: Negative for dyspnea at rest, dyspnea on exertion, cough, sputum, wheezing.  GI: See history of present illness. GU:  Negative for dysuria, hematuria, urinary incontinence, urinary frequency,  nocturnal urination.  Endo: Negative for unusual weight change.    Physical Examination:   BP 121/66 (BP Location: Left Arm, Patient Position: Sitting, Cuff Size: Normal)   Pulse 72   Temp (!) 97.5 F (36.4 C) (Temporal)   Ht 5\' 7"  (1.702 m)   Wt 136 lb 6.4 oz (61.9 kg)   BMI 21.36 kg/m   General: Well-nourished, well-developed in no acute distress.  Eyes: No icterus. Conjunctivae pink. Lungs: Clear to auscultation bilaterally. Non-labored. Heart: Regular rate and rhythm, no murmurs rubs or gallops.  Abdomen: Bowel sounds are normal, nontender, nondistended, no hepatosplenomegaly or masses, no abdominal bruits or hernia , no rebound or guarding.   Extremities: No lower extremity edema. No clubbing or deformities. Neuro: Alert and oriented x 3.  Grossly intact. Skin: Warm and dry, no jaundice.   Psych: Alert and cooperative, normal mood and affect.  Labs:    Imaging Studies: No results found.  Assessment and Plan:   [name] is a 80 y.o. y/o female who comes in today with a history of black stools diarrhea abdominal discomfort and a concern for an upper GI bleed. The patient has been feeling better and states that her symptoms have completely resolved and her weight is slowly increasing.  She still has concern about whether she has any upper GI pathology as the cause of her black stools since she states she has a history of peptic ulcer disease.  The patient will be set up for an upper endoscopy to rule out any pathology such as a neoplasm or ulcer.  The patient has been explained the plan and agrees with it.     [name], MD. Marval Regal    Note: This dictation was prepared with Dragon dictation along with smaller phrase technology. Any transcriptional errors that result from this process are unintentional.

## 2021-06-15 ENCOUNTER — Encounter: Payer: Self-pay | Admitting: Gastroenterology

## 2021-06-15 ENCOUNTER — Other Ambulatory Visit: Payer: Self-pay

## 2021-06-25 ENCOUNTER — Encounter: Payer: Self-pay | Admitting: Gastroenterology

## 2021-06-25 ENCOUNTER — Other Ambulatory Visit: Payer: Self-pay

## 2021-06-25 ENCOUNTER — Ambulatory Visit
Admission: RE | Admit: 2021-06-25 | Discharge: 2021-06-25 | Disposition: A | Discharge status: Home / Self Care | Payer: Medicare Other | Attending: Gastroenterology | Admitting: Gastroenterology

## 2021-06-25 ENCOUNTER — Encounter
Admission: RE | Disposition: A | Discharge status: Home / Self Care | Payer: Self-pay | Source: Home / Self Care | Attending: Gastroenterology

## 2021-06-25 ENCOUNTER — Ambulatory Visit: Payer: Medicare Other | Admitting: Anesthesiology

## 2021-06-25 DIAGNOSIS — K222 Esophageal obstruction: Secondary | ICD-10-CM | POA: Insufficient documentation

## 2021-06-25 DIAGNOSIS — Z87891 Personal history of nicotine dependence: Secondary | ICD-10-CM | POA: Diagnosis not present

## 2021-06-25 DIAGNOSIS — Z85828 Personal history of other malignant neoplasm of skin: Secondary | ICD-10-CM | POA: Diagnosis not present

## 2021-06-25 DIAGNOSIS — K229 Disease of esophagus, unspecified: Secondary | ICD-10-CM

## 2021-06-25 DIAGNOSIS — Z8711 Personal history of peptic ulcer disease: Secondary | ICD-10-CM | POA: Insufficient documentation

## 2021-06-25 DIAGNOSIS — D130 Benign neoplasm of esophagus: Secondary | ICD-10-CM | POA: Diagnosis not present

## 2021-06-25 DIAGNOSIS — K449 Diaphragmatic hernia without obstruction or gangrene: Secondary | ICD-10-CM | POA: Diagnosis not present

## 2021-06-25 DIAGNOSIS — Z888 Allergy status to other drugs, medicaments and biological substances status: Secondary | ICD-10-CM | POA: Diagnosis not present

## 2021-06-25 DIAGNOSIS — Z7982 Long term (current) use of aspirin: Secondary | ICD-10-CM | POA: Insufficient documentation

## 2021-06-25 DIAGNOSIS — Z79899 Other long term (current) drug therapy: Secondary | ICD-10-CM | POA: Diagnosis not present

## 2021-06-25 DIAGNOSIS — K2990 Gastroduodenitis, unspecified, without bleeding: Secondary | ICD-10-CM | POA: Diagnosis not present

## 2021-06-25 DIAGNOSIS — K2289 Other specified disease of esophagus: Secondary | ICD-10-CM | POA: Diagnosis not present

## 2021-06-25 DIAGNOSIS — Z800 Family history of malignant neoplasm of digestive organs: Secondary | ICD-10-CM | POA: Diagnosis not present

## 2021-06-25 DIAGNOSIS — B3781 Candidal esophagitis: Secondary | ICD-10-CM | POA: Diagnosis not present

## 2021-06-25 DIAGNOSIS — K921 Melena: Secondary | ICD-10-CM | POA: Diagnosis not present

## 2021-06-25 HISTORY — PX: ESOPHAGOGASTRODUODENOSCOPY (EGD) WITH PROPOFOL: SHX5813

## 2021-06-25 SURGERY — ESOPHAGOGASTRODUODENOSCOPY (EGD) WITH PROPOFOL
Anesthesia: General | Site: Throat

## 2021-06-25 MED ORDER — ACETAMINOPHEN 160 MG/5ML PO SOLN: 325.0000 mg | ORAL | Status: DC | PRN

## 2021-06-25 MED ORDER — GLYCOPYRROLATE 0.2 MG/ML IJ SOLN
INTRAMUSCULAR | Status: DC | PRN
  Administered 2021-06-25: .1 mg via INTRAVENOUS

## 2021-06-25 MED ORDER — ACETAMINOPHEN 325 MG PO TABS: 325.0000 mg | ORAL_TABLET | ORAL | Status: DC | PRN

## 2021-06-25 MED ORDER — PROPOFOL 10 MG/ML IV BOLUS
INTRAVENOUS | Status: DC | PRN
  Administered 2021-06-25 (×3): 20 mg via INTRAVENOUS
  Administered 2021-06-25: 70 mg via INTRAVENOUS

## 2021-06-25 MED ORDER — ONDANSETRON HCL 4 MG/2ML IJ SOLN: 4.0000 mg | Freq: Once | INTRAMUSCULAR | Status: DC | PRN

## 2021-06-25 MED ORDER — LACTATED RINGERS IV SOLN: INTRAVENOUS | Status: DC

## 2021-06-25 MED ORDER — LIDOCAINE HCL (CARDIAC) PF 100 MG/5ML IV SOSY
PREFILLED_SYRINGE | INTRAVENOUS | Status: DC | PRN
  Administered 2021-06-25: 50 mg via INTRAVENOUS

## 2021-06-25 SURGICAL SUPPLY — 9 items
BLOCK BITE 60FR ADLT L/F GRN (MISCELLANEOUS) ×2 IMPLANT
BRUSH CYTO GASTROSCOPE 3.0 (MISCELLANEOUS) ×2 IMPLANT
FORCEPS BIOP RAD 4 LRG CAP 4 (CUTTING FORCEPS) ×2 IMPLANT
GOWN CVR UNV OPN BCK APRN NK (MISCELLANEOUS) ×2 IMPLANT
GOWN ISOL THUMB LOOP REG UNIV (MISCELLANEOUS) ×4
KIT PRC NS LF DISP ENDO (KITS) ×1 IMPLANT
KIT PROCEDURE OLYMPUS (KITS) ×2
MANIFOLD NEPTUNE II (INSTRUMENTS) ×2 IMPLANT
WATER STERILE IRR 250ML POUR (IV SOLUTION) ×2 IMPLANT

## 2021-06-25 NOTE — Interval H&P Note (Signed)
[name], MD [facility] [address] [place], [place] [address] Phone:[phone] Fax : [phone]  Primary Care Physician:  [name], MD Primary Gastroenterologist:  Dr. Allen Norris  Pre-Procedure History & Physical: HPI:  [name] is a 80 y.o. female is here for an endoscopy.   Past Medical History:  Diagnosis Date   Arthritis    knees   Basal cell carcinoma 10/23/2020   right lateral ankle EDC done 12/18/20   Basal cell carcinoma 05/13/2021   left forehead/ right pretibia   History of dysplastic nevus 05/13/2021   right dorsal hand, mild   Hyperlipidemia    Hypothyroidism    PVC's (premature ventricular contractions)    a.  48-hour Holter in 3/17 demonstrated NSR, frequent PVCs totaling 5300 beats representing 3% burden, occasional PACs, short runs of SVT    Past Surgical History:  Procedure Laterality Date   ABDOMINAL HYSTERECTOMY     In 30's   BROW LIFT Bilateral 07/12/2017   Procedure: BLEPHAROPLASTY upper eyelid with excess skin;  Surgeon: Karle Starch, MD;  Location: [place];  Service: Ophthalmology;  Laterality: Bilateral;  MAC   COLONOSCOPY WITH PROPOFOL N/A 12/29/2015   Procedure: COLONOSCOPY WITH PROPOFOL;  Surgeon: Hulen Luster, MD;  Location: [facility] ENDOSCOPY;  Service: Gastroenterology;  Laterality: N/A;   COLONOSCOPY WITH PROPOFOL N/A 12/03/2019   Procedure: COLONOSCOPY WITH BIOPSY;  Surgeon: [name], MD;  Location: [place];  Service: Endoscopy;  Laterality: N/A;   POLYPECTOMY N/A 12/03/2019   Procedure: POLYPECTOMY;  Surgeon: [name], MD;  Location: [place];  Service: Endoscopy;  Laterality: N/A;   PTOSIS REPAIR Bilateral 07/12/2017   Procedure: ptosis repair resect ex;  Surgeon: Karle Starch, MD;  Location: [place];  Service: Ophthalmology;  Laterality: Bilateral;  Requests early   VAGINAL PROLAPSE REPAIR  2014   Dr. Enzo Bi    Prior to Admission medications   Medication Sig  Start Date End Date Taking? Authorizing Provider  cholecalciferol (VITAMIN D) 1000 UNITS tablet Take 2,000 Units by mouth daily.    Yes [provider]  diltiazem (CARDIZEM CD) 120 MG 24 hr capsule Take 1 capsule (120 mg total) by mouth daily. 12/22/20  Yes Loel Dubonnet, NP  diphenhydramine-acetaminophen (TYLENOL PM) 25-500 MG TABS tablet Take 1 tablet by mouth at bedtime as needed.   Yes [provider]  fluorouracil (EFUDEX) 5 % cream Apply topically 2 (two) times daily. For 4 days 05/13/21  Yes Moye, [place], MD  glucosamine-chondroitin 500-400 MG tablet Take 1 tablet by mouth daily.    Yes [provider]  levothyroxine (SYNTHROID) 50 MCG tablet TAKE 1 TABLET BY MOUTH  DAILY 05/18/21  Yes [name], MD  mometasone (ELOCON) 0.1 % lotion SMARTSIG:2-4 Drop(s) In Ear(s) Every Night PRN 01/26/21  Yes [provider]  Multiple Vitamins-Minerals (MULTIVITAMIN WITH MINERALS) tablet Take 1 tablet by mouth daily.   Yes [provider]  Omega-3 Fatty Acids (FISH OIL) 1000 MG CPDR Take by mouth daily.   Yes [provider]  pantoprazole (PROTONIX) 20 MG tablet TAKE 1 TABLET BY MOUTH EVERY DAY 03/20/21  Yes Loel Dubonnet, NP  rosuvastatin (CRESTOR) 10 MG tablet TAKE 1 TABLET BY MOUTH  DAILY 05/18/21  Yes [name], MD  telmisartan (MICARDIS) 20 MG tablet TAKE 1 TABLET (20 MG TOTAL) BY MOUTH DAILY. IN THE MORNING FOR BLOOD PRESSURE 05/21/21  Yes [name], MD  traZODone (DESYREL) 50  MG tablet TAKE 0.5-1 TABLETS BY MOUTH AT BEDTIME AS NEEDED FOR SLEEP. 10/20/20  Yes [name], MD  aspirin EC 81 MG tablet Take 81 mg by mouth daily. Patient not taking: Reported on 06/15/2021    [provider]    Allergies as of 05/28/2021 - Review Complete 05/28/2021  Allergen Reaction Noted   Prednisone  11/17/2011    Family History  Problem Relation Age of Onset   Breast cancer Mother        50's   Heart attack Mother     Diabetes Mother    Stroke Father    Diabetes Father    Pancreatic cancer Sister    Cancer Sister        pancreatic   Cancer Brother        liver   Diabetes Brother    Cancer Sister        anal   Breast cancer Sister    Heart attack Brother    Diabetes Brother    Cancer Brother        stomach   Cancer Brother    Diabetes Brother    Colon cancer Neg Hx     Social History   Socioeconomic History   Marital status: Married    Spouse name: Not on file   Number of children: 2   Years of education: Not on file   Highest education level: Not on file  Occupational History   Occupation: Retired Education officer, museum  Tobacco Use   Smoking status: Former    Years: 1.00    Types: Cigarettes   Smokeless tobacco: Never   Tobacco comments:    Smoked x 1 year in her 20's  Vaping Use   Vaping Use: Never used  Substance and Sexual Activity   Alcohol use: No   Drug use: No   Sexual activity: Never  Other Topics Concern   Not on file  Social History Narrative   Daily Caffeine Use:  One cup coffee in am    Regular Exercise -  5 days a week x 1 hour - gym, aerobic and wt training         Social Determinants of Health   Financial Resource Strain: Low Risk    Difficulty of Paying Living Expenses: Not hard at all  Food Insecurity: No Food Insecurity   Worried About Charity fundraiser in the Last Year: Never true   [place] in the Last Year: Never true  Transportation Needs: No Transportation Needs   Lack of Transportation (Medical): No   Lack of Transportation (Non-Medical): No  Physical Activity: Sufficiently Active   Days of Exercise per Week: 5 days   Minutes of Exercise per Session: 60 min  Stress: No Stress Concern Present   Feeling of Stress : Not at all  Social Connections: Unknown   Frequency of Communication with Friends and Family: More than three times a week   Frequency of Social Gatherings with Friends and Family: Three times a week   Attends Religious  Services: Not on file   Active Member of Clubs or Organizations: Not on file   Attends Archivist Meetings: Not on file   Marital Status: Married  Intimate Partner Violence: Not At Risk   Fear of Current or Ex-Partner: No   Emotionally Abused: No   Physically Abused: No   Sexually Abused: No    Review of Systems: See HPI, otherwise negative ROS  Physical Exam: BP Marland Kitchen)  143/61   Pulse 66   Temp (!) 97.5 F (36.4 C) (Temporal)   Resp 18   Ht _0  (1.676 m)   Wt 59.9 kg   SpO2 97%   BMI 21.31 kg/m  General:   Alert,  pleasant and cooperative in NAD Head:  Normocephalic and atraumatic. Neck:  Supple; no masses or thyromegaly. Lungs:  Clear throughout to auscultation.    Heart:  Regular rate and rhythm. Abdomen:  Soft, nontender and nondistended. Normal bowel sounds, without guarding, and without rebound.   Neurologic:  Alert and  oriented x4;  grossly normal neurologically.  Impression/Plan: MYLEEN BRAILSFORD is here for an endoscopy to be performed for melena  Risks, benefits, limitations, and alternatives regarding  endoscopy have been reviewed with the patient.  Questions have been answered.  All parties agreeable.   [name], MD  06/25/2021, 9:05 AM

## 2021-06-25 NOTE — Op Note (Signed)
[facility] Patient Name: [name] Procedure Date: 06/25/2021 9:30 AM MRN: [id number] Account #: [id number] Date of Birth: 1941-01-20 Admit Type: Outpatient Age: 80 Room: [facility] OR ROOM 01 Gender: Female Note Status: Finalized Procedure:             Upper GI endoscopy Indications:           Melena Providers:             [name] MD, MD Referring MD:          Deborra Medina, MD (Referring MD) Medicines:             Propofol per Anesthesia Complications:         No immediate complications. Procedure:             Pre-Anesthesia Assessment:                        - Prior to the procedure, a History and Physical was                         performed, and patient medications and allergies were                         reviewed. The patient's tolerance of previous                         anesthesia was also reviewed. The risks and benefits                         of the procedure and the sedation options and risks                         were discussed with the patient. All questions were                         answered, and informed consent was obtained. Prior                         Anticoagulants: The patient has taken no previous                         anticoagulant or antiplatelet agents. ASA Grade                         Assessment: II - A patient with mild systemic disease.                         After reviewing the risks and benefits, the patient                         was deemed in satisfactory condition to undergo the                         procedure.                        After obtaining informed consent, the endoscope was  passed under direct vision. Throughout the procedure,                         the patient's blood pressure, pulse, and oxygen                         saturations were monitored continuously. The was                         introduced through the mouth, and advanced to the                          second part of duodenum. The upper GI endoscopy was                         accomplished without difficulty. The patient tolerated                         the procedure well. Findings:      A small hiatal hernia was present.      Patchy, white plaques were found in the middle third of the esophagus.       Cells for cytology were obtained by brushing.      One benign-appearing, intrinsic mild stenosis was found at the       gastroesophageal junction. The stenosis was traversed.      One 4 mm polyp was found 20 cm from the incisors. Biopsies were taken       with a cold forceps for histology.      The stomach was normal.      The examined duodenum was normal. Impression:            - Small hiatal hernia.                        - Esophageal plaques were found, suspicious for                         candidiasis. Cells for cytology obtained.                        - Benign-appearing esophageal stenosis.                        - Esophageal polyp(s) were found. Biopsied.                        - Normal stomach.                        - Normal examined duodenum. Recommendation:        - Discharge patient to home.                        - Resume previous diet.                        - Continue present medications.                        - Await pathology results. Procedure Code(s):     --- Professional ---  [id number], Esophagogastroduodenoscopy, flexible,                         transoral; with biopsy, single or multiple Diagnosis Code(s):     --- Professional ---                        K92.1, Melena (includes Hematochezia)                        K22.9, Disease of esophagus, unspecified                        K22.8, Other specified diseases of esophagus CPT copyright 2019 American Medical Association. All rights reserved. The codes documented in this report are preliminary and upon coder review may  be revised to meet current compliance requirements. [name] MD,  MD 06/25/2021 9:48:56 AM This report has been signed electronically. Number of Addenda: 0 Note Initiated On: 06/25/2021 9:30 AM Estimated Blood Loss:  Estimated blood loss: none.      [facility]

## 2021-06-25 NOTE — Anesthesia Preprocedure Evaluation (Addendum)
Anesthesia Evaluation  Patient identified by MRN, date of birth, ID band Patient awake    Reviewed: Allergy & Precautions, H&P , NPO status   Airway Mallampati: II  TM Distance: >3 FB Neck ROM: full    Dental no notable dental hx.    Pulmonary former smoker,    Pulmonary exam normal breath sounds clear to auscultation       Cardiovascular Exercise Tolerance: Good + dysrhythmias Supra Ventricular Tachycardia  Rhythm:regular Rate:Normal  PVCs   Neuro/Psych    GI/Hepatic   Endo/Other  Hypothyroidism   Renal/GU      Musculoskeletal  (+) Arthritis ,   Abdominal   Peds  Hematology   Anesthesia Other Findings   Reproductive/Obstetrics                             Anesthesia Physical  Anesthesia Plan  ASA: 3  Anesthesia Plan: General   Post-op Pain Management:    Induction:   PONV Risk Score and Plan: 3 and Propofol infusion, TIVA and Treatment may vary due to age or medical condition  Airway Management Planned:   Additional Equipment:   Intra-op Plan:   Post-operative Plan:   Informed Consent: I have reviewed the patients History and Physical, chart, labs and discussed the procedure including the risks, benefits and alternatives for the proposed anesthesia with the patient or authorized representative who has indicated his/her understanding and acceptance.       Plan Discussed with: CRNA  Anesthesia Plan Comments:        Anesthesia Quick Evaluation

## 2021-06-25 NOTE — Anesthesia Procedure Notes (Signed)
Date/Time: 06/25/2021 9:37 AM Performed by: Mayme Genta, CRNA Pre-anesthesia Checklist: Patient identified, Emergency Drugs available, Suction available, Timeout performed and Patient being monitored Patient Re-evaluated:Patient Re-evaluated prior to induction Oxygen Delivery Method: Nasal cannula Placement Confirmation: positive ETCO2

## 2021-06-25 NOTE — Anesthesia Postprocedure Evaluation (Signed)
Anesthesia Post Note  Patient: [name]  Procedure(s) Performed: ESOPHAGOGASTRODUODENOSCOPY (EGD) WITH PROPOFOL (Throat)     Patient location during evaluation: PACU Anesthesia Type: General Level of consciousness: awake Pain management: pain level controlled Vital Signs Assessment: post-procedure vital signs reviewed and stable Respiratory status: respiratory function stable Cardiovascular status: stable Postop Assessment: no signs of nausea or vomiting Anesthetic complications: no   No notable events documented.  Veda Canning

## 2021-06-25 NOTE — Transfer of Care (Signed)
Immediate Anesthesia Transfer of Care Note  Patient: [name]  Procedure(s) Performed: ESOPHAGOGASTRODUODENOSCOPY (EGD) WITH PROPOFOL (Throat)  Patient Location: PACU  Anesthesia Type: General  Level of Consciousness: awake, alert  and patient cooperative  Airway and Oxygen Therapy: Patient Spontanous Breathing and Patient connected to supplemental oxygen  Post-op Assessment: Post-op Vital signs reviewed, Patient's Cardiovascular Status Stable, Respiratory Function Stable, Patent Airway and No signs of Nausea or vomiting  Post-op Vital Signs: Reviewed and stable  Complications: No notable events documented.

## 2021-06-29 LAB — CYTOLOGY - NON PAP

## 2021-06-29 LAB — SURGICAL PATHOLOGY

## 2021-06-30 ENCOUNTER — Telehealth: Payer: Self-pay

## 2021-06-30 ENCOUNTER — Other Ambulatory Visit: Payer: Self-pay

## 2021-06-30 DIAGNOSIS — B3781 Candidal esophagitis: Secondary | ICD-10-CM

## 2021-06-30 HISTORY — DX: Candidal esophagitis: B37.81

## 2021-06-30 MED ORDER — FLUCONAZOLE 100 MG PO TABS: 100.0000 mg | ORAL_TABLET | Freq: Every day | ORAL | 0 refills | Status: DC

## 2021-06-30 NOTE — Telephone Encounter (Signed)
Pt notified of EGD results. Diflucan has been sent to pt's pharmacy.

## 2021-06-30 NOTE — Telephone Encounter (Signed)
-----   Message from [name], MD sent at 06/29/2021  6:56 PM EDT ----- The patient needs to be informed that the brushings from the esophagus showed a yeast infection. Please treat the patient with Diflucan. Thank you.

## 2021-07-22 ENCOUNTER — Ambulatory Visit: Payer: Medicare Other | Admitting: Dermatology

## 2021-07-22 ENCOUNTER — Other Ambulatory Visit: Payer: Self-pay

## 2021-07-22 ENCOUNTER — Encounter: Payer: Self-pay | Admitting: Dermatology

## 2021-07-22 DIAGNOSIS — C44712 Basal cell carcinoma of skin of right lower limb, including hip: Secondary | ICD-10-CM

## 2021-07-22 NOTE — Progress Notes (Signed)
   Follow-Up Visit   Subjective  [name] is a 80 y.o. female who presents for the following: Procedure (Patient here today for [facility] to right pretibia for bx proven BCC. ).  Patient scheduled for Mohs to treat bx proven BCC at left forehead next week.  The following portions of the chart were reviewed this encounter and updated as appropriate:   Tobacco  Allergies  Meds  Problems  Med Hx  Surg Hx  Fam Hx      Review of Systems:  No other skin or systemic complaints except as noted in HPI or Assessment and Plan.  Objective  Well appearing patient in no apparent distress; mood and affect are within normal limits.  A focused examination was performed including face, legs. Relevant physical exam findings are noted in the Assessment and Plan.  right pretibia Healed biopsy site   Assessment & Plan  Basal cell carcinoma (BCC) of skin of right lower extremity including hip right pretibia  Destruction of lesion  Destruction method: electrodesiccation and curettage   Informed consent: discussed and consent obtained   Timeout:  patient name, date of birth, surgical site, and procedure verified Patient was prepped and draped in usual sterile fashion: area prepped with isopropyl alcohol. Anesthesia: the lesion was anesthetized in a standard fashion   Anesthetic:  1% lidocaine w/ epinephrine 1-100,000 buffered w/ 8.4% NaHCO3 Curettage performed in three different directions: Yes   Electrodesiccation performed over the curetted area: Yes   Curettage cycles:  3 Final wound size (cm):  2.1 Hemostasis achieved with:  electrodesiccation Outcome: patient tolerated procedure well with no complications   Post-procedure details: wound care instructions given   Additional details:  Mupirocin and a pressure dressing applied  Return for as scheduled.  Graciella Belton, RMA, am acting as scribe for Forest Gleason, MD .  Documentation: I have reviewed the above documentation for  accuracy and completeness, and I agree with the above.  Forest Gleason, MD

## 2021-07-22 NOTE — Patient Instructions (Addendum)
Wound Care Instructions  Cleanse wound gently with soap and water once a day then pat dry with clean gauze. Apply a thing coat of Petrolatum (petroleum jelly, "Vaseline") over the wound (unless you have an allergy to this). We recommend that you use a new, sterile tube of Vaseline. Do not pick or remove scabs. Do not remove the yellow or white "healing tissue" from the base of the wound.  Cover the wound with fresh, clean, nonstick gauze and secure with paper tape. You may use Band-Aids in place of gauze and tape if the would is small enough, but would recommend trimming much of the tape off as there is often too much. Sometimes Band-Aids can irritate the skin.  You should call the office for your biopsy report after 1 week if you have not already been contacted.  If you experience any problems, such as abnormal amounts of bleeding, swelling, significant bruising, significant pain, or evidence of infection, please call the office immediately.  FOR ADULT SURGERY PATIENTS: If you need something for pain relief you may take 1 extra strength Tylenol (acetaminophen) AND 2 Ibuprofen (200mg each) together every 4 hours as needed for pain. (do not take these if you are allergic to them or if you have a reason you should not take them.) Typically, you may only need pain medication for 1 to 3 days.   If you have any questions or concerns for your doctor, please call our main line at [phone] and press option 4 to reach your doctor's medical assistant. If no one answers, please leave a voicemail as directed and we will return your call as soon as possible. Messages left after 4 pm will be answered the following business day.   You may also send us a message via MyChart. We typically respond to MyChart messages within 1-2 business days.  For prescription refills, please ask your pharmacy to contact our office. Our fax number is [phone].  If you have an urgent issue when the clinic is closed that  cannot wait until the next business day, you can page your doctor at the number below.    Please note that while we do our best to be available for urgent issues outside of office hours, we are not available 24/7.   If you have an urgent issue and are unable to reach us, you may choose to seek medical care at your doctor's office, retail clinic, urgent care center, or emergency room.  If you have a medical emergency, please immediately call 911 or go to the emergency department.  Pager Numbers  - Dr. Kowalski: [phone]  - Dr. Moye: [phone]  - Dr. Stewart: [phone]  In the event of inclement weather, please call our main line at [phone] for an update on the status of any delays or closures.  Dermatology Medication Tips: Please keep the boxes that topical medications come in in order to help keep track of the instructions about where and how to use these. Pharmacies typically print the medication instructions only on the boxes and not directly on the medication tubes.   If your medication is too expensive, please contact our office at [phone] option 4 or send us a message through MyChart.   We are unable to tell what your co-pay for medications will be in advance as this is different depending on your insurance coverage. However, we may be able to find a substitute medication at lower cost or fill out paperwork to get insurance to cover a needed   medication.   If a prior authorization is required to get your medication covered by your insurance company, please allow us 1-2 business days to complete this process.  Drug prices often vary depending on where the prescription is filled and some pharmacies may offer cheaper prices.  The website www.goodrx.com contains coupons for medications through different pharmacies. The prices here do not account for what the cost may be with help from insurance (it may be cheaper with your insurance), but the website can give you the  price if you did not use any insurance.  - You can print the associated coupon and take it with your prescription to the pharmacy.  - You may also stop by our office during regular business hours and pick up a GoodRx coupon card.  - If you need your prescription sent electronically to a different pharmacy, notify our office through [facility] MyChart or by phone at [phone] option 4.   

## 2021-07-28 ENCOUNTER — Other Ambulatory Visit: Payer: Self-pay

## 2021-07-29 DIAGNOSIS — C44319 Basal cell carcinoma of skin of other parts of face: Secondary | ICD-10-CM | POA: Diagnosis not present

## 2021-08-13 ENCOUNTER — Ambulatory Visit (INDEPENDENT_AMBULATORY_CARE_PROVIDER_SITE_OTHER): Payer: Medicare Other

## 2021-08-13 VITALS — Ht 66.0 in | Wt 132.0 lb

## 2021-08-13 DIAGNOSIS — Z0000 Encounter for general adult medical examination without abnormal findings: Secondary | ICD-10-CM

## 2021-08-13 NOTE — Patient Instructions (Addendum)
[name] , Thank you for taking time to come for your Medicare Wellness Visit. I appreciate your ongoing commitment to your health goals. Please review the following plan we discussed and let me know if I can assist you in the future.   These are the goals we discussed:  Goals      Follow up with pcp as needed     Low carb diet        This is a list of the screening recommended for you and due dates:  Health Maintenance  Topic Date Due   COVID-19 Vaccine (4 - Booster for Pfizer series) 08/29/2021*   Flu Shot  02/19/2022*   Mammogram  04/02/2022   Tetanus Vaccine  09/29/2026   DEXA scan (bone density measurement)  Completed   Zoster (Shingles) Vaccine  Completed   HPV Vaccine  Aged Out  *Topic was postponed. The date shown is not the original due date.   Advanced directives: End of life planning; Advance aging; Advanced directives discussed.  Copy of current HCPOA/Living Will requested.    Conditions/risks identified: none new  Follow up in one year for your annual wellness visit    Preventive Care 65 Years and Older, Female Preventive care refers to lifestyle choices and visits with your health care provider that can promote health and wellness. What does preventive care include? A yearly physical exam. This is also called an annual well check. Dental exams once or twice a year. Routine eye exams. Ask your health care provider how often you should have your eyes checked. Personal lifestyle choices, including: Daily care of your teeth and gums. Regular physical activity. Eating a healthy diet. Avoiding tobacco and drug use. Limiting alcohol use. Practicing safe sex. Taking low-dose aspirin every day. Taking vitamin and mineral supplements as recommended by your health care provider. What happens during an annual well check? The services and screenings done by your health care provider during your annual well check will depend on your age, overall health, lifestyle  risk factors, and family history of disease. Counseling  Your health care provider may ask you questions about your: Alcohol use. Tobacco use. Drug use. Emotional well-being. Home and relationship well-being. Sexual activity. Eating habits. History of falls. Memory and ability to understand (cognition). Work and work Statistician. Reproductive health. Screening  You may have the following tests or measurements: Height, weight, and BMI. Blood pressure. Lipid and cholesterol levels. These may be checked every 5 years, or more frequently if you are over 51 years old. Skin check. Lung cancer screening. You may have this screening every year starting at age 74 if you have a 30-pack-year history of smoking and currently smoke or have quit within the past 15 years. Fecal occult blood test (FOBT) of the stool. You may have this test every year starting at age 34. Flexible sigmoidoscopy or colonoscopy. You may have a sigmoidoscopy every 5 years or a colonoscopy every 10 years starting at age 47. Hepatitis C blood test. Hepatitis B blood test. Sexually transmitted disease (STD) testing. Diabetes screening. This is done by checking your blood sugar (glucose) after you have not eaten for a while (fasting). You may have this done every 1-3 years. Bone density scan. This is done to screen for osteoporosis. You may have this done starting at age 38. Mammogram. This may be done every 1-2 years. Talk to your health care provider about how often you should have regular mammograms. Talk with your health care provider about your test  results, treatment options, and if necessary, the need for more tests. Vaccines  Your health care provider may recommend certain vaccines, such as: Influenza vaccine. This is recommended every year. Tetanus, diphtheria, and acellular pertussis (Tdap, Td) vaccine. You may need a Td booster every 10 years. Zoster vaccine. You may need this after age 51. Pneumococcal  13-valent conjugate (PCV13) vaccine. One dose is recommended after age 65. Pneumococcal polysaccharide (PPSV23) vaccine. One dose is recommended after age 24. Talk to your health care provider about which screenings and vaccines you need and how often you need them. This information is not intended to replace advice given to you by your health care provider. Make sure you discuss any questions you have with your health care provider. Document Released: 12/05/2015 Document Revised: 07/28/2016 Document Reviewed: 09/09/2015 Elsevier Interactive Patient Education  2017 [place] Prevention in the Home Falls can cause injuries. They can happen to people of all ages. There are many things you can do to make your home safe and to help prevent falls. What can I do on the outside of my home? Regularly fix the edges of walkways and driveways and fix any cracks. Remove anything that might make you trip as you walk through a door, such as a raised step or threshold. Trim any bushes or trees on the path to your home. Use bright outdoor lighting. Clear any walking paths of anything that might make someone trip, such as rocks or tools. Regularly check to see if handrails are loose or broken. Make sure that both sides of any steps have handrails. Any raised decks and porches should have guardrails on the edges. Have any leaves, snow, or ice cleared regularly. Use sand or salt on walking paths during winter. Clean up any spills in your garage right away. This includes oil or grease spills. What can I do in the bathroom? Use night lights. Install grab bars by the toilet and in the tub and shower. Do not use towel bars as grab bars. Use non-skid mats or decals in the tub or shower. If you need to sit down in the shower, use a plastic, non-slip stool. Keep the floor dry. Clean up any water that spills on the floor as soon as it happens. Remove soap buildup in the tub or shower regularly. Attach  bath mats securely with double-sided non-slip rug tape. Do not have throw rugs and other things on the floor that can make you trip. What can I do in the bedroom? Use night lights. Make sure that you have a light by your bed that is easy to reach. Do not use any sheets or blankets that are too big for your bed. They should not hang down onto the floor. Have a firm chair that has side arms. You can use this for support while you get dressed. Do not have throw rugs and other things on the floor that can make you trip. What can I do in the kitchen? Clean up any spills right away. Avoid walking on wet floors. Keep items that you use a lot in easy-to-reach places. If you need to reach something above you, use a strong step stool that has a grab bar. Keep electrical cords out of the way. Do not use floor polish or wax that makes floors slippery. If you must use wax, use non-skid floor wax. Do not have throw rugs and other things on the floor that can make you trip. What can I do with my stairs?  Do not leave any items on the stairs. Make sure that there are handrails on both sides of the stairs and use them. Fix handrails that are broken or loose. Make sure that handrails are as long as the stairways. Check any carpeting to make sure that it is firmly attached to the stairs. Fix any carpet that is loose or worn. Avoid having throw rugs at the top or bottom of the stairs. If you do have throw rugs, attach them to the floor with carpet tape. Make sure that you have a light switch at the top of the stairs and the bottom of the stairs. If you do not have them, ask someone to add them for you. What else can I do to help prevent falls? Wear shoes that: Do not have high heels. Have rubber bottoms. Are comfortable and fit you well. Are closed at the toe. Do not wear sandals. If you use a stepladder: Make sure that it is fully opened. Do not climb a closed stepladder. Make sure that both sides of the  stepladder are locked into place. Ask someone to hold it for you, if possible. Clearly mark and make sure that you can see: Any grab bars or handrails. First and last steps. Where the edge of each step is. Use tools that help you move around (mobility aids) if they are needed. These include: Canes. Walkers. Scooters. Crutches. Turn on the lights when you go into a dark area. Replace any light bulbs as soon as they burn out. Set up your furniture so you have a clear path. Avoid moving your furniture around. If any of your floors are uneven, fix them. If there are any pets around you, be aware of where they are. Review your medicines with your doctor. Some medicines can make you feel dizzy. This can increase your chance of falling. Ask your doctor what other things that you can do to help prevent falls. This information is not intended to replace advice given to you by your health care provider. Make sure you discuss any questions you have with your health care provider. Document Released: 09/04/2009 Document Revised: 04/15/2016 Document Reviewed: 12/13/2014 Elsevier Interactive Patient Education  2017 [employer].

## 2021-08-13 NOTE — Progress Notes (Addendum)
Subjective:   [name] is a 80 y.o. female who presents for Medicare Annual (Subsequent) preventive examination.  Review of Systems    No ROS.  Medicare Wellness Virtual Visit.  Visual/audio telehealth visit, UTA vital signs.   See social history for additional risk factors.   Cardiac Risk Factors include: advanced age (>82mn, >>80women);hypertension     Objective:    Today's Vitals   08/13/21 1238  Weight: 132 lb (59.9 kg)  Height: _0  (1.676 m)   Body mass index is 21.31 kg/m.  Advanced Directives 08/13/2021 06/25/2021 08/12/2020 12/03/2019 08/10/2019 07/31/2018 07/30/2018  Does Patient Have a Medical Advance Directive? Yes Yes _1   Type of AParamedicof ABuhlLiving will HL[place]Living will - - - - -  Does patient want to make changes to medical advance directive? No - Patient declined No - Patient declined - No - Patient declined No - Patient declined Yes (MAU/Ambulatory/Procedural Areas - Information given) -  [place] of [place]? No - copy requested Yes - validated most recent copy scanned in chart (See row information) - - - - -  Would patient like information on creating a medical advance directive? - - No - Patient declined - - - -    Current Medications (verified) Outpatient Encounter Medications as of 08/13/2021  Medication Sig   aspirin EC 81 MG tablet Take 81 mg by mouth daily. (Patient not taking: Reported on 06/15/2021)   cholecalciferol (VITAMIN D) 1000 UNITS tablet Take 2,000 Units by mouth daily.    diltiazem (CARDIZEM CD) 120 MG 24 hr capsule Take 1 capsule (120 mg total) by mouth daily.   diphenhydramine-acetaminophen (TYLENOL PM) 25-500 MG TABS tablet Take 1 tablet by mouth at bedtime as needed.   fluconazole (DIFLUCAN) 100 MG tablet Take 1 tablet (100 mg total) by mouth daily.   fluorouracil (EFUDEX) 5 % cream Apply topically 2 (two) times daily. For 4 days    glucosamine-chondroitin 500-400 MG tablet Take 1 tablet by mouth daily.    levothyroxine (SYNTHROID) 50 MCG tablet TAKE 1 TABLET BY MOUTH  DAILY   mometasone (ELOCON) 0.1 % lotion SMARTSIG:2-4 Drop(s) In Ear(s) Every Night PRN   Multiple Vitamins-Minerals (MULTIVITAMIN WITH MINERALS) tablet Take 1 tablet by mouth daily.   Omega-3 Fatty Acids (FISH OIL) 1000 MG CPDR Take by mouth daily.   pantoprazole (PROTONIX) 20 MG tablet TAKE 1 TABLET BY MOUTH EVERY DAY   rosuvastatin (CRESTOR) 10 MG tablet TAKE 1 TABLET BY MOUTH  DAILY   telmisartan (MICARDIS) 20 MG tablet TAKE 1 TABLET (20 MG TOTAL) BY MOUTH DAILY. IN THE MORNING FOR BLOOD PRESSURE   traZODone (DESYREL) 50 MG tablet TAKE 0.5-1 TABLETS BY MOUTH AT BEDTIME AS NEEDED FOR SLEEP.   No facility-administered encounter medications on file as of 08/13/2021.    Allergies (verified) Prednisone   History: Past Medical History:  Diagnosis Date   Arthritis    knees   Basal cell carcinoma 10/23/2020   right lateral ankle EDC done 12/18/20   Basal cell carcinoma 05/13/2021   [facility]   Basal cell carcinoma (BCC) 05/13/2021   left forehead, Moh's   History of dysplastic nevus 05/13/2021   right dorsal hand, mild   Hyperlipidemia    Hypothyroidism    PVC's (premature ventricular contractions)    a.  48-hour Holter in 3/17 demonstrated NSR, frequent PVCs totaling 5300 beats representing 3% burden, occasional  PACs, short runs of SVT   Past Surgical History:  Procedure Laterality Date   ABDOMINAL HYSTERECTOMY     In 51's   BROW LIFT Bilateral 07/12/2017   Procedure: BLEPHAROPLASTY upper eyelid with excess skin;  Surgeon: Karle Starch, MD;  Location: [place];  Service: Ophthalmology;  Laterality: Bilateral;  MAC   COLONOSCOPY WITH PROPOFOL N/A 12/29/2015   Procedure: COLONOSCOPY WITH PROPOFOL;  Surgeon: Hulen Luster, MD;  Location: [facility] ENDOSCOPY;  Service: Gastroenterology;  Laterality: N/A;   COLONOSCOPY WITH  PROPOFOL N/A 12/03/2019   Procedure: COLONOSCOPY WITH BIOPSY;  Surgeon: [name], MD;  Location: [place];  Service: Endoscopy;  Laterality: N/A;   ESOPHAGOGASTRODUODENOSCOPY (EGD) WITH PROPOFOL N/A 06/25/2021   Procedure: ESOPHAGOGASTRODUODENOSCOPY (EGD) WITH PROPOFOL;  Surgeon: [name], MD;  Location: [place];  Service: Endoscopy;  Laterality: N/A;   POLYPECTOMY N/A 12/03/2019   Procedure: POLYPECTOMY;  Surgeon: [name], MD;  Location: [place];  Service: Endoscopy;  Laterality: N/A;   PTOSIS REPAIR Bilateral 07/12/2017   Procedure: ptosis repair resect ex;  Surgeon: Karle Starch, MD;  Location: [place];  Service: Ophthalmology;  Laterality: Bilateral;  Requests early   VAGINAL PROLAPSE REPAIR  2014   Dr. Enzo Bi   Family History  Problem Relation Age of Onset   Breast cancer Mother        80's   Heart attack Mother    Diabetes Mother    Stroke Father    Diabetes Father    Pancreatic cancer Sister    Cancer Sister        pancreatic   Cancer Brother        liver   Diabetes Brother    Cancer Sister        anal   Breast cancer Sister    Heart attack Brother    Diabetes Brother    Cancer Brother        stomach   Cancer Brother    Diabetes Brother    Colon cancer Neg Hx    Social History   Socioeconomic History   Marital status: Married    Spouse name: Not on file   Number of children: 2   Years of education: Not on file   Highest education level: Not on file  Occupational History   Occupation: Retired Education officer, museum  Tobacco Use   Smoking status: Former    Years: 1.00    Types: Cigarettes   Smokeless tobacco: Never   Tobacco comments:    Smoked x 1 year in her 20's  Vaping Use   Vaping Use: Never used  Substance and Sexual Activity   Alcohol use: No   Drug use: No   Sexual activity: Never  Other Topics Concern   Not on file  Social History Narrative   Daily Caffeine Use:  One cup coffee in am     Regular Exercise -  5 days a week x 1 hour - gym, aerobic and wt training         Social Determinants of Health   Financial Resource Strain: Low Risk    Difficulty of Paying Living Expenses: Not hard at all  Food Insecurity: No Food Insecurity   Worried About Charity fundraiser in the Last Year: Never true   [place] in the Last Year: Never true  Transportation Needs: No Transportation Needs   Lack of Transportation (Medical): No   Lack of Transportation (Non-Medical): No  Physical Activity: Sufficiently Active   Days of Exercise per Week: 5 days   Minutes of Exercise per Session: 60 min  Stress: No Stress Concern Present   Feeling of Stress : Not at all  Social Connections: Unknown   Frequency of Communication with Friends and Family: More than three times a week   Frequency of Social Gatherings with Friends and Family: Three times a week   Attends Religious Services: Not on file   Active Member of Clubs or Organizations: Not on file   Attends Archivist Meetings: Not on file   Marital Status: Married    Tobacco Counseling Counseling given: Not Answered Tobacco comments: Smoked x 1 year in her 20's   Clinical Intake:  Pre-visit preparation completed: Yes        Diabetes: No  How often do you need to have someone help you when you read instructions, pamphlets, or other written materials from your doctor or pharmacy?: 1 - Never    Interpreter Needed?: No      Activities of Daily Living In your present state of health, do you have any difficulty performing the following activities: 08/13/2021 06/25/2021  Hearing? N N  Vision? N N  Difficulty concentrating or making decisions? N N  Walking or climbing stairs? N N  Dressing or bathing? N N  Doing errands, shopping? N -  Preparing Food and eating ? N -  Using the Toilet? N -  In the past six months, have you accidently leaked urine? N -  Do you have problems with loss of bowel control? N -   Managing your Medications? N -  Managing your Finances? N -  Housekeeping or managing your Housekeeping? N -  Some recent data might be hidden    Patient Care Team: [name], MD as PCP - General (Internal Medicine) Minna Merritts, MD as PCP - Cardiology (Cardiology)  Indicate any recent Medical Services you may have received from other than [facility] providers in the past year (date may be approximate).     Assessment:   This is a routine wellness examination for Aamani.  I connected with Nasiah today by telephone and verified that I am speaking with the correct person using two identifiers. Location patient: home Location provider: work Persons participating in the virtual visit: patient, Marine scientist.    I discussed the limitations, risks, security and privacy concerns of performing an evaluation and management service by telephone and the availability of in person appointments. The patient expressed understanding and verbally consented to this telephonic visit.    Interactive audio and video telecommunications were attempted between this provider and patient, however failed, due to patient having technical difficulties OR patient did not have access to video capability.  We continued and completed visit with audio only.  Some vital signs may be absent or patient reported.   Hearing/Vision screen Hearing Screening - Comments:: Patient is able to hear conversational tones without difficulty.  No issues reported.   Vision Screening - Comments:: Followed by [facility]  Wears corrective lenses  Lid lift surgery  Annual visits  Cataract extraction, bilateral  They have regular follow up with the ophthalmologist  Dietary issues and exercise activities discussed: Current Exercise Habits: Home exercise routine, Type of exercise: treadmill;walking, Time (Minutes): 60, Frequency (Times/Week): 5, Weekly Exercise (Minutes/Week): 300, Intensity: Mild   Goals Addressed              This Visit's Progress    Low carb  diet   On track      Depression Screen PHQ 2/9 Scores 08/13/2021 04/22/2021 03/25/2021 08/12/2020 08/10/2019 07/31/2018 07/29/2017  PHQ - 2 Score 0 0 0 0 0 0 0  PHQ- 9 Score - - 2 - - - 0    Fall Risk Fall Risk  08/13/2021 04/22/2021 03/25/2021 09/24/2020 08/12/2020  Falls in the past year? 0 0 0 0 0  Number falls in past yr: 0 0 - - 0  Injury with Fall? 0 0 - - 0  Follow up _0     FALL RISK PREVENTION PERTAINING TO THE HOME: Adequate lighting in your home to reduce risk of falls? Yes   ASSISTIVE DEVICES UTILIZED TO PREVENT FALLS: Life alert? Yes  Use of a cane, walker or w/c? No   TIMED UP AND GO: Was the test performed? No .   Cognitive Function: Patient is alert and oriented x3.  Enjoys word games, puzzles and other brain health activities.  MMSE/6CIT deferred. Normal by direct communication/observation.   MMSE - Mini Mental State Exam 07/31/2018 07/29/2017 07/27/2016 07/25/2015  Orientation to time _1 Orientation to Place _2 Registration _3 Attention/ Calculation _4 Recall _5 Language- name 2 objects _6 Language- repeat _7 Language- follow 3 step command _8 Language- read & follow direction _9 Write a sentence _10 Copy design _11 Total score _12 6CIT Screen 08/10/2019  What Year? 0 points  What month? 0 points  What time? 0 points  Count back from 20 0 points  Months in reverse 0 points  Repeat phrase 0 points  Total Score 0    Immunizations Immunization History  Administered Date(s) Administered   Influenza Split 08/18/2011, 08/04/2012   Influenza Whole 09/10/2013   Influenza, High Dose Seasonal PF 07/25/2015, 07/27/2016, 07/29/2017, 07/31/2018, 08/09/2019   Influenza,inj,Quad PF,6+ Mos 09/06/2014   Influenza,inj,quad, With  Preservative 07/25/2018   Influenza-Unspecified 08/24/2020   PFIZER(Purple Top)SARS-COV-2 Vaccination 12/25/2019, 01/15/2020, 09/25/2020   Pneumococcal Conjugate-13 12/07/2013   Pneumococcal Polysaccharide-23 12/18/2007, 03/06/2018   Tdap 11/20/2012, 09/29/2016   Zoster Recombinat (Shingrix) 09/24/2018, 12/06/2018   Zoster, Live 12/18/2007   Health Maintenance Health Maintenance  Topic Date Due   COVID-19 Vaccine (4 - Booster for [place] series) 08/29/2021 (Originally 12/18/2020)   INFLUENZA VACCINE  02/19/2022 (Originally 06/22/2021)   MAMMOGRAM  04/02/2022   TETANUS/TDAP  09/29/2026   DEXA SCAN  Completed   Zoster Vaccines- Shingrix  Completed   HPV VACCINES  Aged Out   Lung Cancer Screening: (Low Dose CT Chest recommended if Age 58-80 years, 30 pack-year currently smoking OR have quit w/in 15years.) does not qualify.   Hepatitis C Screening: does not qualify  Vision Screening: Recommended annual ophthalmology exams for early detection of glaucoma and other disorders of the eye.  Dental Screening: Recommended annual dental exams for proper oral hygiene.  Community Resource Referral / Chronic Care Management: CRR required this visit?  No   CCM required this visit?  No      Plan:   Keep all routine maintenance appointments.   I have personally reviewed and noted the following in the patient's chart:   Medical and social  history Use of alcohol, tobacco or illicit drugs  Current medications and supplements including opioid prescriptions. Not taking opioid.  Functional ability and status Nutritional status Physical activity Advanced directives List of other physicians Hospitalizations, surgeries, and ER visits in previous 12 months Vitals Screenings to include cognitive, depression, and falls Referrals and appointments  In addition, I have reviewed and discussed with patient certain preventive protocols, quality metrics, and best practice recommendations. A written  personalized care plan for preventive services as well as general preventive health recommendations were provided to patient via mychart.     OBrien-Blaney, Neftali Thurow L, LPN   8/86/[id number]     I have reviewed the above information and agree with above.   Deborra Medina, MD

## 2021-08-14 ENCOUNTER — Other Ambulatory Visit: Payer: Self-pay | Admitting: Internal Medicine

## 2021-08-27 ENCOUNTER — Ambulatory Visit: Payer: Medicare Other | Admitting: Dermatology

## 2021-08-27 DIAGNOSIS — H43813 Vitreous degeneration, bilateral: Secondary | ICD-10-CM | POA: Diagnosis not present

## 2021-09-18 ENCOUNTER — Other Ambulatory Visit: Payer: Self-pay | Admitting: Family

## 2021-09-18 DIAGNOSIS — K449 Diaphragmatic hernia without obstruction or gangrene: Secondary | ICD-10-CM

## 2021-09-25 ENCOUNTER — Encounter: Payer: Self-pay | Admitting: Internal Medicine

## 2021-09-25 ENCOUNTER — Ambulatory Visit (INDEPENDENT_AMBULATORY_CARE_PROVIDER_SITE_OTHER): Payer: Medicare Other | Admitting: Internal Medicine

## 2021-09-25 ENCOUNTER — Other Ambulatory Visit: Payer: Self-pay

## 2021-09-25 VITALS — BP 148/86 | HR 49 | Temp 95.7°F | Ht 66.0 in | Wt 134.4 lb

## 2021-09-25 DIAGNOSIS — E039 Hypothyroidism, unspecified: Secondary | ICD-10-CM | POA: Diagnosis not present

## 2021-09-25 DIAGNOSIS — R0789 Other chest pain: Secondary | ICD-10-CM | POA: Diagnosis not present

## 2021-09-25 DIAGNOSIS — I700 Atherosclerosis of aorta: Secondary | ICD-10-CM

## 2021-09-25 DIAGNOSIS — R300 Dysuria: Secondary | ICD-10-CM

## 2021-09-25 DIAGNOSIS — R7303 Prediabetes: Secondary | ICD-10-CM | POA: Diagnosis not present

## 2021-09-25 DIAGNOSIS — E7849 Other hyperlipidemia: Secondary | ICD-10-CM | POA: Diagnosis not present

## 2021-09-25 DIAGNOSIS — R944 Abnormal results of kidney function studies: Secondary | ICD-10-CM | POA: Diagnosis not present

## 2021-09-25 LAB — URINALYSIS, ROUTINE W REFLEX MICROSCOPIC
Bilirubin Urine: NEGATIVE
Nitrite: NEGATIVE
Specific Gravity, Urine: 1.02 (ref 1.000–1.030)
Total Protein, Urine: NEGATIVE
Urine Glucose: NEGATIVE
Urobilinogen, UA: 0.2 (ref 0.0–1.0)
pH: 6 (ref 5.0–8.0)

## 2021-09-25 LAB — LIPID PANEL
Cholesterol: 205 mg/dL — ABNORMAL HIGH (ref 0–200)
HDL: 65.4 mg/dL (ref >39.00)
LDL Cholesterol: 115 mg/dL — ABNORMAL HIGH (ref 0–99)
NonHDL: 139.1
Total CHOL/HDL Ratio: 3
Triglycerides: 121 mg/dL (ref 0.0–149.0)
VLDL: 24.2 mg/dL (ref 0.0–40.0)

## 2021-09-25 LAB — COMPREHENSIVE METABOLIC PANEL
ALT: 24 U/L (ref 0–35)
AST: 27 U/L (ref 0–37)
Albumin: 4.3 g/dL (ref 3.5–5.2)
Alkaline Phosphatase: 40 U/L (ref 39–117)
BUN: 17 mg/dL (ref 6–23)
CO2: 32 mEq/L (ref 19–32)
Calcium: 9.6 mg/dL (ref 8.4–10.5)
Chloride: 102 mEq/L (ref 96–112)
Creatinine, Ser: 0.92 mg/dL (ref 0.40–1.20)
GFR: 58.87 mL/min — ABNORMAL LOW (ref >60.00)
Glucose, Bld: 86 mg/dL (ref 70–99)
Potassium: 4.9 mEq/L (ref 3.5–5.1)
Sodium: 142 mEq/L (ref 135–145)
Total Bilirubin: 0.4 mg/dL (ref 0.2–1.2)
Total Protein: 7.1 g/dL (ref 6.0–8.3)

## 2021-09-25 LAB — TSH: TSH: 3.21 u[IU]/mL (ref 0.35–5.50)

## 2021-09-25 MED ORDER — TELMISARTAN 40 MG PO TABS: 40.0000 mg | ORAL_TABLET | Freq: Every day | ORAL | 1 refills | Status: DC

## 2021-09-25 NOTE — Patient Instructions (Signed)
Please schedule a follow up visit with cardiology for persistent episodes of chest pain

## 2021-09-25 NOTE — Assessment & Plan Note (Signed)
Reviewed findings of prior CT scan today..  Patient is tolerating high potency statin therapy  

## 2021-09-25 NOTE — Progress Notes (Signed)
Subjective:  Patient ID: [name], female    DOB: 1941-10-16  Age: 80 y.o. MRN: [id number]  CC: The primary encounter diagnosis was Acquired hypothyroidism. Diagnoses of Abdominal aortic atherosclerosis ([place]), Familial hyperlipidemia, Dysuria, Atypical chest pain, and Prediabetes were also pertinent to this visit.  HPI CHLORA MCBAIN presents for    Chief Complaint  Patient presents with   Follow-up    6 month follow up on hypothyroidism, hypertension, hyperlipidemia   This visit occurred during the SARS-CoV-2 public health emergency.  Safety protocols were in place, including screening questions prior to the visit, additional usage of staff PPE, and extensive cleaning of exam room while observing appropriate contact time as indicated for disinfecting solutions.   Since her last visit she has had an ER evaluation for dehydration secondary to prolonged diarrhea with BRBPR on June 1.  Labs were normal,  stool was heme negative .  She followed up with Dr Allen Norris for concerns of an upper GI bleed and underwent EGD which was normal except for small hiatal hernia and candida esophagitis.   She continues to have atypical chest  pain has been described by patient as "crushing"  when brought on by moving her physically disabled husband Fritz Pickerel who weighs 250 lbs.  It has not occurred  with walking for exercise,  only with using her arms to lift and  move husband.  Occurs also often with eating.    She reports increased caregiver stress: husband had a major CVA 10 yrs ago which has left him disabled and dependent on her.  Her gets frustrated and talks loudly but not abusively. He is not conversational.  Her daughter  [name] is helping out, so she is  getting more opportunities for respite with friends.  Her mood is grateful most days,  she denies depression   HTN:  Hypertension: patient  has not checked blood pressure  lately due to house being painted . Home readings  have been for the most part >  140/80 at rest . Patient is following a reduced salt diet most days and is taking medications as prescribed    Outpatient Medications Prior to Visit  Medication Sig Dispense Refill   aspirin EC 81 MG tablet Take 81 mg by mouth daily.     cholecalciferol (VITAMIN D) 1000 UNITS tablet Take 2,000 Units by mouth daily.      diltiazem (CARDIZEM CD) 120 MG 24 hr capsule Take 1 capsule (120 mg total) by mouth daily. 90 capsule 3   diphenhydramine-acetaminophen (TYLENOL PM) 25-500 MG TABS tablet Take 1 tablet by mouth at bedtime as needed.     glucosamine-chondroitin 500-400 MG tablet Take 1 tablet by mouth daily.      levothyroxine (SYNTHROID) 50 MCG tablet TAKE 1 TABLET BY MOUTH  DAILY 90 tablet 3   mometasone (ELOCON) 0.1 % lotion SMARTSIG:2-4 Drop(s) In Ear(s) Every Night PRN     Multiple Vitamins-Minerals (MULTIVITAMIN WITH MINERALS) tablet Take 1 tablet by mouth daily.     Omega-3 Fatty Acids (FISH OIL) 1000 MG CPDR Take by mouth daily.     pantoprazole (PROTONIX) 20 MG tablet TAKE 1 TABLET BY MOUTH EVERY DAY 90 tablet 1   rosuvastatin (CRESTOR) 10 MG tablet TAKE 1 TABLET BY MOUTH  DAILY 90 tablet 3   traZODone (DESYREL) 50 MG tablet TAKE 1/2 TO 1 TABLET BY MOUTH AT BEDTIME AS NEEDED FOR SLEEP 90 tablet 2   telmisartan (MICARDIS) 20 MG tablet TAKE 1 TABLET (20 MG  TOTAL) BY MOUTH DAILY. IN THE MORNING FOR BLOOD PRESSURE 90 tablet 1   fluconazole (DIFLUCAN) 100 MG tablet Take 1 tablet (100 mg total) by mouth daily. (Patient not taking: Reported on 09/25/2021) 14 tablet 0   fluorouracil (EFUDEX) 5 % cream Apply topically 2 (two) times daily. For 4 days (Patient not taking: Reported on 09/25/2021) 15 g 0   No facility-administered medications prior to visit.    Review of Systems;  Patient denies headache, fevers, malaise, unintentional weight loss, skin rash, eye pain, sinus congestion and sinus pain, sore throat, dysphagia,  hemoptysis , cough, dyspnea, wheezing, , palpitations, orthopnea, edema,  abdominal pain, nausea, melena, diarrhea, constipation, flank pain, dysuria, hematuria, urinary  Frequency, nocturia, numbness, tingling, seizures,  Focal weakness, Loss of consciousness,  Tremor, insomnia, depression,  and suicidal ideation.      Objective:  BP (!) 148/86 (BP Location: Left Arm, Patient Position: Sitting, Cuff Size: Normal)   Pulse (!) 49   Temp (!) 95.7 F (35.4 C) (Temporal)   Ht 5\' 6"  (1.676 m)   Wt 134 lb 6.4 oz (61 kg)   SpO2 99%   BMI 21.69 kg/m   BP Readings from Last 3 Encounters:  09/25/21 (!) 148/86  06/25/21 140/70  05/28/21 121/66    Wt Readings from Last 3 Encounters:  09/25/21 134 lb 6.4 oz (61 kg)  08/13/21 132 lb (59.9 kg)  06/25/21 132 lb (59.9 kg)    General appearance: alert, cooperative and appears stated age Ears: normal TM's and external ear canals both ears Throat: lips, mucosa, and tongue normal; teeth and gums normal Neck: no adenopathy, no carotid bruit, supple, symmetrical, trachea midline and thyroid not enlarged, symmetric, no tenderness/mass/nodules Back: symmetric, no curvature. ROM normal. No CVA tenderness. Lungs: clear to auscultation bilaterally Heart: regular rate and rhythm, S1, S2 normal, no murmur, click, rub or gallop Abdomen: soft, non-tender; bowel sounds normal; no masses,  no organomegaly Pulses: 2+ and symmetric Skin: Skin color, texture, turgor normal. No rashes or lesions Lymph nodes: Cervical, supraclavicular, and axillary nodes normal.  Lab Results  Component Value Date   HGBA1C 5.9 03/25/2021   HGBA1C 6.2 09/24/2020   HGBA1C 5.9 03/20/2020    Lab Results  Component Value Date   CREATININE 0.92 09/25/2021   CREATININE 0.86 03/25/2021   CREATININE 0.80 09/24/2020    Lab Results  Component Value Date   WBC 5.2 03/20/2020   HGB 12.7 03/20/2020   HCT 37.7 03/20/2020   PLT 242.0 03/20/2020   GLUCOSE 86 09/25/2021   CHOL 205 (H) 09/25/2021   TRIG 121.0 09/25/2021   HDL 65.40 09/25/2021    LDLDIRECT 85.0 03/06/2018   LDLCALC 115 (H) 09/25/2021   ALT 24 09/25/2021   AST 27 09/25/2021   NA 142 09/25/2021   K 4.9 09/25/2021   CL 102 09/25/2021   CREATININE 0.92 09/25/2021   BUN 17 09/25/2021   CO2 32 09/25/2021   TSH 3.21 09/25/2021   HGBA1C 5.9 03/25/2021   MICROALBUR <0.7 09/13/2019    No results found.  Assessment & Plan:   Problem List Items Addressed This Visit     Prediabetes    Her  a1c has risen slightly in spite of adherence to low glycemic index diet.  Encouraged to increase her exercise  Will check annually   Lab Results  Component Value Date   HGBA1C 5.9 03/25/2021         Familial hyperlipidemia   Relevant Medications   telmisartan (MICARDIS) 40  MG tablet   Other Relevant Orders   Lipid panel (Completed)   Comprehensive metabolic panel (Completed)   Atypical chest pain    Reviewed prior workup with patient. .  Reassured her that the pain is due to strain of chest wall muscles.  PT referral offered but deferred for now. Annual fFollow up with cardiology is due       Acquired hypothyroidism - Primary   Relevant Orders   TSH (Completed)   Abdominal aortic atherosclerosis ([place])    Reviewed findings of prior CT scan today..  Patient is tolerating high potency statin therapy       Relevant Medications   telmisartan (MICARDIS) 40 MG tablet   Other Visit Diagnoses     Dysuria       Relevant Orders   Urinalysis, Routine w reflex microscopic (Completed)   Urine Culture       I have discontinued Verdene Lennert. Blocher's fluorouracil and fluconazole. I have also changed her telmisartan. Additionally, I am having her maintain her aspirin EC, multivitamin with minerals, glucosamine-chondroitin, cholecalciferol, Fish Oil, diphenhydramine-acetaminophen, diltiazem, mometasone, levothyroxine, rosuvastatin, traZODone, and pantoprazole.  Meds ordered this encounter  Medications   telmisartan (MICARDIS) 40 MG tablet    Sig: Take 1 tablet (40 mg  total) by mouth daily. In the morning  for blood pressure    Dispense:  90 tablet    Refill:  1    NOTE DOSE INCREASE    Medications Discontinued During This Encounter  Medication Reason   fluconazole (DIFLUCAN) 100 MG tablet    fluorouracil (EFUDEX) 5 % cream Completed Course   telmisartan (MICARDIS) 20 MG tablet     Follow-up: No follow-ups on file.   [name], MD

## 2021-09-26 NOTE — Addendum Note (Signed)
Addended by: [name] on: 09/26/2021 10:21 PM   Modules accepted: Orders

## 2021-09-26 NOTE — Assessment & Plan Note (Addendum)
Reviewed prior workup with patient. .  Reassured her that the pain is due to strain of chest wall muscles.  PT referral offered but deferred for now. Annual fFollow up with cardiology is due

## 2021-09-26 NOTE — Assessment & Plan Note (Signed)
Her  a1c has risen slightly in spite of adherence to low glycemic index diet.  Encouraged to increase her exercise  Will check annually   Lab Results  Component Value Date   HGBA1C 5.9 03/25/2021

## 2021-09-27 LAB — URINE CULTURE
MICRO NUMBER:: 12595889
SPECIMEN QUALITY:: ADEQUATE

## 2021-09-29 ENCOUNTER — Encounter: Payer: Self-pay | Admitting: Internal Medicine

## 2021-09-29 ENCOUNTER — Other Ambulatory Visit: Payer: Self-pay | Admitting: Internal Medicine

## 2021-09-29 MED ORDER — CEPHALEXIN 500 MG PO CAPS: 500.0000 mg | ORAL_CAPSULE | Freq: Four times a day (QID) | ORAL | 0 refills | Status: DC

## 2021-10-29 ENCOUNTER — Telehealth: Payer: Self-pay | Admitting: Internal Medicine

## 2021-10-29 DIAGNOSIS — R300 Dysuria: Secondary | ICD-10-CM

## 2021-10-29 NOTE — Telephone Encounter (Signed)
Urine ordered, both appointments have been made.

## 2021-10-29 NOTE — Telephone Encounter (Signed)
Patient called in thinks has UTI and also called in about Labs , trying to  come by today . Please call patient [phone]

## 2021-10-29 NOTE — Telephone Encounter (Signed)
Is it okay to order labs and schedule 15 minute virtual on Monday at 1:15pm?

## 2021-10-30 ENCOUNTER — Other Ambulatory Visit (INDEPENDENT_AMBULATORY_CARE_PROVIDER_SITE_OTHER): Payer: Medicare Other

## 2021-10-30 ENCOUNTER — Other Ambulatory Visit: Payer: Self-pay

## 2021-10-30 DIAGNOSIS — R300 Dysuria: Secondary | ICD-10-CM | POA: Diagnosis not present

## 2021-10-30 LAB — URINALYSIS, ROUTINE W REFLEX MICROSCOPIC
Bilirubin Urine: NEGATIVE
Hgb urine dipstick: NEGATIVE
Ketones, ur: NEGATIVE
Leukocytes,Ua: NEGATIVE
Nitrite: NEGATIVE
Specific Gravity, Urine: 1.01 (ref 1.000–1.030)
Total Protein, Urine: NEGATIVE
Urine Glucose: NEGATIVE
Urobilinogen, UA: 0.2 (ref 0.0–1.0)
pH: 7.5 (ref 5.0–8.0)

## 2021-10-31 LAB — URINE CULTURE
MICRO NUMBER:: 12737946
Result:: NO GROWTH
SPECIMEN QUALITY:: ADEQUATE

## 2021-11-02 ENCOUNTER — Encounter: Payer: Self-pay | Admitting: Internal Medicine

## 2021-11-02 ENCOUNTER — Ambulatory Visit (INDEPENDENT_AMBULATORY_CARE_PROVIDER_SITE_OTHER): Payer: Medicare Other | Admitting: Internal Medicine

## 2021-11-02 DIAGNOSIS — I10 Essential (primary) hypertension: Secondary | ICD-10-CM

## 2021-11-02 DIAGNOSIS — R300 Dysuria: Secondary | ICD-10-CM

## 2021-11-02 NOTE — Assessment & Plan Note (Signed)
Well controlled on current regimen with increased telmisartan dose . no changes today.  Lab Results  Component Value Date   CREATININE 0.92 09/25/2021   Lab Results  Component Value Date   NA 142 09/25/2021   K 4.9 09/25/2021   CL 102 09/25/2021   CO2 32 09/25/2021

## 2021-11-02 NOTE — Assessment & Plan Note (Signed)
Symptoms of UTI have resolved with AZO (x 2 days) and increased water intake, and UA/culture were negative for infection so no antibiotic was given .  Advised to resume daily use of cranberry tablets/juice .  Will treat empirically for E  COLI if symptoms return (based on Nov 2022 culture data)

## 2021-11-02 NOTE — Progress Notes (Signed)
Virtual Visit via Telephone  Note  This visit type was conducted due to national recommendations for restrictions regarding the COVID-19 pandemic (e.g. social distancing).  This format is felt to be most appropriate for this patient at this time.  All issues noted in this document were discussed and addressed.  No physical exam was performed (except for noted visual exam findings with Video Visits).   I attempted to connect  withNAME@ on 11/02/21 at  1:15 PM EST by a video enabled telemedicine application or telephone and verified that I am speaking with the correct person using two identifiers. Location patient: home Location provider: work or home office Persons participating in the virtual visit: patient, provider  I discussed the limitations, risks, security and privacy concerns of performing an evaluation and management service by telephone and the availability of in person appointments. I also discussed with the patient that there may be a patient responsible charge related to this service. The patient expressed understanding and agreed to proceed.  Interactive audio and video telecommunications were attempted between this provider and patient, however failed, due to patient  did not have access to video capability.  We continued and completed visit with audio only.   Reason for visit: follow up on dysuria and hypertension   HPI:  80 yr old female presents after submitting urine for rule out UTI.patient has been having sudden onset of  bladder /urethral pain described as stabbing,  burning and pressure on  Dec 8.   Symptoms were similar to Nov 4 episode .  Current UA was normal except for 3-6 WBC's,  and urine culture was negative. Patient Started taking AZO  on Dec 8 and increased her water intake.  Took the AZO for 2 days,  and then stopped.  Has hd no recurrence of symptoms   Hypertension: patient checks blood pressure twice weekly at home.  Readings have been < 140/80 at rest . Patient  is following a reduced salt diet most days and is taking telmisartan 40 mg and cardizem  CD120 mg daily  as prescribed    ROS: See pertinent positives and negatives per HPI.  Past Medical History:  Diagnosis Date   Arthritis    knees   Basal cell carcinoma 10/23/2020   right lateral ankle EDC done 12/18/20   Basal cell carcinoma 05/13/2021   right pretibia, [facility] 07/22/21   Basal cell carcinoma ([place]) 05/13/2021   left forehead, Moh's   History of dysplastic nevus 05/13/2021   right dorsal hand, mild   Hyperlipidemia    Hypothyroidism    PVC's (premature ventricular contractions)    a.  48-hour Holter in 3/17 demonstrated NSR, frequent PVCs totaling 5300 beats representing 3% burden, occasional PACs, short runs of SVT    Past Surgical History:  Procedure Laterality Date   ABDOMINAL HYSTERECTOMY     In 30's   BROW LIFT Bilateral 07/12/2017   Procedure: BLEPHAROPLASTY upper eyelid with excess skin;  Surgeon: Karle Starch, MD;  Location: [place];  Service: Ophthalmology;  Laterality: Bilateral;  MAC   COLONOSCOPY WITH PROPOFOL N/A 12/29/2015   Procedure: COLONOSCOPY WITH PROPOFOL;  Surgeon: Hulen Luster, MD;  Location: [facility] ENDOSCOPY;  Service: Gastroenterology;  Laterality: N/A;   COLONOSCOPY WITH PROPOFOL N/A 12/03/2019   Procedure: COLONOSCOPY WITH BIOPSY;  Surgeon: [name], MD;  Location: [place];  Service: Endoscopy;  Laterality: N/A;   ESOPHAGOGASTRODUODENOSCOPY (EGD) WITH PROPOFOL N/A 06/25/2021   Procedure: ESOPHAGOGASTRODUODENOSCOPY (EGD) WITH PROPOFOL;  Surgeon: Allen Norris,  Darren, MD;  Location: [place];  Service: Endoscopy;  Laterality: N/A;   POLYPECTOMY N/A 12/03/2019   Procedure: POLYPECTOMY;  Surgeon: [name], MD;  Location: [place];  Service: Endoscopy;  Laterality: N/A;   PTOSIS REPAIR Bilateral 07/12/2017   Procedure: ptosis repair resect ex;  Surgeon: Karle Starch, MD;  Location: [place];  Service: Ophthalmology;   Laterality: Bilateral;  Requests early   VAGINAL PROLAPSE REPAIR  2014   Dr. Enzo Bi    Family History  Problem Relation Age of Onset   Breast cancer Mother        33's   Heart attack Mother    Diabetes Mother    Stroke Father    Diabetes Father    Pancreatic cancer Sister    Cancer Sister        pancreatic   Cancer Brother        liver   Diabetes Brother    Cancer Sister        anal   Breast cancer Sister    Heart attack Brother    Diabetes Brother    Cancer Brother        stomach   Cancer Brother    Diabetes Brother    Colon cancer Neg Hx     SOCIAL HX:  reports that she has quit smoking. Her smoking use included cigarettes. She has never used smokeless tobacco. She reports that she does not drink alcohol and does not use drugs.   Current Outpatient Medications:    aspirin EC 81 MG tablet, Take 81 mg by mouth daily., Disp: , Rfl:    cholecalciferol (VITAMIN D) 1000 UNITS tablet, Take 2,000 Units by mouth daily. , Disp: , Rfl:    diltiazem (CARDIZEM CD) 120 MG 24 hr capsule, Take 1 capsule (120 mg total) by mouth daily., Disp: 90 capsule, Rfl: 3   diphenhydramine-acetaminophen (TYLENOL PM) 25-500 MG TABS tablet, Take 1 tablet by mouth at bedtime as needed., Disp: , Rfl:    glucosamine-chondroitin 500-400 MG tablet, Take 1 tablet by mouth daily. , Disp: , Rfl:    levothyroxine (SYNTHROID) 50 MCG tablet, TAKE 1 TABLET BY MOUTH  DAILY, Disp: 90 tablet, Rfl: 3   mometasone (ELOCON) 0.1 % lotion, SMARTSIG:2-4 Drop(s) In Ear(s) Every Night PRN, Disp: , Rfl:    Multiple Vitamins-Minerals (MULTIVITAMIN WITH MINERALS) tablet, Take 1 tablet by mouth daily., Disp: , Rfl:    Omega-3 Fatty Acids (FISH OIL) 1000 MG CPDR, Take by mouth daily., Disp: , Rfl:    pantoprazole (PROTONIX) 20 MG tablet, TAKE 1 TABLET BY MOUTH EVERY DAY, Disp: 90 tablet, Rfl: 1   rosuvastatin (CRESTOR) 10 MG tablet, TAKE 1 TABLET BY MOUTH  DAILY, Disp: 90 tablet, Rfl: 3   telmisartan (MICARDIS) 40 MG  tablet, Take 1 tablet (40 mg total) by mouth daily. In the morning  for blood pressure, Disp: 90 tablet, Rfl: 1   traZODone (DESYREL) 50 MG tablet, TAKE 1/2 TO 1 TABLET BY MOUTH AT BEDTIME AS NEEDED FOR SLEEP, Disp: 90 tablet, Rfl: 2  EXAM:  VITALS per patient if applicable:  GENERAL: alert, oriented, appears well and in no acute distress  HEENT: atraumatic, conjunttiva clear, no obvious abnormalities on inspection of external nose and ears  NECK: normal movements of the head and neck  LUNGS: on inspection no signs of respiratory distress, breathing rate appears normal, no obvious gross SOB, gasping or wheezing  CV: no obvious cyanosis  MS: moves all visible  extremities without noticeable abnormality  PSYCH/NEURO: pleasant and cooperative, no obvious depression or anxiety, speech and thought processing grossly intact  ASSESSMENT AND PLAN:  Discussed the following assessment and plan:  Dysuria  Essential hypertension  Dysuria Symptoms of UTI have resolved with AZO (x 2 days) and increased water intake, and UA/culture were negative for infection so no antibiotic was given .  Advised to resume daily use of cranberry tablets/juice .  Will treat empirically for E  COLI if symptoms return (based on Nov 2022 culture data)  Essential hypertension Well controlled on current regimen with increased telmisartan dose . no changes today.  Lab Results  Component Value Date   CREATININE 0.92 09/25/2021   Lab Results  Component Value Date   NA 142 09/25/2021   K 4.9 09/25/2021   CL 102 09/25/2021   CO2 32 09/25/2021     I discussed the assessment and treatment plan with the patient. The patient was provided an opportunity to ask questions and all were answered. The patient agreed with the plan and demonstrated an understanding of the instructions.   The patient was advised to call back or seek an in-person evaluation if the symptoms worsen or if the condition fails to improve as  anticipated.   I spent 30 minutes dedicated to the care of this patient on the date of this encounter to include pre-visit review of his medical history,  Face-to-face time with the patient , and post visit ordering of testing and therapeutics.    [name], MD

## 2021-11-21 ENCOUNTER — Other Ambulatory Visit: Payer: Self-pay | Admitting: Internal Medicine

## 2021-11-25 ENCOUNTER — Encounter: Payer: Self-pay | Admitting: Cardiovascular Disease

## 2021-11-25 ENCOUNTER — Ambulatory Visit: Payer: Medicare Other | Admitting: Cardiovascular Disease

## 2021-11-25 ENCOUNTER — Other Ambulatory Visit: Payer: Self-pay

## 2021-11-25 VITALS — BP 164/70 | HR 58 | Ht 68.0 in | Wt 136.5 lb

## 2021-11-25 DIAGNOSIS — I493 Ventricular premature depolarization: Secondary | ICD-10-CM

## 2021-11-25 DIAGNOSIS — I700 Atherosclerosis of aorta: Secondary | ICD-10-CM | POA: Diagnosis not present

## 2021-11-25 DIAGNOSIS — E782 Mixed hyperlipidemia: Secondary | ICD-10-CM | POA: Diagnosis not present

## 2021-11-25 DIAGNOSIS — J9859 Other diseases of mediastinum, not elsewhere classified: Secondary | ICD-10-CM | POA: Diagnosis not present

## 2021-11-25 DIAGNOSIS — I10 Essential (primary) hypertension: Secondary | ICD-10-CM | POA: Diagnosis not present

## 2021-11-25 MED ORDER — DILTIAZEM HCL ER COATED BEADS 120 MG PO CP24: 120.0000 mg | ORAL_CAPSULE | Freq: Every day | ORAL | 3 refills | Status: DC

## 2021-11-25 MED ORDER — ISOSORBIDE MONONITRATE ER 30 MG PO TB24: 30.0000 mg | ORAL_TABLET | Freq: Every day | ORAL | 3 refills | Status: DC

## 2021-11-25 NOTE — Progress Notes (Signed)
Cardiology Office Note  Date:  11/25/2021   ID:  [name], DOB 09/01/1941, MRN [id number]  PCP:  [name], MD   Chief Complaint  Patient presents with   Chest Pain    Patient c/o chest pressure with pain in her teeth and jaw with and without exertion. Medications reviewed by the patient verbally.     HPI:  [name] is a 81 y.o. female with history of  symptomatic PVCs,  hypothyroidism, and  hyperlipidemia  who presents for follow-up of PVCs. Cardiac CTA 2019, no coronary artery disease, calcium score 0 2.0  cm anterior mediastinal mass or lymph node on CT 2022 seen in clinic 2019 [facility] [place], Dr. Fletcher Anon in 2018  [place]   11/2019   Discussed new symptoms of chest discomfort Chest pain for past 6 months, pain in teeth Less than one minute at a time Will lay down and goes away Was once a week, now gone for the past 2 weeks   Prior cardiac CTA 2019, no coronary artery disease  More fatigue, stress at home, taking care of husband who has dementia/stroke Daughter helps  Foot pain, wonders if it could be PAD  EKG personally reviewed by myself on todays visit NSr rate 58 no ST or T wave changes   10/2015 with chest pain and underwent a treadmill nuclear stress test which showed no evidence of ischemia with a low normal EF. hypertensive on presentation with hypertensive response to exercise.  started on metoprolol tartrate 25 mg twice daily.  However, she did not tolerate this medication secondary to fatigue.    A Holter was performed in 01/2016 and showed 5300 PVCs in 48 hours representing a 3% burden, occasional PACs, and short runs of SVT.    PMH:   has a past medical history of Arthritis, Basal cell carcinoma (10/23/2020), Basal cell carcinoma (05/13/2021), Basal cell carcinoma (BCC) (05/13/2021), History of dysplastic nevus (05/13/2021), Hyperlipidemia, Hypothyroidism, and PVC's (premature ventricular contractions).  PSH:    Past Surgical History:   Procedure Laterality Date   ABDOMINAL HYSTERECTOMY     In 21's   BROW LIFT Bilateral 07/12/2017   Procedure: BLEPHAROPLASTY upper eyelid with excess skin;  Surgeon: Karle Starch, MD;  Location: [place];  Service: Ophthalmology;  Laterality: Bilateral;  MAC   COLONOSCOPY WITH PROPOFOL N/A 12/29/2015   Procedure: COLONOSCOPY WITH PROPOFOL;  Surgeon: Hulen Luster, MD;  Location: [facility] ENDOSCOPY;  Service: Gastroenterology;  Laterality: N/A;   COLONOSCOPY WITH PROPOFOL N/A 12/03/2019   Procedure: COLONOSCOPY WITH BIOPSY;  Surgeon: [name], MD;  Location: [place];  Service: Endoscopy;  Laterality: N/A;   ESOPHAGOGASTRODUODENOSCOPY (EGD) WITH PROPOFOL N/A 06/25/2021   Procedure: ESOPHAGOGASTRODUODENOSCOPY (EGD) WITH PROPOFOL;  Surgeon: [name], MD;  Location: [place];  Service: Endoscopy;  Laterality: N/A;   POLYPECTOMY N/A 12/03/2019   Procedure: POLYPECTOMY;  Surgeon: [name], MD;  Location: [place];  Service: Endoscopy;  Laterality: N/A;   PTOSIS REPAIR Bilateral 07/12/2017   Procedure: ptosis repair resect ex;  Surgeon: Karle Starch, MD;  Location: [place];  Service: Ophthalmology;  Laterality: Bilateral;  Requests early   VAGINAL PROLAPSE REPAIR  2014   Dr. Enzo Bi    Current Outpatient Medications  Medication Sig Dispense Refill   aspirin EC 81 MG tablet Take 81 mg by mouth daily.     cholecalciferol (VITAMIN D) 1000 UNITS tablet Take 2,000 Units by mouth daily.  diphenhydramine-acetaminophen (TYLENOL PM) 25-500 MG TABS tablet Take 1 tablet by mouth at bedtime as needed.     glucosamine-chondroitin 500-400 MG tablet Take 1 tablet by mouth daily.      isosorbide mononitrate (IMDUR) 30 MG 24 hr tablet Take 1 tablet (30 mg total) by mouth daily. 90 tablet 3   levothyroxine (SYNTHROID) 50 MCG tablet TAKE 1 TABLET BY MOUTH  DAILY 90 tablet 3   mometasone (ELOCON) 0.1 % lotion SMARTSIG:2-4 Drop(s) In Ear(s) Every Night  PRN     Multiple Vitamins-Minerals (MULTIVITAMIN WITH MINERALS) tablet Take 1 tablet by mouth daily.     Omega-3 Fatty Acids (FISH OIL) 1000 MG CPDR Take by mouth daily.     pantoprazole (PROTONIX) 20 MG tablet TAKE 1 TABLET BY MOUTH EVERY DAY 90 tablet 1   rosuvastatin (CRESTOR) 10 MG tablet TAKE 1 TABLET BY MOUTH  DAILY 90 tablet 3   telmisartan (MICARDIS) 40 MG tablet Take 1 tablet (40 mg total) by mouth daily. In the morning  for blood pressure 90 tablet 1   traZODone (DESYREL) 50 MG tablet TAKE 1/2 TO 1 TABLET BY MOUTH AT BEDTIME AS NEEDED FOR SLEEP 90 tablet 2   diltiazem (CARDIZEM CD) 120 MG 24 hr capsule Take 1 capsule (120 mg total) by mouth daily. 90 capsule 3   No current facility-administered medications for this visit.   Allergies:   Prednisone   Social History:  The patient  reports that she has quit smoking. Her smoking use included cigarettes. She has never used smokeless tobacco. She reports that she does not drink alcohol and does not use drugs.   Family History:   family history includes Breast cancer in her mother and sister; Cancer in her brother, brother, brother, sister, and sister; Diabetes in her brother, brother, brother, father, and mother; Heart attack in her brother and mother; Pancreatic cancer in her sister; Stroke in her father.   Review of Systems: Review of Systems  Constitutional: Negative.   HENT: Negative.    Respiratory: Negative.    Cardiovascular: Negative.   Gastrointestinal: Negative.   Musculoskeletal: Negative.   Neurological: Negative.   Psychiatric/Behavioral: Negative.    All other systems reviewed and are negative.  PHYSICAL EXAM: VS:  BP (!) 164/70 (BP Location: Left Arm, Patient Position: Sitting, Cuff Size: Normal)    Pulse (!) 58    Ht _0  (1.727 m)    Wt 136 lb 8 oz (61.9 kg)    SpO2 98%    BMI 20.75 kg/m  , BMI Body mass index is 20.75 kg/m. Constitutional:  oriented to person, place, and time. No distress.  HENT:  Head:  Grossly normal Eyes:  no discharge. No scleral icterus.  Neck: No JVD, no carotid bruits  Cardiovascular: Regular rate and rhythm, no murmurs appreciated Pulmonary/Chest: Clear to auscultation bilaterally, no wheezes or rails Abdominal: Soft.  no distension.  no tenderness.  Musculoskeletal: Normal range of motion Neurological:  normal muscle tone. Coordination normal. No atrophy Skin: Skin warm and dry Psychiatric: normal affect, pleasant   Recent Labs: 09/25/2021: ALT 24; BUN 17; Creatinine, Ser 0.92; Potassium 4.9; Sodium 142; TSH 3.21    Lipid Panel Lab Results  Component Value Date   CHOL 205 (H) 09/25/2021   HDL 65.40 09/25/2021   LDLCALC 115 (H) 09/25/2021   TRIG 121.0 09/25/2021      Wt Readings from Last 3 Encounters:  11/25/21 136 lb 8 oz (61.9 kg)  11/02/21 134 lb (60.8 kg)  09/25/21 134 lb 6.4 oz (61 kg)       ASSESSMENT AND PLAN:  Problem List Items Addressed This Visit       Cardiology Problems   Essential hypertension   Relevant Medications   isosorbide mononitrate (IMDUR) 30 MG 24 hr tablet   diltiazem (CARDIZEM CD) 120 MG 24 hr capsule   Other Relevant Orders   EKG 12-Lead   Abdominal aortic atherosclerosis (HCC) - Primary   Relevant Medications   isosorbide mononitrate (IMDUR) 30 MG 24 hr tablet   diltiazem (CARDIZEM CD) 120 MG 24 hr capsule   Other Visit Diagnoses     PVC (premature ventricular contraction)       Relevant Medications   isosorbide mononitrate (IMDUR) 30 MG 24 hr tablet   diltiazem (CARDIZEM CD) 120 MG 24 hr capsule   Mixed hyperlipidemia       Relevant Medications   isosorbide mononitrate (IMDUR) 30 MG 24 hr tablet   diltiazem (CARDIZEM CD) 120 MG 24 hr capsule   Mediastinal mass         Mediastinal mass/lymph node 2 cm in size, no change over serial CT scans Felt to be benign finding  Chest pain Less likely angina though concern for coronary spasm or esophageal spasm Seems to have improved in the past week or so  but concerned about it coming back Suggested she could try isosorbide 15 up to 30 mg daily This would also assist in her blood pressure  Essential hypertension Continue current outpatient medications, add isosorbide 15 up to 30 mg daily  Palpitations/PVCs asymptomatic, noted on EKG today Continue diltiazem  Hyperlipidemia Tolerating Crestor 10 daily Prior cardiac CTA no coronary calcification  Adjustment disorder Stress reduction techniques discussed High stress taking care of husband    Total encounter time more than 25 minutes  Greater than 50% was spent in counseling and coordination of care with the patient    Signed, Esmond Plants, M.D., Ph.D. [place], [place]

## 2021-11-25 NOTE — Patient Instructions (Addendum)
Medication Instructions:  Please START isosorbide 30 mg daily Ok to start with a 1/2 pill, then move to whole tab  If you need a refill on your cardiac medications before your next appointment, please call your pharmacy.   Lab work: No new labs needed  Testing/Procedures: No new testing needed  Follow-Up: At [facility], you and your health needs are our priority.  As part of our continuing mission to provide you with exceptional heart care, we have created designated Provider Care Teams.  These Care Teams include your primary Cardiologist (physician) and Advanced Practice Providers (APPs -  Physician Assistants and Nurse Practitioners) who all work together to provide you with the care you need, when you need it.  You will need a follow up appointment in 12 months  Providers on your designated Care Team:   Murray Hodgkins, NP Christell Faith, PA-C Cadence Kathlen Mody, [place]  COVID-19 Vaccine Information can be found at: [email or web address] For questions related to vaccine distribution or appointments, please email [email or web address] or call [phone].

## 2021-12-02 DIAGNOSIS — D2372 Other benign neoplasm of skin of left lower limb, including hip: Secondary | ICD-10-CM | POA: Diagnosis not present

## 2021-12-02 DIAGNOSIS — M79672 Pain in left foot: Secondary | ICD-10-CM | POA: Diagnosis not present

## 2021-12-02 DIAGNOSIS — G629 Polyneuropathy, unspecified: Secondary | ICD-10-CM | POA: Diagnosis not present

## 2021-12-02 DIAGNOSIS — M2042 Other hammer toe(s) (acquired), left foot: Secondary | ICD-10-CM | POA: Diagnosis not present

## 2021-12-02 DIAGNOSIS — Q6672 Congenital pes cavus, left foot: Secondary | ICD-10-CM | POA: Diagnosis not present

## 2021-12-02 DIAGNOSIS — Q828 Other specified congenital malformations of skin: Secondary | ICD-10-CM | POA: Diagnosis not present

## 2021-12-02 DIAGNOSIS — M216X1 Other acquired deformities of right foot: Secondary | ICD-10-CM | POA: Diagnosis not present

## 2021-12-02 DIAGNOSIS — Q6671 Congenital pes cavus, right foot: Secondary | ICD-10-CM | POA: Diagnosis not present

## 2021-12-02 DIAGNOSIS — M216X2 Other acquired deformities of left foot: Secondary | ICD-10-CM | POA: Diagnosis not present

## 2021-12-02 DIAGNOSIS — M2041 Other hammer toe(s) (acquired), right foot: Secondary | ICD-10-CM | POA: Diagnosis not present

## 2021-12-03 ENCOUNTER — Other Ambulatory Visit: Payer: Self-pay

## 2021-12-03 ENCOUNTER — Ambulatory Visit: Payer: Medicare Other | Admitting: Dermatology

## 2021-12-03 ENCOUNTER — Encounter: Payer: Self-pay | Admitting: Dermatology

## 2021-12-03 DIAGNOSIS — D1800 Hemangioma unspecified site: Secondary | ICD-10-CM

## 2021-12-03 DIAGNOSIS — Z1283 Encounter for screening for malignant neoplasm of skin: Secondary | ICD-10-CM | POA: Diagnosis not present

## 2021-12-03 DIAGNOSIS — Z86018 Personal history of other benign neoplasm: Secondary | ICD-10-CM

## 2021-12-03 DIAGNOSIS — L814 Other melanin hyperpigmentation: Secondary | ICD-10-CM | POA: Diagnosis not present

## 2021-12-03 DIAGNOSIS — L821 Other seborrheic keratosis: Secondary | ICD-10-CM | POA: Diagnosis not present

## 2021-12-03 DIAGNOSIS — C44519 Basal cell carcinoma of skin of other part of trunk: Secondary | ICD-10-CM | POA: Diagnosis not present

## 2021-12-03 DIAGNOSIS — D229 Melanocytic nevi, unspecified: Secondary | ICD-10-CM

## 2021-12-03 DIAGNOSIS — L578 Other skin changes due to chronic exposure to nonionizing radiation: Secondary | ICD-10-CM | POA: Diagnosis not present

## 2021-12-03 DIAGNOSIS — D2262 Melanocytic nevi of left upper limb, including shoulder: Secondary | ICD-10-CM

## 2021-12-03 DIAGNOSIS — D492 Neoplasm of unspecified behavior of bone, soft tissue, and skin: Secondary | ICD-10-CM

## 2021-12-03 DIAGNOSIS — Z85828 Personal history of other malignant neoplasm of skin: Secondary | ICD-10-CM | POA: Diagnosis not present

## 2021-12-03 NOTE — Patient Instructions (Addendum)
Melanoma ABCDEs  Melanoma is the most dangerous type of skin cancer, and is the leading cause of death from skin disease.  You are more likely to develop melanoma if you: Have light-colored skin, light-colored eyes, or red or blond hair Spend a lot of time in the sun Tan regularly, either outdoors or in a tanning bed Have had blistering sunburns, especially during childhood Have a close family member who has had a melanoma Have atypical moles or large birthmarks  Early detection of melanoma is key since treatment is typically straightforward and cure rates are extremely high if we catch it early.   The first sign of melanoma is often a change in a mole or a new dark spot.  The ABCDE system is a way of remembering the signs of melanoma.  A for asymmetry:  The two halves do not match. B for border:  The edges of the growth are irregular. C for color:  A mixture of colors are present instead of an even brown color. D for diameter:  Melanomas are usually (but not always) greater than 58mm - the size of a pencil eraser. E for evolution:  The spot keeps changing in size, shape, and color.  Please check your skin once per month between visits. You can use a small mirror in front and a large mirror behind you to keep an eye on the back side or your body.   If you see any new or changing lesions before your next follow-up, please call to schedule a visit.  Please continue daily skin protection including broad spectrum sunscreen SPF 30+ to sun-exposed areas, reapplying every 2 hours as needed when you're outdoors.   Staying in the shade or wearing long sleeves, sun glasses (UVA+UVB protection) and wide brim hats (4-inch brim around the entire circumference of the hat) are also recommended for sun protection.    Wound Care Instructions  Cleanse wound gently with soap and water once a day then pat dry with clean gauze. Apply a thing coat of Petrolatum (petroleum jelly, "Vaseline") over the wound  (unless you have an allergy to this). We recommend that you use a new, sterile tube of Vaseline. Do not pick or remove scabs. Do not remove the yellow or white "healing tissue" from the base of the wound.  Cover the wound with fresh, clean, nonstick gauze and secure with paper tape. You may use Band-Aids in place of gauze and tape if the would is small enough, but would recommend trimming much of the tape off as there is often too much. Sometimes Band-Aids can irritate the skin.  You should call the office for your biopsy report after 1 week if you have not already been contacted.  If you experience any problems, such as abnormal amounts of bleeding, swelling, significant bruising, significant pain, or evidence of infection, please call the office immediately.  FOR ADULT SURGERY PATIENTS: If you need something for pain relief you may take 1 extra strength Tylenol (acetaminophen) AND 2 Ibuprofen (200mg  each) together every 4 hours as needed for pain. (do not take these if you are allergic to them or if you have a reason you should not take them.) Typically, you may only need pain medication for 1 to 3 days.   Recommend taking Heliocare sun protection supplement daily in sunny weather for additional sun protection. For maximum protection on the sunniest days, you can take up to 2 capsules of regular Heliocare OR take 1 capsule of Heliocare Ultra. For prolonged  exposure (such as a full day in the sun), you can repeat your dose of the supplement 4 hours after your first dose. Heliocare can be purchased at [facility] or at [email or web address].    If You Need Anything After Your Visit  If you have any questions or concerns for your doctor, please call our main line at [phone] and press option 4 to reach your doctor's medical assistant. If no one answers, please leave a voicemail as directed and we will return your call as soon as possible. Messages left after 4 pm will be answered the following  business day.   You may also send [place] a message via [place]. We typically respond to MyChart messages within 1-2 business days.  For prescription refills, please ask your pharmacy to contact our office. Our fax number is [phone].  If you have an urgent issue when the clinic is closed that cannot wait until the next business day, you can page your doctor at the number below.    Please note that while we do our best to be available for urgent issues outside of office hours, we are not available 24/7.   If you have an urgent issue and are unable to reach [place], you may choose to seek medical care at your doctor's office, retail clinic, urgent care center, or emergency room.  If you have a medical emergency, please immediately call 911 or go to the emergency department.  Pager Numbers  - Dr. Nehemiah Massed: [phone]  - Dr. Laurence Ferrari: [phone]  - Dr. Nicole Kindred: [phone]  In the event of inclement weather, please call our main line at [phone] for an update on the status of any delays or closures.  Dermatology Medication Tips: Please keep the boxes that topical medications come in in order to help keep track of the instructions about where and how to use these. Pharmacies typically print the medication instructions only on the boxes and not directly on the medication tubes.   If your medication is too expensive, please contact our office at [phone] option 4 or send [place] a message through [place].   We are unable to tell what your co-pay for medications will be in advance as this is different depending on your insurance coverage. However, we may be able to find a substitute medication at lower cost or fill out paperwork to get insurance to cover a needed medication.   If a prior authorization is required to get your medication covered by your insurance company, please allow [place] 1-2 business days to complete this process.  Drug prices often vary depending on where the prescription is  filled and some pharmacies may offer cheaper prices.  The website www.goodrx.com contains coupons for medications through different pharmacies. The prices here do not account for what the cost may be with help from insurance (it may be cheaper with your insurance), but the website can give you the price if you did not use any insurance.  - You can print the associated coupon and take it with your prescription to the pharmacy.  - You may also stop by our office during regular business hours and pick up a GoodRx coupon card.  - If you need your prescription sent electronically to a different pharmacy, notify our office through [facility] or by phone at [phone] option 4.     Si Usted Necesita Algo Despus de Su Visita  Tambin puede enviarnos un mensaje a travs de Pharmacist, community. Por lo general respondemos a los mensajes de  MyChart en el transcurso de 1 a 2 das hbiles.  Para renovar recetas, por favor pida a su farmacia que se ponga en contacto con nuestra oficina. Harland Dingwall de fax es [place] [phone].  Si tiene un asunto urgente cuando la clnica est cerrada y que no puede esperar hasta el siguiente da hbil, puede llamar/localizar a su doctor(a) al nmero que aparece a continuacin.   Por favor, tenga en cuenta que aunque hacemos todo lo posible para estar disponibles para asuntos urgentes fuera del horario de [place], no estamos disponibles las 24 horas del da, los 7 das de la [place].   Si tiene un problema urgente y no puede comunicarse con nosotros, puede optar por buscar atencin mdica  en el consultorio de su doctor(a), en una clnica privada, en un centro de atencin urgente o en una sala de emergencias.  Si tiene Engineering geologist, por favor llame inmediatamente al 911 o vaya a la sala de emergencias.  Nmeros de bper  - Dr. Nehemiah Massed: [phone]  - Dra. Moye: [phone]  - Dra. Nicole Kindred: [phone]  En caso de inclemencias del [place], por favor llame  a Johnsie Kindred principal al [phone] para una actualizacin sobre el [place] de cualquier retraso o cierre.  Consejos para la medicacin en dermatologa: Por favor, guarde las cajas en las que vienen los medicamentos de uso tpico para ayudarle a seguir las instrucciones sobre dnde y cmo usarlos. Las farmacias generalmente imprimen las instrucciones del medicamento slo en las cajas y no directamente en los tubos del [place].   Si su medicamento es muy caro, por favor, pngase en contacto con Zigmund Daniel llamando al [phone] y presione la opcin 4 o envenos un mensaje a travs de Pharmacist, community.   No podemos decirle cul ser su copago por los medicamentos por adelantado ya que esto es diferente dependiendo de la cobertura de su seguro. Sin embargo, es posible que podamos encontrar un medicamento sustituto a Electrical engineer un formulario para que el seguro cubra el medicamento que se considera necesario.   Si se requiere una autorizacin previa para que su compaa de seguros [place] su medicamento, por favor permtanos de 1 a 2 das hbiles para completar este proceso.  Los precios de los medicamentos varan con frecuencia dependiendo del Environmental consultant de dnde se surte la receta y alguna farmacias pueden ofrecer precios ms baratos.  El sitio web www.goodrx.com tiene cupones para medicamentos de Airline pilot. Los precios aqu no tienen en cuenta lo que podra costar con la ayuda del seguro (puede ser ms barato con su seguro), pero el sitio web puede darle el precio si no utiliz Research scientist (physical sciences).  - Puede imprimir el cupn correspondiente y llevarlo con su receta a la farmacia.  - Tambin puede pasar por nuestra oficina durante el horario de atencin regular y Charity fundraiser una tarjeta de cupones de GoodRx.  - Si necesita que su receta se enve electrnicamente a una farmacia diferente, informe a nuestra oficina a travs de MyChart de [place] o por telfono llamando al [phone] y  presione la opcin 4.

## 2021-12-03 NOTE — Progress Notes (Signed)
Follow-Up Visit   Subjective  [name] is a 81 y.o. female who presents for the following: Annual Exam (Hx of DN with mild atypia. Hx of BCC's. No particular areas of concern today, per patient. Surgical procedure on left foot for callus yesterday.).  The following portions of the chart were reviewed this encounter and updated as appropriate:  Tobacco   Allergies   Meds   Problems   Med Hx   Surg Hx   Fam Hx       Review of Systems: No other skin or systemic complaints except as noted in HPI or Assessment and Plan.   Objective  Well appearing patient in no apparent distress; mood and affect are within normal limits.  A full examination was performed including scalp, head, eyes, ears, nose, lips, neck, chest, axillae, abdomen, back, buttocks, bilateral upper extremities, bilateral lower extremities, hands, feet, fingers, toes, fingernails, and toenails. All findings within normal limits unless otherwise noted below.  Left Chest 0.45cm speckled dark brown and skin colored thin papule.        Left Upper Arm - Anterior 0.2cm light brown-dark brown papule.         Assessment & Plan  Neoplasm of skin (2) Left Chest  Epidermal / dermal shaving  Lesion diameter (cm):  0.5 Informed consent: discussed and consent obtained   Patient was prepped and draped in usual sterile fashion: Area prepped with alcohol. Anesthesia: the lesion was anesthetized in a standard fashion   Anesthetic:  1% lidocaine w/ epinephrine 1-100,000 buffered w/ 8.4% NaHCO3 Instrument used: flexible razor blade   Hemostasis achieved with: pressure, aluminum chloride and electrodesiccation   Outcome: patient tolerated procedure well   Post-procedure details: wound care instructions given   Post-procedure details comment:  Ointment and small bandage applied  Specimen 1 - Surgical pathology Differential Diagnosis: R/O atypia vs BCC  Check Margins: No  Left Upper Arm - Anterior  Epidermal /  dermal shaving  Lesion diameter (cm):  0.2 Informed consent: discussed and consent obtained   Patient was prepped and draped in usual sterile fashion: Area prepped with alcohol. Anesthesia: the lesion was anesthetized in a standard fashion   Anesthetic:  1% lidocaine w/ epinephrine 1-100,000 buffered w/ 8.4% NaHCO3 Instrument used: flexible razor blade   Hemostasis achieved with: pressure, aluminum chloride and electrodesiccation   Outcome: patient tolerated procedure well   Post-procedure details: wound care instructions given   Post-procedure details comment:  Ointment and small bandage applied  Specimen 2 - Surgical pathology Differential Diagnosis: R/O atypia  Check Margins: No   Lentigines - Scattered tan macules - Due to sun exposure - Benign-appearing, observe - Recommend daily broad spectrum sunscreen SPF 30+ to sun-exposed areas, reapply every 2 hours as needed. - Call for any changes  Seborrheic Keratoses - Stuck-on, waxy, tan-brown papules and/or plaques  - Benign-appearing - Discussed benign etiology and prognosis. - Observe - Call for any changes  Melanocytic Nevi - Tan-brown and/or pink-flesh-colored symmetric macules and papules - Benign appearing on exam today - Observation - Call clinic for new or changing moles - Recommend daily use of broad spectrum spf 30+ sunscreen to sun-exposed areas.   Hemangiomas - Red papules - Discussed benign nature - Observe - Call for any changes  Actinic Damage - Chronic condition, secondary to cumulative UV/sun exposure - diffuse scaly erythematous macules with underlying dyspigmentation - Recommend daily broad spectrum sunscreen SPF 30+ to sun-exposed areas, reapply every 2 hours as needed.  -  Staying in the shade or wearing long sleeves, sun glasses (UVA+UVB protection) and wide brim hats (4-inch brim around the entire circumference of the hat) are also recommended for sun protection.  - Call for new or changing  lesions.  Skin cancer screening performed today.  History of Basal Cell Carcinoma of the Skin - No evidence of recurrence today at right lateral ankle, right pretibia, left forehead - Recommend regular full body skin exams - Recommend daily broad spectrum sunscreen SPF 30+ to sun-exposed areas, reapply every 2 hours as needed.  - Call if any new or changing lesions are noted between office visits  History of Dysplastic Nevus - No evidence of recurrence today at right dorsal hand - Recommend regular full body skin exams - Recommend daily broad spectrum sunscreen SPF 30+ to sun-exposed areas, reapply every 2 hours as needed.  - Call if any new or changing lesions are noted between office visits   Return for TBSE 6-12 months.  I, Emelia Salisbury, CMA, am acting as scribe for Forest Gleason, MD.   Documentation: I have reviewed the above documentation for accuracy and completeness, and I agree with the above.  Forest Gleason, MD

## 2021-12-09 ENCOUNTER — Telehealth: Payer: Self-pay

## 2021-12-09 NOTE — Telephone Encounter (Signed)
Patient advised of BX results and scheduled. aw

## 2021-12-09 NOTE — Telephone Encounter (Signed)
-----   Message from Alfonso Patten, MD sent at 12/09/2021 12:06 PM EST ----- Diagnosis 1. Skin , left chest BASAL CELL CARCINOMA, NODULAR PATTERN, PIGMENTED, CLOSE TO MARGIN --> ED&C  2. Skin , left upper arm-anterior MELANOCYTIC NEVUS, INTRADERMAL TYPE, BASE INVOLVED  This is a NORMAL MOLE. No additional treatment is needed. If you notice any new or changing spots or have other skin concerns in future, please call our office at [phone].     MAs please call to review results and schedule for [facility]. Thank you!

## 2021-12-09 NOTE — Telephone Encounter (Signed)
Left pt message with daughter to have pt call the office for bx result./sh

## 2021-12-13 ENCOUNTER — Encounter: Payer: Self-pay | Admitting: Dermatology

## 2021-12-23 DIAGNOSIS — M2042 Other hammer toe(s) (acquired), left foot: Secondary | ICD-10-CM | POA: Diagnosis not present

## 2021-12-23 DIAGNOSIS — M2041 Other hammer toe(s) (acquired), right foot: Secondary | ICD-10-CM | POA: Diagnosis not present

## 2021-12-23 DIAGNOSIS — L84 Corns and callosities: Secondary | ICD-10-CM | POA: Diagnosis not present

## 2021-12-23 DIAGNOSIS — G629 Polyneuropathy, unspecified: Secondary | ICD-10-CM | POA: Diagnosis not present

## 2021-12-23 DIAGNOSIS — M216X2 Other acquired deformities of left foot: Secondary | ICD-10-CM | POA: Diagnosis not present

## 2021-12-23 DIAGNOSIS — M216X1 Other acquired deformities of right foot: Secondary | ICD-10-CM | POA: Diagnosis not present

## 2021-12-28 ENCOUNTER — Other Ambulatory Visit: Payer: Self-pay | Admitting: Family

## 2021-12-28 DIAGNOSIS — I493 Ventricular premature depolarization: Secondary | ICD-10-CM

## 2021-12-29 NOTE — Telephone Encounter (Signed)
Rx(s) sent to pharmacy electronically.  

## 2021-12-30 ENCOUNTER — Other Ambulatory Visit: Payer: Self-pay

## 2021-12-30 ENCOUNTER — Ambulatory Visit (INDEPENDENT_AMBULATORY_CARE_PROVIDER_SITE_OTHER): Payer: Medicare Other | Admitting: Internal Medicine

## 2021-12-30 ENCOUNTER — Encounter: Payer: Self-pay | Admitting: Internal Medicine

## 2021-12-30 VITALS — BP 140/88 | HR 69 | Temp 97.9°F | Ht 68.0 in | Wt 136.1 lb

## 2021-12-30 DIAGNOSIS — E039 Hypothyroidism, unspecified: Secondary | ICD-10-CM

## 2021-12-30 DIAGNOSIS — E7849 Other hyperlipidemia: Secondary | ICD-10-CM | POA: Diagnosis not present

## 2021-12-30 DIAGNOSIS — R202 Paresthesia of skin: Secondary | ICD-10-CM | POA: Diagnosis not present

## 2021-12-30 DIAGNOSIS — G629 Polyneuropathy, unspecified: Secondary | ICD-10-CM

## 2021-12-30 DIAGNOSIS — R221 Localized swelling, mass and lump, neck: Secondary | ICD-10-CM | POA: Diagnosis not present

## 2021-12-30 DIAGNOSIS — I10 Essential (primary) hypertension: Secondary | ICD-10-CM

## 2021-12-30 DIAGNOSIS — R200 Anesthesia of skin: Secondary | ICD-10-CM

## 2021-12-30 DIAGNOSIS — R0789 Other chest pain: Secondary | ICD-10-CM | POA: Diagnosis not present

## 2021-12-30 LAB — COMPREHENSIVE METABOLIC PANEL
ALT: 20 U/L (ref 0–35)
AST: 24 U/L (ref 0–37)
Albumin: 4.2 g/dL (ref 3.5–5.2)
Alkaline Phosphatase: 41 U/L (ref 39–117)
BUN: 13 mg/dL (ref 6–23)
CO2: 30 mEq/L (ref 19–32)
Calcium: 9.6 mg/dL (ref 8.4–10.5)
Chloride: 98 mEq/L (ref 96–112)
Creatinine, Ser: 0.93 mg/dL (ref 0.40–1.20)
GFR: 58.01 mL/min — ABNORMAL LOW (ref >60.00)
Glucose, Bld: 84 mg/dL (ref 70–99)
Potassium: 4.4 mEq/L (ref 3.5–5.1)
Sodium: 138 mEq/L (ref 135–145)
Total Bilirubin: 0.5 mg/dL (ref 0.2–1.2)
Total Protein: 7.1 g/dL (ref 6.0–8.3)

## 2021-12-30 LAB — LIPID PANEL
Cholesterol: 171 mg/dL (ref 0–200)
HDL: 60.1 mg/dL (ref >39.00)
LDL Cholesterol: 86 mg/dL (ref 0–99)
NonHDL: 111.26
Total CHOL/HDL Ratio: 3
Triglycerides: 125 mg/dL (ref 0.0–149.0)
VLDL: 25 mg/dL (ref 0.0–40.0)

## 2021-12-30 LAB — TSH: TSH: 3.24 u[IU]/mL (ref 0.35–5.50)

## 2021-12-30 LAB — HEMOGLOBIN A1C: Hgb A1c MFr Bld: 6 % (ref 4.6–6.5)

## 2021-12-30 LAB — VITAMIN B12: Vitamin B-12: 517 pg/mL (ref 211–911)

## 2021-12-30 NOTE — Assessment & Plan Note (Addendum)
She was sent back to cardiology for evaluation and prescribed  low dose imdur . She has had no subsequent episodes.

## 2021-12-30 NOTE — Assessment & Plan Note (Signed)
TSH is therapeutic,  Continue current 50 MCG  levothyoxine dose,  Recheck in 6 months   Lab Results  Component Value Date   TSH 3.24 12/30/2021

## 2021-12-30 NOTE — Progress Notes (Signed)
Subjective:  Patient ID: [name], female    DOB: 31-Aug-1941  Age: 81 y.o. MRN: [id number]  CC: The primary encounter diagnosis was Essential hypertension. Diagnoses of Familial hyperlipidemia, Numbness and tingling of both feet, Neuropathy, Atypical chest pain, Pulsatile neck mass, and Acquired hypothyroidism were also pertinent to this visit.   This visit occurred during the SARS-CoV-2 public health emergency.  Safety protocols were in place, including screening questions prior to the visit, additional usage of staff PPE, and extensive cleaning of exam room while observing appropriate contact time as indicated for disinfecting solutions.    HPI [name] presents for  Chief Complaint  Patient presents with   Follow-up    3 month follow up on hypertension. Pt's blood pressure medication was increased at last office visit.    1) HTN:  tolerating medication increase .  No light headedness,  home readings have been 810 to 175 systolic.   2) Chest pain :  saw cardiologist, Imdur started ,no subsequent episodes of chest pain  3) foot numbness:  reported to podiatrist; ball of foot feels crunchy and tingly at night.  Patient states that she was unable to microfilament along the ball of feet bilaterally but could feel it at the heel.. had a callous removed from the ball of the left foot.  Has been referred by podiatrist to [facility] for EMG/nerve conduction   4) pulsatile slightly tender mass over right carotid for the last 2-3 weeks at base of neck   Outpatient Medications Prior to Visit  Medication Sig Dispense Refill   aspirin EC 81 MG tablet Take 81 mg by mouth daily.     cholecalciferol (VITAMIN D) 1000 UNITS tablet Take 2,000 Units by mouth daily.      diltiazem (CARDIZEM CD) 120 MG 24 hr capsule TAKE 1 CAPSULE BY MOUTH  DAILY 90 capsule 2   diphenhydramine-acetaminophen (TYLENOL PM) 25-500 MG TABS tablet Take 1 tablet by mouth at bedtime as needed.      glucosamine-chondroitin 500-400 MG tablet Take 1 tablet by mouth daily.      isosorbide mononitrate (IMDUR) 30 MG 24 hr tablet Take 1 tablet (30 mg total) by mouth daily. 90 tablet 3   levothyroxine (SYNTHROID) 50 MCG tablet TAKE 1 TABLET BY MOUTH  DAILY 90 tablet 3   mometasone (ELOCON) 0.1 % lotion SMARTSIG:2-4 Drop(s) In Ear(s) Every Night PRN     Multiple Vitamins-Minerals (MULTIVITAMIN WITH MINERALS) tablet Take 1 tablet by mouth daily.     Omega-3 Fatty Acids (FISH OIL) 1000 MG CPDR Take by mouth daily.     pantoprazole (PROTONIX) 20 MG tablet TAKE 1 TABLET BY MOUTH EVERY DAY 90 tablet 1   rosuvastatin (CRESTOR) 10 MG tablet TAKE 1 TABLET BY MOUTH  DAILY 90 tablet 3   telmisartan (MICARDIS) 40 MG tablet Take 1 tablet (40 mg total) by mouth daily. In the morning  for blood pressure 90 tablet 1   traZODone (DESYREL) 50 MG tablet TAKE 1/2 TO 1 TABLET BY MOUTH AT BEDTIME AS NEEDED FOR SLEEP 90 tablet 2   No facility-administered medications prior to visit.    Review of Systems;  Patient denies headache, fevers, malaise, unintentional weight loss, skin rash, eye pain, sinus congestion and sinus pain, sore throat, dysphagia,  hemoptysis , cough, dyspnea, wheezing, chest pain, palpitations, orthopnea, edema, abdominal pain, nausea, melena, diarrhea, constipation, flank pain, dysuria, hematuria, urinary  Frequency, nocturia, numbness, tingling, seizures,  Focal weakness, Loss of consciousness,  Tremor, insomnia,  depression, anxiety, and suicidal ideation.      Objective:  BP 140/88 (BP Location: Left Arm, Patient Position: Sitting, Cuff Size: Normal)    Pulse 69    Temp 97.9 F (36.6 C) (Oral)    Ht _0  (1.727 m)    Wt 136 lb 1.9 oz (61.7 kg)    SpO2 97%    BMI 20.70 kg/m   BP Readings from Last 3 Encounters:  12/30/21 140/88  11/25/21 (!) 164/70  11/02/21 122/65    Wt Readings from Last 3 Encounters:  12/30/21 136 lb 1.9 oz (61.7 kg)  11/25/21 136 lb 8 oz (61.9 kg)  11/02/21 134 lb  (60.8 kg)    General appearance: alert, cooperative and appears stated age Ears: normal TM's and external ear canals both ears Throat: lips, mucosa, and tongue normal; teeth and gums normal Neck: no adenopathy, no carotid bruit, supple, symmetrical, trachea midline and thyroid not enlarged, symmetric, no tenderness/mass/nodules Back: symmetric, no curvature. ROM normal. No CVA tenderness. Lungs: clear to auscultation bilaterally Heart: regular rate and rhythm, S1, S2 normal, no murmur, click, rub or gallop Abdomen: soft, non-tender; bowel sounds normal; no masses,  no organomegaly Pulses: 2+ and symmetric Skin: Skin color, texture, turgor normal. No rashes or lesions Lymph nodes: Cervical, supraclavicular, and axillary nodes normal.  Lab Results  Component Value Date   HGBA1C 6.0 12/30/2021   HGBA1C 5.9 03/25/2021   HGBA1C 6.2 09/24/2020    Lab Results  Component Value Date   CREATININE 0.93 12/30/2021   CREATININE 0.92 09/25/2021   CREATININE 0.86 03/25/2021    Lab Results  Component Value Date   WBC 5.2 03/20/2020   HGB 12.7 03/20/2020   HCT 37.7 03/20/2020   PLT 242.0 03/20/2020   GLUCOSE 84 12/30/2021   CHOL 171 12/30/2021   TRIG 125.0 12/30/2021   HDL 60.10 12/30/2021   LDLDIRECT 85.0 03/06/2018   LDLCALC 86 12/30/2021   ALT 20 12/30/2021   AST 24 12/30/2021   NA 138 12/30/2021   K 4.4 12/30/2021   CL 98 12/30/2021   CREATININE 0.93 12/30/2021   BUN 13 12/30/2021   CO2 30 12/30/2021   TSH 3.24 12/30/2021   HGBA1C 6.0 12/30/2021   MICROALBUR <0.7 09/13/2019    No results found.  Assessment & Plan:   Problem List Items Addressed This Visit     Acquired hypothyroidism    TSH is therapeutic,  Continue current 50 MCG  levothyoxine dose,  Recheck in 6 months   Lab Results  Component Value Date   TSH 3.24 12/30/2021         Atypical chest pain    She was sent back to cardiology for evaluation and prescribed  low dose imdur . She has had no  subsequent episodes.       Essential hypertension - Primary    Improved with addition of Imdur (for chest pain) and increased telmisartan dose.  Home readings have been consistently < 403 systolic .  No changes today . Checking urine microalb/cr ratio      Relevant Orders   Comp Met (CMET) (Completed)   Urine Microalbumin w/creat. ratio   Familial hyperlipidemia   Relevant Orders   Lipid Profile (Completed)   Neuropathy    With failed monofilament test on the lateral sides of both feet per podiatry Screening today for deficiencies and progression of her known prediabetes.   Will start gabapentin if negative.  Sees [facility] in a few weeks for evaluation (referred by  podiatry)       Pulsatile neck mass    Unclear if exam suggests a small aneurysm or a small LN lying on top of carotid .  Dopplers ordered       Relevant Orders   US Carotid Duplex Bilateral   Other Visit Diagnoses     Numbness and tingling of both feet       Relevant Orders   Vitamin B12 (Completed)   RBC Folate   Hemoglobin A1c (Completed)   TSH (Completed)       I spent 40 minutes dedicated to the care of this patient on the date of this encounter to include pre-visit review of patient's medical history,  most recent imaging studies, Face-to-face time with the patient , and post visit ordering of testing and therapeutics.    Follow-up: Return in about 6 months (around 06/29/2022).   [name], MD

## 2021-12-30 NOTE — Assessment & Plan Note (Addendum)
With failed monofilament test on the lateral sides of both feet per podiatry Screening today for deficiencies and progression of her known prediabetes.   Will start gabapentin if negative.  Sees [facility] in a few weeks for evaluation (referred by podiatry)

## 2021-12-30 NOTE — Assessment & Plan Note (Signed)
Unclear if exam suggests a small aneurysm or a small LN lying on top of carotid .  Dopplers ordered

## 2021-12-30 NOTE — Patient Instructions (Signed)
Your neck is going to be evaluated with carotid dopplers.  The office (or Dr Donivan Scull office ) will call you with the appointment   Blood pressure is much better on current regimen.  Notify me or Dr Rockey Situ if you become 'woozy" with standing , or if your BP readings start to stay < 110   Let Dr Melrose Nakayama know about the labs we did today

## 2021-12-30 NOTE — Assessment & Plan Note (Addendum)
Improved with addition of Imdur (for chest pain) and increased telmisartan dose.  Home readings have been consistently < 174 systolic .  No changes today . Checking urine microalb/cr ratio

## 2021-12-31 LAB — MICROALBUMIN / CREATININE URINE RATIO
Creatinine,U: 29.3 mg/dL
Microalb Creat Ratio: 2.4 mg/g (ref 0.0–30.0)
Microalb, Ur: <0.7 mg/dL (ref 0.0–1.9)

## 2022-01-01 LAB — FOLATE RBC: RBC Folate: 735 ng/mL RBC (ref >280)

## 2022-01-06 ENCOUNTER — Other Ambulatory Visit: Payer: Self-pay

## 2022-01-06 ENCOUNTER — Encounter: Payer: Self-pay | Admitting: Dermatology

## 2022-01-06 ENCOUNTER — Ambulatory Visit: Payer: Medicare Other | Admitting: Dermatology

## 2022-01-06 DIAGNOSIS — C44519 Basal cell carcinoma of skin of other part of trunk: Secondary | ICD-10-CM

## 2022-01-06 NOTE — Progress Notes (Signed)
° °  Follow-Up Visit   Subjective  [name] is a 82 y.o. female who presents for the following: Procedure (Patient here today to treat bx proven BCC at left chest. ).  The following portions of the chart were reviewed this encounter and updated as appropriate:   Tobacco   Allergies   Meds   Problems   Med Hx   Surg Hx   Fam Hx       Review of Systems:  No other skin or systemic complaints except as noted in HPI or Assessment and Plan.  Objective  Well appearing patient in no apparent distress; mood and affect are within normal limits.  A focused examination was performed including chest. Relevant physical exam findings are noted in the Assessment and Plan.  left chest Pink papule    Assessment & Plan  Basal cell carcinoma (BCC) of skin of other part of torso left chest  Destruction of lesion  Destruction method: electrodesiccation and curettage   Informed consent: discussed and consent obtained   Timeout:  patient name, date of birth, surgical site, and procedure verified Patient was prepped and draped in usual sterile fashion: area prepped with isopropyl alcohol. Anesthesia: the lesion was anesthetized in a standard fashion   Anesthetic:  1% lidocaine w/ epinephrine 1-100,000 buffered w/ 8.4% NaHCO3 Curettage performed in three different directions: Yes   Electrodesiccation performed over the curetted area: Yes   Curettage cycles:  3 Final wound size (cm):  1 Hemostasis achieved with:  electrodesiccation Outcome: patient tolerated procedure well with no complications   Post-procedure details: wound care instructions given   Additional details:  Mupirocin and a pressure dressing applied   Return for TBSE 5 months.  Graciella Belton, RMA, am acting as scribe for Forest Gleason, MD .  Documentation: I have reviewed the above documentation for accuracy and completeness, and I agree with the above.  Forest Gleason, MD

## 2022-01-06 NOTE — Patient Instructions (Addendum)
Recommend Niacinamide or Nicotinamide 500mg  twice per day to lower risk of non-melanoma skin cancer by approximately 25%. This is usually available at [employer].    Wound Care Instructions  Cleanse wound gently with soap and water once a day then pat dry with clean gauze. Apply a thing coat of Petrolatum (petroleum jelly, "Vaseline") over the wound (unless you have an allergy to this). We recommend that you use a new, sterile tube of Vaseline. Do not pick or remove scabs. Do not remove the yellow or white "healing tissue" from the base of the wound.  Cover the wound with fresh, clean, nonstick gauze and secure with paper tape. You may use Band-Aids in place of gauze and tape if the would is small enough, but would recommend trimming much of the tape off as there is often too much. Sometimes Band-Aids can irritate the skin.  You should call the office for your biopsy report after 1 week if you have not already been contacted.  If you experience any problems, such as abnormal amounts of bleeding, swelling, significant bruising, significant pain, or evidence of infection, please call the office immediately.  FOR ADULT SURGERY PATIENTS: If you need something for pain relief you may take 1 extra strength Tylenol (acetaminophen) AND 2 Ibuprofen (200mg  each) together every 4 hours as needed for pain. (do not take these if you are allergic to them or if you have a reason you should not take them.) Typically, you may only need pain medication for 1 to 3 days.    If You Need Anything After Your Visit  If you have any questions or concerns for your doctor, please call our main line at [phone] and press option 4 to reach your doctor's medical assistant. If no one answers, please leave a voicemail as directed and we will return your call as soon as possible. Messages left after 4 pm will be answered the following business day.   You may also send [place] a message via [place]. We typically respond to  MyChart messages within 1-2 business days.  For prescription refills, please ask your pharmacy to contact our office. Our fax number is [phone].  If you have an urgent issue when the clinic is closed that cannot wait until the next business day, you can page your doctor at the number below.    Please note that while we do our best to be available for urgent issues outside of office hours, we are not available 24/7.   If you have an urgent issue and are unable to reach [place], you may choose to seek medical care at your doctor's office, retail clinic, urgent care center, or emergency room.  If you have a medical emergency, please immediately call 911 or go to the emergency department.  Pager Numbers  - Dr. Nehemiah Massed: [phone]  - Dr. Laurence Ferrari: [phone]  - Dr. Nicole Kindred: [phone]  In the event of inclement weather, please call our main line at [phone] for an update on the status of any delays or closures.  Dermatology Medication Tips: Please keep the boxes that topical medications come in in order to help keep track of the instructions about where and how to use these. Pharmacies typically print the medication instructions only on the boxes and not directly on the medication tubes.   If your medication is too expensive, please contact our office at [phone] option 4 or send [place] a message through [place].   We are unable to tell what your co-pay for medications  will be in advance as this is different depending on your insurance coverage. However, we may be able to find a substitute medication at lower cost or fill out paperwork to get insurance to cover a needed medication.   If a prior authorization is required to get your medication covered by your insurance company, please allow [place] 1-2 business days to complete this process.  Drug prices often vary depending on where the prescription is filled and some pharmacies may offer cheaper prices.  The website www.goodrx.com  contains coupons for medications through different pharmacies. The prices here do not account for what the cost may be with help from insurance (it may be cheaper with your insurance), but the website can give you the price if you did not use any insurance.  - You can print the associated coupon and take it with your prescription to the pharmacy.  - You may also stop by our office during regular business hours and pick up a GoodRx coupon card.  - If you need your prescription sent electronically to a different pharmacy, notify our office through [facility] or by phone at [phone] option 4.     Si Usted Necesita Algo Despus de Su Visita  Tambin puede enviarnos un mensaje a travs de Pharmacist, community. Por lo general respondemos a los mensajes de MyChart en el transcurso de 1 a 2 das hbiles.  Para renovar recetas, por favor pida a su farmacia que se ponga en contacto con nuestra oficina. Harland Dingwall de fax es [place] [phone].  Si tiene un asunto urgente cuando la clnica est cerrada y que no puede esperar hasta el siguiente da hbil, puede llamar/localizar a su doctor(a) al nmero que aparece a continuacin.   Por favor, tenga en cuenta que aunque hacemos todo lo posible para estar disponibles para asuntos urgentes fuera del horario de [place], no estamos disponibles las 24 horas del da, los 7 das de la [place].   Si tiene un problema urgente y no puede comunicarse con nosotros, puede optar por buscar atencin mdica  en el consultorio de su doctor(a), en una clnica privada, en un centro de atencin urgente o en una sala de emergencias.  Si tiene Engineering geologist, por favor llame inmediatamente al 911 o vaya a la sala de emergencias.  Nmeros de bper  - Dr. Nehemiah Massed: [phone]  - Dra. Moye: [phone]  - Dra. Nicole Kindred: [phone]  En caso de inclemencias del [place], por favor llame a Johnsie Kindred principal al [phone] para una actualizacin sobre el [place]  de cualquier retraso o cierre.  Consejos para la medicacin en dermatologa: Por favor, guarde las cajas en las que vienen los medicamentos de uso tpico para ayudarle a seguir las instrucciones sobre dnde y cmo usarlos. Las farmacias generalmente imprimen las instrucciones del medicamento slo en las cajas y no directamente en los tubos del [place].   Si su medicamento es muy caro, por favor, pngase en contacto con Zigmund Daniel llamando al [phone] y presione la opcin 4 o envenos un mensaje a travs de Pharmacist, community.   No podemos decirle cul ser su copago por los medicamentos por adelantado ya que esto es diferente dependiendo de la cobertura de su seguro. Sin embargo, es posible que podamos encontrar un medicamento sustituto a Electrical engineer un formulario para que el seguro cubra el medicamento que se considera necesario.   Si se requiere una autorizacin previa para que su compaa de seguros [place] su medicamento, por favor permtanos de  1 a 2 das hbiles para completar este proceso.  Los precios de los medicamentos varan con frecuencia dependiendo del Environmental consultant de dnde se surte la receta y alguna farmacias pueden ofrecer precios ms baratos.  El sitio web www.goodrx.com tiene cupones para medicamentos de Airline pilot. Los precios aqu no tienen en cuenta lo que podra costar con la ayuda del seguro (puede ser ms barato con su seguro), pero el sitio web puede darle el precio si no utiliz Research scientist (physical sciences).  - Puede imprimir el cupn correspondiente y llevarlo con su receta a la farmacia.  - Tambin puede pasar por nuestra oficina durante el horario de atencin regular y Charity fundraiser una tarjeta de cupones de GoodRx.  - Si necesita que su receta se enve electrnicamente a una farmacia diferente, informe a nuestra oficina a travs de MyChart de [place] o por telfono llamando al [phone] y presione la opcin 4.

## 2022-01-11 ENCOUNTER — Other Ambulatory Visit: Payer: Self-pay

## 2022-01-11 ENCOUNTER — Telehealth: Payer: Self-pay | Admitting: Cardiovascular Disease

## 2022-01-11 ENCOUNTER — Telehealth: Payer: Self-pay | Admitting: Internal Medicine

## 2022-01-11 DIAGNOSIS — R202 Paresthesia of skin: Secondary | ICD-10-CM | POA: Diagnosis not present

## 2022-01-11 DIAGNOSIS — M79672 Pain in left foot: Secondary | ICD-10-CM | POA: Diagnosis not present

## 2022-01-11 DIAGNOSIS — R221 Localized swelling, mass and lump, neck: Secondary | ICD-10-CM

## 2022-01-11 DIAGNOSIS — R200 Anesthesia of skin: Secondary | ICD-10-CM | POA: Diagnosis not present

## 2022-01-11 DIAGNOSIS — M79671 Pain in right foot: Secondary | ICD-10-CM | POA: Diagnosis not present

## 2022-01-11 NOTE — Telephone Encounter (Signed)
Patient daughter calling in and states they have not heard from Cardiology. States she called cardiology and they stated the order must be wrong as they do not do the testing ordered at Dr Donivan Scull office.   Patient's daughter wanting a call back when the order is corrected.   Please advise

## 2022-01-11 NOTE — Telephone Encounter (Signed)
Patient daughter calling States that Dr Derrel Nip wanted patient to have a carotid  Order in system is incorrect Would like to know if Dr Rockey Situ can order  Patient will also check again with Dr Demetrios Isaacs office about order

## 2022-01-11 NOTE — Telephone Encounter (Signed)
Spoke with pt's daughter and informed her that the order was corrected by Cardiology and appeared to have been scheduled for today at 3:00 pm. Daughter stated that she was unaware of any appt for today. Daughter was advised that she give Cardiology a call back in the morning.

## 2022-01-13 ENCOUNTER — Ambulatory Visit (INDEPENDENT_AMBULATORY_CARE_PROVIDER_SITE_OTHER): Payer: Medicare Other

## 2022-01-13 ENCOUNTER — Other Ambulatory Visit: Payer: Self-pay

## 2022-01-13 DIAGNOSIS — R221 Localized swelling, mass and lump, neck: Secondary | ICD-10-CM | POA: Diagnosis not present

## 2022-01-14 ENCOUNTER — Encounter: Payer: Self-pay | Admitting: Dermatology

## 2022-01-14 NOTE — Assessment & Plan Note (Signed)
Carotid ultrasound was normal; no aneurysm

## 2022-02-01 ENCOUNTER — Telehealth: Payer: Self-pay | Admitting: Dermatology

## 2022-02-01 NOTE — Telephone Encounter (Signed)
Please call Dr. Lupita Dawn office (PCP) and see if Dr. Derrel Nip has concerns about [place] recommending niacinamide/nicotinamide 500 mg bid for Ms. Stickle to lower her risk of making additional non-melanoma skin cancers? I do see she has a history of prediabetes. She has had 4 BCCs in the last few years, and nicotinamide lowers the risk of making additional NMSC by about 25%. ? ?If Dr. Derrel Nip thinks it is reasonable to add, please let the patient know and also advise that we continue to keep an eye on her blood sugars due to a possible risk of increasing blood sugar with the niacinamide.  ? ? ?Thank you! ? ?

## 2022-02-01 NOTE — Telephone Encounter (Signed)
Message printed and faxed to Dr. Lupita Dawn office. Awaiting their response.  ?

## 2022-02-08 ENCOUNTER — Telehealth: Payer: Self-pay

## 2022-02-08 NOTE — Telephone Encounter (Signed)
Advised patient that Dr. Derrel Nip states it is OK for her to use the Niacinamide '500mg'$  po BID. Patient states that she started using it after her last visit with Dr. Laurence Ferrari and hasn't had any side effects. Advised patient to keep an eye on her blood sugars due to a possible risk of increasing blood sugar with the niacinamide. Patient states the only time she checks her blood sugars is at her PCP's office Q3M. Discussed with patient if she has any s/e like light headedness or dizziness to stop Niacinamide and contact PCP.  ?

## 2022-02-24 ENCOUNTER — Other Ambulatory Visit: Payer: Self-pay | Admitting: Internal Medicine

## 2022-03-01 ENCOUNTER — Other Ambulatory Visit: Payer: Self-pay | Admitting: Internal Medicine

## 2022-03-01 DIAGNOSIS — Z1231 Encounter for screening mammogram for malignant neoplasm of breast: Secondary | ICD-10-CM

## 2022-03-10 ENCOUNTER — Telehealth: Payer: Self-pay | Admitting: Internal Medicine

## 2022-03-10 DIAGNOSIS — R300 Dysuria: Secondary | ICD-10-CM

## 2022-03-10 NOTE — Telephone Encounter (Signed)
LMTCB. Need to schedule pt for a lab appt so she can give [place] a urine specimen. Please schedule when pt returns call.  ?

## 2022-03-10 NOTE — Telephone Encounter (Signed)
noted 

## 2022-03-10 NOTE — Telephone Encounter (Signed)
Is this okay?

## 2022-03-10 NOTE — Telephone Encounter (Signed)
Patient scheduled for lab at 10:15 tomorrow 03/11/22. ?

## 2022-03-10 NOTE — Telephone Encounter (Signed)
Pt called stating she thinks she has a UTI and would like to give a urine specimen. The pt stated the provider said she did not need an appointment since she has them frequently ?

## 2022-03-11 ENCOUNTER — Other Ambulatory Visit (INDEPENDENT_AMBULATORY_CARE_PROVIDER_SITE_OTHER): Payer: Medicare Other

## 2022-03-11 DIAGNOSIS — R300 Dysuria: Secondary | ICD-10-CM

## 2022-03-11 LAB — URINALYSIS, ROUTINE W REFLEX MICROSCOPIC
Bilirubin Urine: NEGATIVE
Hgb urine dipstick: NEGATIVE
Ketones, ur: NEGATIVE
Nitrite: NEGATIVE
RBC / HPF: NONE SEEN (ref 0–?)
Specific Gravity, Urine: <=1.005 — AB (ref 1.000–1.030)
Total Protein, Urine: NEGATIVE
Urine Glucose: NEGATIVE
Urobilinogen, UA: 0.2 (ref 0.0–1.0)
pH: 7 (ref 5.0–8.0)

## 2022-03-12 LAB — URINE CULTURE
MICRO NUMBER:: 13290457
Result:: NO GROWTH
SPECIMEN QUALITY:: ADEQUATE

## 2022-03-13 ENCOUNTER — Encounter: Payer: Self-pay | Admitting: Internal Medicine

## 2022-03-14 ENCOUNTER — Other Ambulatory Visit: Payer: Self-pay | Admitting: Cardiovascular Disease

## 2022-03-14 DIAGNOSIS — K449 Diaphragmatic hernia without obstruction or gangrene: Secondary | ICD-10-CM

## 2022-03-15 ENCOUNTER — Other Ambulatory Visit: Payer: Self-pay

## 2022-03-15 DIAGNOSIS — R319 Hematuria, unspecified: Secondary | ICD-10-CM

## 2022-04-01 ENCOUNTER — Other Ambulatory Visit: Payer: Self-pay

## 2022-04-01 DIAGNOSIS — R319 Hematuria, unspecified: Secondary | ICD-10-CM

## 2022-04-02 ENCOUNTER — Encounter: Payer: Self-pay | Admitting: Urology

## 2022-04-02 ENCOUNTER — Other Ambulatory Visit
Admission: RE | Admit: 2022-04-02 | Discharge: 2022-04-02 | Disposition: A | Discharge status: Home / Self Care | Payer: Medicare Other | Attending: Urology | Admitting: Urology

## 2022-04-02 ENCOUNTER — Ambulatory Visit: Payer: Medicare Other | Admitting: Urology

## 2022-04-02 VITALS — BP 144/74 | HR 76 | Ht 67.0 in | Wt 136.0 lb

## 2022-04-02 DIAGNOSIS — R319 Hematuria, unspecified: Secondary | ICD-10-CM | POA: Diagnosis not present

## 2022-04-02 DIAGNOSIS — R300 Dysuria: Secondary | ICD-10-CM | POA: Diagnosis not present

## 2022-04-02 LAB — URINALYSIS, COMPLETE (UACMP) WITH MICROSCOPIC
Bilirubin Urine: NEGATIVE
Glucose, UA: NEGATIVE mg/dL
Hgb urine dipstick: NEGATIVE
Ketones, ur: NEGATIVE mg/dL
Leukocytes,Ua: NEGATIVE
Nitrite: NEGATIVE
Protein, ur: NEGATIVE mg/dL
Specific Gravity, Urine: 1.01 (ref 1.005–1.030)
pH: 5.5 (ref 5.0–8.0)

## 2022-04-02 NOTE — Progress Notes (Signed)
? ?04/02/2022 ?2:47 PM  ? ?[name] ?12-02-1940 ?[id number] ? ?Referring provider:  ?[name], MD ?[address] ?[address] ?[place],  [place] [address] ?Chief Complaint  ?Patient presents with  ? New Patient (Initial Visit)  ? Hematuria  ? ? ?HPI: ?[name] is a 81 y.o.female who presents today for further evaluation of hematuria.  ? ?She reached out to her PCP, Dr Derrel Nip, on 03/10/2022 with concern of possible UTI. UA  showed moderate leukocytes, 3-6 WBCs, and rare renal epithelial cells. Urine culture showed no growth.  ? ?She denies gross hematuria. She reports that she has UTIs . The symptoms she attributes to UTIs are chills, pressure, burning, and discomfort.  This being said, she also reports that every time that she is checked for urinary tract infection, the urinalysis and culture is negative.  She wonders if that anything else is going on. ? 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 Enzo Bi at least 10 years ago.  She no longer has significant vaginal bulging.  She has to sit on a pillow sometimes but is unsure of small recurrent vaginal bulging but does not seem to be bothered by this.   ? ?She has had constipation in the past. She does not think she is emptying her bladder fully.  ? ?She leaks on occasion.  She is less bothered by this. ? ? ?PMH: ?Past Medical History:  ?Diagnosis Date  ? Arthritis   ? knees  ? Basal cell carcinoma 10/23/2020  ? right lateral ankle EDC done 12/18/20  ? Basal cell carcinoma 05/13/2021  ? right pretibia, [facility] 07/22/21  ? Basal cell carcinoma 12/03/2021  ? left chest, [facility] 01/06/2022  ? Basal cell carcinoma (BCC) 05/13/2021  ? left forehead, Moh's 07/29/2021  ? Candida esophagitis ([place]) 06/30/2021  ? E. coli UTI 09/10/2015  ? Resistant to cipro .  Keflex sent x 5 days   ? History of dysplastic nevus 05/13/2021  ? right dorsal hand, mild  ? Hyperlipidemia   ? Hypothyroidism   ?  PVC's (premature ventricular contractions)   ? a.  48-hour Holter in 3/17 demonstrated NSR, frequent PVCs totaling 5300 beats representing 3% burden, occasional PACs, short runs of SVT  ? ? ?Surgical History: ?Past Surgical History:  ?Procedure Laterality Date  ? ABDOMINAL HYSTERECTOMY    ? In 30's  ? BROW LIFT Bilateral 07/12/2017  ? Procedure: BLEPHAROPLASTY upper eyelid with excess skin;  Surgeon: Karle Starch, MD;  Location: [place];  Service: Ophthalmology;  Laterality: Bilateral;  MAC  ? COLONOSCOPY WITH PROPOFOL N/A 12/29/2015  ? Procedure: COLONOSCOPY WITH PROPOFOL;  Surgeon: Hulen Luster, MD;  [facility] END[facility]COPY;  Service: Gastroenterology;  Laterality: N/A;  ? COLONOSCOPY WITH PROPOFOL N/A 12/03/2019  ? Procedure: COLONOSCOPY WITH BIOPSY;  Surgeon: [name], MD;  Location: [place];  Service: Endoscopy;  Laterality: N/A;  ? ESOPHAGOGASTRODUODENOSCOPY (EGD) WITH PROPOFOL N/A 06/25/2021  ? Procedure: ESOPHAGOGASTRODUODENOSCOPY (EGD) WITH PROPOFOL;  Surgeon: [name], MD;  Location: [place];  Service: Endoscopy;  Laterality: N/A;  ? POLYPECTOMY N/A 12/03/2019  ? Procedure: POLYPECTOMY;  Surgeon: [name], MD;  Location: [place];  Service: Endoscopy;  Laterality: N/A;  ? PTOSIS REPAIR Bilateral 07/12/2017  ? Procedure: ptosis repair resect ex;  Surgeon: Karle Starch, MD;  Location: [place];  Service: Ophthalmology;  Laterality: Bilateral;  Requests early  ?  VAGINAL PROLAPSE REPAIR  2014  ? Dr. Enzo Bi  ? ? ?Home Medications:  ?Allergies as of 04/02/2022   ? ?   Reactions  ? Prednisone   ? "crazy"  ? ?  ? ?  ?Medication List  ?  ? ?  ? Accurate as of Apr 02, 2022 11:59 PM. If you have any questions, ask your nurse or doctor.  ?  ?  ? ?  ? ?aspirin EC 81 MG tablet ?Take 81 mg by mouth daily. ?  ?cholecalciferol 1000 units tablet ?Commonly known as: VITAMIN D ?Take 2,000 Units by mouth daily. ?  ?diltiazem 120 MG 24 hr capsule ?Commonly known  as: CARDIZEM CD ?TAKE 1 CAPSULE BY MOUTH  DAILY ?  ?diphenhydramine-acetaminophen 25-500 MG Tabs tablet ?Commonly known as: TYLENOL PM ?Take 1 tablet by mouth at bedtime as needed. ?  ?Fish Oil 1000 MG Cpdr ?Take by mouth daily. ?  ?glucosamine-chondroitin 500-400 MG tablet ?Take 1 tablet by mouth daily. ?  ?isosorbide mononitrate 30 MG 24 hr tablet ?Commonly known as: IMDUR ?Take 1 tablet (30 mg total) by mouth daily. ?  ?levothyroxine 50 MCG tablet ?Commonly known as: SYNTHROID ?TAKE 1 TABLET BY MOUTH  DAILY ?  ?mometasone 0.1 % lotion ?Commonly known as: ELOCON ?SMARTSIG:2-4 Drop(s) In Ear(s) Every Night PRN ?  ?multivitamin with minerals tablet ?Take 1 tablet by mouth daily. ?  ?pantoprazole 20 MG tablet ?Commonly known as: PROTONIX ?TAKE 1 TABLET BY MOUTH EVERY DAY ?  ?rosuvastatin 10 MG tablet ?Commonly known as: CRESTOR ?TAKE 1 TABLET BY MOUTH  DAILY ?  ?telmisartan 40 MG tablet ?Commonly known as: MICARDIS ?TAKE 1 TABLET BY MOUTH DAILY IN  THE MORNING FOR BLOOD PRESSURE ?  ?traZODone 50 MG tablet ?Commonly known as: DESYREL ?TAKE 1/2 TO 1 TABLET BY MOUTH AT BEDTIME AS NEEDED FOR SLEEP ?  ? ?  ? ? ?Allergies:  ?Allergies  ?Allergen Reactions  ? Prednisone   ?  "crazy"  ? ? ?Family History: ?Family History  ?Problem Relation Age of Onset  ? Breast cancer Mother   ?     60's  ? Heart attack Mother   ? Diabetes Mother   ? Stroke Father   ? Diabetes Father   ? Pancreatic cancer Sister   ? Cancer Sister   ?     pancreatic  ? Cancer Sister   ?     anal  ? Breast cancer Sister   ? Breast cancer Sister   ? Cancer Brother   ?     liver  ? Diabetes Brother   ? Heart attack Brother   ? Diabetes Brother   ? Cancer Brother   ?     stomach  ? Cancer Brother   ? Diabetes Brother   ? Colon cancer Neg Hx   ? ? ?Social History:  reports that she has quit smoking. Her smoking use included cigarettes. She has been exposed to tobacco smoke. She has never used smokeless tobacco. She reports that she does not drink alcohol and does  not use drugs. ? ? ?Physical Exam: ?BP (!) 144/74   Pulse 76   Ht _0  (1.702 m)   Wt 136 lb (61.7 kg)   BMI 21.30 kg/m?   ?Constitutional:  Alert and oriented, No acute distress. ?HEENT: [place] AT, moist mucus membranes.  Trachea midline, no masses. ?Cardiovascular: No clubbing, cyanosis, or edema. ?Respiratory: Normal respiratory effort, no increased work of breathing. ?Skin: No rashes, bruises or suspicious  lesions. ?Neurologic: Grossly intact, no focal deficits, moving all 4 extremities. ?Psychiatric: Normal mood and affect. ? ?Laboratory Data: ?Lab Results  ?Component Value Date  ? CREATININE 0.93 12/30/2021  ? ?Lab Results  ?Component Value Date  ? HGBA1C 6.0 12/30/2021  ? ?Assessment & Plan:   ?Dysuria  ?- Not associated with obvious infection  ?- She denies gross hematuria  ?- Will further evaluate with pelvic exam and cystoscopy  ?- May benefit from topical estrogen cream as her symptoms may be estrogen related  ? ?F/u cysto/ pelvic exam ? ?Conley Rolls as a Education administrator for Hollice Espy, MD.,have documented all relevant documentation on the behalf of Hollice Espy, MD,as directed by  Hollice Espy, MD while in the presence of Hollice Espy, MD. ? ?I have reviewed the above documentation for accuracy and completeness, and I agree with the above.  ? ?Hollice Espy, MD ? ? ? ?[place] ?[address], [address] ?[place], [place] [address] ?[phone] ?

## 2022-04-05 ENCOUNTER — Ambulatory Visit
Admission: RE | Admit: 2022-04-05 | Discharge: 2022-04-05 | Disposition: A | Discharge status: Home / Self Care | Payer: Medicare Other | Source: Ambulatory Visit | Attending: Internal Medicine | Admitting: Internal Medicine

## 2022-04-05 DIAGNOSIS — Z1231 Encounter for screening mammogram for malignant neoplasm of breast: Secondary | ICD-10-CM | POA: Insufficient documentation

## 2022-04-09 ENCOUNTER — Ambulatory Visit: Payer: Medicare Other | Admitting: Urology

## 2022-04-09 VITALS — BP 140/74 | HR 65 | Ht 67.0 in | Wt 135.0 lb

## 2022-04-09 DIAGNOSIS — N952 Postmenopausal atrophic vaginitis: Secondary | ICD-10-CM | POA: Diagnosis not present

## 2022-04-09 DIAGNOSIS — N362 Urethral caruncle: Secondary | ICD-10-CM

## 2022-04-09 DIAGNOSIS — R300 Dysuria: Secondary | ICD-10-CM

## 2022-04-09 MED ORDER — ESTRADIOL 0.1 MG/GM VA CREA: TOPICAL_CREAM | VAGINAL | 12 refills | Status: DC

## 2022-04-09 NOTE — Patient Instructions (Addendum)
Estrogen Cream Instruction: Discard applicator Apply pea sized amount to tip of finger to urethra before bed. Wash hands well after application. Use Monday, Wednesday and Friday   A urethral caruncle is a benign outgrowth at the urethral meatus (urethral opening). They occur most commonly in postmenopausal women. Urethral caruncles occur when the outermost part of the urethra everts or turns out. When the mucosa is circumferentially everted, meaning the growth encompasses the entire diameter of the urethra, rather than just a segment of the urethra, the lesion is a urethral prolapse.  Urethral caruncles are often asymptomatic and found on pelvic exam.  Symptoms may include pain, light bleeding, painful urination, or impeded flow of urine. Women may also notice a bump at the urethral meatus.  In asymptomatic patients, treatment is often not necessary. For symptomatic patients, treatment includes vaginal estrogen cream, anti-inflammatory medications such as Motrin, and warm sitz baths.  Surgical excision is recommended for larger, symptomatic caruncles that do not respond to more conservative treatment.

## 2022-04-09 NOTE — Progress Notes (Signed)
   04/09/22  CC:  Chief Complaint  Patient presents with   Cysto   HPI: [name] is a 81 y.o.female with a personal history of dysuria who presents today for a diagnostic cystoscopy.   She is s/p vaginal prolapse repair x10 years ago.   Her most recent UA was on 04/02/2022 and it showed non squamous epithelial cells and rare bacteria.    Vitals:   04/09/22 1343  BP: 140/74  Pulse: 65   NED. A&Ox3.   No respiratory distress   Abd soft, NT, ND Normal external genitalia with patent urethral meatus  Cystoscopy Procedure Note  Patient identification was confirmed, informed consent was obtained, and patient was prepped using Betadine solution.  Lidocaine jelly was administered per urethral meatus.    Procedure: - Flexible cystoscope introduced, without any difficulty.   - Thorough search of the bladder revealed:    Urethral caruncle     Of trigone which normally reduced and unremarkable     normal urothelium    no stones    no ulcers     no tumors    no urethral polyps    no trabeculation  - Ureteral orifices were normal in position and appearance.  Post-Procedure: - Patient tolerated the procedure well  Pelvic exam:small urethral caruncle atrophic decent of vaginal cuff to level of  introitus   Assessment/ Plan:  Urethral caruncle  A urethral caruncle is a benign outgrowth at the urethral meatus (urethral opening). They occur most commonly in postmenopausal women. Urethral caruncles occur when the outermost part of the urethra everts or turns out. When the mucosa is circumferentially everted, meaning the growth encompasses the entire diameter of the urethra, rather than just a segment of the urethra, the lesion is a urethral prolapse.  Urethral caruncles are often asymptomatic and found on pelvic exam.  Symptoms may include pain, light bleeding, painful urination, or impeded flow of urine. Women may also notice a bump at the urethral meatus.  In  asymptomatic patients, treatment is often not necessary. For symptomatic patients, treatment includes vaginal estrogen cream, anti-inflammatory medications such as Motrin, and warm sitz baths.  Surgical excision is recommended for larger, symptomatic caruncles that do not respond to more conservative treatment.  - She is interested in estrogen cream   2.  Vaginal atrophy -As above  3.  Pelvic organ prolapse, primarily the apex -Relatively asymptomatic, would recommend no intervention   Follow-up in 1 year  Conley Rolls as a scribe for Hollice Espy, MD.,have documented all relevant documentation on the behalf of Hollice Espy, MD,as directed by  Hollice Espy, MD while in the presence of Hollice Espy, MD.  I have reviewed the above documentation for accuracy and completeness, and I agree with the above.   Hollice Espy, MD

## 2022-04-16 ENCOUNTER — Other Ambulatory Visit: Payer: Self-pay | Admitting: Internal Medicine

## 2022-05-03 IMAGING — MG MM DIGITAL SCREENING BILAT W/ TOMO AND CAD
8 series · 9 of 24 positions shown · non-contrast
Comparison: Previous exam(s).

CLINICAL DATA: Screening.

EXAM:
DIGITAL SCREENING BILATERAL MAMMOGRAM WITH TOMOSYNTHESIS AND CAD
TECHNIQUE: Bilateral screening digital craniocaudal and mediolateral oblique
mammograms were obtained. Bilateral screening digital breast
tomosynthesis was performed. The images were evaluated with
computer-aided detection.

[R CC synth-2D]
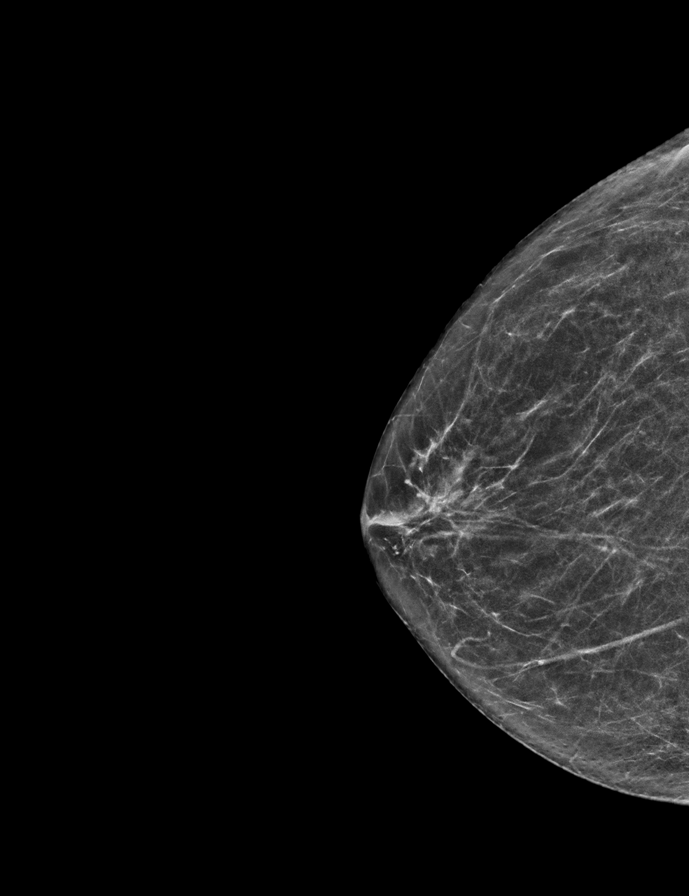

[L CC synth-2D]
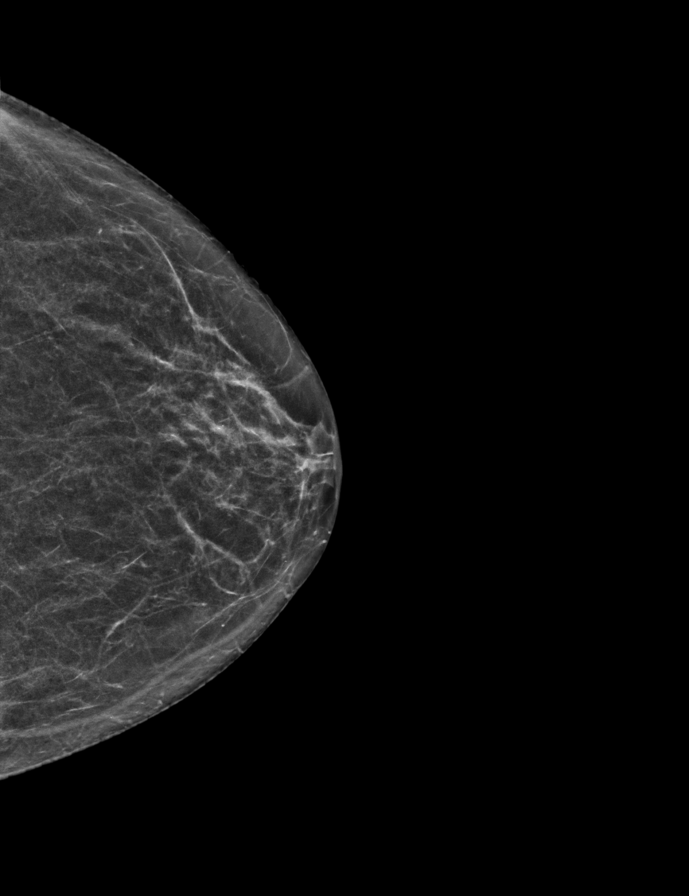

[R MLO synth-2D]
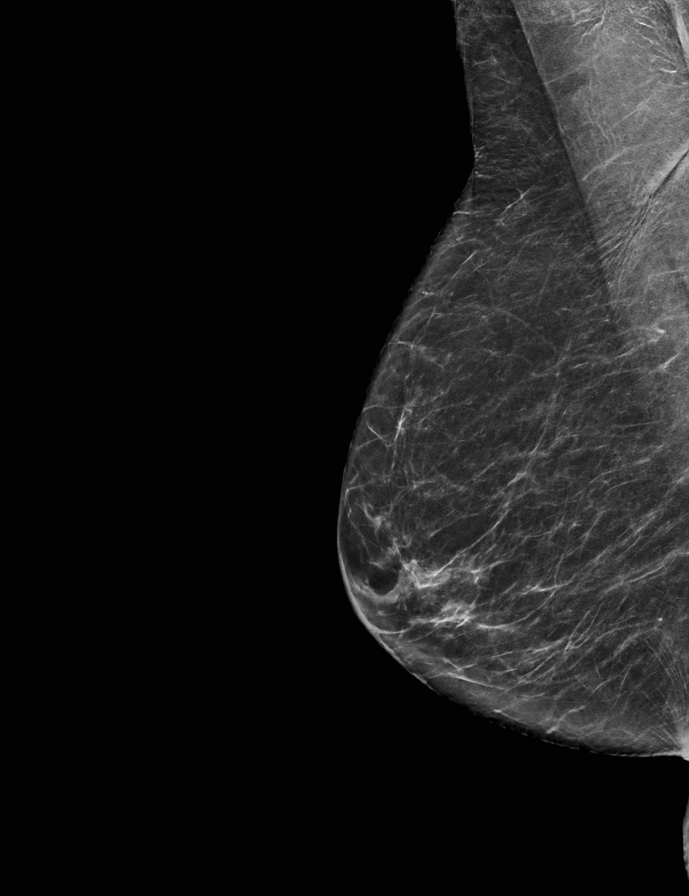

[L MLO synth-2D]
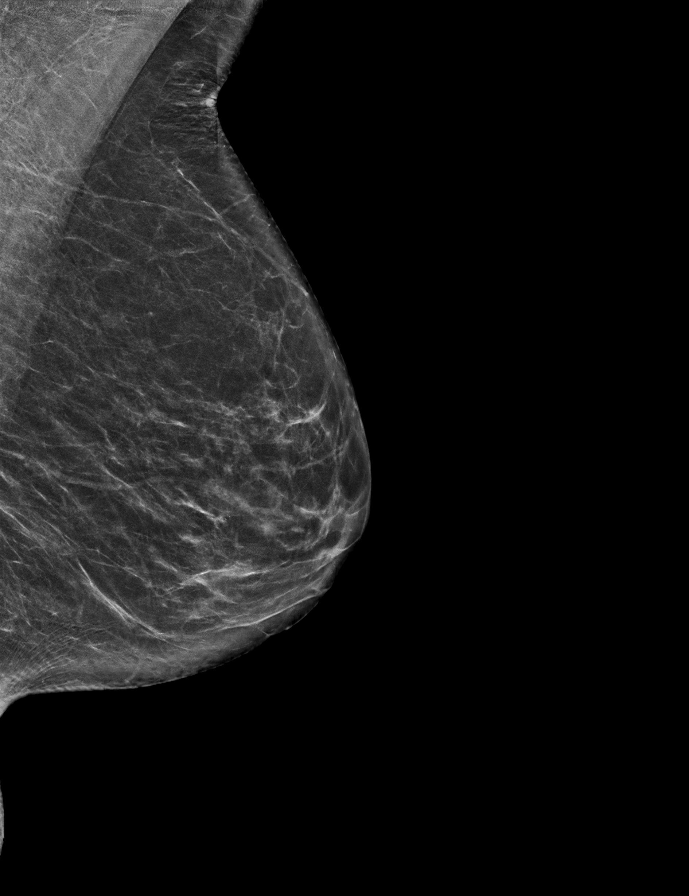

[L CC tomo · 2 of 52 frames shown]
[frame 17/52]
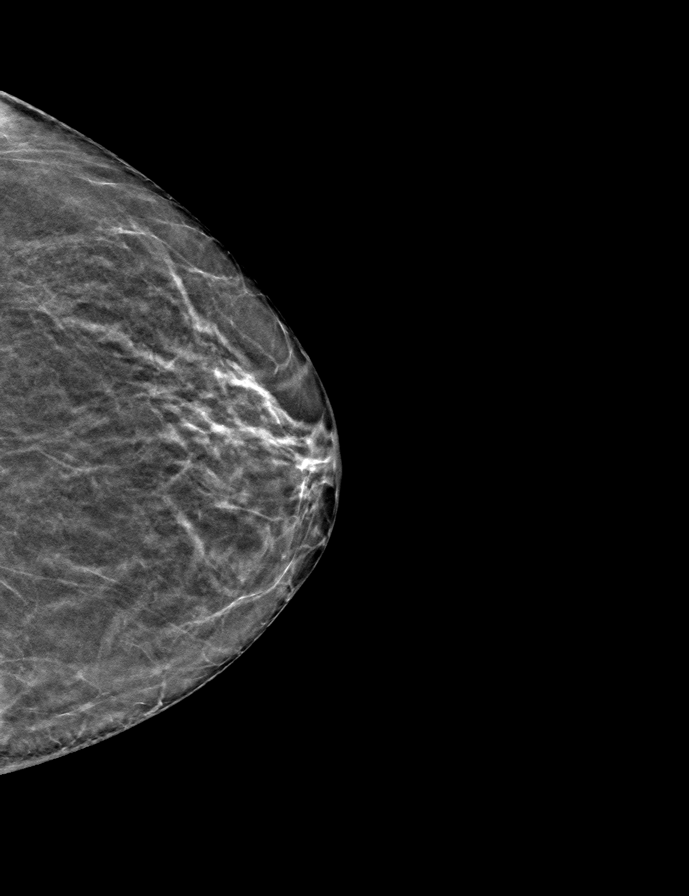
[frame 27/52]
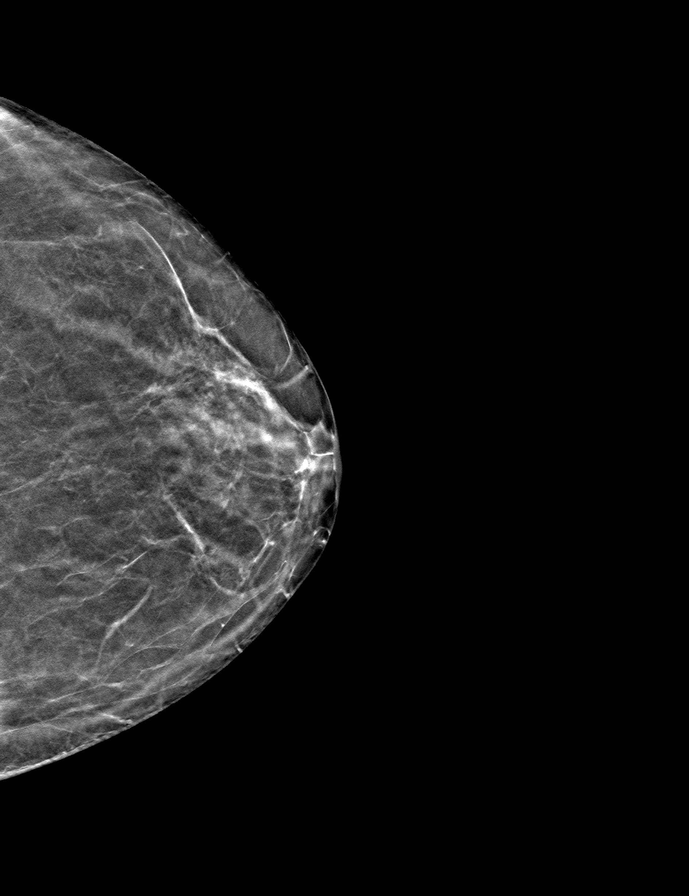

[L MLO tomo · tomo slice 33/66.0]
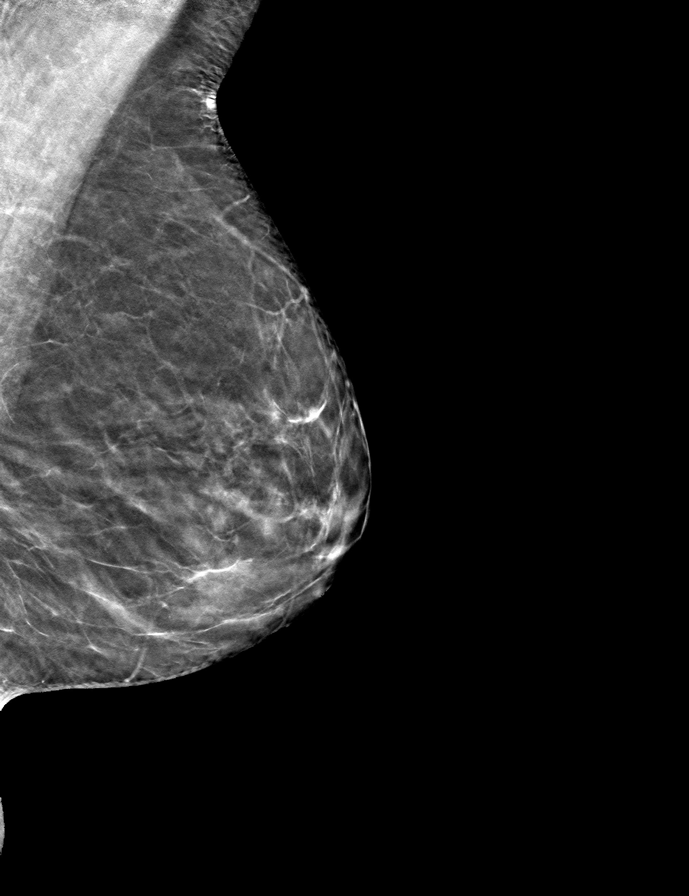

[R CC tomo · tomo slice 27/53.0]
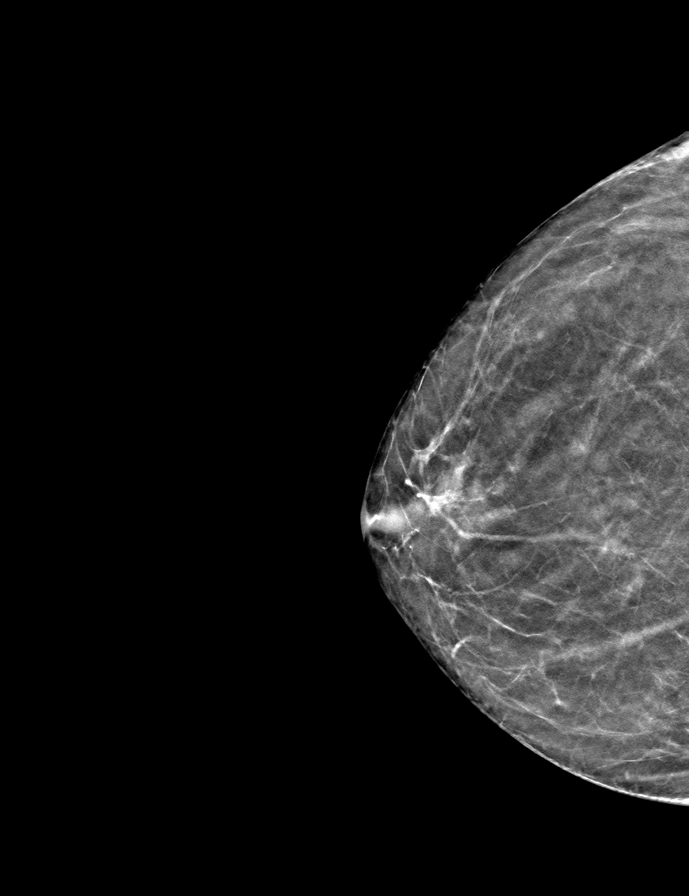

[R MLO tomo · tomo slice 34/67.0]
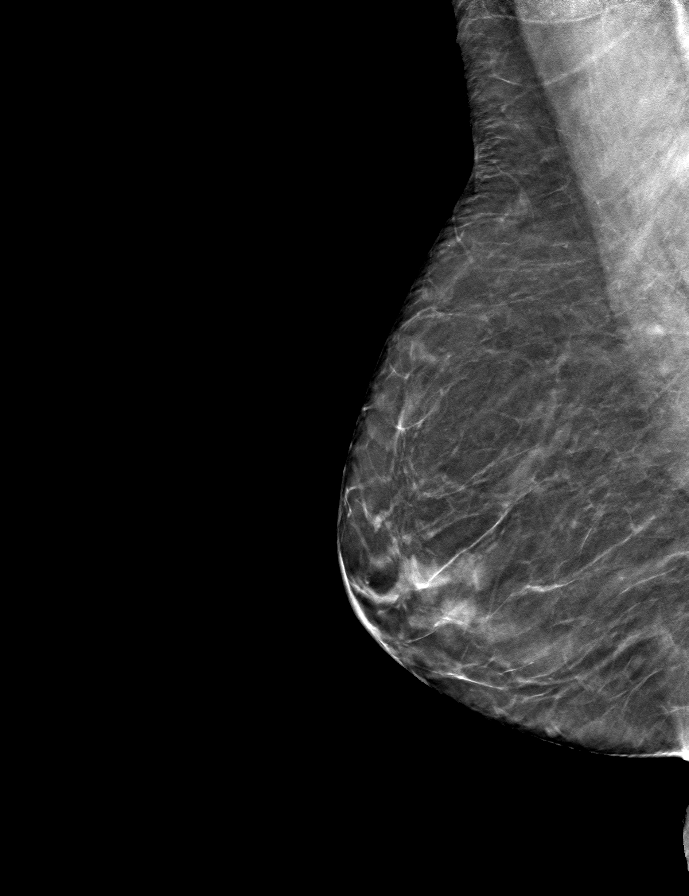

[9 of 24 positions shown; findings below may reference images not displayed]

ACR Breast Density Category b: There are scattered areas of
fibroglandular density.
FINDINGS: There are no findings suspicious for malignancy. The images were
evaluated with computer-aided detection.
IMPRESSION: No mammographic evidence of malignancy. A result letter of this
screening mammogram will be mailed directly to the patient.

RECOMMENDATION:
Screening mammogram in one year. (Code:[id number]-I-BG6)

BI-RADS CATEGORY  1: Negative.

## 2022-05-14 ENCOUNTER — Other Ambulatory Visit: Payer: Self-pay | Admitting: Internal Medicine

## 2022-06-30 ENCOUNTER — Encounter: Payer: Self-pay | Admitting: Internal Medicine

## 2022-06-30 ENCOUNTER — Ambulatory Visit (INDEPENDENT_AMBULATORY_CARE_PROVIDER_SITE_OTHER): Payer: Medicare Other | Admitting: Internal Medicine

## 2022-06-30 VITALS — BP 128/76 | HR 73 | Temp 97.6°F | Ht 67.0 in | Wt 132.4 lb

## 2022-06-30 DIAGNOSIS — R7303 Prediabetes: Secondary | ICD-10-CM | POA: Diagnosis not present

## 2022-06-30 DIAGNOSIS — E7849 Other hyperlipidemia: Secondary | ICD-10-CM

## 2022-06-30 DIAGNOSIS — E039 Hypothyroidism, unspecified: Secondary | ICD-10-CM | POA: Diagnosis not present

## 2022-06-30 DIAGNOSIS — R5382 Chronic fatigue, unspecified: Secondary | ICD-10-CM | POA: Diagnosis not present

## 2022-06-30 DIAGNOSIS — Z0000 Encounter for general adult medical examination without abnormal findings: Secondary | ICD-10-CM

## 2022-06-30 DIAGNOSIS — I700 Atherosclerosis of aorta: Secondary | ICD-10-CM | POA: Diagnosis not present

## 2022-06-30 LAB — COMPREHENSIVE METABOLIC PANEL
ALT: 22 U/L (ref 0–35)
AST: 25 U/L (ref 0–37)
Albumin: 4.3 g/dL (ref 3.5–5.2)
Alkaline Phosphatase: 34 U/L — ABNORMAL LOW (ref 39–117)
BUN: 14 mg/dL (ref 6–23)
CO2: 31 mEq/L (ref 19–32)
Calcium: 10 mg/dL (ref 8.4–10.5)
Chloride: 101 mEq/L (ref 96–112)
Creatinine, Ser: 0.92 mg/dL (ref 0.40–1.20)
GFR: 58.56 mL/min — ABNORMAL LOW (ref >60.00)
Glucose, Bld: 85 mg/dL (ref 70–99)
Potassium: 4.6 mEq/L (ref 3.5–5.1)
Sodium: 140 mEq/L (ref 135–145)
Total Bilirubin: 0.5 mg/dL (ref 0.2–1.2)
Total Protein: 7.1 g/dL (ref 6.0–8.3)

## 2022-06-30 LAB — CBC WITH DIFFERENTIAL/PLATELET
Basophils Absolute: 0.1 10*3/uL (ref 0.0–0.1)
Basophils Relative: 0.9 % (ref 0.0–3.0)
Eosinophils Absolute: 0.3 10*3/uL (ref 0.0–0.7)
Eosinophils Relative: 4 % (ref 0.0–5.0)
HCT: 38.4 % (ref 36.0–46.0)
Hemoglobin: 12.7 g/dL (ref 12.0–15.0)
Lymphocytes Relative: 23.5 % (ref 12.0–46.0)
Lymphs Abs: 1.5 10*3/uL (ref 0.7–4.0)
MCHC: 33.1 g/dL (ref 30.0–36.0)
MCV: 98.7 fl (ref 78.0–100.0)
Monocytes Absolute: 0.5 10*3/uL (ref 0.1–1.0)
Monocytes Relative: 7.1 % (ref 3.0–12.0)
Neutro Abs: 4.1 10*3/uL (ref 1.4–7.7)
Neutrophils Relative %: 64.5 % (ref 43.0–77.0)
Platelets: 233 10*3/uL (ref 150.0–400.0)
RBC: 3.89 Mil/uL (ref 3.87–5.11)
RDW: 12.6 % (ref 11.5–15.5)
WBC: 6.4 10*3/uL (ref 4.0–10.5)

## 2022-06-30 LAB — TSH: TSH: 2.39 u[IU]/mL (ref 0.35–5.50)

## 2022-06-30 LAB — HEMOGLOBIN A1C: Hgb A1c MFr Bld: 6.3 % (ref 4.6–6.5)

## 2022-06-30 MED ORDER — ESTRADIOL 0.1 MG/GM VA CREA: TOPICAL_CREAM | VAGINAL | 12 refills | Status: DC

## 2022-06-30 NOTE — Progress Notes (Signed)
Patient ID: [name], female    DOB: January 13, 1941  Age: 81 y.o. MRN: [id number]  The patient is here for annual preventive s examination and management of other chronic and acute problems.   The risk factors are reflected in the social history.  The roster of all physicians providing medical care to patient - is listed in the Snapshot section of the chart.  Activities of daily living:  The patient is 100% independent in all ADLs: dressing, toileting, feeding as well as independent mobility  Home safety : The patient has smoke detectors in the home. They wear seatbelts.  There are no firearms at home. There is no violence in the home.   There is no risks for hepatitis, STDs or HIV. There is no   history of blood transfusion. They have no travel history to infectious disease endemic areas of the world.  The patient has seen their dentist in the last six month. They have seen their eye doctor in the last year. They admit to slight hearing difficulty with regard to whispered voices and some television programs.  They have deferred audiologic testing in the last year.  They do not  have excessive sun exposure. Discussed the need for sun protection: hats, long sleeves and use of sunscreen if there is significant sun exposure.   Diet: the importance of a healthy diet is discussed. They do have a healthy diet.  The benefits of regular aerobic exercise were discussed. She walks 4 times per week ,  20 minutes.   Depression screen: there are no signs or vegative symptoms of depression- irritability, change in appetite, anhedonia, sadness/tearfullness.  Cognitive assessment: the patient manages all their financial and personal affairs and is actively engaged. They could relate day,date,year and events; recalled 2/3 objects at 3 minutes; performed clock-face test normally.  The following portions of the patient's history were reviewed and updated as appropriate: allergies, current medications, past  family history, past medical history,  past surgical history, past social history  and problem list.  Visual acuity was not assessed per patient preference since she has regular follow up with her ophthalmologist. Hearing and body mass index were assessed and reviewed.   During the course of the visit the patient was educated and counseled about appropriate screening and preventive services including : fall prevention , diabetes screening, nutrition counseling, colorectal cancer screening, and recommended immunizations.    CC: The primary encounter diagnosis was Acquired hypothyroidism. Diagnoses of Familial hyperlipidemia, Chronic fatigue, Prediabetes, Encounter for preventive health examination, and Abdominal aortic atherosclerosis ([place]) were also pertinent to this visit.  1) atrophic vaginitis :  she has been using premarin samples from Urology for atrophic vaginitis and urethral caruncle     History [name] has a past medical history of Arthritis, Basal cell carcinoma (10/23/2020), Basal cell carcinoma (05/13/2021), Basal cell carcinoma (12/03/2021), Basal cell carcinoma (BCC) (05/13/2021), Candida esophagitis ([place]) (06/30/2021), E. coli UTI (09/10/2015), History of dysplastic nevus (05/13/2021), Hyperlipidemia, Hypothyroidism, and PVC's (premature ventricular contractions).   She has a past surgical history that includes Abdominal hysterectomy; Vaginal prolapse repair (2014); Colonoscopy with propofol (N/A, 12/29/2015); Brow lift (Bilateral, 07/12/2017); Ptosis repair (Bilateral, 07/12/2017); Colonoscopy with propofol (N/A, 12/03/2019); polypectomy (N/A, 12/03/2019); and Esophagogastroduodenoscopy (egd) with propofol (N/A, 06/25/2021).   Her family history includes Breast cancer in her mother, sister, and sister; Cancer in her brother, brother, brother, sister, and sister; Diabetes in her brother, brother, brother, father, and mother; Heart attack in her brother and mother; Pancreatic cancer in  her  sister; Stroke in her father.She reports that she has quit smoking. Her smoking use included cigarettes. She has been exposed to tobacco smoke. She has never used smokeless tobacco. She reports that she does not drink alcohol and does not use drugs.  Outpatient Medications Prior to Visit  Medication Sig Dispense Refill   aspirin EC 81 MG tablet Take 81 mg by mouth daily.     cholecalciferol (VITAMIN D) 1000 UNITS tablet Take 2,000 Units by mouth daily.      Cranberry 450 MG CAPS Take by mouth.     diphenhydramine-acetaminophen (TYLENOL PM) 25-500 MG TABS tablet Take 1 tablet by mouth at bedtime as needed.     glucosamine-chondroitin 500-400 MG tablet Take 1 tablet by mouth daily.      isosorbide mononitrate (IMDUR) 30 MG 24 hr tablet Take 1 tablet (30 mg total) by mouth daily. 90 tablet 3   levothyroxine (SYNTHROID) 50 MCG tablet TAKE 1 TABLET BY MOUTH  DAILY 90 tablet 3   mometasone (ELOCON) 0.1 % lotion SMARTSIG:2-4 Drop(s) In Ear(s) Every Night PRN     Multiple Vitamins-Minerals (MULTIVITAMIN WITH MINERALS) tablet Take 1 tablet by mouth daily.     NIACINAMIDE PO Take by mouth.     Omega-3 Fatty Acids (FISH OIL) 1000 MG CPDR Take by mouth daily.     pantoprazole (PROTONIX) 20 MG tablet TAKE 1 TABLET BY MOUTH EVERY DAY 90 tablet 3   rosuvastatin (CRESTOR) 10 MG tablet TAKE 1 TABLET BY MOUTH  DAILY 90 tablet 3   telmisartan (MICARDIS) 40 MG tablet TAKE 1 TABLET BY MOUTH DAILY IN  THE MORNING FOR BLOOD PRESSURE 90 tablet 3   traZODone (DESYREL) 50 MG tablet TAKE 1/2 TO 1 TABLET BY MOUTH AT BEDTIME AS NEEDED FOR SLEEP 90 tablet 2   diltiazem (CARDIZEM CD) 120 MG 24 hr capsule TAKE 1 CAPSULE BY MOUTH  DAILY 90 capsule 2   estradiol (ESTRACE) 0.1 MG/GM vaginal cream Estrogen Cream Instruction Discard applicator Apply pea sized amount to tip of finger to urethra before bed. Wash hands well after application. Use Monday, Wednesday and Friday 42.5 g 12   No facility-administered medications prior to  visit.    Review of Systems  Patient denies headache, fevers, malaise, unintentional weight loss, skin rash, eye pain, sinus congestion and sinus pain, sore throat, dysphagia,  hemoptysis , cough, dyspnea, wheezing, chest pain, palpitations, orthopnea, edema, abdominal pain, nausea, melena, diarrhea, constipation, flank pain, dysuria, hematuria, urinary  Frequency, nocturia, numbness, tingling, seizures,  Focal weakness, Loss of consciousness,  Tremor, insomnia, depression, anxiety, and suicidal ideation.     Objective:  BP 128/76 (BP Location: Left Arm, Patient Position: Sitting, Cuff Size: Normal)   Pulse 73   Temp 97.6 F (36.4 C) (Oral)   Ht '5\' 7"'$  (1.702 m)   Wt 132 lb 6.4 oz (60.1 kg)   SpO2 97%   BMI 20.74 kg/m   Physical Exam   General appearance: alert, cooperative and appears stated age Ears: normal TM's and external ear canals both ears Throat: lips, mucosa, and tongue normal; teeth and gums normal Neck: no adenopathy, no carotid bruit, supple, symmetrical, trachea midline and thyroid not enlarged, symmetric, no tenderness/mass/nodules Back: symmetric, no curvature. ROM normal. No CVA tenderness. Lungs: clear to auscultation bilaterally Heart: regular rate and rhythm, S1, S2 normal, no murmur, click, rub or gallop Abdomen: soft, non-tender; bowel sounds normal; no masses,  no organomegaly Pulses: 2+ and symmetric Skin: Skin color, texture, turgor normal. No  rashes or lesions Lymph nodes: Cervical, supraclavicular, and axillary nodes normal.    Assessment & Plan:   Problem List Items Addressed This Visit     Familial hyperlipidemia    Managed with crestor,  LDL is now 23 .  lfts normal.  No changes today   Lab Results  Component Value Date   CHOL 171 12/30/2021   HDL 60.10 12/30/2021   LDLCALC 86 12/30/2021   LDLDIRECT 85.0 03/06/2018   TRIG 125.0 12/30/2021   CHOLHDL 3 12/30/2021   Lab Results  Component Value Date   ALT 22 06/30/2022   AST 25  06/30/2022   ALKPHOS 34 (L) 06/30/2022   BILITOT 0.5 06/30/2022          Acquired hypothyroidism - Primary   Relevant Orders   TSH (Completed)   Encounter for preventive health examination    age appropriate education and counseling updated, referrals for preventative services and immunizations addressed, dietary and smoking counseling addressed, most recent labs reviewed.  I have personally reviewed and have noted:   1) the patient's medical and social history 2) The pt's use of alcohol, tobacco, and illicit drugs 3) The patient's current medications and supplements 4) Functional ability including ADL's, fall risk, home safety risk, hearing and visual impairment 5) Diet and physical activities 6) Evidence for depression or mood disorder 7) The patient's height, weight, and BMI have been recorded in the chart   I have made referrals, and provided counseling and education based on review of the above      Abdominal aortic atherosclerosis ([place])    Reviewed findings of prior CT scan today..  Patient is tolerating high potency statin therapy       Prediabetes   Relevant Orders   Hemoglobin A1c (Completed)   Other Visit Diagnoses     Chronic fatigue       Relevant Orders   Comprehensive metabolic panel (Completed)   CBC with Differential/Platelet (Completed)       Meds ordered this encounter  Medications   estradiol (ESTRACE) 0.1 MG/GM vaginal cream    Sig: Estrogen Cream Instruction Discard applicator Apply pea sized amount to tip of finger to urethra before bed. Wash hands well after application. Use Monday, Wednesday and Friday    Dispense:  42.5 g    Refill:  12    Medications Discontinued During This Encounter  Medication Reason   estradiol (ESTRACE) 0.1 MG/GM vaginal cream Reorder    Follow-up: Return in about 6 months (around 12/31/2022).   [name], MD

## 2022-06-30 NOTE — Patient Instructions (Addendum)
We are rechecking your kidney function to see if it has returned to normal.  If it has not,  I will let you know and recommend seeing a kidney specialist  Continue using tylenol instead of aleve or motrin,  it Is safer and does not affect kidney function   For the insomnia:  Try taking trazodone earlier (f it leaves you tired in the morning) , or  You might want to try using Relaxium for insomnia  (as seen on TV commercials) . It contains:  Melatonin 5 mg  Chamomile 25 mg Passionflower extract 75 mg GABA 100 mg Ashwaganda extract 125 mg Magnesium citrate, glycinate, oxide (100 mg)  L tryptophan 500 mg Valerest (proprietary  ingredient ; probably valeria root extract)

## 2022-07-01 ENCOUNTER — Other Ambulatory Visit: Payer: Self-pay | Admitting: Cardiovascular Disease

## 2022-07-01 ENCOUNTER — Other Ambulatory Visit: Payer: Self-pay

## 2022-07-01 ENCOUNTER — Encounter: Payer: Self-pay | Admitting: Internal Medicine

## 2022-07-01 DIAGNOSIS — R5382 Chronic fatigue, unspecified: Secondary | ICD-10-CM

## 2022-07-01 DIAGNOSIS — I10 Essential (primary) hypertension: Secondary | ICD-10-CM

## 2022-07-01 DIAGNOSIS — E039 Hypothyroidism, unspecified: Secondary | ICD-10-CM

## 2022-07-01 DIAGNOSIS — R7303 Prediabetes: Secondary | ICD-10-CM

## 2022-07-01 DIAGNOSIS — R944 Abnormal results of kidney function studies: Secondary | ICD-10-CM

## 2022-07-01 DIAGNOSIS — I493 Ventricular premature depolarization: Secondary | ICD-10-CM

## 2022-07-02 NOTE — Telephone Encounter (Signed)
I spoke with patient and referral has been placed.

## 2022-07-03 NOTE — Assessment & Plan Note (Signed)

## 2022-07-03 NOTE — Assessment & Plan Note (Signed)
Managed with crestor,  LDL is now 86 .  lfts normal.  No changes today   Lab Results  Component Value Date   CHOL 171 12/30/2021   HDL 60.10 12/30/2021   LDLCALC 86 12/30/2021   LDLDIRECT 85.0 03/06/2018   TRIG 125.0 12/30/2021   CHOLHDL 3 12/30/2021   Lab Results  Component Value Date   ALT 22 06/30/2022   AST 25 06/30/2022   ALKPHOS 34 (L) 06/30/2022   BILITOT 0.5 06/30/2022

## 2022-07-03 NOTE — Assessment & Plan Note (Signed)
Reviewed findings of prior CT scan today..  Patient is tolerating high potency statin therapy  

## 2022-07-07 ENCOUNTER — Ambulatory Visit: Payer: Medicare Other | Admitting: Dermatology

## 2022-07-07 DIAGNOSIS — D229 Melanocytic nevi, unspecified: Secondary | ICD-10-CM

## 2022-07-07 DIAGNOSIS — D1800 Hemangioma unspecified site: Secondary | ICD-10-CM | POA: Diagnosis not present

## 2022-07-07 DIAGNOSIS — Z85828 Personal history of other malignant neoplasm of skin: Secondary | ICD-10-CM

## 2022-07-07 DIAGNOSIS — L720 Epidermal cyst: Secondary | ICD-10-CM | POA: Diagnosis not present

## 2022-07-07 DIAGNOSIS — D485 Neoplasm of uncertain behavior of skin: Secondary | ICD-10-CM

## 2022-07-07 DIAGNOSIS — Z1283 Encounter for screening for malignant neoplasm of skin: Secondary | ICD-10-CM

## 2022-07-07 DIAGNOSIS — L578 Other skin changes due to chronic exposure to nonionizing radiation: Secondary | ICD-10-CM | POA: Diagnosis not present

## 2022-07-07 DIAGNOSIS — L820 Inflamed seborrheic keratosis: Secondary | ICD-10-CM | POA: Diagnosis not present

## 2022-07-07 DIAGNOSIS — L570 Actinic keratosis: Secondary | ICD-10-CM | POA: Diagnosis not present

## 2022-07-07 DIAGNOSIS — L814 Other melanin hyperpigmentation: Secondary | ICD-10-CM

## 2022-07-07 DIAGNOSIS — L821 Other seborrheic keratosis: Secondary | ICD-10-CM | POA: Diagnosis not present

## 2022-07-07 NOTE — Patient Instructions (Addendum)
Wound Care Instructions  Cleanse wound gently with soap and water once a day then pat dry with clean gauze. Apply a thin coat of Petrolatum (petroleum jelly, "Vaseline") over the wound (unless you have an allergy to this). We recommend that you use a new, sterile tube of Vaseline. Do not pick or remove scabs. Do not remove the yellow or white "healing tissue" from the base of the wound.  Cover the wound with fresh, clean, nonstick gauze and secure with paper tape. You may use Band-Aids in place of gauze and tape if the wound is small enough, but would recommend trimming much of the tape off as there is often too much. Sometimes Band-Aids can irritate the skin.  You should call the office for your biopsy report after 1 week if you have not already been contacted.  If you experience any problems, such as abnormal amounts of bleeding, swelling, significant bruising, significant pain, or evidence of infection, please call the office immediately.  FOR ADULT SURGERY PATIENTS: If you need something for pain relief you may take 1 extra strength Tylenol (acetaminophen) AND 2 Ibuprofen (200mg each) together every 4 hours as needed for pain. (do not take these if you are allergic to them or if you have a reason you should not take them.) Typically, you may only need pain medication for 1 to 3 days.     Due to recent changes in healthcare laws, you may see results of your pathology and/or laboratory studies on MyChart before the doctors have had a chance to review them. We understand that in some cases there may be results that are confusing or concerning to you. Please understand that not all results are received at the same time and often the doctors may need to interpret multiple results in order to provide you with the best plan of care or course of treatment. Therefore, we ask that you please give us 2 business days to thoroughly review all your results before contacting the office for clarification. Should  we see a critical lab result, you will be contacted sooner.   If You Need Anything After Your Visit  If you have any questions or concerns for your doctor, please call our main line at [phone] and press option 4 to reach your doctor's medical assistant. If no one answers, please leave a voicemail as directed and we will return your call as soon as possible. Messages left after 4 pm will be answered the following business day.   You may also send us a message via MyChart. We typically respond to MyChart messages within 1-2 business days.  For prescription refills, please ask your pharmacy to contact our office. Our fax number is [phone].  If you have an urgent issue when the clinic is closed that cannot wait until the next business day, you can page your doctor at the number below.    Please note that while we do our best to be available for urgent issues outside of office hours, we are not available 24/7.   If you have an urgent issue and are unable to reach us, you may choose to seek medical care at your doctor's office, retail clinic, urgent care center, or emergency room.  If you have a medical emergency, please immediately call 911 or go to the emergency department.  Pager Numbers  - Dr. Kowalski: [phone]  - Dr. Moye: [phone]  - Dr. Stewart: [phone]  In the event of inclement weather, please call our main line at   [phone] for an update on the status of any delays or closures.  Dermatology Medication Tips: Please keep the boxes that topical medications come in in order to help keep track of the instructions about where and how to use these. Pharmacies typically print the medication instructions only on the boxes and not directly on the medication tubes.   If your medication is too expensive, please contact our office at [phone] option 4 or send us a message through MyChart.   We are unable to tell what your co-pay for medications will be in  advance as this is different depending on your insurance coverage. However, we may be able to find a substitute medication at lower cost or fill out paperwork to get insurance to cover a needed medication.   If a prior authorization is required to get your medication covered by your insurance company, please allow us 1-2 business days to complete this process.  Drug prices often vary depending on where the prescription is filled and some pharmacies may offer cheaper prices.  The website www.goodrx.com contains coupons for medications through different pharmacies. The prices here do not account for what the cost may be with help from insurance (it may be cheaper with your insurance), but the website can give you the price if you did not use any insurance.  - You can print the associated coupon and take it with your prescription to the pharmacy.  - You may also stop by our office during regular business hours and pick up a GoodRx coupon card.  - If you need your prescription sent electronically to a different pharmacy, notify our office through [facility] MyChart or by phone at [phone] option 4.     Si Usted Necesita Algo Despus de Su Visita  Tambin puede enviarnos un mensaje a travs de MyChart. Por lo general respondemos a los mensajes de MyChart en el transcurso de 1 a 2 das hbiles.  Para renovar recetas, por favor pida a su farmacia que se ponga en contacto con nuestra oficina. Nuestro nmero de fax es el [phone].  Si tiene un asunto urgente cuando la clnica est cerrada y que no puede esperar hasta el siguiente da hbil, puede llamar/localizar a su doctor(a) al nmero que aparece a continuacin.   Por favor, tenga en cuenta que aunque hacemos todo lo posible para estar disponibles para asuntos urgentes fuera del horario de oficina, no estamos disponibles las 24 horas del da, los 7 das de la semana.   Si tiene un problema urgente y no puede comunicarse con nosotros, puede  optar por buscar atencin mdica  en el consultorio de su doctor(a), en una clnica privada, en un centro de atencin urgente o en una sala de emergencias.  Si tiene una emergencia mdica, por favor llame inmediatamente al 911 o vaya a la sala de emergencias.  Nmeros de bper  - Dr. Kowalski: [phone]  - Dra. Moye: [phone]  - Dra. Stewart: [phone]  En caso de inclemencias del tiempo, por favor llame a nuestra lnea principal al [phone] para una actualizacin sobre el estado de cualquier retraso o cierre.  Consejos para la medicacin en dermatologa: Por favor, guarde las cajas en las que vienen los medicamentos de uso tpico para ayudarle a seguir las instrucciones sobre dnde y cmo usarlos. Las farmacias generalmente imprimen las instrucciones del medicamento slo en las cajas y no directamente en los tubos del medicamento.   Si su medicamento es muy caro, por favor, pngase en contacto con   nuestra oficina llamando al [phone] y presione la opcin 4 o envenos un mensaje a travs de MyChart.   No podemos decirle cul ser su copago por los medicamentos por adelantado ya que esto es diferente dependiendo de la cobertura de su seguro. Sin embargo, es posible que podamos encontrar un medicamento sustituto a menor costo o llenar un formulario para que el seguro cubra el medicamento que se considera necesario.   Si se requiere una autorizacin previa para que su compaa de seguros cubra su medicamento, por favor permtanos de 1 a 2 das hbiles para completar este proceso.  Los precios de los medicamentos varan con frecuencia dependiendo del lugar de dnde se surte la receta y alguna farmacias pueden ofrecer precios ms baratos.  El sitio web www.goodrx.com tiene cupones para medicamentos de diferentes farmacias. Los precios aqu no tienen en cuenta lo que podra costar con la ayuda del seguro (puede ser ms barato con su seguro), pero el sitio web puede darle el  precio si no utiliz ningn seguro.  - Puede imprimir el cupn correspondiente y llevarlo con su receta a la farmacia.  - Tambin puede pasar por nuestra oficina durante el horario de atencin regular y recoger una tarjeta de cupones de GoodRx.  - Si necesita que su receta se enve electrnicamente a una farmacia diferente, informe a nuestra oficina a travs de MyChart de [place] o por telfono llamando al [phone] y presione la opcin 4.  

## 2022-07-07 NOTE — Progress Notes (Signed)
Follow-Up Visit   Subjective  [name] is a 81 y.o. female who presents for the following: FBSE (Hx BCC's, AK's ). The patient presents for Total-Body Skin Exam (TBSE) for skin cancer screening and mole check.  The patient has spots, moles and lesions to be evaluated, some may be new or changing.  The following portions of the chart were reviewed this encounter and updated as appropriate:   Tobacco  Allergies  Meds  Problems  Med Hx  Surg Hx  Fam Hx      Review of Systems:  No other skin or systemic complaints except as noted in HPI or Assessment and Plan.  Objective  Well appearing patient in no apparent distress; mood and affect are within normal limits.  A full examination was performed including scalp, head, eyes, ears, nose, lips, neck, chest, axillae, abdomen, back, buttocks, bilateral upper extremities, bilateral lower extremities, hands, feet, fingers, toes, fingernails, and toenails. All findings within normal limits unless otherwise noted below.  L lat zygoma x 1 Erythematous stuck-on, waxy papule or plaque  L mid forehead 0.15 cm yellow papule with prominent telangiectasia     Nasal dorsum x 1 Erythematous thin papules/macules with gritty scale.     Assessment & Plan  Inflamed seborrheic keratosis L lat zygoma x 1  Symptomatic, irritating, patient would like treated.  Prior to procedure, discussed risks of blister formation, small wound, skin dyspigmentation, or rare scar following cryotherapy. Recommend Vaseline ointment to treated areas while healing.   Destruction of lesion - L lat zygoma x 1 Complexity: simple   Destruction method: cryotherapy   Informed consent: discussed and consent obtained   Timeout:  patient name, date of birth, surgical site, and procedure verified Lesion destroyed using liquid nitrogen: Yes   Region frozen until ice ball extended beyond lesion: Yes   Outcome: patient tolerated procedure well with no complications    Post-procedure details: wound care instructions given    Neoplasm of uncertain behavior of skin L mid forehead  Skin / nail biopsy Type of biopsy: tangential   Informed consent: discussed and consent obtained   Timeout: patient name, date of birth, surgical site, and procedure verified   Procedure prep:  Patient was prepped and draped in usual sterile fashion Prep type:  Isopropyl alcohol Anesthesia: the lesion was anesthetized in a standard fashion   Anesthetic:  1% lidocaine w/ epinephrine 1-100,000 buffered w/ 8.4% NaHCO3 Instrument used: flexible razor blade   Hemostasis achieved with: pressure, aluminum chloride and electrodesiccation   Outcome: patient tolerated procedure well   Post-procedure details: sterile dressing applied and wound care instructions given   Dressing type: bandage and petrolatum    Specimen 1 - Surgical pathology Differential Diagnosis: D48.5 r/o BCC vs SH/cyst Check Margins: No  R/O BCC vs SH vs cyst  AK (actinic keratosis) Nasal dorsum x 1  Actinic keratoses are precancerous spots that appear secondary to cumulative UV radiation exposure/sun exposure over time. They are chronic with expected duration over 1 year. A portion of actinic keratoses will progress to squamous cell carcinoma of the skin. It is not possible to reliably predict which spots will progress to skin cancer and so treatment is recommended to prevent development of skin cancer.  Recommend daily broad spectrum sunscreen SPF 30+ to sun-exposed areas, reapply every 2 hours as needed.  Recommend staying in the shade or wearing long sleeves, sun glasses (UVA+UVB protection) and wide brim hats (4-inch brim around the entire circumference of the hat).  Call for new or changing lesions.  Prior to procedure, discussed risks of blister formation, small wound, skin dyspigmentation, or rare scar following cryotherapy. Recommend Vaseline ointment to treated areas while healing.   Destruction of  lesion - Nasal dorsum x 1 Complexity: simple   Destruction method: cryotherapy   Informed consent: discussed and consent obtained   Timeout:  patient name, date of birth, surgical site, and procedure verified Lesion destroyed using liquid nitrogen: Yes   Region frozen until ice ball extended beyond lesion: Yes   Outcome: patient tolerated procedure well with no complications   Post-procedure details: wound care instructions given    Lentigines - Scattered tan macules - Due to sun exposure - Benign-appearing, observe - Recommend daily broad spectrum sunscreen SPF 30+ to sun-exposed areas, reapply every 2 hours as needed. - Call for any changes  Seborrheic Keratoses - Stuck-on, waxy, tan-brown papules and/or plaques  - Benign-appearing - Discussed benign etiology and prognosis. - Observe - Call for any changes  Melanocytic Nevi - Tan-brown and/or pink-flesh-colored symmetric macules and papules - Benign appearing on exam today - Observation - Call clinic for new or changing moles - Recommend daily use of broad spectrum spf 30+ sunscreen to sun-exposed areas.   Hemangiomas - Red papules - Discussed benign nature - Observe - Call for any changes  Actinic Damage - Chronic condition, secondary to cumulative UV/sun exposure - diffuse scaly erythematous macules with underlying dyspigmentation - Recommend daily broad spectrum sunscreen SPF 30+ to sun-exposed areas, reapply every 2 hours as needed.  - Staying in the shade or wearing long sleeves, sun glasses (UVA+UVB protection) and wide brim hats (4-inch brim around the entire circumference of the hat) are also recommended for sun protection.  - Call for new or changing lesions.  Skin cancer screening performed today.  Return in about 1 year (around 07/08/2023) for TBSE.  Luther Redo, CMA, am acting as scribe for Forest Gleason, MD .  Documentation: I have reviewed the above documentation for accuracy and completeness, and I  agree with the above.  Forest Gleason, MD

## 2022-07-08 ENCOUNTER — Telehealth: Payer: Self-pay

## 2022-07-08 NOTE — Telephone Encounter (Signed)
-----   Message from Alfonso Patten, MD sent at 07/08/2022  4:55 PM EDT ----- Skin , left mid forehead MILIUM "superficial cyst" no treatment needed  MAs please call. Thank you!

## 2022-07-08 NOTE — Telephone Encounter (Signed)
Patient advised bx showed benign superficial cyst, JS

## 2022-07-17 ENCOUNTER — Encounter: Payer: Self-pay | Admitting: Dermatology

## 2022-08-16 DIAGNOSIS — I129 Hypertensive chronic kidney disease with stage 1 through stage 4 chronic kidney disease, or unspecified chronic kidney disease: Secondary | ICD-10-CM | POA: Diagnosis not present

## 2022-08-16 DIAGNOSIS — R8290 Unspecified abnormal findings in urine: Secondary | ICD-10-CM | POA: Diagnosis not present

## 2022-08-16 DIAGNOSIS — N1831 Chronic kidney disease, stage 3a: Secondary | ICD-10-CM | POA: Diagnosis not present

## 2022-08-17 ENCOUNTER — Other Ambulatory Visit: Payer: Self-pay | Admitting: Nephrology

## 2022-08-17 ENCOUNTER — Other Ambulatory Visit (HOSPITAL_COMMUNITY): Payer: Self-pay | Admitting: Nephrology

## 2022-08-17 DIAGNOSIS — N1831 Chronic kidney disease, stage 3a: Secondary | ICD-10-CM

## 2022-08-17 DIAGNOSIS — I129 Hypertensive chronic kidney disease with stage 1 through stage 4 chronic kidney disease, or unspecified chronic kidney disease: Secondary | ICD-10-CM

## 2022-08-18 ENCOUNTER — Ambulatory Visit (INDEPENDENT_AMBULATORY_CARE_PROVIDER_SITE_OTHER): Payer: Medicare Other

## 2022-08-18 DIAGNOSIS — Z0000 Encounter for general adult medical examination without abnormal findings: Secondary | ICD-10-CM

## 2022-08-18 NOTE — Progress Notes (Addendum)
Virtual Visit via Telephone Note  I connected with  [name] on 08/18/22 at  3:15 PM EDT by telephone and verified that I am speaking with the correct person using two identifiers.  Medicare Annual Wellness visit completed telephonically due to Covid-19 pandemic.   Persons participating in this call: This Health Coach and this patient.   Location: Patient: home Provider: office    I discussed the limitations, risks, security and privacy concerns of performing an evaluation and management service by telephone and the availability of in person appointments. The patient expressed understanding and agreed to proceed.  Unable to perform video visit due to video visit attempted and failed and/or patient does not have video capability.   Some vital signs may be absent or patient reported.   Willette Brace, LPN   Subjective:   [name] is a 81 y.o. female who presents for Medicare Annual (Subsequent) preventive examination.  Review of Systems     Cardiac Risk Factors include: advanced age (>71mn, >>20women);dyslipidemia;hypertension     Objective:    There were no vitals filed for this visit. There is no height or weight on file to calculate BMI.     08/18/2022    2:52 PM 08/13/2021   12:43 PM 06/25/2021    8:55 AM 08/12/2020   12:56 PM 12/03/2019    7:16 AM 08/10/2019   12:44 PM 07/31/2018   12:30 PM  Advanced Directives  Does Patient Have a Medical Advance Directive? Yes Yes Yes No No No No  Type of AParamed[place]of [place] will [place] will [place] will      Does patient want to make changes to medical advance directive?  No - Patient declined No - Patient declined  No - Patient declined No - Patient declined Yes (MAU/Ambulatory/Procedural Areas - Information given)  Copy of [place] Chart? No - copy requested No - copy requested Yes - validated most recent  copy scanned in chart (See row information)      Would patient like information on creating a medical advance directive?    No - Patient declined       Current Medications (verified) Outpatient Encounter Medications as of 08/18/2022  Medication Sig   aspirin EC 81 MG tablet Take 81 mg by mouth daily.   cholecalciferol (VITAMIN D) 1000 UNITS tablet Take 2,000 Units by mouth daily.    Cranberry 450 MG CAPS Take by mouth.   diltiazem (CARDIZEM CD) 120 MG 24 hr capsule TAKE 1 CAPSULE BY MOUTH DAILY   diphenhydramine-acetaminophen (TYLENOL PM) 25-500 MG TABS tablet Take 1 tablet by mouth at bedtime as needed.   estradiol (ESTRACE) 0.1 MG/GM vaginal cream Estrogen Cream Instruction Discard applicator Apply pea sized amount to tip of finger to urethra before bed. Wash hands well after application. Use Monday, Wednesday and Friday   glucosamine-chondroitin 500-400 MG tablet Take 1 tablet by mouth daily.    isosorbide mononitrate (IMDUR) 30 MG 24 hr tablet Take 1 tablet (30 mg total) by mouth daily.   levothyroxine (SYNTHROID) 50 MCG tablet TAKE 1 TABLET BY MOUTH  DAILY   mometasone (ELOCON) 0.1 % lotion SMARTSIG:2-4 Drop(s) In Ear(s) Every Night PRN   Multiple Vitamins-Minerals (MULTIVITAMIN WITH MINERALS) tablet Take 1 tablet by mouth daily.   NIACINAMIDE PO Take by mouth.   Omega-3 Fatty Acids (FISH OIL) 1000 MG CPDR Take by mouth daily.   pantoprazole (PROTONIX) 20 MG  tablet TAKE 1 TABLET BY MOUTH EVERY DAY   rosuvastatin (CRESTOR) 10 MG tablet TAKE 1 TABLET BY MOUTH  DAILY   telmisartan (MICARDIS) 40 MG tablet TAKE 1 TABLET BY MOUTH DAILY IN  THE MORNING FOR BLOOD PRESSURE   traZODone (DESYREL) 50 MG tablet TAKE 1/2 TO 1 TABLET BY MOUTH AT BEDTIME AS NEEDED FOR SLEEP   No facility-administered encounter medications on file as of 08/18/2022.    Allergies (verified) Prednisone   History: Past Medical History:  Diagnosis Date   Arthritis    knees   Basal cell carcinoma 10/23/2020   right  lateral ankle EDC done 12/18/20   Basal cell carcinoma 05/13/2021   right pretibia, [facility] 07/22/21   Basal cell carcinoma 12/03/2021   left chest, EDC 01/06/2022   Basal cell carcinoma (BCC) 05/13/2021   left forehead, Moh's 07/29/2021   Candida esophagitis ([place]) 06/30/2021   E. coli UTI 09/10/2015   Resistant to cipro .  Keflex sent x 5 days    History of dysplastic nevus 05/13/2021   right dorsal hand, mild   Hyperlipidemia    Hypothyroidism    PVC's (premature ventricular contractions)    a.  48-hour Holter in 3/17 demonstrated NSR, frequent PVCs totaling 5300 beats representing 3% burden, occasional PACs, short runs of SVT   Past Surgical History:  Procedure Laterality Date   ABDOMINAL HYSTERECTOMY     In 30's   BROW LIFT Bilateral 07/12/2017   Procedure: BLEPHAROPLASTY upper eyelid with excess skin;  Surgeon: Karle Starch, MD;  Location: [place];  Service: Ophthalmology;  Laterality: Bilateral;  MAC   COLONOSCOPY WITH PROPOFOL N/A 12/29/2015   Procedure: COLONOSCOPY WITH PROPOFOL;  Surgeon: Hulen Luster, MD;  Location:  [facility];  Service: Gastroenterology;  Laterality: N/A;   COLONOSCOPY WITH PROPOFOL N/A 12/03/2019   Procedure: COLONOSCOPY WITH BIOPSY;  Surgeon: [name], MD;  Location: [place];  Service: Endoscopy;  Laterality: N/A;   ESOPHAGOGASTRODUODENOSCOPY (EGD) WITH PROPOFOL N/A 06/25/2021   Procedure: ESOPHAGOGASTRODUODENOSCOPY (EGD) WITH PROPOFOL;  Surgeon: [name], MD;  Location: [place];  Service: Endoscopy;  Laterality: N/A;   POLYPECTOMY N/A 12/03/2019   Procedure: POLYPECTOMY;  Surgeon: [name], MD;  Location: [place];  Service: Endoscopy;  Laterality: N/A;   PTOSIS REPAIR Bilateral 07/12/2017   Procedure: ptosis repair resect ex;  Surgeon: Karle Starch, MD;  Location: [place];  Service: Ophthalmology;  Laterality: Bilateral;  Requests early   VAGINAL PROLAPSE REPAIR  2014   Dr. Enzo Bi    Family History  Problem Relation Age of Onset   Breast cancer Mother        5's   Heart attack Mother    Diabetes Mother    Stroke Father    Diabetes Father    Pancreatic cancer Sister    Cancer Sister        pancreatic   Cancer Sister        anal   Breast cancer Sister    Breast cancer Sister    Cancer Brother        liver   Diabetes Brother    Heart attack Brother    Diabetes Brother    Cancer Brother        stomach   Cancer Brother    Diabetes Brother    Colon cancer Neg Hx    Social History   Socioeconomic History   Marital status: Widowed    Spouse name: Not on file  Number of children: 2   Years of education: Not on file   Highest education level: Not on file  Occupational History   Occupation: Retired Education officer, museum  Tobacco Use   Smoking status: Former    Years: 1.00    Types: Cigarettes    Passive exposure: Past   Smokeless tobacco: Never   Tobacco comments:    Smoked x 1 year in her 20's  Vaping Use   Vaping Use: Never used  Substance and Sexual Activity   Alcohol use: No   Drug use: No   Sexual activity: Never  Other Topics Concern   Not on file  Social History Narrative   Daily Caffeine Use:  One cup coffee in am    Regular Exercise -  5 days a week x 1 hour - gym, aerobic and wt training         Social Determinants of Health   Financial Resource Strain: Low Risk  (08/18/2022)   Overall Financial Resource Strain (CARDIA)    Difficulty of Paying Living Expenses: Not hard at all  Food Insecurity: No Food Insecurity (08/18/2022)   Hunger Vital Sign    Worried About Running Out of Food in the Last Year: Never true    Ran Out of Food in the Last Year: Never true  Transportation Needs: No Transportation Needs (08/18/2022)   PRAPARE - Hydrologist (Medical): No    Lack of Transportation (Non-Medical): No  Physical Activity: Sufficiently Active (08/18/2022)   Exercise Vital Sign    Days of Exercise per Week: 4  days    Minutes of Exercise per Session: 50 min  Stress: No Stress Concern Present (08/18/2022)   [place]    Feeling of Stress : Not at all  Social Connections: Moderately Isolated (08/18/2022)   Social Connection and Isolation Panel [NHANES]    Frequency of Communication with Friends and Family: More than three times a week    Frequency of Social Gatherings with Friends and Family: More than three times a week    Attends Religious Services: More than 4 times per year    Active Member of [employer] or Organizations: No    Attends Archivist Meetings: Never    Marital Status: Widowed    Tobacco Counseling Counseling given: Not Answered Tobacco comments: Smoked x 1 year in her 20's   Clinical Intake:  Pre-visit preparation completed: Yes  Pain : No/denies pain     Diabetes: No  How often do you need to have someone help you when you read instructions, pamphlets, or other written materials from your doctor or pharmacy?: 1 - Never  Diabetic?no  Interpreter Needed?: No  Information entered by :: Charlott Rakes, LPN   Activities of Daily Living    08/18/2022    2:53 PM  In your present state of health, do you have any difficulty performing the following activities:  Hearing? 0  Vision? 0  Difficulty concentrating or making decisions? 0  Walking or climbing stairs? 0  Dressing or bathing? 0  Doing errands, shopping? 0  Preparing Food and eating ? N  Using the Toilet? N  In the past six months, have you accidently leaked urine? N  Do you have problems with loss of bowel control? N  Managing your Medications? N  Managing your Finances? N  Housekeeping or managing your Housekeeping? N    Patient Care Team: [name], MD as  PCP - General (Internal Medicine) Rockey Situ Kathlene November, MD as PCP - Cardiology (Cardiology)  Indicate any recent Medical Services you may have received from other than  [facility] providers in the past year (date may be approximate).     Assessment:   This is a routine wellness examination for [name].  Hearing/Vision screen Hearing Screening - Comments:: Pt denies any hearing issues  Vision Screening - Comments:: Pt follows up with Dr Edison Pace for annual eye exams   Dietary issues and exercise activities discussed: Current Exercise Habits: Home exercise routine, Time (Minutes): 45, Frequency (Times/Week): 4, Weekly Exercise (Minutes/Week): 180   Goals Addressed             This Visit's Progress    Patient Stated       Maintain health        Depression Screen    08/18/2022    2:51 PM 06/30/2022   10:37 AM 12/30/2021   10:37 AM 11/02/2021    1:19 PM 09/25/2021   10:17 AM 08/13/2021   12:41 PM 04/22/2021    4:12 PM  PHQ 2/9 Scores  PHQ - 2 Score 0 0 0 0 0 0 0    Fall Risk    08/18/2022    2:53 PM 06/30/2022   10:37 AM 12/30/2021   10:37 AM 11/02/2021    1:19 PM 09/25/2021   10:17 AM  Fall Risk   Falls in the past year? 0 0 0 0 0  Number falls in past yr: 0      Injury with Fall? 0      Risk for fall due to : No Fall Risks;Impaired vision No Fall Risks No Fall Risks No Fall Risks No Fall Risks  Follow up Falls prevention discussed Falls evaluation completed Falls evaluation completed Falls evaluation completed Falls evaluation completed    [name]:  Any stairs in or around the home? Yes  If so, are there any without handrails? No  Home free of loose throw rugs in walkways, pet beds, electrical cords, etc? Yes  Adequate lighting in your home to reduce risk of falls? Yes   ASSISTIVE DEVICES UTILIZED TO PREVENT FALLS:  Life alert? Yes  Use of a cane, walker or w/c? No  Grab bars in the bathroom? Yes  Shower chair or bench in shower? Yes  Elevated toilet seat or a handicapped toilet? Yes   TIMED UP AND GO:  Was the test performed? No .  Cognitive Function:    07/31/2018   10:37 AM 07/29/2017   10:21 AM  07/27/2016   10:53 AM 07/25/2015   10:45 AM  MMSE - Mini Mental State Exam  Orientation to time _0 Orientation to Place _1 Registration _2 Attention/ Calculation _3 Recall _4 Language- name 2 objects _5 Language- repeat _6 Language- follow 3 step command _7 Language- read & follow direction _8 Write a sentence _9 Copy design _10 Total score _11 08/18/2022    2:55 PM 08/10/2019   12:54 PM  6CIT Screen  What Year? 0 points 0 points  What month? 0 points 0 points  What time? 0 points 0 points  Count back from 20  0 points 0 points  Months in reverse 0 points 0 points  Repeat phrase 0 points 0 points  Total Score 0 points 0 points    Immunizations Immunization History  Administered Date(s) Administered   Influenza Split 08/18/2011, 08/04/2012   Influenza Whole 09/10/2013   Influenza, High Dose Seasonal PF 07/25/2015, 07/27/2016, 07/29/2017, 07/31/2018, 08/09/2019, 09/23/2021   Influenza,inj,Quad PF,6+ Mos 09/06/2014   Influenza,inj,quad, With Preservative 07/25/2018   Influenza-Unspecified 08/24/2020   PFIZER(Purple Top)SARS-COV-2 Vaccination 12/25/2019, 01/15/2020, 09/25/2020   Pfizer Covid-19 Vaccine Bivalent Booster 73yr & up 09/23/2021   Pneumococcal Conjugate-13 12/07/2013   Pneumococcal Polysaccharide-23 12/18/2007, 03/06/2018   Tdap 11/20/2012, 09/29/2016   Zoster Recombinat (Shingrix) 09/24/2018, 12/06/2018   Zoster, Live 12/18/2007    TDAP status: Up to date  Flu Vaccine status: Due, Education has been provided regarding the importance of this vaccine. Advised may receive this vaccine at local pharmacy or Health Dept. Aware to provide a copy of the vaccination record if obtained from local pharmacy or Health Dept. Verbalized acceptance and understanding.  Pneumococcal vaccine status: Up to date  Covid-19 vaccine status: Completed vaccines  Qualifies for Shingles Vaccine? Yes    Zostavax completed Yes   Shingrix Completed?: Yes  Screening Tests Health Maintenance  Topic Date Due   COVID-19 Vaccine (5 - Pfizer risk series) 11/18/2021   INFLUENZA VACCINE  06/22/2022   MAMMOGRAM  04/06/2023   TETANUS/TDAP  09/29/2026   Pneumonia Vaccine 81 Years old  Completed   DEXA SCAN  Completed   Zoster Vaccines- Shingrix  Completed   HPV VACCINES  Aged Out    Health Maintenance  Health Maintenance Due  Topic Date Due   COVID-19 Vaccine (5 - Pfizer risk series) 11/18/2021   INFLUENZA VACCINE  06/22/2022    Colonoscopy as directed   Mammogram status: Completed 04/05/22. Repeat every year  Bone Density status: Completed 05/23/17. Results reflect: Bone density results: NORMAL. Repeat every 2 years.   Additional Screening:   Vision Screening: Recommended annual ophthalmology exams for early detection of glaucoma and other disorders of the eye. Is the patient up to date with their annual eye exam?  Yes  Who is the provider or what is the name of the office in which the patient attends annual eye exams? Dr KEdison PaceIf pt is not established with a provider, would they like to be referred to a provider to establish care? No .   Dental Screening: Recommended annual dental exams for proper oral hygiene  Community Resource Referral / Chronic Care Management: CRR required this visit?  No   CCM required this visit?  No      Plan:     I have personally reviewed and noted the following in the patient's chart:   Medical and social history Use of alcohol, tobacco or illicit drugs  Current medications and supplements including opioid prescriptions. Patient is not currently taking opioid prescriptions. Functional ability and status Nutritional status Physical activity Advanced directives List of other physicians Hospitalizations, surgeries, and ER visits in previous 12 months Vitals Screenings to include cognitive, depression, and falls Referrals and  appointments  In addition, I have reviewed and discussed with patient certain preventive protocols, quality metrics, and best practice recommendations. A written personalized care plan for preventive services as well as general preventive health recommendations were provided to patient.     TWillette Brace LPN   [id number]  Nurse Notes: none    I have reviewed the above information and agree with above.  Deborra Medina, MD

## 2022-08-18 NOTE — Patient Instructions (Signed)
[name] , Thank you for taking time to come for your Medicare Wellness Visit. I appreciate your ongoing commitment to your health goals. Please review the following plan we discussed and let me know if I can assist you in the future.   These are the goals we discussed:  Goals      Follow up with pcp as needed     Low carb diet        This is a list of the screening recommended for you and due dates:  Health Maintenance  Topic Date Due   COVID-19 Vaccine (5 - Pfizer risk series) 11/18/2021   Flu Shot  06/22/2022   Mammogram  04/06/2023   Tetanus Vaccine  09/29/2026   Pneumonia Vaccine  Completed   DEXA scan (bone density measurement)  Completed   Zoster (Shingles) Vaccine  Completed   HPV Vaccine  Aged Out    Advanced directives: Please bring a copy of your health care power of attorney and living will to the office at your convenience.  Conditions/risks identified: maintain health  Next appointment: Follow up in one year for your annual wellness visit    Preventive Care 65 Years and Older, Female Preventive care refers to lifestyle choices and visits with your health care provider that can promote health and wellness. What does preventive care include? A yearly physical exam. This is also called an annual well check. Dental exams once or twice a year. Routine eye exams. Ask your health care provider how often you should have your eyes checked. Personal lifestyle choices, including: Daily care of your teeth and gums. Regular physical activity. Eating a healthy diet. Avoiding tobacco and drug use. Limiting alcohol use. Practicing safe sex. Taking low-dose aspirin every day. Taking vitamin and mineral supplements as recommended by your health care provider. What happens during an annual well check? The services and screenings done by your health care provider during your annual well check will depend on your age, overall health, lifestyle risk factors, and family  history of disease. Counseling  Your health care provider may ask you questions about your: Alcohol use. Tobacco use. Drug use. Emotional well-being. Home and relationship well-being. Sexual activity. Eating habits. History of falls. Memory and ability to understand (cognition). Work and work Statistician. Reproductive health. Screening  You may have the following tests or measurements: Height, weight, and BMI. Blood pressure. Lipid and cholesterol levels. These may be checked every 5 years, or more frequently if you are over 66 years old. Skin check. Lung cancer screening. You may have this screening every year starting at age 52 if you have a 30-pack-year history of smoking and currently smoke or have quit within the past 15 years. Fecal occult blood test (FOBT) of the stool. You may have this test every year starting at age 58. Flexible sigmoidoscopy or colonoscopy. You may have a sigmoidoscopy every 5 years or a colonoscopy every 10 years starting at age 65. Hepatitis C blood test. Hepatitis B blood test. Sexually transmitted disease (STD) testing. Diabetes screening. This is done by checking your blood sugar (glucose) after you have not eaten for a while (fasting). You may have this done every 1-3 years. Bone density scan. This is done to screen for osteoporosis. You may have this done starting at age 45. Mammogram. This may be done every 1-2 years. Talk to your health care provider about how often you should have regular mammograms. Talk with your health care provider about your test results, treatment options,  and if necessary, the need for more tests. Vaccines  Your health care provider may recommend certain vaccines, such as: Influenza vaccine. This is recommended every year. Tetanus, diphtheria, and acellular pertussis (Tdap, Td) vaccine. You may need a Td booster every 10 years. Zoster vaccine. You may need this after age 60. Pneumococcal 13-valent conjugate (PCV13)  vaccine. One dose is recommended after age 46. Pneumococcal polysaccharide (PPSV23) vaccine. One dose is recommended after age 105. Talk to your health care provider about which screenings and vaccines you need and how often you need them. This information is not intended to replace advice given to you by your health care provider. Make sure you discuss any questions you have with your health care provider. Document Released: 12/05/2015 Document Revised: 07/28/2016 Document Reviewed: 09/09/2015 Elsevier Interactive Patient Education  2017 [place] Prevention in the Home Falls can cause injuries. They can happen to people of all ages. There are many things you can do to make your home safe and to help prevent falls. What can I do on the outside of my home? Regularly fix the edges of walkways and driveways and fix any cracks. Remove anything that might make you trip as you walk through a door, such as a raised step or threshold. Trim any bushes or trees on the path to your home. Use bright outdoor lighting. Clear any walking paths of anything that might make someone trip, such as rocks or tools. Regularly check to see if handrails are loose or broken. Make sure that both sides of any steps have handrails. Any raised decks and porches should have guardrails on the edges. Have any leaves, snow, or ice cleared regularly. Use sand or salt on walking paths during winter. Clean up any spills in your garage right away. This includes oil or grease spills. What can I do in the bathroom? Use night lights. Install grab bars by the toilet and in the tub and shower. Do not use towel bars as grab bars. Use non-skid mats or decals in the tub or shower. If you need to sit down in the shower, use a plastic, non-slip stool. Keep the floor dry. Clean up any water that spills on the floor as soon as it happens. Remove soap buildup in the tub or shower regularly. Attach bath mats securely with  double-sided non-slip rug tape. Do not have throw rugs and other things on the floor that can make you trip. What can I do in the bedroom? Use night lights. Make sure that you have a light by your bed that is easy to reach. Do not use any sheets or blankets that are too big for your bed. They should not hang down onto the floor. Have a firm chair that has side arms. You can use this for support while you get dressed. Do not have throw rugs and other things on the floor that can make you trip. What can I do in the kitchen? Clean up any spills right away. Avoid walking on wet floors. Keep items that you use a lot in easy-to-reach places. If you need to reach something above you, use a strong step stool that has a grab bar. Keep electrical cords out of the way. Do not use floor polish or wax that makes floors slippery. If you must use wax, use non-skid floor wax. Do not have throw rugs and other things on the floor that can make you trip. What can I do with my stairs? Do not leave  any items on the stairs. Make sure that there are handrails on both sides of the stairs and use them. Fix handrails that are broken or loose. Make sure that handrails are as long as the stairways. Check any carpeting to make sure that it is firmly attached to the stairs. Fix any carpet that is loose or worn. Avoid having throw rugs at the top or bottom of the stairs. If you do have throw rugs, attach them to the floor with carpet tape. Make sure that you have a light switch at the top of the stairs and the bottom of the stairs. If you do not have them, ask someone to add them for you. What else can I do to help prevent falls? Wear shoes that: Do not have high heels. Have rubber bottoms. Are comfortable and fit you well. Are closed at the toe. Do not wear sandals. If you use a stepladder: Make sure that it is fully opened. Do not climb a closed stepladder. Make sure that both sides of the stepladder are locked  into place. Ask someone to hold it for you, if possible. Clearly mark and make sure that you can see: Any grab bars or handrails. First and last steps. Where the edge of each step is. Use tools that help you move around (mobility aids) if they are needed. These include: Canes. Walkers. Scooters. Crutches. Turn on the lights when you go into a dark area. Replace any light bulbs as soon as they burn out. Set up your furniture so you have a clear path. Avoid moving your furniture around. If any of your floors are uneven, fix them. If there are any pets around you, be aware of where they are. Review your medicines with your doctor. Some medicines can make you feel dizzy. This can increase your chance of falling. Ask your doctor what other things that you can do to help prevent falls. This information is not intended to replace advice given to you by your health care provider. Make sure you discuss any questions you have with your health care provider. Document Released: 09/04/2009 Document Revised: 04/15/2016 Document Reviewed: 12/13/2014 Elsevier Interactive Patient Education  2017 [employer].

## 2022-08-25 ENCOUNTER — Ambulatory Visit
Admission: RE | Admit: 2022-08-25 | Discharge: 2022-08-25 | Disposition: A | Discharge status: Home / Self Care | Payer: Medicare Other | Source: Ambulatory Visit | Attending: Nephrology | Admitting: Nephrology

## 2022-08-25 DIAGNOSIS — N1831 Chronic kidney disease, stage 3a: Secondary | ICD-10-CM | POA: Diagnosis not present

## 2022-08-25 DIAGNOSIS — N1330 Unspecified hydronephrosis: Secondary | ICD-10-CM | POA: Diagnosis not present

## 2022-08-25 DIAGNOSIS — I129 Hypertensive chronic kidney disease with stage 1 through stage 4 chronic kidney disease, or unspecified chronic kidney disease: Secondary | ICD-10-CM | POA: Diagnosis not present

## 2022-08-25 DIAGNOSIS — N189 Chronic kidney disease, unspecified: Secondary | ICD-10-CM | POA: Diagnosis not present

## 2022-09-02 DIAGNOSIS — H43813 Vitreous degeneration, bilateral: Secondary | ICD-10-CM | POA: Diagnosis not present

## 2022-09-07 DIAGNOSIS — N1831 Chronic kidney disease, stage 3a: Secondary | ICD-10-CM | POA: Diagnosis not present

## 2022-09-07 DIAGNOSIS — I129 Hypertensive chronic kidney disease with stage 1 through stage 4 chronic kidney disease, or unspecified chronic kidney disease: Secondary | ICD-10-CM | POA: Diagnosis not present

## 2022-09-07 DIAGNOSIS — R8290 Unspecified abnormal findings in urine: Secondary | ICD-10-CM | POA: Diagnosis not present

## 2022-11-03 DIAGNOSIS — Q6671 Congenital pes cavus, right foot: Secondary | ICD-10-CM | POA: Diagnosis not present

## 2022-11-03 DIAGNOSIS — G629 Polyneuropathy, unspecified: Secondary | ICD-10-CM | POA: Diagnosis not present

## 2022-11-03 DIAGNOSIS — M2041 Other hammer toe(s) (acquired), right foot: Secondary | ICD-10-CM | POA: Diagnosis not present

## 2022-11-03 DIAGNOSIS — Q6672 Congenital pes cavus, left foot: Secondary | ICD-10-CM | POA: Diagnosis not present

## 2022-11-03 DIAGNOSIS — M2042 Other hammer toe(s) (acquired), left foot: Secondary | ICD-10-CM | POA: Diagnosis not present

## 2022-11-03 DIAGNOSIS — M216X1 Other acquired deformities of right foot: Secondary | ICD-10-CM | POA: Diagnosis not present

## 2022-11-03 DIAGNOSIS — L84 Corns and callosities: Secondary | ICD-10-CM | POA: Diagnosis not present

## 2022-11-03 DIAGNOSIS — M216X2 Other acquired deformities of left foot: Secondary | ICD-10-CM | POA: Diagnosis not present

## 2022-11-03 DIAGNOSIS — L851 Acquired keratosis [keratoderma] palmaris et plantaris: Secondary | ICD-10-CM | POA: Diagnosis not present

## 2022-11-10 ENCOUNTER — Other Ambulatory Visit: Payer: Self-pay | Admitting: Cardiovascular Disease

## 2022-11-10 NOTE — Telephone Encounter (Signed)
Please contact pt for future appointment. 

## 2022-11-11 ENCOUNTER — Other Ambulatory Visit: Payer: Self-pay

## 2022-11-11 ENCOUNTER — Emergency Department
Admission: EM | Admit: 2022-11-11 | Discharge: 2022-11-11 | Disposition: A | Discharge status: Home / Self Care | Payer: Medicare Other | Attending: Emergency Medicine | Admitting: Emergency Medicine

## 2022-11-11 ENCOUNTER — Emergency Department: Payer: Medicare Other

## 2022-11-11 DIAGNOSIS — R404 Transient alteration of awareness: Secondary | ICD-10-CM | POA: Diagnosis not present

## 2022-11-11 DIAGNOSIS — Z23 Encounter for immunization: Secondary | ICD-10-CM | POA: Insufficient documentation

## 2022-11-11 DIAGNOSIS — Z743 Need for continuous supervision: Secondary | ICD-10-CM | POA: Diagnosis not present

## 2022-11-11 DIAGNOSIS — E039 Hypothyroidism, unspecified: Secondary | ICD-10-CM | POA: Diagnosis not present

## 2022-11-11 DIAGNOSIS — S0181XA Laceration without foreign body of other part of head, initial encounter: Secondary | ICD-10-CM

## 2022-11-11 DIAGNOSIS — W19XXXA Unspecified fall, initial encounter: Secondary | ICD-10-CM | POA: Diagnosis not present

## 2022-11-11 DIAGNOSIS — Z853 Personal history of malignant neoplasm of breast: Secondary | ICD-10-CM | POA: Diagnosis not present

## 2022-11-11 DIAGNOSIS — M4312 Spondylolisthesis, cervical region: Secondary | ICD-10-CM | POA: Diagnosis not present

## 2022-11-11 DIAGNOSIS — S0990XA Unspecified injury of head, initial encounter: Secondary | ICD-10-CM | POA: Diagnosis not present

## 2022-11-11 DIAGNOSIS — J3489 Other specified disorders of nose and nasal sinuses: Secondary | ICD-10-CM | POA: Diagnosis not present

## 2022-11-11 DIAGNOSIS — R55 Syncope and collapse: Secondary | ICD-10-CM | POA: Diagnosis not present

## 2022-11-11 DIAGNOSIS — X509XXA Other and unspecified overexertion or strenuous movements or postures, initial encounter: Secondary | ICD-10-CM | POA: Diagnosis not present

## 2022-11-11 DIAGNOSIS — I10 Essential (primary) hypertension: Secondary | ICD-10-CM | POA: Diagnosis not present

## 2022-11-11 DIAGNOSIS — R6889 Other general symptoms and signs: Secondary | ICD-10-CM | POA: Diagnosis not present

## 2022-11-11 LAB — CBC
HCT: 37 % (ref 36.0–46.0)
Hemoglobin: 12.2 g/dL (ref 12.0–15.0)
MCH: 31.8 pg (ref 26.0–34.0)
MCHC: 33 g/dL (ref 30.0–36.0)
MCV: 96.4 fL (ref 80.0–100.0)
Platelets: 222 10*3/uL (ref 150–400)
RBC: 3.84 MIL/uL — ABNORMAL LOW (ref 3.87–5.11)
RDW: 12.4 % (ref 11.5–15.5)
WBC: 8.1 10*3/uL (ref 4.0–10.5)
nRBC: 0 % (ref 0.0–0.2)

## 2022-11-11 LAB — BASIC METABOLIC PANEL
Anion gap: 10 (ref 5–15)
BUN: 15 mg/dL (ref 8–23)
CO2: 26 mmol/L (ref 22–32)
Calcium: 9.2 mg/dL (ref 8.9–10.3)
Chloride: 99 mmol/L (ref 98–111)
Creatinine, Ser: 1.12 mg/dL — ABNORMAL HIGH (ref 0.44–1.00)
GFR, Estimated: 49 mL/min — ABNORMAL LOW (ref >60)
Glucose, Bld: 127 mg/dL — ABNORMAL HIGH (ref 70–99)
Potassium: 4.4 mmol/L (ref 3.5–5.1)
Sodium: 135 mmol/L (ref 135–145)

## 2022-11-11 MED ORDER — LACTATED RINGERS IV BOLUS
1000.0000 mL | Freq: Once | INTRAVENOUS | Status: AC
  Administered 2022-11-11: 1000 mL via INTRAVENOUS

## 2022-11-11 MED ORDER — ACETAMINOPHEN 500 MG PO TABS
1000.0000 mg | ORAL_TABLET | Freq: Once | ORAL | Status: AC
  Administered 2022-11-11: 1000 mg via ORAL
  Filled 2022-11-11: qty 2

## 2022-11-11 MED ORDER — TETANUS-DIPHTH-ACELL PERTUSSIS 5-2.5-18.5 LF-MCG/0.5 IM SUSY
0.5000 mL | PREFILLED_SYRINGE | Freq: Once | INTRAMUSCULAR | Status: AC
  Administered 2022-11-11: 0.5 mL via INTRAMUSCULAR
  Filled 2022-11-11: qty 0.5

## 2022-11-11 MED ORDER — LIDOCAINE-EPINEPHRINE 2 %-1:100000 IJ SOLN
20.0000 mL | Freq: Once | INTRAMUSCULAR | Status: AC
  Administered 2022-11-11: 20 mL
  Filled 2022-11-11: qty 1

## 2022-11-11 NOTE — Discharge Instructions (Signed)
Please have your stitches removed in about 7 days.  Please monitor your blood pressure at home if it is reading consistently less than 122 systolic then follow-up with your primary doctor you may need to have your blood pressure medications adjusted.  Please make sure you are staying hydrated and when you stand up that you go up slowly and if you are feeling lightheaded sit back down.

## 2022-11-11 NOTE — ED Triage Notes (Signed)
Pt to er via ems, per ems pt was in an exercise class and was getting dizzy so she went to sit down, states that then when she got up she got dizzy again and fell, pt doesn't believe that she lost consciousness.  Pt awake and oriented, pt has aprox 2 inch lac to her forehead.

## 2022-11-11 NOTE — ED Provider Notes (Signed)
[facility] Provider Note    Event Date/Time   First MD Initiated Contact with Patient 11/11/22 1157     (approximate)   History   Loss of Consciousness   HPI  [name] is a 81 y.o. female past medical history of A-fib not anticoagulated, hypothyroidism, hyperlipidemia who presents with syncope.  Patient was at her exercise class which she goes to 4 days a week was towards the end of the class and she had been standing up the whole time when she started to feel lightheaded like she was going to pass out.  She sat down which she frequently does when she feels like this and thought she was going to be okay.  Then got up walked and felt lightheaded again and fell to the ground hitting her head.  She is not sure whether she actually lost consciousness.  She denies any chest pain palpitations or dyspnea prior.  She denies neck pain numbness tingling weakness.  Has not had any nausea vomiting diarrhea.  Has had some upper respiratory congestion this week and mild sore throat but this is improving.  She had cereal and coffee prior to the workup.  Says it was not hot in the room she was actually cold during the class.  Tells me she has had frequent presyncope gets dizzy/lightheaded quite a lot especially with standing.     Past Medical History:  Diagnosis Date   Arthritis    knees   Basal cell carcinoma 10/23/2020   right lateral ankle EDC done 12/18/20   Basal cell carcinoma 05/13/2021   right pretibia, [facility] 07/22/21   Basal cell carcinoma 12/03/2021   left chest, EDC 01/06/2022   Basal cell carcinoma (BCC) 05/13/2021   left forehead, Moh's 07/29/2021   Candida esophagitis ([place]) 06/30/2021   E. coli UTI 09/10/2015   Resistant to cipro .  Keflex sent x 5 days    History of dysplastic nevus 05/13/2021   right dorsal hand, mild   Hyperlipidemia    Hypothyroidism    PVC's (premature ventricular contractions)    a.  48-hour Holter in 3/17 demonstrated NSR,  frequent PVCs totaling 5300 beats representing 3% burden, occasional PACs, short runs of SVT    Patient Active Problem List   Diagnosis Date Noted   Neuropathy 12/30/2021   Blood in stool    Disorder of upper esophageal sphincter    Black tarry stools 04/22/2021   Diarrhea 04/22/2021   Insomnia disorder 09/25/2020   Elimination disorder with fecal symptoms 03/22/2020   Essential hypertension 03/22/2020   Polyp of ascending colon    Prediabetes 09/05/2017   Raynaud disease 09/05/2017   Abdominal aortic atherosclerosis ([place]) 03/06/2017   Encounter for preventive health examination 09/05/2016   Other fatigue 09/05/2016   PVC's (premature ventricular contractions) 11/04/2015   Atypical chest pain 11/03/2015   Tinnitus 09/17/2015   Family history of colon cancer 09/17/2015   Low back pain 06/24/2015   Dysuria 03/08/2015   Medicare annual wellness visit, subsequent 12/07/2013   Left carotid bruit 12/07/2013   Varicose veins of lower extremities with other complications 16/08/9603   Vitamin D deficiency 08/04/2012   Familial hyperlipidemia 11/17/2011   Acquired hypothyroidism 11/17/2011     Physical Exam  Triage Vital Signs: ED Triage Vitals  Enc Vitals Group     BP 11/11/22 1149 (!) 123/45     Pulse Rate 11/11/22 1149 71     Resp 11/11/22 1149 17  Temp 11/11/22 1149 98.2 F (36.8 C)     Temp Source 11/11/22 1149 Oral     SpO2 11/11/22 1149 93 %     Weight 11/11/22 1153 135 lb (61.2 kg)     Height 11/11/22 1153 '5\' 7"'$  (1.702 m)     Head Circumference --      Peak Flow --      Pain Score 11/11/22 1152 5     Pain Loc --      Pain Edu? --      Excl. in [place]? --     Most recent vital signs: Vitals:   11/11/22 1149  BP: (!) 123/45  Pulse: 71  Resp: 17  Temp: 98.2 F (36.8 C)  SpO2: 93%     General: Awake, no distress.  CV:  Good peripheral perfusion.  Resp:  Normal effort.  Breath sounds bilaterally equal symmetric, no murmurs Abd:  No distention.  Neuro:              Awake, Alert, Oriented x 3  Other:  Approximately 4 cm laceration on the right forehead Mild bruising over the nasal bridge but no septal deviation  No C-spine tenderness   ED Results / Procedures / Treatments  Labs (all labs ordered are listed, but only abnormal results are displayed) Labs Reviewed  CBC - Abnormal; Notable for the following components:      Result Value   RBC 3.84 (*)    All other components within normal limits  BASIC METABOLIC PANEL - Abnormal; Notable for the following components:   Glucose, Bld 127 (*)    Creatinine, Ser 1.12 (*)    GFR, Estimated 49 (*)    All other components within normal limits     EKG  EKG reviewed interpreted myself shows normal sinus rhythm normal axis normal intervals no acute ischemic changes   RADIOLOGY I reviewed and interpreted the CT scan of the brain which does not show any acute intracranial process    PROCEDURES:  Critical Care performed: No  ..Laceration Repair  Date/Time: 11/11/2022 1:30 PM  Performed by: Rada Hay, MD Authorized by: Rada Hay, MD   Consent:    Consent obtained:  Verbal   Risks discussed:  Pain Universal protocol:    Patient identity confirmed:  Verbally with patient Laceration details:    Location:  Face   Face location:  Forehead   Length (cm):  4 Pre-procedure details:    Preparation:  Patient was prepped and draped in usual sterile fashion Exploration:    Contaminated: no   Treatment:    Area cleansed with:  Saline   Amount of cleaning:  Standard   Irrigation solution:  Sterile saline   Irrigation method:  Syringe   Debridement:  None   Undermining:  None Skin repair:    Repair method:  Sutures   Suture size:  5-0   Suture material:  Nylon   Suture technique:  Simple interrupted   Number of sutures:  4 Approximation:    Approximation:  Close Repair type:    Repair type:  Simple Post-procedure details:    Dressing:  Open (no dressing)    Procedure completion:  Tolerated   The patient is on the cardiac monitor to evaluate for evidence of arrhythmia and/or significant heart rate changes.   MEDICATIONS ORDERED IN ED: Medications  lidocaine-EPINEPHrine (XYLOCAINE W/EPI) 2 %-1:100000 (with pres) injection 20 mL (has no administration in time range)  lactated ringers bolus 1,000 mL (1,000  mLs Intravenous New Bag/Given 11/11/22 1310)  Tdap (BOOSTRIX) injection 0.5 mL (0.5 mLs Intramuscular Given 11/11/22 1311)  acetaminophen (TYLENOL) tablet 1,000 mg (1,000 mg Oral Given 11/11/22 1307)     IMPRESSION / MDM / ASSESSMENT AND PLAN / ED COURSE  I reviewed the triage vital signs and the nursing notes.                              Patient's presentation is most consistent with acute complicated illness / injury requiring diagnostic workup.  Differential diagnosis includes, but is not limited to, vasovagal syncope, orthostatic syncope, anemia, dehydration, medication side effect, less likely arrhythmia valvular disease  Patient is a 81 year old female presents after a syncopal episode.  She was at an exercise class when she felt lightheaded initially sat down and felt improved and then when she stood up she either had a true syncopal event or presyncope causing her to fall did not hit her head.  She does have a laceration to her head otherwise no signs of trauma.  Tells me she has frequent presyncope and lightheadedness with standing.  She is on Imdur and telmisartan and diltiazem the latter which is for A-fib.  She is not anticoagulated.  Based on her history I strongly suspect that this is either vasovagal versus orthostatic likely orthostatic.  Patient's initial BP is on the lower side, today her diastolic.  Will give a bolus of fluid check basic labs including CBC BMP EKG  Labs are reassuring including CBC BMP.  EKG was sinus rhythm without concerning features to suggest cardiac cause of syncope.  Patient's blood pressure did rise  with some fluid, to 1 days.  Will hold off on making any medication adjustment at this time but I did recommend she keep checking her blood pressure at home and if consistently on the low end will need to follow-up with primary care.  We discussed staying hydrated     FINAL CLINICAL IMPRESSION(S) / ED DIAGNOSES   Final diagnoses:  Syncope, unspecified syncope type  Facial laceration, initial encounter     Rx / DC Orders   ED Discharge Orders     None        Note:  This document was prepared using Dragon voice recognition software and may include unintentional dictation errors.   Rada Hay, MD 11/11/22 1332

## 2022-11-12 ENCOUNTER — Encounter: Payer: Self-pay | Admitting: Internal Medicine

## 2022-11-18 ENCOUNTER — Ambulatory Visit (INDEPENDENT_AMBULATORY_CARE_PROVIDER_SITE_OTHER): Payer: Medicare Other | Admitting: Nurse Practitioner

## 2022-11-18 ENCOUNTER — Encounter: Payer: Self-pay | Admitting: Nurse Practitioner

## 2022-11-18 VITALS — BP 120/60 | HR 69 | Temp 98.0°F | Ht 67.0 in | Wt 137.8 lb

## 2022-11-18 DIAGNOSIS — I10 Essential (primary) hypertension: Secondary | ICD-10-CM

## 2022-11-18 DIAGNOSIS — Z4802 Encounter for removal of sutures: Secondary | ICD-10-CM | POA: Diagnosis not present

## 2022-11-18 DIAGNOSIS — S0181XD Laceration without foreign body of other part of head, subsequent encounter: Secondary | ICD-10-CM | POA: Diagnosis not present

## 2022-11-18 DIAGNOSIS — S0191XA Laceration without foreign body of unspecified part of head, initial encounter: Secondary | ICD-10-CM | POA: Insufficient documentation

## 2022-11-18 HISTORY — DX: Laceration without foreign body of unspecified part of head, initial encounter: S01.91XA

## 2022-11-18 NOTE — Progress Notes (Signed)
Tomasita Morrow, NP-C Phone: [phone]  [name] is a 81 y.o. female who presents today for suture removal.   Forehead Laceration- Patient had 4 sutures placed on the right side of her forehead in the ED on 12/21 after a syncopal episode, which they believe was due to hypotension. Patient denies any episodes since. Reports area is healing well. Denies fevers or chills.   HYPERTENSION Disease Monitoring Home BP Monitoring 120s/60s     Chest pain- No    Dyspnea- No Medications Compliance-  Patient stopped taking her Telmisartan 12/22 after syncopal episode and low BP at home.      Lightheadedness-  No  Edema- No BMET    Component Value Date/Time   NA 135 11/11/2022 1205   NA 139 10/16/2018 0922   NA 141 01/18/2013 1301   K 4.4 11/11/2022 1205   K 4.6 01/18/2013 1301   CL 99 11/11/2022 1205   CL 106 01/18/2013 1301   CO2 26 11/11/2022 1205   CO2 34 (H) 01/18/2013 1301   GLUCOSE 127 (H) 11/11/2022 1205   GLUCOSE 88 01/18/2013 1301   BUN 15 11/11/2022 1205   BUN 13 10/16/2018 0922   BUN 13 01/18/2013 1301   CREATININE 1.12 (H) 11/11/2022 1205   CREATININE 0.70 01/18/2013 1301   CALCIUM 9.2 11/11/2022 1205   CALCIUM 9.3 01/18/2013 1301   GFRNONAA 49 (L) 11/11/2022 1205   GFRNONAA >60 01/18/2013 1301   GFRAA 75 10/16/2018 0922   GFRAA >60 01/18/2013 1301     Social History   Tobacco Use  Smoking Status Former   Years: 1.00   Types: Cigarettes   Passive exposure: Past  Smokeless Tobacco Never  Tobacco Comments   Smoked x 1 year in her 20's    Current Outpatient Medications on File Prior to Visit  Medication Sig Dispense Refill   aspirin EC 81 MG tablet Take 81 mg by mouth daily.     cholecalciferol (VITAMIN D) 1000 UNITS tablet Take 2,000 Units by mouth daily.      Cranberry 450 MG CAPS Take by mouth.     diltiazem (CARDIZEM CD) 120 MG 24 hr capsule TAKE 1 CAPSULE BY MOUTH DAILY 90 capsule 3   diphenhydramine-acetaminophen (TYLENOL PM) 25-500 MG TABS tablet  Take 1 tablet by mouth at bedtime as needed.     estradiol (ESTRACE) 0.1 MG/GM vaginal cream Estrogen Cream Instruction Discard applicator Apply pea sized amount to tip of finger to urethra before bed. Wash hands well after application. Use Monday, Wednesday and Friday 42.5 g 12   glucosamine-chondroitin 500-400 MG tablet Take 1 tablet by mouth daily.      isosorbide mononitrate (IMDUR) 30 MG 24 hr tablet TAKE 1 TABLET BY MOUTH EVERY DAY 90 tablet 3   levothyroxine (SYNTHROID) 50 MCG tablet TAKE 1 TABLET BY MOUTH  DAILY 90 tablet 3   mometasone (ELOCON) 0.1 % lotion SMARTSIG:2-4 Drop(s) In Ear(s) Every Night PRN     Multiple Vitamins-Minerals (MULTIVITAMIN WITH MINERALS) tablet Take 1 tablet by mouth daily.     NIACINAMIDE PO Take by mouth.     Omega-3 Fatty Acids (FISH OIL) 1000 MG CPDR Take by mouth daily.     pantoprazole (PROTONIX) 20 MG tablet TAKE 1 TABLET BY MOUTH EVERY DAY 90 tablet 3   rosuvastatin (CRESTOR) 10 MG tablet TAKE 1 TABLET BY MOUTH  DAILY 90 tablet 3   telmisartan (MICARDIS) 40 MG tablet TAKE 1 TABLET BY MOUTH DAILY IN  THE MORNING FOR BLOOD  PRESSURE 90 tablet 3   traZODone (DESYREL) 50 MG tablet TAKE 1/2 TO 1 TABLET BY MOUTH AT BEDTIME AS NEEDED FOR SLEEP 90 tablet 2   No current facility-administered medications on file prior to visit.     ROS see history of present illness  Objective  Physical Exam Vitals:   11/18/22 0821  BP: 120/60  Pulse: 69  Temp: 98 F (36.7 C)  SpO2: 97%    BP Readings from Last 3 Encounters:  11/18/22 120/60  11/11/22 (!) 123/45  06/30/22 128/76   Wt Readings from Last 3 Encounters:  11/18/22 137 lb 12.8 oz (62.5 kg)  11/11/22 135 lb (61.2 kg)  06/30/22 132 lb 6.4 oz (60.1 kg)    Physical Exam Constitutional:      Appearance: Normal appearance.  HENT:     Head: Normocephalic.  Skin:    General: Skin is warm and dry.     Findings: Bruising (over nasal bridge and under bilateral eyes) and lesion (approx. 4 cm- well  healing. No drainage, inflammation or erythema present. Minimal scabbing noted.) present.  Neurological:     Mental Status: She is alert.    Assessment/Plan: Please see individual problem list.  Encounter for removal of sutures Assessment & Plan: 4 sutures removed easily. Patient tolerated well. No bleeding noted.    Laceration of other part of head without foreign body, subsequent encounter Assessment & Plan: Approx. 4cm well healed laceration on right side of forehead noted. 4 sutures present and removed. Encouraged patient to keep the area clean and dry.    Essential hypertension Assessment & Plan: Patient stopped taking her Telmisartan 11/12/22. She has been monitoring her blood pressures at home with results in the 120s/60s. She is worried that her blood pressure has been getting to low and would like to stay off the medication. I discussed with her the importance of monitoring her blood pressure and following up if her blood pressure starts to become elevated.     Return for as scheduled with Dr. Derrel Nip in February.   Tomasita Morrow, NP-C [place]

## 2022-11-18 NOTE — Assessment & Plan Note (Addendum)
Approx. 4cm well healed laceration on right side of forehead noted. 4 sutures present and removed. Encouraged patient to keep the area clean and dry.

## 2022-11-18 NOTE — Assessment & Plan Note (Signed)
Patient stopped taking her Telmisartan 11/12/22. She has been monitoring her blood pressures at home with results in the 120s/60s. She is worried that her blood pressure has been getting to low and would like to stay off the medication. I discussed with her the importance of monitoring her blood pressure and following up if her blood pressure starts to become elevated.

## 2022-11-18 NOTE — Assessment & Plan Note (Signed)
4 sutures removed easily. Patient tolerated well. No bleeding noted.

## 2022-11-26 ENCOUNTER — Other Ambulatory Visit: Payer: Self-pay

## 2022-12-02 ENCOUNTER — Encounter: Payer: Self-pay | Admitting: Internal Medicine

## 2022-12-02 ENCOUNTER — Ambulatory Visit (INDEPENDENT_AMBULATORY_CARE_PROVIDER_SITE_OTHER): Payer: PPO | Admitting: Internal Medicine

## 2022-12-02 VITALS — BP 118/68 | HR 61 | Temp 97.8°F | Ht 67.0 in | Wt 136.6 lb

## 2022-12-02 DIAGNOSIS — I493 Ventricular premature depolarization: Secondary | ICD-10-CM

## 2022-12-02 DIAGNOSIS — Z800 Family history of malignant neoplasm of digestive organs: Secondary | ICD-10-CM | POA: Diagnosis not present

## 2022-12-02 DIAGNOSIS — R200 Anesthesia of skin: Secondary | ICD-10-CM

## 2022-12-02 DIAGNOSIS — G629 Polyneuropathy, unspecified: Secondary | ICD-10-CM | POA: Diagnosis not present

## 2022-12-02 DIAGNOSIS — R0989 Other specified symptoms and signs involving the circulatory and respiratory systems: Secondary | ICD-10-CM

## 2022-12-02 DIAGNOSIS — H9312 Tinnitus, left ear: Secondary | ICD-10-CM | POA: Diagnosis not present

## 2022-12-02 DIAGNOSIS — I10 Essential (primary) hypertension: Secondary | ICD-10-CM | POA: Diagnosis not present

## 2022-12-02 DIAGNOSIS — R202 Paresthesia of skin: Secondary | ICD-10-CM

## 2022-12-02 DIAGNOSIS — I700 Atherosclerosis of aorta: Secondary | ICD-10-CM | POA: Diagnosis not present

## 2022-12-02 DIAGNOSIS — E039 Hypothyroidism, unspecified: Secondary | ICD-10-CM

## 2022-12-02 DIAGNOSIS — R7303 Prediabetes: Secondary | ICD-10-CM | POA: Diagnosis not present

## 2022-12-02 DIAGNOSIS — R55 Syncope and collapse: Secondary | ICD-10-CM

## 2022-12-02 DIAGNOSIS — E7849 Other hyperlipidemia: Secondary | ICD-10-CM

## 2022-12-02 LAB — COMPREHENSIVE METABOLIC PANEL
ALT: 20 U/L (ref 0–35)
AST: 23 U/L (ref 0–37)
Albumin: 4.4 g/dL (ref 3.5–5.2)
Alkaline Phosphatase: 50 U/L (ref 39–117)
BUN: 12 mg/dL (ref 6–23)
CO2: 32 mEq/L (ref 19–32)
Calcium: 9.7 mg/dL (ref 8.4–10.5)
Chloride: 101 mEq/L (ref 96–112)
Creatinine, Ser: 0.9 mg/dL (ref 0.40–1.20)
GFR: 59.95 mL/min — ABNORMAL LOW (ref >60.00)
Glucose, Bld: 90 mg/dL (ref 70–99)
Potassium: 4.3 mEq/L (ref 3.5–5.1)
Sodium: 141 mEq/L (ref 135–145)
Total Bilirubin: 0.4 mg/dL (ref 0.2–1.2)
Total Protein: 7.3 g/dL (ref 6.0–8.3)

## 2022-12-02 LAB — TSH: TSH: 2.48 u[IU]/mL (ref 0.35–5.50)

## 2022-12-02 LAB — HEMOGLOBIN A1C: Hgb A1c MFr Bld: 6.2 % (ref 4.6–6.5)

## 2022-12-02 LAB — MICROALBUMIN / CREATININE URINE RATIO
Creatinine,U: 63.6 mg/dL
Microalb Creat Ratio: 2 mg/g (ref 0.0–30.0)
Microalb, Ur: 1.3 mg/dL (ref 0.0–1.9)

## 2022-12-02 LAB — B12 AND FOLATE PANEL
Folate: >23.8 ng/mL (ref >5.9)
Vitamin B-12: 795 pg/mL (ref 211–911)

## 2022-12-02 NOTE — Patient Instructions (Signed)
DO NOT RESUME TELMISARTAN  RETURN WITH  [facility] FOR BP  CHECK WITH RN   NEW NEUROLOGY REFERRAL TO [facility]  I AGREE THAT A HEARING TEST SHOULD BE DONE

## 2022-12-02 NOTE — Progress Notes (Signed)
Subjective:  Patient ID: [name], female    DOB: 1941-06-14  Age: 82 y.o. MRN: [id number]  CC: The primary encounter diagnosis was Peripheral polyneuropathy. Diagnoses of Numbness and tingling of both feet, Acquired hypothyroidism, Familial hyperlipidemia, Elevated blood pressure reading in office with diagnosis of hypertension, Left carotid bruit, Tinnitus of left ear, Neuropathy, Syncope and collapse, Abdominal aortic atherosclerosis ([place]), Prediabetes, PVC's (premature ventricular contractions), and Family history of colon cancer were also pertinent to this visit.   HPI [name] presents for  Chief Complaint  Patient presents with   Medical Management of Chronic Issues    Follow up on blood pressure   Hypotension:  occurred during a syncopal episode on Dec 22 during exercise class,  episode was marked by a prodromal feeling of light headedness, decided to leave the gym and while walking out , lost consciousness and fell. .  Taken to ED for management of mild head trauma requiring stitches,  EMS noted very low blood pressure which persisted during ER evaluation.  CT spine and CT head were negative for fractures and acute changes.  Since then she has d/c'd telmisartan but  has continued diltiazem. Home readings taken daily with a wrist cuff have been <  120/70  .    Preceding medication changes:  Had recently resumed a trial of gabapentin prescribed by Dr Melrose Nakayama at her initial visit for evaluation of neuropathy (she has not had follow up or EMG nerve conduction studies  despite OV saying he ordered them  ) .  She does not feel that the gabapentin is helping .   Outpatient Medications Prior to Visit  Medication Sig Dispense Refill   aspirin EC 81 MG tablet Take 81 mg by mouth daily.     cholecalciferol (VITAMIN D) 1000 UNITS tablet Take 2,000 Units by mouth daily.      Cranberry 450 MG CAPS Take by mouth.     diltiazem (CARDIZEM CD) 120 MG 24 hr capsule TAKE 1 CAPSULE BY  MOUTH DAILY 90 capsule 3   diphenhydramine-acetaminophen (TYLENOL PM) 25-500 MG TABS tablet Take 1 tablet by mouth at bedtime as needed.     estradiol (ESTRACE) 0.1 MG/GM vaginal cream Estrogen Cream Instruction Discard applicator Apply pea sized amount to tip of finger to urethra before bed. Wash hands well after application. Use Monday, Wednesday and Friday 42.5 g 12   gabapentin (NEURONTIN) 100 MG capsule Take 200 mg by mouth 2 (two) times daily.     glucosamine-chondroitin 500-400 MG tablet Take 1 tablet by mouth daily.      isosorbide mononitrate (IMDUR) 30 MG 24 hr tablet TAKE 1 TABLET BY MOUTH EVERY DAY 90 tablet 3   levothyroxine (SYNTHROID) 50 MCG tablet TAKE 1 TABLET BY MOUTH  DAILY 90 tablet 3   mometasone (ELOCON) 0.1 % lotion SMARTSIG:2-4 Drop(s) In Ear(s) Every Night PRN     Multiple Vitamins-Minerals (MULTIVITAMIN WITH MINERALS) tablet Take 1 tablet by mouth daily.     NIACINAMIDE PO Take by mouth.     Omega-3 Fatty Acids (FISH OIL) 1000 MG CPDR Take by mouth daily.     pantoprazole (PROTONIX) 20 MG tablet TAKE 1 TABLET BY MOUTH EVERY DAY 90 tablet 3   rosuvastatin (CRESTOR) 10 MG tablet TAKE 1 TABLET BY MOUTH  DAILY 90 tablet 3   traZODone (DESYREL) 50 MG tablet TAKE 1/2 TO 1 TABLET BY MOUTH AT BEDTIME AS NEEDED FOR SLEEP 90 tablet 2   FLUAD QUADRIVALENT 0.5 ML injection  telmisartan (MICARDIS) 40 MG tablet TAKE 1 TABLET BY MOUTH DAILY IN  THE MORNING FOR BLOOD PRESSURE (Patient not taking: Reported on 12/02/2022) 90 tablet 3   No facility-administered medications prior to visit.    Review of Systems;  Patient denies headache, fevers, malaise, unintentional weight loss, skin rash, eye pain, sinus congestion and sinus pain, sore throat, dysphagia,  hemoptysis , cough, dyspnea, wheezing, chest pain, palpitations, orthopnea, edema, abdominal pain, nausea, melena, diarrhea, constipation, flank pain, dysuria, hematuria, urinary  Frequency, nocturia, n, seizures,  Focal weakness,   Tremor, insomnia, depression, anxiety, and suicidal ideation.      Objective:  BP 130/86   Pulse 61   Temp 97.8 F (36.6 C) (Oral)   Ht '5\' 7"'$  (1.702 m)   Wt 136 lb 9.6 oz (62 kg)   SpO2 98%   BMI 21.39 kg/m   BP Readings from Last 3 Encounters:  12/02/22 130/86  11/18/22 120/60  11/11/22 (!) 123/45    Wt Readings from Last 3 Encounters:  12/02/22 136 lb 9.6 oz (62 kg)  11/18/22 137 lb 12.8 oz (62.5 kg)  11/11/22 135 lb (61.2 kg)    Physical Exam Vitals reviewed.  Constitutional:      General: She is not in acute distress.    Appearance: Normal appearance. She is normal weight. She is not ill-appearing, toxic-appearing or diaphoretic.  HENT:     Head: Normocephalic.  Eyes:     General: No scleral icterus.       Right eye: No discharge.        Left eye: No discharge.     Conjunctiva/sclera: Conjunctivae normal.  Cardiovascular:     Rate and Rhythm: Normal rate and regular rhythm.     Heart sounds: Normal heart sounds.  Pulmonary:     Effort: Pulmonary effort is normal. No respiratory distress.     Breath sounds: Normal breath sounds.  Musculoskeletal:        General: Normal range of motion.  Skin:    General: Skin is warm and dry.  Neurological:     General: No focal deficit present.     Mental Status: She is alert and oriented to person, place, and time. Mental status is at baseline.  Psychiatric:        Mood and Affect: Mood normal.        Behavior: Behavior normal.        Thought Content: Thought content normal.        Judgment: Judgment normal.     Lab Results  Component Value Date   HGBA1C 6.2 12/02/2022   HGBA1C 6.3 06/30/2022   HGBA1C 6.0 12/30/2021    Lab Results  Component Value Date   CREATININE 0.90 12/02/2022   CREATININE 1.12 (H) 11/11/2022   CREATININE 0.92 06/30/2022    Lab Results  Component Value Date   WBC 8.1 11/11/2022   HGB 12.2 11/11/2022   HCT 37.0 11/11/2022   PLT 222 11/11/2022   GLUCOSE 90 12/02/2022   CHOL 180  12/02/2022   TRIG 126 12/02/2022   HDL 72 12/02/2022   LDLDIRECT 85.0 03/06/2018   LDLCALC 85 12/02/2022   ALT 20 12/02/2022   AST 23 12/02/2022   NA 141 12/02/2022   K 4.3 12/02/2022   CL 101 12/02/2022   CREATININE 0.90 12/02/2022   BUN 12 12/02/2022   CO2 32 12/02/2022   TSH 2.48 12/02/2022   HGBA1C 6.2 12/02/2022   MICROALBUR 1.3 12/02/2022    CT HEAD WO  CONTRAST (5MM)  Result Date: 11/11/2022 CLINICAL DATA:  Neck trauma EXAM: CT HEAD WITHOUT CONTRAST CT CERVICAL SPINE WITHOUT CONTRAST TECHNIQUE: Multidetector CT imaging of the head and cervical spine was performed following the standard protocol without intravenous contrast. Multiplanar CT image reconstructions of the cervical spine were also generated. RADIATION DOSE REDUCTION: This exam was performed according to the departmental dose-optimization program which includes automated exposure control, adjustment of the mA and/or kV according to patient size and/or use of iterative reconstruction technique. COMPARISON:  None Available. FINDINGS: CT HEAD FINDINGS Brain: No evidence of acute infarction, hemorrhage, hydrocephalus, extra-axial collection or mass lesion/mass effect. Vascular: No hyperdense vessel or unexpected calcification. Skull: Normal. Negative for fracture or focal lesion. Sinuses/Orbits: Bilateral lens replacement. Trace mucosal thickening right maxillary sinus. Other: Without CT CERVICAL SPINE FINDINGS Alignment: Grade 1 anterolisthesis C3 on C4 and trace retrolisthesis of C4 on C5 and C5 on C6. Skull base and vertebrae: No acute fracture. No primary bone lesion or focal pathologic process. Soft tissues and spinal canal: No prevertebral fluid or swelling. No visible canal hematoma. Disc levels:  No evidence of high-grade spinal canal stenosis. Upper chest: Negative. Other: None IMPRESSION: CT HEAD: No acute intracranial abnormality. CT CERVICAL SPINE: No acute fracture or traumatic subluxation. Electronically Signed   By:  Marin Roberts M.D.   On: 11/11/2022 13:03   CT CERVICAL SPINE WO CONTRAST  Result Date: 11/11/2022 CLINICAL DATA:  Neck trauma EXAM: CT HEAD WITHOUT CONTRAST CT CERVICAL SPINE WITHOUT CONTRAST TECHNIQUE: Multidetector CT imaging of the head and cervical spine was performed following the standard protocol without intravenous contrast. Multiplanar CT image reconstructions of the cervical spine were also generated. RADIATION DOSE REDUCTION: This exam was performed according to the departmental dose-optimization program which includes automated exposure control, adjustment of the mA and/or kV according to patient size and/or use of iterative reconstruction technique. COMPARISON:  None Available. FINDINGS: CT HEAD FINDINGS Brain: No evidence of acute infarction, hemorrhage, hydrocephalus, extra-axial collection or mass lesion/mass effect. Vascular: No hyperdense vessel or unexpected calcification. Skull: Normal. Negative for fracture or focal lesion. Sinuses/Orbits: Bilateral lens replacement. Trace mucosal thickening right maxillary sinus. Other: Without CT CERVICAL SPINE FINDINGS Alignment: Grade 1 anterolisthesis C3 on C4 and trace retrolisthesis of C4 on C5 and C5 on C6. Skull base and vertebrae: No acute fracture. No primary bone lesion or focal pathologic process. Soft tissues and spinal canal: No prevertebral fluid or swelling. No visible canal hematoma. Disc levels:  No evidence of high-grade spinal canal stenosis. Upper chest: Negative. Other: None IMPRESSION: CT HEAD: No acute intracranial abnormality. CT CERVICAL SPINE: No acute fracture or traumatic subluxation. Electronically Signed   By: Marin Roberts M.D.   On: 11/11/2022 13:03    Assessment & Plan:  .Peripheral polyneuropathy -     Ambulatory referral to Neurology  Numbness and tingling of both feet -     Hemoglobin A1c -     B12 and Folate Panel  Acquired hypothyroidism Assessment & Plan: TSH is therapeutic,  Continue current 50 MCG   levothyoxine dose,  Recheck in 6 months   Lab Results  Component Value Date   TSH 2.48 12/02/2022     Orders: -     TSH  Familial hyperlipidemia -     Comprehensive metabolic panel -     Lipid Panel w/reflex Direct LDL  Elevated blood pressure reading in office with diagnosis of hypertension Assessment & Plan: Home readings using a wrist cuff are well below office reading  and all are under 120/70.  Given her recent hypotensive episode resulting in syncope,  I have NOT resumed telmisartan but have asked her to return with home wrist cuff for an RN visit   Orders: -     Microalbumin / creatinine urine ratio  Left carotid bruit Assessment & Plan: Reviewed carotid doppler u/s which was done in 2015 and repeated Feb 2023:  no significant stenosis of left carotid; < 50% stenosis of Right CCA    Tinnitus of left ear Assessment & Plan: No improvement. She as agreed to ENT evaluation.   Neuropathy Assessment & Plan: With failed monofilament test on the lateral sides of both feet per podiatry.  She was referred to Dr Melrose Nakayama by podiatry and was evaluated without EMG/ studies and prescribed gabapentin.  She has not followed up with him ,  and I have advised her to stop the gabepentin given her recent syncopal episode and lack of any ameliorative effect o her symptoms.  Screening today for deficiencies and progression of her known prediabetes has been done with no obvious cause.  Will get second more comprehensive  opinion from [facility]    Syncope and collapse Assessment & Plan: Her recent episode was accompanied by prolonged  hypotensive episode  and no arrhythmias noted during ER evaluation.  BP has normalized without use of telmisartan   Abdominal aortic atherosclerosis ([facility]) Assessment & Plan: Reviewed findings of prior CT scan today..  Patient is tolerating high potency statin therapy    Prediabetes Assessment & Plan: Her  a1c has risen slightly in spite of  adherence to low glycemic index diet.  Encouraged to continue healthy lifestyle .  Will check annually   Lab Results  Component Value Date   HGBA1C 6.2 12/02/2022      PVC's (premature ventricular contractions) Assessment & Plan: Managed with  the lowest dose of once daily diltiazem taken every other day to avoid recurrent  symptomatic bradycardia   Family history of colon cancer Assessment & Plan: Last colonoscopy Jan 2021 : a 3 mm tubular adenoma was removed.  No follow up screening was advised given her age      I provided 30 minutes of face-to-face time during this encounter reviewing patient's last visit with me, patient's recent ER visit, her most recent   neurology,  cardiology and nephrology visits,  recent surgical and non surgical procedures, previous  labs and imaging studies, counseling on currently addressed issues,  and post visit ordering to diagnostics and therapeutics .   Follow-up: Return in about 3 months (around 03/03/2023).   [name], MD

## 2022-12-02 NOTE — Assessment & Plan Note (Signed)
Home readings using a wrist cuff are well below office reading and all are under 120/70.  Given her recent hypotensive episode resulting in syncope,  I have NOT resumed telmisartan but have asked her to return with home wrist cuff for an RN visit

## 2022-12-03 LAB — LIPID PANEL W/REFLEX DIRECT LDL
Cholesterol: 180 mg/dL (ref <200)
HDL: 72 mg/dL (ref >=50)
LDL Cholesterol (Calc): 85 mg/dL (calc)
Non-HDL Cholesterol (Calc): 108 mg/dL (calc) (ref <130)
Total CHOL/HDL Ratio: 2.5 (calc) (ref <5.0)
Triglycerides: 126 mg/dL (ref <150)

## 2022-12-04 ENCOUNTER — Encounter: Payer: Self-pay | Admitting: Internal Medicine

## 2022-12-04 DIAGNOSIS — R55 Syncope and collapse: Secondary | ICD-10-CM | POA: Insufficient documentation

## 2022-12-04 NOTE — Assessment & Plan Note (Signed)
TSH is therapeutic,  Continue current 50 MCG  levothyoxine dose,  Recheck in 6 months   Lab Results  Component Value Date   TSH 2.48 12/02/2022

## 2022-12-04 NOTE — Assessment & Plan Note (Signed)
With failed monofilament test on the lateral sides of both feet per podiatry.  She was referred to Dr Melrose Nakayama by podiatry and was evaluated without EMG/[place] studies and prescribed gabapentin.  She has not followed up with him ,  and I have advised her to stop the gabepentin given her recent syncopal episode and lack of any ameliorative effect o her symptoms.  Screening today for deficiencies and progression of her known prediabetes has been done with no obvious cause.  Will get second more comprehensive  opinion from [facility]

## 2022-12-04 NOTE — Assessment & Plan Note (Signed)
Last colonoscopy Jan 2021 : a 3 mm tubular adenoma was removed.  No follow up screening was advised given her age

## 2022-12-04 NOTE — Assessment & Plan Note (Signed)
Managed with  the lowest dose of once daily diltiazem taken every other day to avoid recurrent  symptomatic bradycardia

## 2022-12-04 NOTE — Assessment & Plan Note (Signed)
Reviewed carotid doppler u/s which was done in 2015 and repeated Feb 2023:  no significant stenosis of left carotid; < 50% stenosis of Right CCA

## 2022-12-04 NOTE — Assessment & Plan Note (Signed)
No improvement. She as agreed to ENT evaluation.

## 2022-12-04 NOTE — Assessment & Plan Note (Signed)
Her recent episode was accompanied by prolonged  hypotensive episode  and no arrhythmias noted during ER evaluation.  BP has normalized without use of telmisartan

## 2022-12-04 NOTE — Assessment & Plan Note (Signed)
Reviewed findings of prior CT scan today..  Patient is tolerating high potency statin therapy  

## 2022-12-04 NOTE — Assessment & Plan Note (Signed)
Her  a1c has risen slightly in spite of adherence to low glycemic index diet.  Encouraged to continue healthy lifestyle .  Will check annually   Lab Results  Component Value Date   HGBA1C 6.2 12/02/2022

## 2022-12-06 ENCOUNTER — Other Ambulatory Visit: Payer: Self-pay | Admitting: Internal Medicine

## 2022-12-07 ENCOUNTER — Ambulatory Visit (INDEPENDENT_AMBULATORY_CARE_PROVIDER_SITE_OTHER): Payer: PPO

## 2022-12-07 DIAGNOSIS — R030 Elevated blood-pressure reading, without diagnosis of hypertension: Secondary | ICD-10-CM | POA: Diagnosis not present

## 2022-12-07 NOTE — Progress Notes (Signed)
Patient presented for BP check and her BP was taken in the left arm and it was 136/80 and O2 was 98 hr,74 with the manual cuff. Pt brought her wrist cuff and when I took it with her cuff it was 138/76 in her left arm and O2 was 96 and Hr was 74

## 2022-12-08 ENCOUNTER — Encounter: Payer: Medicare Other | Admitting: Dermatology

## 2022-12-14 ENCOUNTER — Encounter: Payer: Self-pay | Admitting: Neurology

## 2022-12-27 NOTE — Progress Notes (Unsigned)
Cardiology Office Note  Date:  12/28/2022   ID:  [name], DOB 01-06-1941, MRN [id number]  PCP:  Karen Mc, MD   Chief Complaint  Patient presents with   12 month follow up     "Doing well." Medication reviewed by the patient verbally.     HPI:  [name] is a 82 y.o. female with history of  symptomatic PVCs,  hypothyroidism, and  hyperlipidemia  who presents for follow-up of PVCs.  Cardiac CTA 2019, no coronary artery disease, calcium score 0 2.0  cm anterior mediastinal mass or lymph node on CT 2022 seen in clinic 2019 [name], Dr. 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 Son and daughter in town  Started going back to exercise at the [facility]  Episode of syncope following exercise class at the [facility]  Had completed exercise class at 10:30 AM  During the class had near syncope, got up to leave, and outside the door of her class had syncope, hit her head Came back quick, to ER 11/11/22 PMD stopped telmisartan BP at home recently 120-130/70  On further discussion she was taking 3 blood pressure medications in the morning diltiazem ER, telmisartan and Imdur Sometimes not on a full breakfast  Lab work reviewed A1c 6.2  EKG personally reviewed by myself on todays visit NSr rate 64 no ST or T wave changes   A Holter was performed in 01/2016 and showed 5300 PVCs in 48 hours representing a 3% burden, occasional PACs, and short runs of SVT.    started on metoprolol tartrate 25 mg twice daily.  However, she did not tolerate this medication secondary to fatigue.   PMH:   has a past medical history of Arthritis, Basal cell carcinoma (10/23/2020), Basal cell carcinoma (05/13/2021), Basal cell carcinoma (12/03/2021), Basal cell carcinoma (BCC) (05/13/2021), Candida esophagitis ([place]) (06/30/2021), E. coli UTI  (09/10/2015), History of dysplastic nevus (05/13/2021), Hyperlipidemia, Hypothyroidism, Laceration of head without foreign body (11/18/2022), and PVC's (premature ventricular contractions).  PSH:    Past Surgical History:  Procedure Laterality Date   ABDOMINAL HYSTERECTOMY     In 91's   BROW LIFT Bilateral 07/12/2017   Procedure: BLEPHAROPLASTY upper eyelid with excess skin;  Surgeon: Karle Starch, MD;  Location: [place];  Service: Ophthalmology;  Laterality: Bilateral;  MAC   COLONOSCOPY WITH PROPOFOL N/A 12/29/2015   Procedure: COLONOSCOPY WITH PROPOFOL;  Surgeon: Hulen Luster, MD;  Location: [facility] ENDOSCOPY;  Service: Gastroenterology;  Laterality: N/A;   COLONOSCOPY WITH PROPOFOL N/A 12/03/2019   Procedure: COLONOSCOPY WITH BIOPSY;  Surgeon: [name], MD;  Location: [place];  Service: Endoscopy;  Laterality: N/A;   ESOPHAGOGASTRODUODENOSCOPY (EGD) WITH PROPOFOL N/A 06/25/2021   Procedure: ESOPHAGOGASTRODUODENOSCOPY (EGD) WITH PROPOFOL;  Surgeon: [name], MD;  Location: [place];  Service: Endoscopy;  Laterality: N/A;   POLYPECTOMY N/A 12/03/2019   Procedure: POLYPECTOMY;  Surgeon: [name], MD;  Location: [place];  Service: Endoscopy;  Laterality: N/A;   PTOSIS REPAIR Bilateral 07/12/2017   Procedure: ptosis repair resect ex;  Surgeon: Karle Starch, MD;  Location: [place];  Service: Ophthalmology;  Laterality: Bilateral;  Requests early   VAGINAL PROLAPSE REPAIR  2014   Dr. Enzo Bi    Current Outpatient Medications  Medication Sig Dispense Refill   aspirin  EC 81 MG tablet Take 81 mg by mouth daily.     cholecalciferol (VITAMIN D) 1000 UNITS tablet Take 2,000 Units by mouth daily.      Cranberry 450 MG CAPS Take by mouth.     diltiazem (CARDIZEM CD) 120 MG 24 hr capsule TAKE 1 CAPSULE BY MOUTH DAILY 90 capsule 3   diphenhydramine-acetaminophen (TYLENOL PM) 25-500 MG TABS tablet Take 1 tablet by mouth at bedtime as  needed.     estradiol (ESTRACE) 0.1 MG/GM vaginal cream Estrogen Cream Instruction Discard applicator Apply pea sized amount to tip of finger to urethra before bed. Wash hands well after application. Use Monday, Wednesday and Friday 42.5 g 12   gabapentin (NEURONTIN) 100 MG capsule Take 200 mg by mouth 2 (two) times daily.     glucosamine-chondroitin 500-400 MG tablet Take 1 tablet by mouth daily.      isosorbide mononitrate (IMDUR) 30 MG 24 hr tablet TAKE 1 TABLET BY MOUTH EVERY DAY 90 tablet 3   levothyroxine (SYNTHROID) 50 MCG tablet TAKE 1 TABLET BY MOUTH  DAILY 90 tablet 3   mometasone (ELOCON) 0.1 % lotion SMARTSIG:2-4 Drop(s) In Ear(s) Every Night PRN     Multiple Vitamins-Minerals (MULTIVITAMIN WITH MINERALS) tablet Take 1 tablet by mouth daily.     NIACINAMIDE PO Take by mouth.     Omega-3 Fatty Acids (FISH OIL) 1000 MG CPDR Take by mouth daily.     pantoprazole (PROTONIX) 20 MG tablet TAKE 1 TABLET BY MOUTH EVERY DAY 90 tablet 3   rosuvastatin (CRESTOR) 10 MG tablet TAKE 1 TABLET BY MOUTH  DAILY 90 tablet 3   telmisartan (MICARDIS) 40 MG tablet TAKE 1 TABLET BY MOUTH DAILY IN  THE MORNING FOR BLOOD PRESSURE 100 tablet 1   traZODone (DESYREL) 50 MG tablet TAKE 1/2 TO 1 TABLET BY MOUTH AT BEDTIME AS NEEDED FOR SLEEP 90 tablet 2   No current facility-administered medications for this visit.   Allergies:   Prednisone   Social History:  The patient  reports that she has quit smoking. Her smoking use included cigarettes. She has been exposed to tobacco smoke. She has never used smokeless tobacco. She reports that she does not drink alcohol and does not use drugs.   Family History:   family history includes Breast cancer in her mother, sister, and sister; Cancer in her brother, brother, brother, sister, and sister; Diabetes in her brother, brother, brother, father, and mother; Heart attack in her brother and mother; Pancreatic cancer in her sister; Stroke in her father.   Review of  Systems: Review of Systems  Constitutional: Negative.   HENT: Negative.    Respiratory: Negative.    Cardiovascular: Negative.   Gastrointestinal: Negative.   Musculoskeletal: Negative.   Neurological: Negative.   Psychiatric/Behavioral: Negative.    All other systems reviewed and are negative.   PHYSICAL EXAM: VS:  BP (!) 140/80 (BP Location: Left Arm, Patient Position: Sitting, Cuff Size: Normal)   Pulse 64   Ht '5\' 7"'$  (1.702 m)   Wt 139 lb 2 oz (63.1 kg)   SpO2 98%   BMI 21.79 kg/m  , BMI Body mass index is 21.79 kg/m. Constitutional:  oriented to person, place, and time. No distress.  HENT:  Head: Grossly normal Eyes:  no discharge. No scleral icterus.  Neck: No JVD, no carotid bruits  Cardiovascular: Regular rate and rhythm, no murmurs appreciated Pulmonary/Chest: Clear to auscultation bilaterally, no wheezes or rails Abdominal: Soft.  no distension.  no  tenderness.  Musculoskeletal: Normal range of motion Neurological:  normal muscle tone. Coordination normal. No atrophy Skin: Skin warm and dry Psychiatric: normal affect, pleasant   Recent Labs: 11/11/2022: Hemoglobin 12.2; Platelets 222 12/02/2022: ALT 20; BUN 12; Creatinine, Ser 0.90; Potassium 4.3; Sodium 141; TSH 2.48    Lipid Panel Lab Results  Component Value Date   CHOL 180 12/02/2022   HDL 72 12/02/2022   LDLCALC 85 12/02/2022   TRIG 126 12/02/2022      Wt Readings from Last 3 Encounters:  12/28/22 139 lb 2 oz (63.1 kg)  12/02/22 136 lb 9.6 oz (62 kg)  11/18/22 137 lb 12.8 oz (62.5 kg)      ASSESSMENT AND PLAN:  Problem List Items Addressed This Visit       Cardiology Problems   Abdominal aortic atherosclerosis ([place])     Other   Prediabetes   Other Visit Diagnoses     PVC (premature ventricular contraction)    -  Primary   Essential hypertension       Mixed hyperlipidemia         Mediastinal mass/lymph node 2 cm in size, no change over serial CT scans Felt to be benign  finding  Syncope Likely from taking 3 blood pressure medications in the morning, class for exercise 10:30 AM developed syncope after the class Reports blood pressure still stable off telmisartan Recommend she consider either moving isosorbide or diltiazem to the evening For any additional episodes of near syncope or syncope recommended she call our office, Zio monitor could be ordered  Chest pain Atypical, prior cardiac CTA with no significant disease  Essential hypertension Management as above, continue isosorbide with diltiazem  Palpitations/PVCs asymptomatic,  Continue diltiazem, previously did not tolerate beta-blocker  Hyperlipidemia Tolerating Crestor 10 daily Prior cardiac CTA no coronary calcification  Adjustment disorder Recent loss of husband September 2023   Total encounter time more than 30 minutes  Greater than 50% was spent in counseling and coordination of care with the patient    Signed, Esmond Plants, M.D., Ph.D. [place], [place]

## 2022-12-28 ENCOUNTER — Ambulatory Visit: Payer: HMO | Attending: Cardiovascular Disease | Admitting: Cardiovascular Disease

## 2022-12-28 ENCOUNTER — Encounter: Payer: Self-pay | Admitting: Cardiovascular Disease

## 2022-12-28 VITALS — BP 140/80 | HR 64 | Ht 67.0 in | Wt 139.1 lb

## 2022-12-28 DIAGNOSIS — I700 Atherosclerosis of aorta: Secondary | ICD-10-CM | POA: Diagnosis not present

## 2022-12-28 DIAGNOSIS — I10 Essential (primary) hypertension: Secondary | ICD-10-CM | POA: Diagnosis not present

## 2022-12-28 DIAGNOSIS — E782 Mixed hyperlipidemia: Secondary | ICD-10-CM

## 2022-12-28 DIAGNOSIS — R7303 Prediabetes: Secondary | ICD-10-CM | POA: Diagnosis not present

## 2022-12-28 DIAGNOSIS — I493 Ventricular premature depolarization: Secondary | ICD-10-CM

## 2022-12-28 MED ORDER — DILTIAZEM HCL ER COATED BEADS 120 MG PO CP24: 120.0000 mg | ORAL_CAPSULE | Freq: Every day | ORAL | 3 refills | Status: DC

## 2022-12-28 NOTE — Patient Instructions (Signed)
Medication Instructions:  No changes  If you need a refill on your cardiac medications before your next appointment, please call your pharmacy.   Lab work: No new labs needed  Testing/Procedures: No new testing needed  Follow-Up: At [facility], you and your health needs are our priority.  As part of our continuing mission to provide you with exceptional heart care, we have created designated Provider Care Teams.  These Care Teams include your primary Cardiologist (physician) and Advanced Practice Providers (APPs -  Physician Assistants and Nurse Practitioners) who all work together to provide you with the care you need, when you need it.  You will need a follow up appointment in 12 months  Providers on your designated Care Team:   Christopher Berge, NP Ryan Dunn, PA-C Cadence Furth, PA-C  COVID-19 Vaccine Information can be found at: [email or web address] For questions related to vaccine distribution or appointments, please email [email or web address] or call [phone].   

## 2022-12-31 ENCOUNTER — Other Ambulatory Visit: Payer: Medicare Other

## 2023-01-03 ENCOUNTER — Ambulatory Visit: Payer: Medicare Other | Admitting: Internal Medicine

## 2023-01-05 DIAGNOSIS — H6063 Unspecified chronic otitis externa, bilateral: Secondary | ICD-10-CM | POA: Diagnosis not present

## 2023-01-05 DIAGNOSIS — H903 Sensorineural hearing loss, bilateral: Secondary | ICD-10-CM | POA: Diagnosis not present

## 2023-01-05 DIAGNOSIS — H9312 Tinnitus, left ear: Secondary | ICD-10-CM | POA: Diagnosis not present

## 2023-01-05 DIAGNOSIS — R42 Dizziness and giddiness: Secondary | ICD-10-CM | POA: Diagnosis not present

## 2023-01-06 ENCOUNTER — Other Ambulatory Visit: Payer: Self-pay | Admitting: Otolaryngology

## 2023-01-06 DIAGNOSIS — H9041 Sensorineural hearing loss, unilateral, right ear, with unrestricted hearing on the contralateral side: Secondary | ICD-10-CM

## 2023-01-06 DIAGNOSIS — H9312 Tinnitus, left ear: Secondary | ICD-10-CM

## 2023-01-12 DIAGNOSIS — M170 Bilateral primary osteoarthritis of knee: Secondary | ICD-10-CM | POA: Diagnosis not present

## 2023-01-21 ENCOUNTER — Other Ambulatory Visit: Payer: Self-pay | Admitting: Internal Medicine

## 2023-01-24 ENCOUNTER — Ambulatory Visit (INDEPENDENT_AMBULATORY_CARE_PROVIDER_SITE_OTHER): Payer: HMO | Admitting: Neurology

## 2023-01-24 ENCOUNTER — Encounter: Payer: Self-pay | Admitting: Neurology

## 2023-01-24 VITALS — BP 152/74 | HR 62 | Ht 67.0 in | Wt 137.0 lb

## 2023-01-24 DIAGNOSIS — R202 Paresthesia of skin: Secondary | ICD-10-CM

## 2023-01-24 DIAGNOSIS — G629 Polyneuropathy, unspecified: Secondary | ICD-10-CM | POA: Diagnosis not present

## 2023-01-24 MED ORDER — TIZANIDINE HCL 2 MG PO TABS: 2.0000 mg | ORAL_TABLET | Freq: Every evening | ORAL | 3 refills | Status: DC | PRN

## 2023-01-24 NOTE — Progress Notes (Signed)
[facility] Clinic Note - Initial Visit   Date: 01/24/2023   [name] MRN: [id number] DOB: 1941/02/08   Dear Dr. Derrel Nip:  Thank you for your kind referral of [name] for consultation of neuropathy. Although her history is well known to you, please allow [place] to reiterate it for the purpose of our medical record. The patient was accompanied to the clinic by daughter, [name], who also provides collateral information.     [name] is a 82 y.o. left-handed female with hypertension, hypothyroidism, hyperlipidemia, prediabetes, and PVCs presenting for evaluation of neuropathy.   IMPRESSION/PLAN: Idiopathic peripheral neuropathy affecting the feet.  Her neurological examination shows a distal predominant large fiber peripheral neuropathy. I had extensive discussion with the patient regarding the pathogenesis, etiology, management, and natural course of neuropathy. Neuropathy tends to be slowly progressive, especially if a treatable etiology is not identified. Prior lab testing does not disclose treatable causes of neuropathy. I discussed that in the vast majority of cases, despite checking for reversible causes, we are unable to find the underlying etiology and management is symptomatic.    - Previously tried gabapentin (low dose).  She denies significant pain, so I would not recommend taking any medication.  Pt informed that numbness does not respond to medication  - Patient educated on daily foot inspection, fall prevention, and safety precautions around the home.  - NCS/EMG of the left leg  2.   Left hand paresthesias, ?carpal tunnel syndrome  - NCS/EMG of the left arm  3.   Muscle cramps   - Start tizanidine '2mg'$  at bedtime  - Recommend nightly posterior leg stretches   Return to clinic in 4 months  ------------------------------------------------------------- History of present illness: Starting around 2022, she began having numbness  and tingling in the feet.  She feels as if there is sandpaper at the bottom of her feet.  Feet feel very cold. She saw Dr. Melrose Nakayama in 2023 who offered gabapentin, however, she did not appreciate any benefit so stopped it.  She did not have NCS/EMG.   Se also complains of painful muscle cramps in the left leg, which is worse in the morning.  She takes pickle juice when severe, which can help.  No weakness in the legs.  She walks unassisted.   She had a fall in December at [facility] with a syncopal event due to hypotension.   Occasionally, she also has numbness/tingling and weakness in the left hand.  She may drop items.    Out-side paper records, electronic medical record, and images have been reviewed where available and summarized as:  Lab Results  Component Value Date   HGBA1C 6.2 12/02/2022   Lab Results  Component Value Date   [id number] 12/02/2022   Lab Results  Component Value Date   TSH 2.48 12/02/2022   No results found for: "ESRSEDRATE", "POCTSEDRATE"  Past Medical History:  Diagnosis Date   Arthritis    knees   Basal cell carcinoma 10/23/2020   right lateral ankle EDC done 12/18/20   Basal cell carcinoma 05/13/2021   right pretibia, [facility] 07/22/21   Basal cell carcinoma 12/03/2021   left chest, EDC 01/06/2022   Basal cell carcinoma ([place]) 05/13/2021   left forehead, Moh's 07/29/2021   Candida esophagitis ([place]) 06/30/2021   E. coli UTI 09/10/2015   Resistant to cipro .  Keflex sent x 5 days    History of dysplastic nevus 05/13/2021   right dorsal hand, mild   Hyperlipidemia  Hypothyroidism    Laceration of head without foreign body 11/18/2022   PVC's (premature ventricular contractions)    a.  48-hour Holter in 3/17 demonstrated NSR, frequent PVCs totaling 5300 beats representing 3% burden, occasional PACs, short runs of SVT    Past Surgical History:  Procedure Laterality Date   ABDOMINAL HYSTERECTOMY     In 30's   BROW LIFT Bilateral 07/12/2017   Procedure:  BLEPHAROPLASTY upper eyelid with excess skin;  Surgeon: Karle Starch, MD;  Location: [place];  Service: Ophthalmology;  Laterality: Bilateral;  MAC   COLONOSCOPY WITH PROPOFOL N/A 12/29/2015   Procedure: COLONOSCOPY WITH PROPOFOL;  Surgeon: Hulen Luster, MD;  Location: [facility] ENDOSCOPY;  Service: Gastroenterology;  Laterality: N/A;   COLONOSCOPY WITH PROPOFOL N/A 12/03/2019   Procedure: COLONOSCOPY WITH BIOPSY;  Surgeon: [name], MD;  Location: [place];  Service: Endoscopy;  Laterality: N/A;   ESOPHAGOGASTRODUODENOSCOPY (EGD) WITH PROPOFOL N/A 06/25/2021   Procedure: ESOPHAGOGASTRODUODENOSCOPY (EGD) WITH PROPOFOL;  Surgeon: [name], MD;  Location: [place];  Service: Endoscopy;  Laterality: N/A;   POLYPECTOMY N/A 12/03/2019   Procedure: POLYPECTOMY;  Surgeon: [name], MD;  Location: [place];  Service: Endoscopy;  Laterality: N/A;   PTOSIS REPAIR Bilateral 07/12/2017   Procedure: ptosis repair resect ex;  Surgeon: Karle Starch, MD;  Location: [place];  Service: Ophthalmology;  Laterality: Bilateral;  Requests early   VAGINAL PROLAPSE REPAIR  2014   Dr. Enzo Bi     Medications:  Outpatient Encounter Medications as of 01/24/2023  Medication Sig   aspirin EC 81 MG tablet Take 81 mg by mouth daily.   cholecalciferol (VITAMIN D) 1000 UNITS tablet Take 2,000 Units by mouth daily.    Cranberry 450 MG CAPS Take by mouth.   diltiazem (CARDIZEM CD) 120 MG 24 hr capsule Take 1 capsule (120 mg total) by mouth daily.   diphenhydramine-acetaminophen (TYLENOL PM) 25-500 MG TABS tablet Take 1 tablet by mouth at bedtime as needed.   glucosamine-chondroitin 500-400 MG tablet Take 1 tablet by mouth daily.    isosorbide mononitrate (IMDUR) 30 MG 24 hr tablet TAKE 1 TABLET BY MOUTH EVERY DAY   levothyroxine (SYNTHROID) 50 MCG tablet TAKE 1 TABLET BY MOUTH DAILY   Multiple Vitamins-Minerals (MULTIVITAMIN WITH MINERALS) tablet Take 1 tablet by  mouth daily.   NIACINAMIDE PO Take 500 mg by mouth.   Omega-3 Fatty Acids (FISH OIL) 1000 MG CPDR Take by mouth daily.   pantoprazole (PROTONIX) 20 MG tablet TAKE 1 TABLET BY MOUTH EVERY DAY   rosuvastatin (CRESTOR) 10 MG tablet TAKE 1 TABLET BY MOUTH DAILY   traZODone (DESYREL) 50 MG tablet TAKE 1/2 TO 1 TABLET BY MOUTH AT BEDTIME AS NEEDED FOR SLEEP   [DISCONTINUED] estradiol (ESTRACE) 0.1 MG/GM vaginal cream Estrogen Cream Instruction Discard applicator Apply pea sized amount to tip of finger to urethra before bed. Wash hands well after application. Use Monday, Wednesday and Friday (Patient not taking: Reported on 01/24/2023)   [DISCONTINUED] gabapentin (NEURONTIN) 100 MG capsule Take 200 mg by mouth 2 (two) times daily. (Patient not taking: Reported on 01/24/2023)   [DISCONTINUED] mometasone (ELOCON) 0.1 % lotion SMARTSIG:2-4 Drop(s) In Ear(s) Every Night PRN (Patient not taking: Reported on 01/24/2023)   No facility-administered encounter medications on file as of 01/24/2023.    Allergies:  Allergies  Allergen Reactions   Prednisone     "crazy"    Family History: Family History  Problem Relation Age of Onset  Breast cancer Mother        68's   Heart attack Mother    Diabetes Mother    Stroke Father    Diabetes Father    Pancreatic cancer Sister    Cancer Sister        pancreatic   Cancer Sister        anal   Breast cancer Sister    Breast cancer Sister    Cancer Brother        liver   Diabetes Brother    Heart attack Brother    Diabetes Brother    Cancer Brother        stomach   Cancer Brother    Diabetes Brother    Colon cancer Neg Hx     Social History: Social History   Tobacco Use   Smoking status: Former    Years: 1.00    Types: Cigarettes    Passive exposure: Past   Smokeless tobacco: Never   Tobacco comments:    Smoked x 1 year in her 20's  Vaping Use   Vaping Use: Never used  Substance Use Topics   Alcohol use: No   Drug use: No   Social History    Social History Narrative   Daily Caffeine Use:  One cup coffee in am Regular Exercise -  5 days a week x 1 hour - gym, aerobic and wt training      Left Handed    Lives in a two story home but stays on first floor.     Vital Signs:  BP (!) 152/74   Pulse 62   Ht '5\' 7"'$  (1.702 m)   Wt 137 lb (62.1 kg)   SpO2 99%   BMI 21.46 kg/m    Neurological Exam: MENTAL STATUS including orientation to time, place, person, recent and remote memory, attention span and concentration, language, and fund of knowledge is normal.  Speech is not dysarthric.  CRANIAL NERVES: II:  No visual field defects.     III-IV-VI: Pupils equal round and reactive to light.  Normal conjugate, extra-ocular eye movements in all directions of gaze.  No nystagmus.  No ptosis.   V:  Normal facial sensation.    VII:  Normal facial symmetry and movements.   VIII:  Normal hearing and vestibular function.   IX-X:  Normal palatal movement.   XI:  Normal shoulder shrug and head rotation.   XII:  Normal tongue strength and range of motion, no deviation or fasciculation.  MOTOR:  No atrophy, fasciculations or abnormal movements.  No pronator drift.   Upper Extremity:  Right  Left  Deltoid  5/5   5/5   Biceps  5/5   5/5   Triceps  5/5   5/5   Wrist extensors  5/5   5/5   Wrist flexors  5/5   5/5   Finger extensors  5/5   5/5   Finger flexors  5/5   5/5   Dorsal interossei  5/5   5/5   Abductor pollicis  5/5   5/5   Tone (Ashworth scale)  0  0   Lower Extremity:  Right  Left  Hip flexors  5/5   5/5   Knee flexors  5/5   5/5   Knee extensors  5/5   5/5   Dorsiflexors  5/5   5/5   Plantarflexors  5/5   5/5   Toe extensors  5/5   5/5   Toe flexors  5/5   5/5   Tone (Ashworth scale)  0  0   MSRs:                                           Right        Left brachioradialis 2+  2+  biceps 2+  2+  triceps 2+  2+  patellar 1+  1+  ankle jerk 0  0  Hoffman no  no  plantar response down  down   SENSORY:   Vibration absent at the great toe bilaterally.  Temperature and pin prick reduced over the dorsum of the feet.  Rhomberg's sign is mildly positive.    COORDINATION/GAIT: Normal finger-to- nose-finger.  Intact rapid alternating movements bilaterally.  Able to rise from a chair without using arms.  Gait narrow based and stable. Stressed gait intact.  Mild unsteadiness with tandem gait.    Thank you for allowing me to participate in patient's care.  If I can answer any additional questions, I would be pleased to do so.    Sincerely,    Zamyiah Tino K. Posey Pronto, DO

## 2023-01-24 NOTE — Patient Instructions (Addendum)
Nerve testing of the left arm and leg  Start tizandine '2mg'$  at bedtime as needed  Return to clinic in 4 months  [place] (EMG/NCS) INSTRUCTIONS  How to Prepare The neurologist conducting the EMG will need to know if you have certain medical conditions. Tell the neurologist and other EMG lab personnel if you: Have a pacemaker or any other electrical medical device Take blood-thinning medications Have hemophilia, a blood-clotting disorder that causes prolonged bleeding Bathing Take a shower or bath shortly before your exam in order to remove oils from your skin. Don't apply lotions or creams before the exam.  What to Expect You'll likely be asked to change into a hospital gown for the procedure and lie down on an examination table. The following explanations can help you understand what will happen during the exam.  Electrodes. The neurologist or a technician places surface electrodes at various locations on your skin depending on where you're experiencing symptoms. Or the neurologist may insert needle electrodes at different sites depending on your symptoms.  Sensations. The electrodes will at times transmit a tiny electrical current that you may feel as a twinge or spasm. The needle electrode may cause discomfort or pain that usually ends shortly after the needle is removed. If you are concerned about discomfort or pain, you may want to talk to the neurologist about taking a short break during the exam.  Instructions. During the needle EMG, the neurologist will assess whether there is any spontaneous electrical activity when the muscle is at rest - activity that isn't present in healthy muscle tissue - and the degree of activity when you slightly contract the muscle.  He or she will give you instructions on resting and contracting a muscle at appropriate times. Depending on what muscles and nerves the neurologist is examining, he or she may ask you to change  positions during the exam.  After your EMG You may experience some temporary, minor bruising where the needle electrode was inserted into your muscle. This bruising should fade within several days. If it persists, contact your primary care doctor.

## 2023-01-25 ENCOUNTER — Ambulatory Visit
Admission: RE | Admit: 2023-01-25 | Discharge: 2023-01-25 | Disposition: A | Discharge status: Home / Self Care | Payer: HMO | Source: Ambulatory Visit | Attending: Otolaryngology | Admitting: Otolaryngology

## 2023-01-25 DIAGNOSIS — H9041 Sensorineural hearing loss, unilateral, right ear, with unrestricted hearing on the contralateral side: Secondary | ICD-10-CM

## 2023-01-25 DIAGNOSIS — I6782 Cerebral ischemia: Secondary | ICD-10-CM | POA: Diagnosis not present

## 2023-01-25 DIAGNOSIS — H9312 Tinnitus, left ear: Secondary | ICD-10-CM

## 2023-01-25 MED ORDER — GADOPICLENOL 0.5 MMOL/ML IV SOLN
6.0000 mL | Freq: Once | INTRAVENOUS | Status: AC | PRN
  Administered 2023-01-25: 6 mL via INTRAVENOUS

## 2023-02-07 ENCOUNTER — Other Ambulatory Visit: Payer: Self-pay | Admitting: Internal Medicine

## 2023-02-25 ENCOUNTER — Ambulatory Visit (INDEPENDENT_AMBULATORY_CARE_PROVIDER_SITE_OTHER): Payer: HMO | Admitting: Neurology

## 2023-02-25 ENCOUNTER — Telehealth: Payer: Self-pay | Admitting: Neurology

## 2023-02-25 DIAGNOSIS — G5622 Lesion of ulnar nerve, left upper limb: Secondary | ICD-10-CM

## 2023-02-25 DIAGNOSIS — M5416 Radiculopathy, lumbar region: Secondary | ICD-10-CM

## 2023-02-25 DIAGNOSIS — G629 Polyneuropathy, unspecified: Secondary | ICD-10-CM | POA: Diagnosis not present

## 2023-02-25 DIAGNOSIS — R202 Paresthesia of skin: Secondary | ICD-10-CM

## 2023-02-25 NOTE — Telephone Encounter (Signed)
Results of EMG discussed with patient.  Left ulnar neuropathy is mild, recommend avoid over flexion at the elbow and leaning/resting on elbows.  No evidence of neuropathy in the feet.  She may have a mild pinched nerve and/or symptoms may be due to small fiber neuropathy.  Skin biopsy discussed and decided to hold on additional testing unless symptoms get worse.  Currently, she denies low back pain or radicular leg pain.  She will contact me, if symptoms progress.

## 2023-02-25 NOTE — Procedures (Signed)
[facility]  [address] [place], [address]  [place], [place] [address] Tel: [phone] Fax: [phone] Test Date:  02/25/2023  Patient: [name] DOB: [birth year] Physician: Nita Sickle, DO  Sex: Female Height: 5\' 7"  Ref Phys: Nita Sickle, DO  ID#: [id number]   Technician:    History: This is an 82 year old female referred for evaluation of bilateral feet numbness and left hand numbness/tingling.  NCV & EMG Findings: Extensive electrodiagnostic testing of the left upper and lower extremity shows:  Left ulnar sensory response shows mildly prolonged latency (3.3 ms).  Left median, sural, and superficial peroneal sensory responses are within normal limits. Left ulnar motor response shows slowed conduction velocity across the elbow (A Elbow-B Elbow, 42 m/s).  Left median, peroneal, and tibial motor responses are within normal limits.   Left tibial H reflex study is within normal limits.   Chronic motor axon loss changes are seen affecting the left anterior tibialis and gluteus medius muscles, without accompanying active denervation.    Impression: Chronic left L5 radiculopathy, mild. Left ulnar neuropathy with slowing across the elbow, purely demyelinating, mild. There is no evidence of a large fiber sensorimotor polyneuropathy affecting the left lower extremity.   ___________________________ Nita Sickle, DO    Nerve Conduction Studies   Stim Site NR Peak (ms) Norm Peak (ms) O-P Amp (V) Norm O-P Amp  Left Median Anti Sensory (2nd Digit)  32 C  Wrist    3.6 <3.8 29.7 >10  Left Sup Peroneal Anti Sensory (Ant Lat Mall)  32 C  12 cm    2.3 <4.6 5.2 >3  Left Sural Anti Sensory (Lat Mall)  32 C  Calf    2.8 <4.6 9.8 >3  Left Ulnar Anti Sensory (5th Digit)  32 C  Wrist    *3.3 <3.2 32.7 >5     Stim Site NR Onset (ms) Norm Onset (ms) O-P Amp (mV) Norm O-P Amp Site1 Site2 Delta-0 (ms) Dist (cm) Vel (m/s) Norm Vel (m/s)  Left Median Motor (Abd Poll Brev)  32 C   Wrist    3.1 <4.0 7.7 >5 Elbow Wrist 5.7 31.0 54 >50  Elbow    8.8  7.1         Left Peroneal Motor (Ext Dig Brev)  32 C  Ankle    3.8 <6.0 5.0 >2.5 B Fib Ankle 7.3 32.0 44 >40  B Fib    11.1  4.3  Poplt B Fib 1.6 8.0 50 >40  Poplt    12.7  4.3         Left Tibial Motor (Abd Hall Brev)  32 C  Ankle    4.5 <6.0 13.5 >4 Knee Ankle 9.3 38.0 41 >40  Knee    13.8  10.6         Left Ulnar Motor (Abd Dig Minimi)  32 C  Wrist    2.3 <3.1 8.6 >7 B Elbow Wrist 3.2 20.0 63 >50  B Elbow    5.5  7.8  A Elbow B Elbow 2.4 10.0 *42 >50  A Elbow    7.9  7.5            Stim Site NR Peak (ms) Norm Peak (ms) P-T Amp (V) Site1 Site2 Delta-P (ms) Norm Delta (ms)  Left Median/Ulnar Palm Comparison (Wrist - 8cm)  32 C  Median Palm    1.8 <2.2 71.1 Median Palm Ulnar Palm 0.2   Ulnar Palm    1.6 <2.2 20.1  Electromyography   Side Muscle Ins.Act Fibs Fasc Recrt Amp Dur Poly Activation Comment  Left 1stDorInt Nml Nml Nml Nml Nml Nml Nml Nml N/A  Left PronatorTeres Nml Nml Nml Nml Nml Nml Nml Nml N/A  Left Biceps Nml Nml Nml Nml Nml Nml Nml Nml N/A  Left Triceps Nml Nml Nml Nml Nml Nml Nml Nml N/A  Left Deltoid Nml Nml Nml Nml Nml Nml Nml Nml N/A  Left AntTibialis Nml Nml Nml *1- *1+ *1+ *1+ Nml N/A  Left Gastroc Nml Nml Nml Nml Nml Nml Nml Nml N/A  Left BicepsFemS Nml Nml Nml Nml Nml Nml Nml Nml N/A  Left RectFemoris Nml Nml Nml Nml Nml Nml Nml Nml N/A  Left GluteusMed Nml Nml Nml *1- *1+ *1+ *1+ Nml N/A  Left FlexCarpiUln Nml Nml Nml Nml Nml Nml Nml Nml N/A      Waveforms:

## 2023-03-07 ENCOUNTER — Encounter: Payer: Self-pay | Admitting: Internal Medicine

## 2023-03-07 ENCOUNTER — Ambulatory Visit (INDEPENDENT_AMBULATORY_CARE_PROVIDER_SITE_OTHER): Payer: HMO | Admitting: Internal Medicine

## 2023-03-07 VITALS — BP 120/70 | HR 74 | Temp 98.2°F | Ht 67.0 in | Wt 136.4 lb

## 2023-03-07 DIAGNOSIS — K5900 Constipation, unspecified: Secondary | ICD-10-CM | POA: Diagnosis not present

## 2023-03-07 DIAGNOSIS — I10 Essential (primary) hypertension: Secondary | ICD-10-CM

## 2023-03-07 DIAGNOSIS — Z1231 Encounter for screening mammogram for malignant neoplasm of breast: Secondary | ICD-10-CM

## 2023-03-07 DIAGNOSIS — H9313 Tinnitus, bilateral: Secondary | ICD-10-CM | POA: Diagnosis not present

## 2023-03-07 DIAGNOSIS — E039 Hypothyroidism, unspecified: Secondary | ICD-10-CM | POA: Diagnosis not present

## 2023-03-07 DIAGNOSIS — G609 Hereditary and idiopathic neuropathy, unspecified: Secondary | ICD-10-CM | POA: Diagnosis not present

## 2023-03-07 NOTE — Assessment & Plan Note (Signed)
High fiber, [place] [facility] recommended

## 2023-03-07 NOTE — Assessment & Plan Note (Signed)
Large fiber by EMG/[facility] studies done by Dr Allena Katz in March 2024.  No reversible  causes  found. Gabapentin offered but deferred

## 2023-03-07 NOTE — Assessment & Plan Note (Signed)
Patient is taking her medications as prescribed and notes no adverse effects.  Home BP readings have been done about once per week and are  usually  < 130/80 .  She is avoiding added salt in her diet and walking regularly about 3 times per week for exercise  .

## 2023-03-07 NOTE — Assessment & Plan Note (Signed)
TSH is therapeutic,  Continue current 50 MCG  levothyoxine dose,  Recheck in 6 months   Lab Results  Component Value Date   TSH 2.48 12/02/2022    

## 2023-03-07 NOTE — Progress Notes (Unsigned)
Subjective:  Patient ID: [name], female    DOB: [birth year]  Age: 82 y.o. MRN: [id number]  CC: The primary encounter diagnosis was Encounter for screening mammogram for malignant neoplasm of breast. Diagnoses of Acquired hypothyroidism, Idiopathic peripheral neuropathy, Constipation, unspecified constipation type, Elevated blood pressure reading in office with diagnosis of hypertension, and Tinnitus of both ears were also pertinent to this visit.   HPI [name] presents for follow up on multiple issues  Chief Complaint  Patient presents with   Medical Management of Chronic Issues    3 month follow up    1) tinnitus, hearing loss:  reviewed recent  MRI brain . With IAC's   Getting hearing aides Thursday   2) Neuropathy,  lower extremity  avoiding gabapentin use .  Symptoms are tolerable   3) since  the fall in December  she has altered her activities .  She is now Exercising regularly  4 days per week [facility] [employer] [facility] [employer]   4) Bilateral DJD knees: she received bilateral injections done 2  months ago with good relief    5) Constipation  for the past 2 months.  Dulcolax trial was effective . Marland Kitchen  Reviewed last colonscopy :  TA Jan 2021   6) HTN:  systolic  BP at home on a calibrated machine have been 100 to  120 /70 to 80    Outpatient Medications Prior to Visit  Medication Sig Dispense Refill   aspirin EC 81 MG tablet Take 81 mg by mouth daily.     cholecalciferol (VITAMIN D) 1000 UNITS tablet Take 2,000 Units by mouth daily.      Cranberry 450 MG CAPS Take by mouth.     diltiazem (CARDIZEM CD) 120 MG 24 hr capsule Take 1 capsule (120 mg total) by mouth daily. 90 capsule 3   diphenhydramine-acetaminophen (TYLENOL PM) 25-500 MG TABS tablet Take 1 tablet by mouth at bedtime as needed.     glucosamine-chondroitin 500-400 MG tablet Take 1 tablet by mouth daily.      isosorbide mononitrate (IMDUR) 30 MG 24 hr tablet TAKE 1 TABLET BY MOUTH EVERY DAY 90 tablet 3    levothyroxine (SYNTHROID) 50 MCG tablet TAKE 1 TABLET BY MOUTH DAILY 100 tablet 2   Multiple Vitamins-Minerals (MULTIVITAMIN WITH MINERALS) tablet Take 1 tablet by mouth daily.     NIACINAMIDE PO Take 500 mg by mouth.     Omega-3 Fatty Acids (FISH OIL) 1000 MG CPDR Take by mouth daily.     pantoprazole (PROTONIX) 20 MG tablet TAKE 1 TABLET BY MOUTH EVERY DAY 90 tablet 3   rosuvastatin (CRESTOR) 10 MG tablet TAKE 1 TABLET BY MOUTH DAILY 100 tablet 2   traZODone (DESYREL) 50 MG tablet TAKE 1/2 TO 1 TABLET BY MOUTH AT BEDTIME AS NEEDED FOR SLEEP 90 tablet 2   tiZANidine (ZANAFLEX) 2 MG tablet Take 1 tablet (2 mg total) by mouth at bedtime as needed for muscle spasms. (Patient not taking: Reported on 03/07/2023) 30 tablet 3   No facility-administered medications prior to visit.    Review of Systems;  Patient denies headache, fevers, malaise, unintentional weight loss, skin rash, eye pain, sinus congestion and sinus pain, sore throat, dysphagia,  hemoptysis , cough, dyspnea, wheezing, chest pain, palpitations, orthopnea, edema, abdominal pain, nausea, melena, diarrhea, constipation, flank pain, dysuria, hematuria, urinary  Frequency, nocturia, numbness, tingling, seizures,  Focal weakness, Loss of consciousness,  Tremor, insomnia, depression, anxiety, and suicidal ideation.  Objective:  BP 120/70   Pulse 74   Temp 98.2 F (36.8 C) (Oral)   Ht  (1.702 m)   Wt 136 lb 6.4 oz (61.9 kg)   SpO2 98%   BMI 21.36 kg/m   BP Readings from Last 3 Encounters:  03/07/23 120/70  01/24/23 (!) 152/74  12/28/22 (!) 140/80    Wt Readings from Last 3 Encounters:  03/07/23 136 lb 6.4 oz (61.9 kg)  01/24/23 137 lb (62.1 kg)  12/28/22 139 lb 2 oz (63.1 kg)    Physical Exam Vitals reviewed.  Constitutional:      General: She is not in acute distress.    Appearance: Normal appearance. She is normal weight. She is not ill-appearing, toxic-appearing or diaphoretic.  HENT:     Head:  Normocephalic.  Eyes:     General: No scleral icterus.       Right eye: No discharge.        Left eye: No discharge.     Conjunctiva/sclera: Conjunctivae normal.  Cardiovascular:     Rate and Rhythm: Normal rate and regular rhythm.     Heart sounds: Normal heart sounds.  Pulmonary:     Effort: Pulmonary effort is normal. No respiratory distress.     Breath sounds: Normal breath sounds.  Musculoskeletal:        General: Normal range of motion.  Skin:    General: Skin is warm and dry.  Neurological:     General: No focal deficit present.     Mental Status: She is alert and oriented to person, place, and time. Mental status is at baseline.  Psychiatric:        Mood and Affect: Mood normal.        Behavior: Behavior normal.        Thought Content: Thought content normal.        Judgment: Judgment normal.    Lab Results  Component Value Date   HGBA1C 6.2 12/02/2022   HGBA1C 6.3 06/30/2022   HGBA1C 6.0 12/30/2021    Lab Results  Component Value Date   CREATININE 0.90 12/02/2022   CREATININE 1.12 (H) 11/11/2022   CREATININE 0.92 06/30/2022    Lab Results  Component Value Date   WBC 8.1 11/11/2022   HGB 12.2 11/11/2022   HCT 37.0 11/11/2022   PLT 222 11/11/2022   GLUCOSE 90 12/02/2022   CHOL 180 12/02/2022   TRIG 126 12/02/2022   HDL 72 12/02/2022   LDLDIRECT 85.0 03/06/2018   LDLCALC 85 12/02/2022   ALT 20 12/02/2022   AST 23 12/02/2022   NA 141 12/02/2022   K 4.3 12/02/2022   CL 101 12/02/2022   CREATININE 0.90 12/02/2022   BUN 12 12/02/2022   CO2 32 12/02/2022   TSH 2.48 12/02/2022   HGBA1C 6.2 12/02/2022   MICROALBUR 1.3 12/02/2022    MR BRAIN/IAC W WO CONTRAST  Result Date: 01/26/2023 CLINICAL DATA:  Asymmetrical sensorineural hearing loss.  Tinnitus. EXAM: MRI HEAD WITHOUT AND WITH CONTRAST TECHNIQUE: Multiplanar, multiecho pulse sequences of the brain and surrounding structures were obtained without and with intravenous contrast. CONTRAST:  6 mL Vueway  COMPARISON:  Head CT 11/11/2022 FINDINGS: Brain: There is no evidence of an acute infarct, intracranial hemorrhage, mass, midline shift, or extra-axial fluid collection. Small T2 hyperintensities in the cerebral white matter nonspecific but compatible with mild chronic small vessel ischemic disease. The ventricles and sulci are within normal limits for age. No abnormal enhancement is identified. Dedicated imaging through  the internal auditory canals demonstrates a normal course of cranial nerves VII and VIII without evidence of a mass or abnormal enhancement. The inner ear structures demonstrate normal signal and morphology bilaterally. No mass is present in the cerebellopontine angles. Vascular: Major intracranial vascular flow voids are preserved. Skull and upper cervical spine: Unremarkable bone marrow signal. Sinuses/Orbits: Minimal mucosal thickening in the right maxillary sinus. Clear mastoid air cells. Bilateral cataract extraction. Other: None. IMPRESSION: 1. Negative internal auditory canal imaging. No retrocochlear lesion. 2. Mild chronic small vessel ischemic disease. Electronically Signed   By: Sebastian Ache M.D.   On: 01/26/2023 09:28    Assessment & Plan:  .Encounter for screening mammogram for malignant neoplasm of breast -     3D Screening Mammogram, Left and Right; Future  Acquired hypothyroidism Assessment & Plan: TSH is therapeutic,  Continue current 50 MCG  levothyoxine dose,  Recheck in 6 months   Lab Results  Component Value Date   TSH 2.48 12/02/2022      Idiopathic peripheral neuropathy Assessment & Plan: Large fiber by EMG/[place] studies done by Dr Allena Katz in March 2024.  No reversible  causes  found. Gabapentin offered but deferred    Constipation, unspecified constipation type Assessment & Plan: High fiber, /Benefiber recommended    Elevated blood pressure reading in office with diagnosis of hypertension Assessment & Plan: Patient is taking her medications as  prescribed and notes no adverse effects.  Home BP readings have been done about once per week and are  usually  < 130/80 .  She is avoiding added salt in her diet and exercising  4 times per week for exercise  .    Tinnitus of both ears Assessment & Plan: Secondary to hearing loss.  MRI Brain wit IAC's done , no acoustic schwannoma  . She has ordered hearing aides      I provided 30 minutes of face-to-face time during this encounter reviewing patient's last visit with me, patient's  most recent visit with ENT, cardiology,  neurology,  recent EMG/nerve conduction studies,  MRI,  labs , counseling on currently addressed issues,  and post visit ordering to diagnostics and therapeutics .   Follow-up: No follow-ups on file.   Sherlene Shams, MD

## 2023-03-07 NOTE — Patient Instructions (Addendum)
YOUR MAMMOGRAM IS DUE around May 15th, PLEASE CALL AND GET THIS SCHEDULED! [facility] - call [phone]    For your constipation I recommend that you taking a daily dose of  Benefiber with prebiotics   You can also use these high fiber flatbreads/pitas   HERE ARE THE LOW CARB  BREAD CHOICES

## 2023-03-08 NOTE — Assessment & Plan Note (Addendum)
Secondary to hearing loss.  MRI Brain wit IAC's done , no acoustic schwannoma  . She has ordered hearing aides

## 2023-03-09 ENCOUNTER — Telehealth: Payer: Self-pay | Admitting: Cardiovascular Disease

## 2023-03-09 DIAGNOSIS — K449 Diaphragmatic hernia without obstruction or gangrene: Secondary | ICD-10-CM

## 2023-03-09 NOTE — Telephone Encounter (Signed)
Please advise if ok to refill or defer to PCP. Thank you so much.

## 2023-03-09 NOTE — Telephone Encounter (Signed)
Dr. Mariah Milling- do you want to continue to fill protonix. It looks like [name] started this on 12/22/20 as a trial to see if this would help with chest pain and known small hiatal hernia.  It has continued to be refilled under cardiology since.   Please advise.

## 2023-03-10 NOTE — Telephone Encounter (Signed)
Requested Prescriptions   Signed Prescriptions Disp Refills   pantoprazole (PROTONIX) 20 MG tablet 90 tablet 0    Sig: TAKE 1 TABLET BY MOUTH EVERY DAY    Authorizing Provider: Antonieta Iba    Ordering User: NEWCOMER MCCLAIN, Kameria Canizares L

## 2023-03-10 NOTE — Telephone Encounter (Signed)
Reill sent with note advising future refills to be provided by PCP

## 2023-04-09 NOTE — Progress Notes (Unsigned)
04/02/2022 4:52 PM   [name] 09-03-1941 [id number]  Referring provider:  Sherlene Shams, MD [address] [place],  [place] [address]  Urological history  1. Pelvic prolapse -~10 years ago -atrophic decent of vaginal cuff to level of introitus on 2023 exam  2. Urethral caruncle -vaginal estrogen cream three nights weekly ***  No chief complaint on file.   HPI: [name] is a 82 y.o.female who presents today for one year follow up.     PMH: Past Medical History:  Diagnosis Date   Arthritis    knees   Basal cell carcinoma 10/23/2020   right lateral ankle EDC done 12/18/20   Basal cell carcinoma 05/13/2021   right pretibia, [facility] 07/22/21   Basal cell carcinoma 12/03/2021   left chest, EDC 01/06/2022   Basal cell carcinoma (BCC) 05/13/2021   left forehead, Moh's 07/29/2021   Candida esophagitis (HCC) 06/30/2021   E. coli UTI 09/10/2015   Resistant to cipro .  Keflex sent x 5 days    History of dysplastic nevus 05/13/2021   right dorsal hand, mild   Hyperlipidemia    Hypothyroidism    Laceration of head without foreign body 11/18/2022   PVC's (premature ventricular contractions)    a.  48-hour Holter in 3/17 demonstrated NSR, frequent PVCs totaling 5300 beats representing 3% burden, occasional PACs, short runs of SVT    Surgical History: Past Surgical History:  Procedure Laterality Date   ABDOMINAL HYSTERECTOMY     In 30's   BROW LIFT Bilateral 07/12/2017   Procedure: BLEPHAROPLASTY upper eyelid with excess skin;  Surgeon: Imagene Riches, MD;  Location: [facility];  Service: Ophthalmology;  Laterality: Bilateral;  MAC   COLONOSCOPY WITH PROPOFOL N/A 12/29/2015   Procedure: COLONOSCOPY WITH PROPOFOL;  Surgeon: Wallace Cullens, MD;  Location: [facility];  Service: Gastroenterology;  Laterality: N/A;   COLONOSCOPY WITH PROPOFOL N/A 12/03/2019   Procedure: COLONOSCOPY WITH BIOPSY;  Surgeon: Midge Minium, MD;  Location: [facility];   Service: Endoscopy;  Laterality: N/A;   ESOPHAGOGASTRODUODENOSCOPY (EGD) WITH PROPOFOL N/A 06/25/2021   Procedure: ESOPHAGOGASTRODUODENOSCOPY (EGD) WITH PROPOFOL;  Surgeon: Midge Minium, MD;  Location: [facility];  Service: Endoscopy;  Laterality: N/A;   POLYPECTOMY N/A 12/03/2019   Procedure: POLYPECTOMY;  Surgeon: Midge Minium, MD;  Location: [facility];  Service: Endoscopy;  Laterality: N/A;   PTOSIS REPAIR Bilateral 07/12/2017   Procedure: ptosis repair resect ex;  Surgeon: Imagene Riches, MD;  Location: [facility];  Service: Ophthalmology;  Laterality: Bilateral;  Requests early   VAGINAL PROLAPSE REPAIR  2014   Dr. Greggory Keen    Home Medications:  Allergies as of 04/11/2023       Reactions   Prednisone    "crazy"        Medication List        Accurate as of Apr 09, 2023  4:52 PM. If you have any questions, ask your nurse or doctor.          aspirin EC 81 MG tablet Take 81 mg by mouth daily.   cholecalciferol 1000 units tablet Commonly known as: VITAMIN D Take 2,000 Units by mouth daily.   Cranberry 450 MG Caps Take by mouth.   diltiazem 120 MG 24 hr capsule Commonly known as: CARDIZEM CD Take 1 capsule (120 mg total) by mouth daily.   diphenhydramine-acetaminophen 25-500 MG Tabs tablet Commonly known as: TYLENOL PM Take 1 tablet by mouth at  bedtime as needed.   Fish Oil 1000 MG Cpdr Take by mouth daily.   glucosamine-chondroitin 500-400 MG tablet Take 1 tablet by mouth daily.   isosorbide mononitrate 30 MG 24 hr tablet Commonly known as: IMDUR TAKE 1 TABLET BY MOUTH EVERY DAY   levothyroxine 50 MCG tablet Commonly known as: SYNTHROID TAKE 1 TABLET BY MOUTH DAILY   multivitamin with minerals tablet Take 1 tablet by mouth daily.   NIACINAMIDE PO Take 500 mg by mouth.   pantoprazole 20 MG tablet Commonly known as: PROTONIX TAKE 1 TABLET BY MOUTH EVERY DAY   rosuvastatin 10 MG tablet Commonly known as: CRESTOR TAKE 1  TABLET BY MOUTH DAILY   traZODone 50 MG tablet Commonly known as: DESYREL TAKE 1/2 TO 1 TABLET BY MOUTH AT BEDTIME AS NEEDED FOR SLEEP        Allergies:  Allergies  Allergen Reactions   Prednisone     "crazy"    Family History: Family History  Problem Relation Age of Onset   Breast cancer Mother        70's   Heart attack Mother    Diabetes Mother    Stroke Father    Diabetes Father    Pancreatic cancer Sister    Cancer Sister        pancreatic   Cancer Sister        anal   Breast cancer Sister    Breast cancer Sister    Cancer Brother        liver   Diabetes Brother    Heart attack Brother    Diabetes Brother    Cancer Brother        stomach   Cancer Brother    Diabetes Brother    Colon cancer Neg Hx     Social History:  reports that she has quit smoking. Her smoking use included cigarettes. She has been exposed to tobacco smoke. She has never used smokeless tobacco. She reports that she does not drink alcohol and does not use drugs.   Physical Exam: There were no vitals taken for this visit.  Constitutional:  Well nourished. Alert and oriented, No acute distress. HEENT: [place] AT, moist mucus membranes.  Trachea midline, no masses. Cardiovascular: No clubbing, cyanosis, or edema. Respiratory: Normal respiratory effort, no increased work of breathing. GU: No CVA tenderness.  No bladder fullness or masses. Vulvovaginal atrophy w/ pallor, loss of rugae, introital retraction, excoriations.  Vulvar thinning, fusion of labia, clitoral hood retraction, prominent urethral meatus.   *** external genitalia, *** pubic hair distribution, no lesions.  Normal urethral meatus, no lesions, no prolapse, no discharge.   No urethral masses, tenderness and/or tenderness. No bladder fullness, tenderness or masses. *** vagina mucosa, *** estrogen effect, no discharge, no lesions, *** pelvic support, *** cystocele and *** rectocele noted.  No cervical motion tenderness.  Uterus is freely  mobile and non-fixed.  No adnexal/parametria masses or tenderness noted.  Anus and perineum are without rashes or lesions.   ***  Neurologic: Grossly intact, no focal deficits, moving all 4 extremities. Psychiatric: Normal mood and affect.    Laboratory Data: Lab Results  Component Value Date   CREATININE 0.90 12/02/2022   Lab Results  Component Value Date   HGBA1C 6.2 12/02/2022  I have reviewed the labs.   Pertinent Imaging N/A  Assessment & Plan:    1. GSM   Cloretta Ned  [facility] [address], [address] [place],  [place] [address] [phone]

## 2023-04-11 ENCOUNTER — Ambulatory Visit (INDEPENDENT_AMBULATORY_CARE_PROVIDER_SITE_OTHER): Payer: HMO | Admitting: Urology

## 2023-04-11 ENCOUNTER — Encounter: Payer: Self-pay | Admitting: Urology

## 2023-04-11 VITALS — BP 136/65 | HR 79 | Ht 67.0 in | Wt 135.0 lb

## 2023-04-11 DIAGNOSIS — N952 Postmenopausal atrophic vaginitis: Secondary | ICD-10-CM

## 2023-04-11 DIAGNOSIS — N3946 Mixed incontinence: Secondary | ICD-10-CM | POA: Diagnosis not present

## 2023-04-12 ENCOUNTER — Ambulatory Visit
Admission: RE | Admit: 2023-04-12 | Discharge: 2023-04-12 | Disposition: A | Discharge status: Home / Self Care | Payer: HMO | Source: Ambulatory Visit | Attending: Internal Medicine | Admitting: Internal Medicine

## 2023-04-12 DIAGNOSIS — Z1231 Encounter for screening mammogram for malignant neoplasm of breast: Secondary | ICD-10-CM | POA: Diagnosis not present

## 2023-05-16 DIAGNOSIS — M25659 Stiffness of unspecified hip, not elsewhere classified: Secondary | ICD-10-CM | POA: Diagnosis not present

## 2023-05-16 DIAGNOSIS — M160 Bilateral primary osteoarthritis of hip: Secondary | ICD-10-CM | POA: Diagnosis not present

## 2023-05-16 DIAGNOSIS — M170 Bilateral primary osteoarthritis of knee: Secondary | ICD-10-CM | POA: Diagnosis not present

## 2023-06-08 DIAGNOSIS — M170 Bilateral primary osteoarthritis of knee: Secondary | ICD-10-CM | POA: Diagnosis not present

## 2023-06-09 ENCOUNTER — Other Ambulatory Visit: Payer: Self-pay | Admitting: Cardiovascular Disease

## 2023-06-09 DIAGNOSIS — K449 Diaphragmatic hernia without obstruction or gangrene: Secondary | ICD-10-CM

## 2023-06-13 ENCOUNTER — Ambulatory Visit: Payer: HMO | Admitting: Neurology

## 2023-06-14 ENCOUNTER — Telehealth: Payer: Self-pay | Admitting: Cardiovascular Disease

## 2023-06-14 ENCOUNTER — Telehealth: Payer: Self-pay | Admitting: *Deleted

## 2023-06-14 NOTE — Telephone Encounter (Signed)
  Patient Consent for Virtual Visit         [name] has provided verbal consent on 06/14/2023 for a virtual visit (video or telephone).   CONSENT FOR VIRTUAL VISIT FOR:  [name]  By participating in this virtual visit I agree to the following:  I hereby voluntarily request, consent and authorize [facility] and its employed or contracted physicians, physician assistants, nurse practitioners or other licensed health care professionals (the Practitioner), to provide me with telemedicine health care services (the "Services") as deemed necessary by the treating Practitioner. I acknowledge and consent to receive the Services by the Practitioner via telemedicine. I understand that the telemedicine visit will involve communicating with the Practitioner through live audiovisual communication technology and the disclosure of certain medical information by electronic transmission. I acknowledge that I have been given the opportunity to request an in-person assessment or other available alternative prior to the telemedicine visit and am voluntarily participating in the telemedicine visit.  I understand that I have the right to withhold or withdraw my consent to the use of telemedicine in the course of my care at any time, without affecting my right to future care or treatment, and that the Practitioner or I may terminate the telemedicine visit at any time. I understand that I have the right to inspect all information obtained and/or recorded in the course of the telemedicine visit and may receive copies of available information for a reasonable fee.  I understand that some of the potential risks of receiving the Services via telemedicine include:  Delay or interruption in medical evaluation due to technological equipment failure or disruption; Information transmitted may not be sufficient (e.g. poor resolution of images) to allow for appropriate medical decision making by the  Practitioner; and/or  In rare instances, security protocols could fail, causing a breach of personal health information.  Furthermore, I acknowledge that it is my responsibility to provide information about my medical history, conditions and care that is complete and accurate to the best of my ability. I acknowledge that Practitioner's advice, recommendations, and/or decision may be based on factors not within their control, such as incomplete or inaccurate data provided by me or distortions of diagnostic images or specimens that may result from electronic transmissions. I understand that the practice of medicine is not an exact science and that Practitioner makes no warranties or guarantees regarding treatment outcomes. I acknowledge that a copy of this consent can be made available to me via my patient portal [facility] MyChart), or I can request a printed copy by calling the office of [facility].    I understand that my insurance will be billed for this visit.   I have read or had this consent read to me. I understand the contents of this consent, which adequately explains the benefits and risks of the Services being provided via telemedicine.  I have been provided ample opportunity to ask questions regarding this consent and the Services and have had my questions answered to my satisfaction. I give my informed consent for the services to be provided through the use of telemedicine in my medical care

## 2023-06-14 NOTE — Telephone Encounter (Signed)
   Pre-operative Risk Assessment    Patient Name: [name]  DOB: 1940/12/17 MRN: [id number]      Request for Surgical Clearance    Procedure:   RIGHT TKA  Date of Surgery:  Clearance 07/11/23                               Surgeon:  DR Reinaldo Berber Surgeon's Group or Practice Name:  [facility] Phone number:  [phone] Fax number:  [phone]  Type of Clearance Requested:   - Medical    Type of Anesthesia:  Not Indicated   Additional requests/questions:    Signed, Dalia Heading   06/14/2023, 8:29 AM

## 2023-06-14 NOTE — Telephone Encounter (Signed)
   Name: [name]  DOB: 1941-08-27  MRN: [id number]  Primary Cardiologist: Julien Nordmann, MD   Preoperative team, please contact this patient and set up a phone call appointment for further preoperative risk assessment. Please obtain consent and complete medication review. Thank you for your help.  I confirm that guidance regarding antiplatelet and oral anticoagulation therapy has been completed and, if necessary, noted below.  Per office protocol, if patient is without any new symptoms or concerns at the time of their virtual visit, she may hold Aspirin for 5-7 days prior to procedure. Please resume Aspirin as soon as possible postprocedure, at the discretion of the surgeon.     Joylene Grapes, NP 06/14/2023, 9:00 AM [facility]

## 2023-06-15 ENCOUNTER — Ambulatory Visit (INDEPENDENT_AMBULATORY_CARE_PROVIDER_SITE_OTHER): Payer: HMO | Admitting: Internal Medicine

## 2023-06-15 ENCOUNTER — Encounter: Payer: Self-pay | Admitting: Internal Medicine

## 2023-06-15 ENCOUNTER — Ambulatory Visit: Payer: HMO

## 2023-06-15 VITALS — BP 130/78 | HR 75 | Temp 98.0°F | Ht 67.0 in | Wt 135.0 lb

## 2023-06-15 DIAGNOSIS — R918 Other nonspecific abnormal finding of lung field: Secondary | ICD-10-CM | POA: Diagnosis not present

## 2023-06-15 DIAGNOSIS — E7849 Other hyperlipidemia: Secondary | ICD-10-CM | POA: Diagnosis not present

## 2023-06-15 DIAGNOSIS — I10 Essential (primary) hypertension: Secondary | ICD-10-CM | POA: Diagnosis not present

## 2023-06-15 DIAGNOSIS — R7303 Prediabetes: Secondary | ICD-10-CM

## 2023-06-15 DIAGNOSIS — Z01818 Encounter for other preprocedural examination: Secondary | ICD-10-CM

## 2023-06-15 DIAGNOSIS — E039 Hypothyroidism, unspecified: Secondary | ICD-10-CM | POA: Diagnosis not present

## 2023-06-15 LAB — TSH: TSH: 2.05 u[IU]/mL (ref 0.35–5.50)

## 2023-06-15 LAB — COMPREHENSIVE METABOLIC PANEL
ALT: 18 U/L (ref 0–35)
AST: 22 U/L (ref 0–37)
Albumin: 4.2 g/dL (ref 3.5–5.2)
Alkaline Phosphatase: 32 U/L — ABNORMAL LOW (ref 39–117)
BUN: 14 mg/dL (ref 6–23)
CO2: 31 mEq/L (ref 19–32)
Calcium: 9.7 mg/dL (ref 8.4–10.5)
Chloride: 100 mEq/L (ref 96–112)
Creatinine, Ser: 0.91 mg/dL (ref 0.40–1.20)
GFR: 58.94 mL/min — ABNORMAL LOW (ref >60.00)
Glucose, Bld: 98 mg/dL (ref 70–99)
Potassium: 4.4 mEq/L (ref 3.5–5.1)
Sodium: 138 mEq/L (ref 135–145)
Total Bilirubin: 0.4 mg/dL (ref 0.2–1.2)
Total Protein: 7.1 g/dL (ref 6.0–8.3)

## 2023-06-15 LAB — LIPID PANEL
Cholesterol: 187 mg/dL (ref 0–200)
HDL: 63.1 mg/dL (ref >39.00)
LDL Cholesterol: 99 mg/dL (ref 0–99)
NonHDL: 123.84
Total CHOL/HDL Ratio: 3
Triglycerides: 124 mg/dL (ref 0.0–149.0)
VLDL: 24.8 mg/dL (ref 0.0–40.0)

## 2023-06-15 LAB — HEMOGLOBIN A1C: Hgb A1c MFr Bld: 6 % (ref 4.6–6.5)

## 2023-06-15 LAB — LDL CHOLESTEROL, DIRECT: Direct LDL: 93 mg/dL

## 2023-06-15 NOTE — Progress Notes (Unsigned)
Subjective:  Patient ID: [name], female    DOB: 1941/03/12  Age: 82 y.o. MRN: [id number]  CC: The primary encounter diagnosis was Acquired hypothyroidism. Diagnoses of Familial hyperlipidemia, Prediabetes, Preoperative evaluation to rule out surgical contraindication, and Elevated blood pressure reading in office with diagnosis of hypertension were also pertinent to this visit.   HPI ALONI CHUANG presents for MEDICAL CLEARANCE  Chief Complaint  Patient presents with   Medical Management of Chronic Issues    3 month follow up     [name] is an 82 yr old female with hypertension and prediabetes who presents for preoperative medical clearance, requested by her orthopedist, for future right TKA.   She has no evidence of  CAD by prior noninvasive cardiac workup,  and her syncopal episode in Dec 2023 wsas attributed to hypotension , iatrogenic    she is taking both BP meds at night  (imdur and diltiazem ) and has not had any subsequent events.   No cough, or chest pain In the last 6 weeks .  No history of intolerance to general anesthesia .  Will be getting an epidural .   Outpatient Medications Prior to Visit  Medication Sig Dispense Refill   aspirin EC 81 MG tablet Take 81 mg by mouth daily.     cholecalciferol (VITAMIN D) 1000 UNITS tablet Take 2,000 Units by mouth daily.      Cranberry 450 MG CAPS Take by mouth.     diltiazem (CARDIZEM CD) 120 MG 24 hr capsule Take 1 capsule (120 mg total) by mouth daily. 90 capsule 3   diphenhydramine-acetaminophen (TYLENOL PM) 25-500 MG TABS tablet Take 1 tablet by mouth at bedtime as needed.     glucosamine-chondroitin 500-400 MG tablet Take 1 tablet by mouth daily.      isosorbide mononitrate (IMDUR) 30 MG 24 hr tablet TAKE 1 TABLET BY MOUTH EVERY DAY 90 tablet 3   levothyroxine (SYNTHROID) 50 MCG tablet TAKE 1 TABLET BY MOUTH DAILY 100 tablet 2   Multiple Vitamins-Minerals (MULTIVITAMIN WITH MINERALS) tablet Take 1 tablet by mouth  daily.     Omega-3 Fatty Acids (FISH OIL) 1000 MG CPDR Take by mouth daily.     pantoprazole (PROTONIX) 20 MG tablet TAKE 1 TABLET BY MOUTH EVERY DAY 90 tablet 2   rosuvastatin (CRESTOR) 10 MG tablet TAKE 1 TABLET BY MOUTH DAILY 100 tablet 2   traZODone (DESYREL) 50 MG tablet TAKE 1/2 TO 1 TABLET BY MOUTH AT BEDTIME AS NEEDED FOR SLEEP 90 tablet 2   No facility-administered medications prior to visit.    Review of Systems;  Patient denies headache, fevers, malaise, unintentional weight loss, skin rash, eye pain, sinus congestion and sinus pain, sore throat, dysphagia,  hemoptysis , cough, dyspnea, wheezing, chest pain, palpitations, orthopnea, edema, abdominal pain, nausea, melena, diarrhea, constipation, flank pain, dysuria, hematuria, urinary  Frequency, nocturia, numbness, tingling, seizures,  Focal weakness, Loss of consciousness,  Tremor, insomnia, depression, anxiety, and suicidal ideation.      Objective:  BP 130/78   Pulse 75   Temp 98 F (36.7 C) (Oral)   Ht 5\' 7"  (1.702 m)   Wt 135 lb (61.2 kg)   SpO2 98%   BMI 21.14 kg/m   BP Readings from Last 3 Encounters:  06/15/23 130/78  04/11/23 136/65  03/07/23 120/70    Wt Readings from Last 3 Encounters:  06/15/23 135 lb (61.2 kg)  04/11/23 135 lb (61.2 kg)  03/07/23 136 lb 6.4 oz (  61.9 kg)    Physical Exam Vitals reviewed.  Constitutional:      General: She is not in acute distress.    Appearance: Normal appearance. She is normal weight. She is not ill-appearing, toxic-appearing or diaphoretic.  HENT:     Head: Normocephalic.  Eyes:     General: No scleral icterus.       Right eye: No discharge.        Left eye: No discharge.     Conjunctiva/sclera: Conjunctivae normal.  Cardiovascular:     Rate and Rhythm: Normal rate and regular rhythm.     Heart sounds: Normal heart sounds.  Pulmonary:     Effort: Pulmonary effort is normal. No respiratory distress.     Breath sounds: Normal breath sounds.   Musculoskeletal:        General: Normal range of motion.  Skin:    General: Skin is warm and dry.  Neurological:     General: No focal deficit present.     Mental Status: She is alert and oriented to person, place, and time. Mental status is at baseline.  Psychiatric:        Mood and Affect: Mood normal.        Behavior: Behavior normal.        Thought Content: Thought content normal.        Judgment: Judgment normal.    Lab Results  Component Value Date   HGBA1C 6.0 06/15/2023   HGBA1C 6.2 12/02/2022   HGBA1C 6.3 06/30/2022    Lab Results  Component Value Date   CREATININE 0.91 06/15/2023   CREATININE 0.90 12/02/2022   CREATININE 1.12 (H) 11/11/2022    Lab Results  Component Value Date   WBC 8.1 11/11/2022   HGB 12.2 11/11/2022   HCT 37.0 11/11/2022   PLT 222 11/11/2022   GLUCOSE 98 06/15/2023   CHOL 187 06/15/2023   TRIG 124.0 06/15/2023   HDL 63.10 06/15/2023   LDLDIRECT 93.0 06/15/2023   LDLCALC 99 06/15/2023   ALT 18 06/15/2023   AST 22 06/15/2023   NA 138 06/15/2023   K 4.4 06/15/2023   CL 100 06/15/2023   CREATININE 0.91 06/15/2023   BUN 14 06/15/2023   CO2 31 06/15/2023   TSH 2.05 06/15/2023   HGBA1C 6.0 06/15/2023   MICROALBUR 1.3 12/02/2022    MM 3D SCREENING MAMMOGRAM BILATERAL BREAST  Result Date: 04/13/2023 CLINICAL DATA:  Screening. EXAM: DIGITAL SCREENING BILATERAL MAMMOGRAM WITH TOMOSYNTHESIS AND CAD TECHNIQUE: Bilateral screening digital craniocaudal and mediolateral oblique mammograms were obtained. Bilateral screening digital breast tomosynthesis was performed. The images were evaluated with computer-aided detection. COMPARISON:  Previous exam(s). ACR Breast Density Category b: There are scattered areas of fibroglandular density. FINDINGS: There are no findings suspicious for malignancy. IMPRESSION: No mammographic evidence of malignancy. A result letter of this screening mammogram will be mailed directly to the patient. RECOMMENDATION:  Screening mammogram in one year. (Code:SM-B-01Y) BI-RADS CATEGORY  1: Negative. Electronically Signed   By: Baird Lyons M.D.   On: 04/13/2023 15:33    Assessment & Plan:  .Acquired hypothyroidism Assessment & Plan: TSH is therapeutic,  Continue current 50 MCG  levothyoxine dose,  Recheck in 6 months   Lab Results  Component Value Date   TSH 2.05 06/15/2023     Orders: -     TSH -     Hemoglobin A1c  Familial hyperlipidemia -     Lipid panel -     LDL cholesterol, direct  Prediabetes -  Comprehensive metabolic panel -     Hemoglobin A1c  Preoperative evaluation to rule out surgical contraindication Assessment & Plan: I have ordered and reviewed a 12 lead EKG and find that there are no acute changes compared to Dec 28 2022,  and patient is in sinus rhythm.  I have reviewed her chest x ray and it is normal.  Patient  is considered to be at low risk  For perioperative complications     Lab Results  Component Value Date   HGBA1C 6.0 06/15/2023   Lab Results  Component Value Date   CREATININE 0.91 06/15/2023   Lab Results  Component Value Date   NA 138 06/15/2023   K 4.4 06/15/2023   CL 100 06/15/2023   CO2 31 06/15/2023      Orders: -     EKG 12-Lead -     DG Chest 2 View; Future  Elevated blood pressure reading in office with diagnosis of hypertension Assessment & Plan: Patient is taking her medications as prescribed and notes no adverse effects.  Home BP readings have been done about once per week and are  usually  < 130/80 .  She is avoiding added salt in her diet and exercising  4 times per week for exercise  .       I provided 30 minutes of face-to-face time during this encounter reviewing patient's last visit with me, patient's  most recent visit with cardiology,  nephrology,  and neurology,  recent surgical and non surgical procedures, previous  labs and imaging studies, counseling on currently addressed issues,  and post visit ordering to diagnostics  and therapeutics .   Follow-up: No follow-ups on file.   Sherlene Shams, MD

## 2023-06-15 NOTE — Assessment & Plan Note (Signed)
I have ordered and reviewed a 12 lead EKG and find that there are no acute changes compared to Dec 28 2022,  and patient is in sinus rhythm.  I have reviewed her chest x ray and it is normal.  Patient  is considered to be at low risk  For perioperative complications     Lab Results  Component Value Date   HGBA1C 6.0 06/15/2023   Lab Results  Component Value Date   CREATININE 0.91 06/15/2023   Lab Results  Component Value Date   NA 138 06/15/2023   K 4.4 06/15/2023   CL 100 06/15/2023   CO2 31 06/15/2023

## 2023-06-15 NOTE — Patient Instructions (Signed)
OI

## 2023-06-16 NOTE — Assessment & Plan Note (Signed)
Patient is taking her medications as prescribed and notes no adverse effects.  Home BP readings have been done about once per week and are  usually  < 130/80 .  She is avoiding added salt in her diet and exercising  4 times per week for exercise  .

## 2023-06-16 NOTE — Assessment & Plan Note (Signed)
TSH is therapeutic,  Continue current 50 MCG  levothyoxine dose,  Recheck in 6 months   Lab Results  Component Value Date   TSH 2.05 06/15/2023

## 2023-06-27 ENCOUNTER — Other Ambulatory Visit: Payer: Self-pay | Admitting: Orthopedic Surgery

## 2023-06-27 ENCOUNTER — Telehealth: Payer: Self-pay | Admitting: Internal Medicine

## 2023-06-27 MED ORDER — LEVOTHYROXINE SODIUM 50 MCG PO TABS: ORAL_TABLET | ORAL | 2 refills | Status: DC

## 2023-06-27 MED ORDER — ROSUVASTATIN CALCIUM 10 MG PO TABS: 10.0000 mg | ORAL_TABLET | Freq: Every day | ORAL | 2 refills | Status: DC

## 2023-06-27 NOTE — Telephone Encounter (Signed)
Prescription Request  06/27/2023  LOV: 06/15/2023  What is the name of the medication or equipment? levothyroxine and rosuvastatin   Have you contacted your pharmacy to request a refill? No   Which pharmacy would you like this sent to? [facility] [place]  Patient notified that their request is being sent to the clinical staff for review and that they should receive a response within 2 business days.   Please advise at Mobile [phone] (mobile)

## 2023-06-27 NOTE — Telephone Encounter (Signed)
Medications have been refilled ?

## 2023-07-01 ENCOUNTER — Ambulatory Visit: Payer: HMO | Attending: Cardiology

## 2023-07-01 DIAGNOSIS — Z01818 Encounter for other preprocedural examination: Secondary | ICD-10-CM

## 2023-07-01 DIAGNOSIS — Z01810 Encounter for preprocedural cardiovascular examination: Secondary | ICD-10-CM | POA: Diagnosis not present

## 2023-07-01 NOTE — Progress Notes (Signed)
Virtual Visit via Telephone Note   Because of [name]'s co-morbid illnesses, she is at least at moderate risk for complications without adequate follow up.  This format is felt to be most appropriate for this patient at this time.  The patient did not have access to video technology/had technical difficulties with video requiring transitioning to audio format only (telephone).  All issues noted in this document were discussed and addressed.  No physical exam could be performed with this format.  Please refer to the patient's chart for her consent to telehealth for [facility].  Evaluation Performed:  Preoperative cardiovascular risk assessment _____________   Date:  07/01/2023   Patient ID:  [name], DOB 1941/05/12, MRN [id number] Patient Location:  Home Provider location:   Office  Primary Care Provider:  Sherlene Shams, MD Primary Cardiologist:  Julien Nordmann, MD  Chief Complaint / Patient Profile   82 y.o. y/o female with a h/o symptomatic PVCs, hypothyroidism, hyperlipidemia, episode of syncope due to higher levels of antihypertensives which have been adjusted on office visit 12/28/2022.   She is pending right total knee, on 07/11/2023, by Dr. Marcellina Millin with [facility] and presents today for telephonic preoperative cardiovascular risk assessment.  History of Present Illness    [name] is a 82 y.o. female who presents via audio/video conferencing for a telehealth visit today.  Pt was last seen in cardiology clinic on 12/28/2022 by Gollan.  At that time [name] was doing well   The patient is now pending procedure as outlined above. Since her last visit, she has done well, no complaints of palpitations, heart racing, dyspnea on exertion, chest discomfort, dizziness, or edema.  She has been medically compliant.  Past Medical History    Past Medical History:  Diagnosis Date   Arthritis    knees   Basal cell  carcinoma 10/23/2020   right lateral ankle EDC done 12/18/20   Basal cell carcinoma 05/13/2021   right pretibia, [facility] 07/22/21   Basal cell carcinoma 12/03/2021   left chest, EDC 01/06/2022   Basal cell carcinoma (BCC) 05/13/2021   left forehead, Moh's 07/29/2021   Candida esophagitis (HCC) 06/30/2021   E. coli UTI 09/10/2015   Resistant to cipro .  Keflex sent x 5 days    History of dysplastic nevus 05/13/2021   right dorsal hand, mild   Hyperlipidemia    Hypothyroidism    Laceration of head without foreign body 11/18/2022   PVC's (premature ventricular contractions)    a.  48-hour Holter in 3/17 demonstrated NSR, frequent PVCs totaling 5300 beats representing 3% burden, occasional PACs, short runs of SVT   Past Surgical History:  Procedure Laterality Date   ABDOMINAL HYSTERECTOMY     In 30's   BROW LIFT Bilateral 07/12/2017   Procedure: BLEPHAROPLASTY upper eyelid with excess skin;  Surgeon: Imagene Riches, MD;  Location: [facility];  Service: Ophthalmology;  Laterality: Bilateral;  MAC   COLONOSCOPY WITH PROPOFOL N/A 12/29/2015   Procedure: COLONOSCOPY WITH PROPOFOL;  Surgeon: Wallace Cullens, MD;  Location: [facility];  Service: Gastroenterology;  Laterality: N/A;   COLONOSCOPY WITH PROPOFOL N/A 12/03/2019   Procedure: COLONOSCOPY WITH BIOPSY;  Surgeon: Midge Minium, MD;  Location: [facility];  Service: Endoscopy;  Laterality: N/A;   ESOPHAGOGASTRODUODENOSCOPY (EGD) WITH PROPOFOL N/A 06/25/2021   Procedure: ESOPHAGOGASTRODUODENOSCOPY (EGD) WITH PROPOFOL;  Surgeon: Midge Minium, MD;  Location: [facility];  Service: Endoscopy;  Laterality:  N/A;   POLYPECTOMY N/A 12/03/2019   Procedure: POLYPECTOMY;  Surgeon: Midge Minium, MD;  Location: [facility];  Service: Endoscopy;  Laterality: N/A;   PTOSIS REPAIR Bilateral 07/12/2017   Procedure: ptosis repair resect ex;  Surgeon: Imagene Riches, MD;  Location: [facility];  Service: Ophthalmology;  Laterality:  Bilateral;  Requests early   VAGINAL PROLAPSE REPAIR  2014   Dr. Greggory Keen    Allergies  Allergies  Allergen Reactions   Prednisone     "crazy"    Home Medications    Prior to Admission medications   Medication Sig Start Date End Date Taking? Authorizing Provider  aspirin EC 81 MG tablet Take 81 mg by mouth daily.    [provider]  cholecalciferol (VITAMIN D) 1000 UNITS tablet Take 2,000 Units by mouth daily.     [provider]  Cranberry 450 MG CAPS Take by mouth.    [provider]  diltiazem (CARDIZEM CD) 120 MG 24 hr capsule Take 1 capsule (120 mg total) by mouth daily. 12/28/22   Antonieta Iba, MD  diphenhydramine-acetaminophen (TYLENOL PM) 25-500 MG TABS tablet Take 1 tablet by mouth at bedtime as needed.    [provider]  glucosamine-chondroitin 500-400 MG tablet Take 1 tablet by mouth daily.     [provider]  isosorbide mononitrate (IMDUR) 30 MG 24 hr tablet TAKE 1 TABLET BY MOUTH EVERY DAY 11/12/22   Antonieta Iba, MD  levothyroxine (SYNTHROID) 50 MCG tablet TAKE 1 TABLET BY MOUTH  DAILY 06/27/23   Sherlene Shams, MD  Multiple Vitamins-Minerals (MULTIVITAMIN WITH MINERALS) tablet Take 1 tablet by mouth daily.    [provider]  Omega-3 Fatty Acids (FISH OIL) 1000 MG CPDR Take by mouth daily.    [provider]  pantoprazole (PROTONIX) 20 MG tablet TAKE 1 TABLET BY MOUTH EVERY DAY 06/09/23   Antonieta Iba, MD  rosuvastatin (CRESTOR) 10 MG tablet Take 1 tablet (10 mg total) by mouth daily. 06/27/23   Sherlene Shams, MD  traZODone (DESYREL) 50 MG tablet TAKE 1/2 TO 1 TABLET BY MOUTH AT BEDTIME AS NEEDED FOR SLEEP 02/07/23   Sherlene Shams, MD    Physical Exam    Vital Signs:  [name] does not have vital signs available for review today.  Given telephonic nature of communication, physical exam is limited. AAOx3. NAD. Normal affect.  Speech and respirations are unlabored.  Accessory  Clinical Findings    None  Assessment & Plan    1.  Preoperative Cardiovascular Risk Assessment:  According to the Revised Cardiac Risk Index (RCRI), her Perioperative Risk of Major Cardiac Event is (%): 0.4  Her Functional Capacity in METs is: 8.97 according to the [employer] Activity Status Index (DASI).   Per office protocol, if patient is without any new symptoms or concerns at the time of their virtual visit, he/she may hold ASA for 7 days prior to procedure. Please resume ASA as soon as possible postprocedure, at the discretion of the surgeon.    Therefore, based on ACC/AHA guidelines, patient would be at acceptable risk for the planned procedure without further cardiovascular testing. I will route this recommendation to the requesting party via Epic fax function.   Time:   Today, I have spent 10 minutes with the patient with telehealth technology discussing medical history, symptoms, and management plan.     Joni Reining, NP  07/01/2023, 2:08 PM

## 2023-07-04 ENCOUNTER — Encounter
Admission: RE | Admit: 2023-07-04 | Discharge: 2023-07-04 | Disposition: A | Discharge status: Home / Self Care | Payer: HMO | Source: Ambulatory Visit | Attending: Orthopedic Surgery | Admitting: Orthopedic Surgery

## 2023-07-04 ENCOUNTER — Other Ambulatory Visit: Payer: Self-pay

## 2023-07-04 VITALS — BP 134/67 | HR 53 | Resp 16 | Ht 67.0 in | Wt 134.0 lb

## 2023-07-04 DIAGNOSIS — Z01818 Encounter for other preprocedural examination: Secondary | ICD-10-CM | POA: Diagnosis present

## 2023-07-04 DIAGNOSIS — Z7982 Long term (current) use of aspirin: Secondary | ICD-10-CM | POA: Diagnosis not present

## 2023-07-04 DIAGNOSIS — Z01812 Encounter for preprocedural laboratory examination: Secondary | ICD-10-CM | POA: Diagnosis not present

## 2023-07-04 HISTORY — DX: Gastro-esophageal reflux disease without esophagitis: K21.9

## 2023-07-04 HISTORY — DX: Essential (primary) hypertension: I10

## 2023-07-04 HISTORY — DX: Atherosclerosis of aorta: I70.0

## 2023-07-04 HISTORY — DX: Syncope and collapse: R55

## 2023-07-04 HISTORY — DX: Prediabetes: R73.03

## 2023-07-04 HISTORY — DX: Vitamin D deficiency, unspecified: E55.9

## 2023-07-04 HISTORY — DX: Other chest pain: R07.89

## 2023-07-04 HISTORY — DX: Polyneuropathy, unspecified: G62.9

## 2023-07-04 HISTORY — DX: Raynaud's syndrome without gangrene: I73.00

## 2023-07-04 LAB — CBC WITH DIFFERENTIAL/PLATELET
Abs Immature Granulocytes: 0.01 10*3/uL (ref 0.00–0.07)
Basophils Absolute: 0 10*3/uL (ref 0.0–0.1)
Basophils Relative: 1 %
Eosinophils Absolute: 0.2 10*3/uL (ref 0.0–0.5)
Eosinophils Relative: 4 %
HCT: 38.2 % (ref 36.0–46.0)
Hemoglobin: 13.2 g/dL (ref 12.0–15.0)
Immature Granulocytes: 0 %
Lymphocytes Relative: 23 %
Lymphs Abs: 1.2 10*3/uL (ref 0.7–4.0)
MCH: 32.7 pg (ref 26.0–34.0)
MCHC: 34.6 g/dL (ref 30.0–36.0)
MCV: 94.6 fL (ref 80.0–100.0)
Monocytes Absolute: 0.5 10*3/uL (ref 0.1–1.0)
Monocytes Relative: 9 %
Neutro Abs: 3.3 10*3/uL (ref 1.7–7.7)
Neutrophils Relative %: 63 %
Platelets: 174 10*3/uL (ref 150–400)
RBC: 4.04 MIL/uL (ref 3.87–5.11)
RDW: 11.9 % (ref 11.5–15.5)
WBC: 5.2 10*3/uL (ref 4.0–10.5)
nRBC: 0 % (ref 0.0–0.2)

## 2023-07-04 LAB — URINALYSIS, ROUTINE W REFLEX MICROSCOPIC
Bilirubin Urine: NEGATIVE
Glucose, UA: NEGATIVE mg/dL
Hgb urine dipstick: NEGATIVE
Ketones, ur: NEGATIVE mg/dL
Leukocytes,Ua: NEGATIVE
Nitrite: NEGATIVE
Protein, ur: NEGATIVE mg/dL
Specific Gravity, Urine: 1.003 — ABNORMAL LOW (ref 1.005–1.030)
pH: 7 (ref 5.0–8.0)

## 2023-07-04 LAB — SURGICAL PCR SCREEN
MRSA, PCR: NEGATIVE
Staphylococcus aureus: POSITIVE — AB

## 2023-07-04 LAB — TYPE AND SCREEN
ABO/RH(D): A POS
Antibody Screen: NEGATIVE

## 2023-07-04 NOTE — Patient Instructions (Addendum)
Your procedure is scheduled on: Monday 07/11/23 To find out your arrival time, please call [phone] between 1PM - 3PM on:   Friday 07/08/23 Report to the Registration Desk on the 1st floor of the [facility]. Free Valet parking is available.  If your arrival time is 6:00 am, do not arrive before that time as the [facility] entrance doors do not open until 6:00 am.  REMEMBER: Instructions that are not followed completely may result in serious medical risk, up to and including death; or upon the discretion of your surgeon and anesthesiologist your surgery may need to be rescheduled.  Do not eat food after midnight the night before surgery.  No gum chewing or hard candies.  You may however, drink CLEAR liquids up to 2 hours before you are scheduled to arrive for your surgery. Do not drink anything within 2 hours of your scheduled arrival time.  Clear liquids include: - water  - apple juice without pulp - gatorade (not RED colors) - black coffee or tea (Do NOT add milk or creamers to the coffee or tea) Do NOT drink anything that is not on this list.  Type 1 and Type 2 diabetics should only drink water.  In addition, your doctor has ordered for you to drink the provided:  Ensure Pre-Surgery Clear Carbohydrate Drink  Drinking this carbohydrate drink up to two hours before surgery helps to reduce insulin resistance and improve patient outcomes. Please complete drinking 2 hours before scheduled arrival time.  One week prior to surgery: Stop Anti-inflammatories (NSAIDS) such as Advil, Aleve, Ibuprofen, Motrin, Naproxen, Naprosyn and Aspirin based products such as Excedrin, Goody's Powder, BC Powder. You may however, continue to take Tylenol if needed for pain up until the day of surgery.  Stop ANY OVER THE COUNTER supplements and vitamins for 7 days until after surgery. Multivitamin, osteo bi  flex, fish oil  Continue taking all prescribed medications.   TAKE ONLY THESE MEDICATIONS  THE MORNING OF SURGERY WITH A SIP OF WATER:  levothyroxine (SYNTHROID) 50 MCG tablet  pantoprazole (PROTONIX) 20 MG tablet Antacid (take one the night before and one on the morning of surgery - helps to prevent nausea after surgery.)  No Alcohol for 24 hours before or after surgery.  No Smoking including e-cigarettes for 24 hours before surgery.  No chewable tobacco products for at least 6 hours before surgery.  No nicotine patches on the day of surgery.  Do not use any "recreational" drugs for at least a week (preferably 2 weeks) before your surgery.  Please be advised that the combination of cocaine and anesthesia may have negative outcomes, up to and including death. If you test positive for cocaine, your surgery will be cancelled.  On the morning of surgery brush your teeth with toothpaste and water, you may rinse your mouth with mouthwash if you wish. Do not swallow any toothpaste or mouthwash.  Use CHG Soap or wipes as directed on instruction sheet. Shower daily with CHG soap starting on Thursday 07/07/23     Do not wear lotions, powders, or perfumes.   Do not shave body hair from the neck down 48 hours before surgery.  Wear comfortable clothing (specific to your surgery type) to the hospital.  Do not wear jewelry, make-up, hairpins, clips or nail polish.  Contact lenses, hearing aids and dentures may not be worn into surgery.  Do not bring valuables to the hospital. [facility] is not responsible for any missing/lost belongings or valuables.  Notify your doctor if there is any change in your medical condition (cold, fever, infection).  If you are being discharged the day of surgery, you will not be allowed to drive home. You will need a responsible individual to drive you home and stay with you for 24 hours after surgery.   If you are taking public transportation, you will need to have a responsible individual with you.  If you are being admitted to the hospital  overnight, leave your suitcase in the car. After surgery it may be brought to your room.  In case of increased patient census, it may be necessary for you, the patient, to continue your postoperative care in the Same Day Surgery department.  After surgery, you can help prevent lung complications by doing breathing exercises.  Take deep breaths and cough every 1-2 hours. Your doctor may order a device called an Incentive Spirometer to help you take deep breaths. When coughing or sneezing, hold a pillow firmly against your incision with both hands. This is called "splinting." Doing this helps protect your incision. It also decreases belly discomfort.  Surgery Visitation Policy:  Patients undergoing a surgery or procedure may have two family members or support persons with them as long as the person is not COVID-19 positive or experiencing its symptoms.   Inpatient Visitation:    Visiting hours are 7 a.m. to 8 p.m. Up to four visitors are allowed at one time in a patient room. The visitors may rotate out with other people during the day. One designated support person (adult) may remain overnight.  Please call the Pre-admissions Testing Dept. at [phone] if you have any questions about these instructions.    Pre-operative 5 CHG Bath Instructions   You can play a key role in reducing the risk of infection after surgery. Your skin needs to be as free of germs as possible. You can reduce the number of germs on your skin by washing with CHG (chlorhexidine gluconate) soap before surgery. CHG is an antiseptic soap that kills germs and continues to kill germs even after washing.   DO NOT use if you have an allergy to chlorhexidine/CHG or antibacterial soaps. If your skin becomes reddened or irritated, stop using the CHG and notify one of our RNs at [phone].   Please shower with the CHG soap starting 4 days before surgery using the following schedule:     Please keep in mind the  following:  DO NOT shave, including legs and underarms, starting the day of your first shower.   You may shave your face at any point before/day of surgery.  Place clean sheets on your bed the day you start using CHG soap. Use a clean washcloth (not used since being washed) for each shower. DO NOT sleep with pets once you start using the CHG.   CHG Shower Instructions:  If you choose to wash your hair and private area, wash first with your normal shampoo/soap.  After you use shampoo/soap, rinse your hair and body thoroughly to remove shampoo/soap residue.  Turn the water OFF and apply about 3 tablespoons (45 ml) of CHG soap to a CLEAN washcloth.  Apply CHG soap ONLY FROM YOUR NECK DOWN TO YOUR TOES (washing for 3-5 minutes)  DO NOT use CHG soap on face, private areas, open wounds, or sores.  Pay special attention to the area where your surgery is being performed.  If you are having back surgery, having someone wash your back for you may be  helpful. Wait 2 minutes after CHG soap is applied, then you may rinse off the CHG soap.  Pat dry with a clean towel  Put on clean clothes/pajamas   If you choose to wear lotion, please use ONLY the CHG-compatible lotions on the back of this paper.     Additional instructions for the day of surgery: DO NOT APPLY any lotions, deodorants, cologne, or perfumes.   Put on clean/comfortable clothes.  Brush your teeth.  Ask your nurse before applying any prescription medications to the skin.      CHG Compatible Lotions   Aveeno Moisturizing lotion  Cetaphil Moisturizing Cream  Cetaphil Moisturizing Lotion  Clairol Herbal Essence Moisturizing Lotion, Dry Skin  Clairol Herbal Essence Moisturizing Lotion, Extra Dry Skin  Clairol Herbal Essence Moisturizing Lotion, Normal Skin  Curel Age Defying Therapeutic Moisturizing Lotion with Alpha Hydroxy  Curel Extreme Care Body Lotion  Curel Soothing Hands Moisturizing Hand Lotion  Curel Therapeutic  Moisturizing Cream, Fragrance-Free  Curel Therapeutic Moisturizing Lotion, Fragrance-Free  Curel Therapeutic Moisturizing Lotion, Original Formula  Eucerin Daily Replenishing Lotion  Eucerin Dry Skin Therapy Plus Alpha Hydroxy Crme  Eucerin Dry Skin Therapy Plus Alpha Hydroxy Lotion  Eucerin Original Crme  Eucerin Original Lotion  Eucerin Plus Crme Eucerin Plus Lotion  Eucerin TriLipid Replenishing Lotion  Keri Anti-Bacterial Hand Lotion  Keri Deep Conditioning Original Lotion Dry Skin Formula Softly Scented  Keri Deep Conditioning Original Lotion, Fragrance Free Sensitive Skin Formula  Keri Lotion Fast Absorbing Fragrance Free Sensitive Skin Formula  Keri Lotion Fast Absorbing Softly Scented Dry Skin Formula  Keri Original Lotion  Keri Skin Renewal Lotion Keri Silky Smooth Lotion  Keri Silky Smooth Sensitive Skin Lotion  Nivea Body Creamy Conditioning Oil  Nivea Body Extra Enriched Lotion  Nivea Body Original Lotion  Nivea Body Sheer Moisturizing Lotion Nivea Crme  Nivea Skin Firming Lotion  NutraDerm 30 Skin Lotion  NutraDerm Skin Lotion  NutraDerm Therapeutic Skin Cream  NutraDerm Therapeutic Skin Lotion  ProShield Protective Hand Cream  Provon moisturizing lotion  How to Use an Incentive Spirometer  An incentive spirometer is a tool that measures how well you are filling your lungs with each breath. Learning to take long, deep breaths using this tool can help you keep your lungs clear and active. This may help to reverse or lessen your chance of developing breathing (pulmonary) problems, especially infection. You may be asked to use a spirometer: After a surgery. If you have a lung problem or a history of smoking. After a long period of time when you have been unable to move or be active. If the spirometer includes an indicator to show the highest number that you have reached, your health care provider or respiratory therapist will help you set a goal. Keep a log of your  progress as told by your health care provider. What are the risks? Breathing too quickly may cause dizziness or cause you to pass out. Take your time so you do not get dizzy or light-headed. If you are in pain, you may need to take pain medicine before doing incentive spirometry. It is harder to take a deep breath if you are having pain. How to use your incentive spirometer  Sit up on the edge of your bed or on a chair. Hold the incentive spirometer so that it is in an upright position. Before you use the spirometer, breathe out normally. Place the mouthpiece in your mouth. Make sure your lips are closed tightly around it. Breathe in  slowly and as deeply as you can through your mouth, causing the piston or the ball to rise toward the top of the chamber. Hold your breath for 3-5 seconds, or for as long as possible. If the spirometer includes a coach indicator, use this to guide you in breathing. Slow down your breathing if the indicator goes above the marked areas. Remove the mouthpiece from your mouth and breathe out normally. The piston or ball will return to the bottom of the chamber. Rest for a few seconds, then repeat the steps 10 or more times. Take your time and take a few normal breaths between deep breaths so that you do not get dizzy or light-headed. Do this every 1-2 hours when you are awake. If the spirometer includes a goal marker to show the highest number you have reached (best effort), use this as a goal to work toward during each repetition. After each set of 10 deep breaths, cough a few times. This will help to make sure that your lungs are clear. If you have an incision on your chest or abdomen from surgery, place a pillow or a rolled-up towel firmly against the incision when you cough. This can help to reduce pain while taking deep breaths and coughing. General tips When you are able to get out of bed: Walk around often. Continue to take deep breaths and cough in order to  clear your lungs. Keep using the incentive spirometer until your health care provider says it is okay to stop using it. If you have been in the hospital, you may be told to keep using the spirometer at home. Contact a health care provider if: You are having difficulty using the spirometer. You have trouble using the spirometer as often as instructed. Your pain medicine is not giving enough relief for you to use the spirometer as told. You have a fever. Get help right away if: You develop shortness of breath. You develop a cough with bloody mucus from the lungs. You have fluid or blood coming from an incision site after you cough. Summary An incentive spirometer is a tool that can help you learn to take long, deep breaths to keep your lungs clear and active. You may be asked to use a spirometer after a surgery, if you have a lung problem or a history of smoking, or if you have been inactive for a long period of time. Use your incentive spirometer as instructed every 1-2 hours while you are awake. If you have an incision on your chest or abdomen, place a pillow or a rolled-up towel firmly against your incision when you cough. This will help to reduce pain. Get help right away if you have shortness of breath, you cough up bloody mucus, or blood comes from your incision when you cough. This information is not intended to replace advice given to you by your health care provider. Make sure you discuss any questions you have with your health care provider. Document Revised: 01/28/2020 Document Reviewed: 01/28/2020 Elsevier Patient Education  2023 Elsevier Inc.    Preoperative Educational Videos for Total Hip, Knee and Shoulder Replacements  To better prepare for surgery, please view our videos that explain the physical activity and discharge planning required to have the best surgical recovery at [facility].  [email or web address]  Questions? Call  [phone] or email [email or web address]

## 2023-07-04 NOTE — Telephone Encounter (Signed)
I will fax clearance notes to requesting office. See notes below from Joni Reining, DNP.    Assessment & Plan    1.  Preoperative Cardiovascular Risk Assessment:   According to the Revised Cardiac Risk Index (RCRI), her Perioperative Risk of Major Cardiac Event is (%): 0.4   Her Functional Capacity in METs is: 8.97 according to the Duke Activity Status Index (DASI).    Per office protocol, if patient is without any new symptoms or concerns at the time of their virtual visit, he/she may hold ASA for 7 days prior to procedure. Please resume ASA as soon as possible postprocedure, at the discretion of the surgeon.     Therefore, based on ACC/AHA guidelines, patient would be at acceptable risk for the planned procedure without further cardiovascular testing. I will route this recommendation to the requesting party via Epic fax function.    Time:   Today, I have spent 10 minutes with the patient with telehealth technology discussing medical history, symptoms, and management plan.       Joni Reining, NP   07/01/2023, 2:08 PM

## 2023-07-04 NOTE — Telephone Encounter (Signed)
  [name] calling to follow up pt's clearance

## 2023-07-05 ENCOUNTER — Telehealth: Payer: Self-pay | Admitting: Cardiovascular Disease

## 2023-07-05 NOTE — Telephone Encounter (Signed)
   Name: [name]  DOB: [birth year]  MRN: [id number]  Primary Cardiologist: Julien Nordmann, MD  Chart reviewed as part of pre-operative protocol coverage. Because of Steve Scheible Ibarra's past medical history and time since last visit, she will require a follow-up telephone visit in order to better assess preoperative cardiovascular risk.  Pre-op covering staff: - Please schedule appointment and call patient to inform them. If patient already had an upcoming appointment within acceptable timeframe, please add "pre-op clearance" to the appointment notes so provider is aware. - Please contact requesting surgeon's office via preferred method (i.e, phone, fax) to inform them of need for appointment prior to surgery.  No medications indicated as needing held. She is on ASA and if bleeding risk is too high can hold ASA x 5-7 days.   Sharlene Dory, PA-C  07/05/2023, 2:17 PM

## 2023-07-05 NOTE — Telephone Encounter (Signed)
   Pre-operative Risk Assessment    Patient Name: [name]  DOB: 06-Sep-1941 MRN: [id number]      Request for Surgical Clearance    Procedure:   Right TKA  Date of Surgery:  Clearance 07/11/23                                 Surgeon:  Dr. Audelia Acton Surgeon's Group or Practice Name:  [facility] Phone number:  [phone] Fax number:  [phone]   Type of Clearance Requested:   - Medical    Type of Anesthesia:  Not Indicated   Additional requests/questions:    Signed, Shawna Orleans   07/05/2023, 1:31 PM

## 2023-07-05 NOTE — Telephone Encounter (Signed)
I s/w the pt about tele appt when she tells me that she had a tele appt 07/01/23. I reviewed the chart and apologized for the call today. Pt actually cleared by Joni Reining, DNP 07/01/23 and notes were faxed to requesting office. In review of the chart I see the original clearance request came over 06/14/23. Pt was cleared 07/01/23. Pt said the surgeon's office said they have not received the notes. I assured the pt that we have faxed the notes with confirmation notes went through. I assured the pt that I will re-fax notes again today.

## 2023-07-06 ENCOUNTER — Encounter: Payer: Self-pay | Admitting: Orthopedic Surgery

## 2023-07-06 DIAGNOSIS — M1711 Unilateral primary osteoarthritis, right knee: Secondary | ICD-10-CM | POA: Diagnosis not present

## 2023-07-08 NOTE — Anesthesia Preprocedure Evaluation (Signed)
Anesthesia Evaluation  Patient identified by MRN, date of birth, ID band Patient awake    Reviewed: Allergy & Precautions, H&P , NPO status , Patient's Chart, lab work & pertinent test results  Airway Mallampati: II  TM Distance: >3 FB Neck ROM: full    Dental  (+) Chipped   Pulmonary neg pulmonary ROS, former smoker   Pulmonary exam normal        Cardiovascular hypertension, Normal cardiovascular exam+ dysrhythmias (sinus bradycardia 7/24)   Raynaud's disease  EKG 7/24: Sinus Bradycardia   Neuro/Psych  Neuromuscular disease (peripheral neuropathy)  negative psych ROS   GI/Hepatic Neg liver ROS,GERD  ,,  Endo/Other  Hypothyroidism    Renal/GU      Musculoskeletal  (+) Arthritis  (Primary osteoarthritis of both knee), Osteoarthritis,    Abdominal   Peds  Hematology negative hematology ROS (+)   Anesthesia Other Findings Past Medical History: No date: Abdominal aortic atherosclerosis (HCC) No date: Arthritis of both knees No date: Atypical chest pain 10/23/2020: Basal cell carcinoma     Comment:  right lateral ankle EDC done 12/18/20 05/13/2021: Basal cell carcinoma     Comment:  right pretibia, [facility] 07/22/21 12/03/2021: Basal cell carcinoma     Comment:  left chest, [facility] 01/06/2022 05/13/2021: Basal cell carcinoma (BCC)     Comment:  left forehead, Moh's 07/29/2021 06/30/2021: Candida esophagitis (HCC) 09/10/2015: E. coli UTI     Comment:  Resistant to cipro .  Keflex sent x 5 days  No date: Elevated blood pressure reading in office with diagnosis of  hypertension No date: GERD (gastroesophageal reflux disease) 05/13/2021: History of dysplastic nevus     Comment:  right dorsal hand, mild No date: Hyperlipidemia No date: Hypothyroidism No date: Insomnia     Comment:  a.) uses trazodone PRN 11/18/2022: Laceration of head without foreign body No date: Long term current use of aspirin No date: Peripheral  neuropathy No date: Pre-diabetes No date: PVC's (premature ventricular contractions)     Comment:  a.  48-hour Holter in 3/17 demonstrated NSR, frequent               PVCs totaling 5300 beats representing 3% burden,               occasional PACs, short runs of SVT No date: Raynaud's disease No date: Syncope and collapse     Comment:  r/t bp meds No date: Vitamin D deficiency  Past Surgical History: No date: ABDOMINAL HYSTERECTOMY     Comment:  In 30's 07/12/2017: BROW LIFT; Bilateral     Comment:  Procedure: BLEPHAROPLASTY upper eyelid with excess skin;              Surgeon: Imagene Riches, MD;  Location: [facility]               [facility];  Service: Ophthalmology;  Laterality: Bilateral;                MAC No date: CATARACT EXTRACTION, BILATERAL 12/29/2015: COLONOSCOPY WITH PROPOFOL; N/A     Comment:  Procedure: COLONOSCOPY WITH PROPOFOL;  Surgeon: Wallace Cullens, MD;  Location: [facility] ENDOSCOPY;  Service:               Gastroenterology;  Laterality: N/A; 12/03/2019: COLONOSCOPY WITH PROPOFOL; N/A     Comment:  Procedure: COLONOSCOPY WITH BIOPSY;  Surgeon: Servando Snare,  Darren, MD;  Location: [facility];  Service:               Endoscopy;  Laterality: N/A; 06/25/2021: ESOPHAGOGASTRODUODENOSCOPY (EGD) WITH PROPOFOL; N/A     Comment:  Procedure: ESOPHAGOGASTRODUODENOSCOPY (EGD) WITH               PROPOFOL;  Surgeon: Midge Minium, MD;  Location: [facility]               SURGERY CN[facility];  Service: Endoscopy;  Laterality: N/A; 12/03/2019: POLYPECTOMY; N/A     Comment:  Procedure: POLYPECTOMY;  Surgeon: Midge Minium, MD;                Location: [facility];  Service: Endoscopy;                Laterality: N/A; 07/12/2017: PTOSIS REPAIR; Bilateral     Comment:  Procedure: ptosis repair resect ex;  Surgeon: Imagene Riches, MD;  Location: [facility];  Service:               Ophthalmology;  Laterality: Bilateral;  Requests early 2014: VAGINAL  PROLAPSE REPAIR     Comment:  Dr. Greggory Keen     Reproductive/Obstetrics negative OB ROS                              Anesthesia Physical Anesthesia Plan  ASA: 2  Anesthesia Plan: Spinal   Post-op Pain Management:    Induction: Intravenous  PONV Risk Score and Plan: Propofol infusion  Airway Management Planned:   Additional Equipment:   Intra-op Plan:   Post-operative Plan:   Informed Consent:   Plan Discussed with:   Anesthesia Plan Comments:          Anesthesia Quick Evaluation

## 2023-07-11 ENCOUNTER — Other Ambulatory Visit: Payer: Self-pay

## 2023-07-11 ENCOUNTER — Encounter: Payer: Self-pay | Admitting: Orthopedic Surgery

## 2023-07-11 ENCOUNTER — Ambulatory Visit: Payer: HMO | Admitting: Urgent Care

## 2023-07-11 ENCOUNTER — Encounter
Admission: RE | Disposition: A | Discharge status: Home Health | Payer: Self-pay | Source: Home / Self Care | Attending: Orthopedic Surgery

## 2023-07-11 ENCOUNTER — Ambulatory Visit: Payer: HMO

## 2023-07-11 ENCOUNTER — Observation Stay
Admission: RE | Admit: 2023-07-11 | Discharge: 2023-07-12 | Disposition: A | Discharge status: Home Health | Payer: HMO | Attending: Orthopedic Surgery | Admitting: Orthopedic Surgery

## 2023-07-11 DIAGNOSIS — Z471 Aftercare following joint replacement surgery: Secondary | ICD-10-CM | POA: Diagnosis not present

## 2023-07-11 DIAGNOSIS — Z85828 Personal history of other malignant neoplasm of skin: Secondary | ICD-10-CM | POA: Diagnosis not present

## 2023-07-11 DIAGNOSIS — Z7982 Long term (current) use of aspirin: Secondary | ICD-10-CM | POA: Insufficient documentation

## 2023-07-11 DIAGNOSIS — M1711 Unilateral primary osteoarthritis, right knee: Principal | ICD-10-CM | POA: Insufficient documentation

## 2023-07-11 DIAGNOSIS — M170 Bilateral primary osteoarthritis of knee: Secondary | ICD-10-CM | POA: Diagnosis not present

## 2023-07-11 DIAGNOSIS — Z96651 Presence of right artificial knee joint: Principal | ICD-10-CM

## 2023-07-11 DIAGNOSIS — Z79899 Other long term (current) drug therapy: Secondary | ICD-10-CM | POA: Diagnosis not present

## 2023-07-11 DIAGNOSIS — E039 Hypothyroidism, unspecified: Secondary | ICD-10-CM | POA: Insufficient documentation

## 2023-07-11 DIAGNOSIS — I4891 Unspecified atrial fibrillation: Secondary | ICD-10-CM | POA: Diagnosis not present

## 2023-07-11 DIAGNOSIS — R609 Edema, unspecified: Secondary | ICD-10-CM | POA: Diagnosis not present

## 2023-07-11 HISTORY — DX: Long term (current) use of aspirin: Z79.82

## 2023-07-11 HISTORY — DX: Insomnia, unspecified: G47.00

## 2023-07-11 HISTORY — DX: Other complications of anesthesia, initial encounter: T88.59XA

## 2023-07-11 HISTORY — PX: TOTAL KNEE ARTHROPLASTY: SHX125

## 2023-07-11 HISTORY — DX: Bilateral primary osteoarthritis of knee: M17.0

## 2023-07-11 LAB — ABO/RH: ABO/RH(D): A POS

## 2023-07-11 SURGERY — ARTHROPLASTY, KNEE, TOTAL
Anesthesia: Spinal | Site: Knee | Laterality: Right

## 2023-07-11 MED ORDER — TRANEXAMIC ACID-NACL 1000-0.7 MG/100ML-% IV SOLN
INTRAVENOUS | Status: AC
  Filled 2023-07-11: qty 100

## 2023-07-11 MED ORDER — SODIUM CHLORIDE 0.9 % IR SOLN
Status: DC | PRN
  Administered 2023-07-11: 1500 mL

## 2023-07-11 MED ORDER — ISOSORBIDE MONONITRATE ER 30 MG PO TB24
30.0000 mg | ORAL_TABLET | Freq: Every day | ORAL | Status: DC
  Administered 2023-07-11 – 2023-07-12 (×2): 30 mg via ORAL
  Filled 2023-07-11 (×2): qty 1

## 2023-07-11 MED ORDER — BUPIVACAINE HCL (PF) 0.5 % IJ SOLN
INTRAMUSCULAR | Status: DC | PRN
  Administered 2023-07-11: 2.5 mL

## 2023-07-11 MED ORDER — PANTOPRAZOLE SODIUM 40 MG PO TBEC
40.0000 mg | DELAYED_RELEASE_TABLET | Freq: Every day | ORAL | Status: DC
  Administered 2023-07-11 – 2023-07-12 (×2): 40 mg via ORAL
  Filled 2023-07-11 (×2): qty 1

## 2023-07-11 MED ORDER — METOCLOPRAMIDE HCL 5 MG/ML IJ SOLN: 5.0000 mg | Freq: Three times a day (TID) | INTRAMUSCULAR | Status: DC | PRN

## 2023-07-11 MED ORDER — ACETAMINOPHEN 500 MG PO TABS
1000.0000 mg | ORAL_TABLET | Freq: Three times a day (TID) | ORAL | Status: DC
  Administered 2023-07-11 – 2023-07-12 (×2): 1000 mg via ORAL
  Filled 2023-07-11 (×2): qty 2

## 2023-07-11 MED ORDER — BUPIVACAINE HCL (PF) 0.5 % IJ SOLN
INTRAMUSCULAR | Status: AC
  Filled 2023-07-11: qty 10

## 2023-07-11 MED ORDER — PROPOFOL 500 MG/50ML IV EMUL
INTRAVENOUS | Status: DC | PRN
  Administered 2023-07-11: 50 ug/kg/min via INTRAVENOUS

## 2023-07-11 MED ORDER — MIDAZOLAM HCL 2 MG/2ML IJ SOLN
INTRAMUSCULAR | Status: AC
  Filled 2023-07-11: qty 2

## 2023-07-11 MED ORDER — KETOROLAC TROMETHAMINE 0.5 % OP SOLN
1.0000 [drp] | Freq: Four times a day (QID) | OPHTHALMIC | Status: DC
  Administered 2023-07-11: 1 [drp] via OPHTHALMIC
  Filled 2023-07-11: qty 3

## 2023-07-11 MED ORDER — FENTANYL CITRATE (PF) 100 MCG/2ML IJ SOLN
INTRAMUSCULAR | Status: DC | PRN
  Administered 2023-07-11 (×2): 12.5 ug via INTRAVENOUS

## 2023-07-11 MED ORDER — CEFAZOLIN SODIUM-DEXTROSE 2-4 GM/100ML-% IV SOLN
2.0000 g | INTRAVENOUS | Status: AC
  Administered 2023-07-11: 2 g via INTRAVENOUS

## 2023-07-11 MED ORDER — KETOROLAC TROMETHAMINE 30 MG/ML IJ SOLN
INTRAMUSCULAR | Status: DC | PRN
  Administered 2023-07-11: 30 mg via INTRAMUSCULAR

## 2023-07-11 MED ORDER — LEVOTHYROXINE SODIUM 50 MCG PO TABS
50.0000 ug | ORAL_TABLET | Freq: Every day | ORAL | Status: DC
  Administered 2023-07-12: 50 ug via ORAL
  Filled 2023-07-11: qty 1

## 2023-07-11 MED ORDER — DOCUSATE SODIUM 100 MG PO CAPS
100.0000 mg | ORAL_CAPSULE | Freq: Two times a day (BID) | ORAL | Status: DC
  Administered 2023-07-11 – 2023-07-12 (×2): 100 mg via ORAL
  Filled 2023-07-11 (×2): qty 1

## 2023-07-11 MED ORDER — KETOROLAC TROMETHAMINE 15 MG/ML IJ SOLN
7.5000 mg | Freq: Four times a day (QID) | INTRAMUSCULAR | Status: AC
  Administered 2023-07-11 – 2023-07-12 (×4): 7.5 mg via INTRAVENOUS
  Filled 2023-07-11 (×4): qty 1

## 2023-07-11 MED ORDER — ONDANSETRON HCL 4 MG/2ML IJ SOLN
INTRAMUSCULAR | Status: DC | PRN
Start: 2023-07-11 — End: 2023-07-11
  Administered 2023-07-11: 4 mg via INTRAVENOUS

## 2023-07-11 MED ORDER — MIDAZOLAM HCL 5 MG/5ML IJ SOLN
INTRAMUSCULAR | Status: DC | PRN
  Administered 2023-07-11 (×2): 1 mg via INTRAVENOUS

## 2023-07-11 MED ORDER — DEXAMETHASONE SODIUM PHOSPHATE 10 MG/ML IJ SOLN
INTRAMUSCULAR | Status: AC
  Filled 2023-07-11: qty 1

## 2023-07-11 MED ORDER — ACETAMINOPHEN 10 MG/ML IV SOLN
INTRAVENOUS | Status: DC | PRN
  Administered 2023-07-11: 1000 mg via INTRAVENOUS

## 2023-07-11 MED ORDER — ORAL CARE MOUTH RINSE: 15.0000 mL | Freq: Once | OROMUCOSAL | Status: AC

## 2023-07-11 MED ORDER — HYDROCODONE-ACETAMINOPHEN 5-325 MG PO TABS
1.0000 | ORAL_TABLET | ORAL | Status: DC | PRN
  Filled 2023-07-11: qty 1

## 2023-07-11 MED ORDER — ASPIRIN 81 MG PO TBEC
81.0000 mg | DELAYED_RELEASE_TABLET | Freq: Every day | ORAL | Status: DC
  Administered 2023-07-11 – 2023-07-12 (×2): 81 mg via ORAL
  Filled 2023-07-11 (×2): qty 1

## 2023-07-11 MED ORDER — DEXAMETHASONE SODIUM PHOSPHATE 10 MG/ML IJ SOLN
8.0000 mg | Freq: Once | INTRAMUSCULAR | Status: AC
  Administered 2023-07-11: 8 mg via INTRAVENOUS

## 2023-07-11 MED ORDER — SURGIRINSE WOUND IRRIGATION SYSTEM - OPTIME
TOPICAL | Status: DC | PRN
  Administered 2023-07-11: 900 mL

## 2023-07-11 MED ORDER — ACETAMINOPHEN 10 MG/ML IV SOLN
INTRAVENOUS | Status: AC
  Filled 2023-07-11: qty 100

## 2023-07-11 MED ORDER — GLYCOPYRROLATE 0.2 MG/ML IJ SOLN
INTRAMUSCULAR | Status: AC
  Filled 2023-07-11: qty 1

## 2023-07-11 MED ORDER — TRAMADOL HCL 50 MG PO TABS: 50.0000 mg | ORAL_TABLET | Freq: Four times a day (QID) | ORAL | Status: DC | PRN

## 2023-07-11 MED ORDER — MORPHINE SULFATE (PF) 2 MG/ML IV SOLN: 0.5000 mg | INTRAVENOUS | Status: DC | PRN

## 2023-07-11 MED ORDER — ROSUVASTATIN CALCIUM 10 MG PO TABS
10.0000 mg | ORAL_TABLET | Freq: Every day | ORAL | Status: DC
  Administered 2023-07-11 – 2023-07-12 (×2): 10 mg via ORAL
  Filled 2023-07-11 (×2): qty 1

## 2023-07-11 MED ORDER — CHLORHEXIDINE GLUCONATE 0.12 % MT SOLN
OROMUCOSAL | Status: AC
  Filled 2023-07-11: qty 15

## 2023-07-11 MED ORDER — MENTHOL 3 MG MT LOZG: 1.0000 | LOZENGE | OROMUCOSAL | Status: DC | PRN

## 2023-07-11 MED ORDER — METOCLOPRAMIDE HCL 5 MG PO TABS: 5.0000 mg | ORAL_TABLET | Freq: Three times a day (TID) | ORAL | Status: DC | PRN

## 2023-07-11 MED ORDER — KETOROLAC TROMETHAMINE 30 MG/ML IJ SOLN
INTRAMUSCULAR | Status: AC
  Filled 2023-07-11: qty 1

## 2023-07-11 MED ORDER — BUPIVACAINE LIPOSOME 1.3 % IJ SUSP
INTRAMUSCULAR | Status: AC
  Filled 2023-07-11: qty 20

## 2023-07-11 MED ORDER — OXYCODONE HCL 5 MG PO TABS: 5.0000 mg | ORAL_TABLET | Freq: Once | ORAL | Status: DC | PRN

## 2023-07-11 MED ORDER — ACETAMINOPHEN 10 MG/ML IV SOLN: 1000.0000 mg | Freq: Once | INTRAVENOUS | Status: DC | PRN

## 2023-07-11 MED ORDER — ONDANSETRON HCL 4 MG/2ML IJ SOLN
4.0000 mg | Freq: Four times a day (QID) | INTRAMUSCULAR | Status: DC | PRN
  Administered 2023-07-11: 4 mg via INTRAVENOUS
  Filled 2023-07-11: qty 2

## 2023-07-11 MED ORDER — BUPIVACAINE-EPINEPHRINE (PF) 0.25% -1:200000 IJ SOLN
INTRAMUSCULAR | Status: AC
  Filled 2023-07-11: qty 30

## 2023-07-11 MED ORDER — PHENOL 1.4 % MT LIQD: 1.0000 | OROMUCOSAL | Status: DC | PRN

## 2023-07-11 MED ORDER — DROPERIDOL 2.5 MG/ML IJ SOLN: 0.6250 mg | Freq: Once | INTRAMUSCULAR | Status: DC | PRN

## 2023-07-11 MED ORDER — BUPIVACAINE LIPOSOME 1.3 % IJ SUSP
INTRAMUSCULAR | Status: DC | PRN
  Administered 2023-07-11: 20 mL

## 2023-07-11 MED ORDER — FENTANYL CITRATE (PF) 100 MCG/2ML IJ SOLN: 25.0000 ug | INTRAMUSCULAR | Status: DC | PRN

## 2023-07-11 MED ORDER — BSS IO SOLN
15.0000 mL | Freq: Once | INTRAOCULAR | Status: AC
  Administered 2023-07-11: 15 mL
  Filled 2023-07-11: qty 15

## 2023-07-11 MED ORDER — CHLORHEXIDINE GLUCONATE 0.12 % MT SOLN
15.0000 mL | Freq: Once | OROMUCOSAL | Status: AC
  Administered 2023-07-11: 15 mL via OROMUCOSAL

## 2023-07-11 MED ORDER — SODIUM CHLORIDE FLUSH 0.9 % IV SOLN
INTRAVENOUS | Status: AC
  Filled 2023-07-11: qty 20

## 2023-07-11 MED ORDER — TRAZODONE HCL 50 MG PO TABS
25.0000 mg | ORAL_TABLET | Freq: Every evening | ORAL | Status: DC | PRN
  Administered 2023-07-12: 50 mg via ORAL
  Filled 2023-07-11: qty 1

## 2023-07-11 MED ORDER — FENTANYL CITRATE (PF) 100 MCG/2ML IJ SOLN
INTRAMUSCULAR | Status: AC
  Filled 2023-07-11: qty 2

## 2023-07-11 MED ORDER — CEFAZOLIN SODIUM-DEXTROSE 2-4 GM/100ML-% IV SOLN
2.0000 g | Freq: Four times a day (QID) | INTRAVENOUS | Status: AC
  Administered 2023-07-11 – 2023-07-12 (×2): 2 g via INTRAVENOUS
  Filled 2023-07-11 (×3): qty 100

## 2023-07-11 MED ORDER — ONDANSETRON HCL 4 MG PO TABS: 4.0000 mg | ORAL_TABLET | Freq: Four times a day (QID) | ORAL | Status: DC | PRN

## 2023-07-11 MED ORDER — SODIUM CHLORIDE 0.9 % IV SOLN: INTRAVENOUS | Status: DC

## 2023-07-11 MED ORDER — SODIUM CHLORIDE (PF) 0.9 % IJ SOLN
INTRAMUSCULAR | Status: DC | PRN
  Administered 2023-07-11: 20 mL

## 2023-07-11 MED ORDER — OXYCODONE HCL 5 MG/5ML PO SOLN: 5.0000 mg | Freq: Once | ORAL | Status: DC | PRN

## 2023-07-11 MED ORDER — PROPOFOL 1000 MG/100ML IV EMUL
INTRAVENOUS | Status: AC
  Filled 2023-07-11: qty 100

## 2023-07-11 MED ORDER — TRANEXAMIC ACID-NACL 1000-0.7 MG/100ML-% IV SOLN
1000.0000 mg | INTRAVENOUS | Status: AC
  Administered 2023-07-11 (×2): 1000 mg via INTRAVENOUS

## 2023-07-11 MED ORDER — PROMETHAZINE HCL 25 MG/ML IJ SOLN: 6.2500 mg | INTRAMUSCULAR | Status: DC | PRN

## 2023-07-11 MED ORDER — CEFAZOLIN SODIUM-DEXTROSE 2-4 GM/100ML-% IV SOLN
INTRAVENOUS | Status: AC
  Filled 2023-07-11: qty 100

## 2023-07-11 MED ORDER — LACTATED RINGERS IV SOLN: INTRAVENOUS | Status: DC

## 2023-07-11 MED ORDER — DILTIAZEM HCL ER COATED BEADS 120 MG PO CP24
120.0000 mg | ORAL_CAPSULE | Freq: Every day | ORAL | Status: DC
  Administered 2023-07-11 – 2023-07-12 (×2): 120 mg via ORAL
  Filled 2023-07-11 (×2): qty 1

## 2023-07-11 MED ORDER — ENOXAPARIN SODIUM 30 MG/0.3ML IJ SOSY
30.0000 mg | PREFILLED_SYRINGE | Freq: Two times a day (BID) | INTRAMUSCULAR | Status: DC
  Administered 2023-07-12: 30 mg via SUBCUTANEOUS
  Filled 2023-07-11: qty 0.3

## 2023-07-11 MED ORDER — BUPIVACAINE-EPINEPHRINE (PF) 0.25% -1:200000 IJ SOLN
INTRAMUSCULAR | Status: DC | PRN
  Administered 2023-07-11: 30 mL

## 2023-07-11 SURGICAL SUPPLY — 79 items
ADH SKN CLS APL DERMABOND .7 (GAUZE/BANDAGES/DRESSINGS) ×1
APL PRP STRL LF DISP 70% ISPRP (MISCELLANEOUS) ×2
BLADE PATELLA REAM PILOT HOLE (MISCELLANEOUS) IMPLANT
BLADE SAGITTAL AGGR TOOTH XLG (BLADE) IMPLANT
BLADE SAW SAG 25X90X1.19 (BLADE) ×1 IMPLANT
BLADE SAW SAG 29X58X.64 (BLADE) ×1 IMPLANT
BOWL CEMENT MIX W/ADAPTER (MISCELLANEOUS) ×1 IMPLANT
BSPLAT TIB 5D E CMNT KN RT (Knees) ×1 IMPLANT
CEMENT BONE R 1X40 (Cement) ×2 IMPLANT
CHLORAPREP W/TINT 26 (MISCELLANEOUS) ×2 IMPLANT
COMP FEM KNEE NRW SZ9 RT (Joint) ×1 IMPLANT
COMPONENT FEM KNEE NRW SZ9 RT (Joint) IMPLANT
COOLER POLAR GLACIER W/PUMP (MISCELLANEOUS) ×1 IMPLANT
CUFF TOURN SGL QUICK 24 (TOURNIQUET CUFF) ×1
CUFF TOURN SGL QUICK 30 (TOURNIQUET CUFF)
CUFF TRNQT CYL 24X4X16.5-23 (TOURNIQUET CUFF) IMPLANT
CUFF TRNQT CYL 30X4X21-28X (TOURNIQUET CUFF) IMPLANT
DERMABOND ADVANCED .7 DNX12 (GAUZE/BANDAGES/DRESSINGS) ×1 IMPLANT
DRAPE INCISE IOBAN 66X60 STRL (DRAPES) IMPLANT
DRAPE SHEET LG 3/4 BI-LAMINATE (DRAPES) ×1 IMPLANT
DRSG MEPILEX SACRM 8.7X9.8 (GAUZE/BANDAGES/DRESSINGS) ×1 IMPLANT
DRSG OPSITE POSTOP 4X10 (GAUZE/BANDAGES/DRESSINGS) IMPLANT
DRSG OPSITE POSTOP 4X8 (GAUZE/BANDAGES/DRESSINGS) IMPLANT
ELECT REM PT RETURN 9FT ADLT (ELECTROSURGICAL) ×1
ELECTRODE REM PT RTRN 9FT ADLT (ELECTROSURGICAL) ×1 IMPLANT
GLOVE BIO SURGEON STRL SZ8 (GLOVE) ×1 IMPLANT
GLOVE BIOGEL PI IND STRL 8 (GLOVE) ×1 IMPLANT
GLOVE PI ORTHO PRO STRL 7.5 (GLOVE) ×2 IMPLANT
GLOVE PI ORTHO PRO STRL SZ8 (GLOVE) ×2 IMPLANT
GLOVE SURG SYN 7.5 E (GLOVE) ×1 IMPLANT
GLOVE SURG SYN 7.5 PF PI (GLOVE) ×1 IMPLANT
GOWN SRG XL LVL 3 NONREINFORCE (GOWNS) ×1 IMPLANT
GOWN STRL NON-REIN TWL XL LVL3 (GOWNS) ×1
GOWN STRL REUS W/ TWL LRG LVL3 (GOWN DISPOSABLE) ×1 IMPLANT
GOWN STRL REUS W/ TWL XL LVL3 (GOWN DISPOSABLE) ×1 IMPLANT
GOWN STRL REUS W/TWL LRG LVL3 (GOWN DISPOSABLE) ×1
GOWN STRL REUS W/TWL XL LVL3 (GOWN DISPOSABLE) ×1
HANDLE YANKAUER SUCT OPEN TIP (MISCELLANEOUS) ×1 IMPLANT
HOOD PEEL AWAY T7 (MISCELLANEOUS) ×2 IMPLANT
INSERT TIBIA KNEE RIGHT 10 (Joint) IMPLANT
IV NS IRRIG 3000ML ARTHROMATIC (IV SOLUTION) ×1 IMPLANT
KIT TURNOVER KIT A (KITS) ×1 IMPLANT
MANIFOLD NEPTUNE II (INSTRUMENTS) ×1 IMPLANT
MARKER SKIN DUAL TIP RULER LAB (MISCELLANEOUS) ×1 IMPLANT
MAT ABSORB FLUID 56X50 GRAY (MISCELLANEOUS) ×1 IMPLANT
NDL FILTER BLUNT 18X1 1/2 (NEEDLE) ×1 IMPLANT
NDL HYPO 21X1.5 SAFETY (NEEDLE) ×1 IMPLANT
NDL SAFETY ECLIP 18X1.5 (MISCELLANEOUS) ×1 IMPLANT
NEEDLE FILTER BLUNT 18X1 1/2 (NEEDLE) ×1 IMPLANT
NEEDLE HYPO 21X1.5 SAFETY (NEEDLE) ×1 IMPLANT
PACK TOTAL KNEE (MISCELLANEOUS) ×1 IMPLANT
PAD ARMBOARD 7.5X6 YLW CONV (MISCELLANEOUS) ×3 IMPLANT
PAD WRAPON POLAR KNEE (MISCELLANEOUS) ×1 IMPLANT
PENCIL SMOKE EVACUATOR (MISCELLANEOUS) ×1 IMPLANT
PIN DRILL HDLS TROCAR 75 4PK (PIN) IMPLANT
PULSAVAC PLUS IRRIG FAN TIP (DISPOSABLE) ×1
Patella Reaming System IMPLANT
SCREW FEMALE HEX FIX 25X2.5 (ORTHOPEDIC DISPOSABLE SUPPLIES) IMPLANT
SCREW HEX HEADED 3.5X27 DISP (ORTHOPEDIC DISPOSABLE SUPPLIES) IMPLANT
SLEEVE SCD COMPRESS KNEE MED (STOCKING) ×1 IMPLANT
SOLUTION IRRIG SURGIPHOR (IV SOLUTION) ×1 IMPLANT
STEM POLY PAT PLY 35M KNEE (Knees) IMPLANT
STEM TIB ST PERS 14+30 (Stem) IMPLANT
STEM TIBIA 5 DEG SZ E R KNEE (Knees) IMPLANT
SUT DVC 2 QUILL PDO T11 36X36 (SUTURE) ×1 IMPLANT
SUT QUILL MONODERM 3-0 PS-2 (SUTURE) ×1 IMPLANT
SUT VIC AB 0 CT1 36 (SUTURE) ×1 IMPLANT
SUT VIC AB 2-0 CT2 27 (SUTURE) ×2 IMPLANT
SUT VICRYL 1-0 27IN ABS (SUTURE) ×1
SUTURE VICRYL 1-0 27IN ABS (SUTURE) ×1 IMPLANT
SYR 30ML LL (SYRINGE) ×2 IMPLANT
SYR TB 1ML LL NO SAFETY (SYRINGE) ×1 IMPLANT
TAPE CLOTH 3X10 WHT NS LF (GAUZE/BANDAGES/DRESSINGS) ×1 IMPLANT
TIBIA STEM 5 DEG SZ E R KNEE (Knees) ×1 IMPLANT
TIP FAN IRRIG PULSAVAC PLUS (DISPOSABLE) ×1 IMPLANT
TOWEL OR 17X26 4PK STRL BLUE (TOWEL DISPOSABLE) IMPLANT
TRAP FLUID SMOKE EVACUATOR (MISCELLANEOUS) ×1 IMPLANT
WATER STERILE IRR 1000ML POUR (IV SOLUTION) ×1 IMPLANT
WRAPON POLAR PAD KNEE (MISCELLANEOUS) ×1

## 2023-07-11 NOTE — Op Note (Signed)
Patient Name: [name]  NFA:[id number]  Pre-Operative Diagnosis: Right knee Osteoarthritis  Post-Operative Diagnosis: (same)  Procedure: Right Total Knee Arthroplasty  Components/Implants: Femur: Persona Size 9 Narrow CR   Tibia: Persona Size E w/ 14x28mm stem extension  Poly: 10mm MC  Patella: 35x7mm symmetric  Femoral Valgus Cut Angle: 5 degrees  Distal Femoral Re-cut: yes +67mm  Patella Resurfacing: Yes   Date of Surgery: 07/11/2023  Surgeon: Reinaldo Berber MD  Assistant: Amador Cunas PA (present and scrubbed throughout the case, critical for assistance with exposure, retraction, instrumentation, and closure)   Anesthesiologist: Laural Benes  Anesthesia: Spinal   Tourniquet Time: 60 min  EBL: 50cc  IVF: 800cc  Complications: None   Brief history: The patient is a 82 year old female with a history of osteoarthritis of the right knee with pain limiting their range of motion and activities of daily living, which has failed multiple attempts at conservative therapy.  The risks and benefits of total knee arthroplasty as definitive surgical treatment were discussed with the patient, who opted to proceed with the operation.  After outpatient medical clearance and optimization was completed the patient was admitted to [facility] for the procedure.  All preoperative films were reviewed and an appropriate surgical plan was made prior to surgery. Preoperative range of motion was 5 to 115 with a 5 degree flexion contracture. The patient was identified as having a Valgus alignment.   Description of procedure: The patient was brought to the operating room where laterality was confirmed by all those present to be the right side.   Spinal anesthesia was administered and the patient received an intravenous dose of antibiotics for surgical prophylaxis and a dose of tranexamic acid.  Patient is positioned supine on the operating room table with all bony prominences  well-padded.  A well-padded tourniquet was applied to the right thigh.  The knee was then prepped and draped in usual sterile fashion with multiple layers of adhesive and nonadhesive drapes.  All of those present in the operating room participated in a surgical timeout laterality and patient were confirmed.   An Esmarch was wrapped around the extremity and the leg was elevated and the knee flexed.  The tourniquet was inflated to a pressure of 275 mmHg. The Esmarch was removed and the leg was brought down to full extension.  The patella and tibial tubercle identified and outlined using a marking pen and a midline skin incision was made with a knife carried through the subcutaneous tissue down to the extensor retinaculum.  After exposure of the extensor mechanism the medial parapatellar arthrotomy was performed with a scalpel and electrocautery extending down medial and distal to the tibial tubercle taking care to avoid incising the patellar tendon.   A standard medial release was performed over the proximal tibia.  The knee was brought into extension in order to excise the fat pad taking care not to damage the patella tendon.  The superior soft tissue was removed from the anterior surface of the distal femur to visualize for the procedure.  The knee was then brought into flexion with the patella subluxed laterally and subluxing the tibia anteriorly.  The ACL was transected and removed with electrocautery and additional soft tissue was removed from the proximal surface of the tibia to fully expose. The PCL was found to be intact and was preserved.  An extramedullary tibial cutting guide was then applied to the leg with a spring-loaded ankle clamp placed around the distal tibia just above the malleoli  the angulation of the guide was adjusted to give some posterior slope in the tibial resection with an appropriate varus/valgus alignment.  The resection guide was then pinned to the proximal tibia and the proximal  tibial surface was resected with an oscillating saw.  Careful attention was paid to ensure the blade did not disrupt any of the soft tissues including any lateral or medial ligament.  Attention was then turned to the femur, with the knee slightly flexed a opening drill was used to enter the medullary canal of the femur.  After removing the drill marrow was suctioned out to decompress the distal femur.  An intramedullary femoral guide was then inserted into the drill hole and the alignment guide was seated firmly against the distal end of the medial femoral condyle.  The distal femoral cutting guide was then attached and pinned securely to the anterior surface of the femur and the intramedullary rod and alignment guide was removed.  Distal femur resection was then performed with an oscillating saw with retractors protecting medial and laterally.   The distal cutting block was then removed and the extension gap was checked with a spacer.  Extension gap was found to be appropriately sized to accommodate the spacer block. The spacer block was found to be tight and did not allow full extension so the femoral cutting guide was reattached and additional 2 mm of distal femur were resected which then allowed full extension of the knee.   The femoral sizing guide was then placed securely into the posterior condyles of the femur and the femoral size was measured and determined to be 9.  The size 9; 4-in-1 cutting guide was placed in position and secured with 2 pins.  The anterior posterior and chamfer resections were then performed with an oscillating saw.  Bony fragments and osteophytes were then removed.  Using a lamina spreader the posterior medial and lateral condyles were checked for additional osteophytes and posterior soft tissue remnants.  Any remaining meniscus was removed at this time.  Periarticular injection was performed in the meniscal rims and posterior capsule with aspiration performed to ensure no  intravascular injection.   The tibia was then exposed and the tibial trial was pinned onto the plateau after confirming appropriate orientation and rotation.  Using the drill bushing the tibia was prepared to the appropriate drill depth.  Tibial broach impactor was then driven through the punch guide using a mallet.  The femoral trial component was then inserted onto the femur. A trial tibial polyethylene bearing was then placed and the knee was reduced.  The knee achieved full extension with no hyperextension and was found to be balanced in flexion and extension with the trials in place.  The knee was then brought into full extension the patella was everted and held with 2 Kocher clamps.  The articular surface of the patella was then resected with an patella reamer and saw after careful measurement with a caliper.  The patella was then prepared with the drill guide and a trial patella was placed.  The knee was then taken through range of motion and it was found that the patella articulated appropriately with the trochlea and good patellofemoral motion without subluxation.    The correct final components for implantation were confirmed and opened by the circulator nurse.  The prepared surfaces of the patella femur and tibia were cleaned with pulsatile lavage to remove all blood fat and other material and then the surfaces were dried.  2 bags of  cement were mixed under vacuum and the components were cemented into place.  Excess cement was removed with curettes and forceps. A trial polyethylene tibial component was placed and the knee was brought into extension to allow the cement to set.  At this time the periarticular injection cocktail was placed in the soft tissues surrounding the knee.  After full curing of the cement the balance of the knee was checked again and the final polyethylene size was confirmed. The tibial component was irrigated and locking mechanism checked to ensure it was clear of debris. The  real polyethylene tibial component was implanted and the knee was brought through a range of motion.   The knee was then irrigated with copious amount of normal saline via pulsatile lavage to remove all loose bodies and other debris.  The knee was then irrigated with surgiphor betadine based wash and reirrigated with saline.  The tourniquet was then dropped and all bleeding vessels were identified and coagulated.  The arthrotomy was approximated with #1 Vicryl and closed with #2 Quill suture.  The knee was brought into slight flexion and the subcutaneous tissues were closed with 0 Vicryl, 2-0 Vicryl and a running subcuticular 3-0 monoderm quil suture.  Skin was then glued with Dermabond.  A sterile adhesive dressing was then placed along with a sequential compression device to the calf, a Ted stocking, and a cryotherapy cuff.   Sponge, needle, and Lap counts were all correct at the end of the case.   The patient was transferred off of the operating room table to a hospital bed, good pulses were found distally on the operative side.  The patient was transferred to the recovery room in stable condition.

## 2023-07-11 NOTE — H&P (Signed)
History of Present Illness: The patient is an 82 y.o. female seen in clinic today for history and physical for right total knee arthroplasty with Dr. Audelia Acton on 07/11/2023. Patient has severe right knee osteoarthritis with valgus deformity to the right knee that has been increasing over the last few years. Patient's pain is 8 out of 10 and interferes with her quality of life and activities day living. She has had cortisone injections with minimal relief, last injection 01/12/2023. She has tried numerous braces with very little improvement and stability and knee pain. She takes Tylenol for pain. Despite strengthening exercises and the injections she is not having improvement in function or quality use of her right knee. The patient denies fevers, chills, numbness, tingling, shortness of breath, chest pain, recent illness, or any trauma.  The patient is a non-smoker with a BMI of 21.1 she is a nondiabetic with an A1c of 6.2  Past Medical History: Past Medical History:  Diagnosis Date  Arthritis  Hyperlipidemia  Hyperplastic colon polyp 12/29/2015  Hypothyroidism  PVC (premature ventricular contraction)   Past Surgical History: Past Surgical History:  Procedure Laterality Date  COLONOSCOPY 12/29/2015  Hyperplastic colon polyp/No Repeat/PYO  ABDOMINAL HYSTERECTOMY  BLEPHAROPLASTY  REPAIR BROW PTOSIS  REPAIR VAGINAL PROLAPSE   Past Family History: Family History  Problem Relation Age of Onset  Breast cancer Mother  Diabetes Mother  Myocardial Infarction (Heart attack) Mother  Diabetes Father  Stroke Father  Pancreatic cancer Sister  Liver cancer Brother  Diabetes Brother  Stomach cancer Brother  Myocardial Infarction (Heart attack) Brother   Medications: Current Outpatient Medications  Medication Sig Dispense Refill  aspirin 81 MG EC tablet Take by mouth once daily.  cholecalciferol (VITAMIN D3) 1000 unit tablet Take by mouth  dilTIAZem (CARDIZEM CD) 120 MG XR capsule   diphenhydrAMINE-acetaminophen (TYLENOL PM) 25-500 mg per tablet Take by mouth  GLUCOSAMINE HCL/CHONDR SU A NA (OSTEO BI-FLEX ORAL) Take by mouth once daily.  isosorbide mononitrate (IMDUR) 30 MG ER tablet Take 1 tablet by mouth once daily  levothyroxine (SYNTHROID, LEVOTHROID) 50 MCG tablet TAKE 1 TABLET (50 MCG TOTAL) BY MOUTH DAILY.  methocarbamoL (ROBAXIN) 500 MG tablet Take 2 tablets by mouth 3 (three) times daily  multivitamin with minerals tablet Take by mouth once daily.  pantoprazole (PROTONIX) 20 MG DR tablet Take 1 tablet by mouth once daily  rosuvastatin (CRESTOR) 10 MG tablet rosuvastatin 10 mg tablet  traZODone (DESYREL) 50 MG tablet Take 0.5-1 tablets by mouth at bedtime as needed for Sleep  urea (CARMOL) 20 % cream Apply topically once daily 85 g 2   No current facility-administered medications for this visit.   Allergies: Allergies  Allergen Reactions  Prednisone Other (See Comments)  "crazy"    Visit Vitals: Vitals:  07/06/23 1419  BP: (!) 142/82    Review of Systems:  A comprehensive 14 point ROS was performed, reviewed, and the pertinent orthopaedic findings are documented in the HPI.  Physical Exam: General:  Well developed, well nourished, no apparent distress, normal affect, antalgic gait with valgus thrust  HEENT: Head normocephalic, atraumatic, PERRL.   Abdomen: Soft, non tender, non distended, Bowel sounds present.  Heart: Examination of the heart reveals regular, rate, and rhythm. There is no murmur noted on ascultation. There is a normal apical pulse.  Lungs: Lungs are clear to auscultation. There is no wheeze, rhonchi, or crackles. There is normal expansion of bilateral chest walls.   Comprehensive Knee Exam: Gait Antalgic on the right with a valgus  thrust  Alignment Valgus deformity with a thrust on the right   Inspection Right Left  Skin Normal appearance with no obvious deformity. No ecchymosis or erythema. Normal appearance with no  obvious deformity. No ecchymosis or erythema.  Soft Tissue No focal soft tissue swelling No focal soft tissue swelling  Quad Atrophy None None   Palpation  Right Left  Tenderness Lateral joint line and parapatellar tenderness to palpation Mild lateral joint line tenderness palpation  Crepitus + patellofemoral and tibiofemoral crepitus + patellofemoral crepitus  Effusion None None   Range of Motion Right Left  Flexion 5-115 0-125  Extension 5 degree flexion contracture Full knee extension without hyperextension   Ligamentous Exam Right Left  Lachman Normal Normal  Valgus 0 Normal with no opening Normal  Valgus 30 Normal Normal  Varus 0 Partially correctable deformity with endpoint Correctable deformity with endpoint  Varus 30 Normal Normal  Anterior Drawer Normal Normal  Posterior Drawer Normal Normal   Meniscal Exam Right Left  Hyperflexion Test Positive Negative  Hyperextension Test Positive Negative  McMurray's Negative Negative   Neurovascular Right Left  Quadriceps Strength 5/5 5/5  Hamstring Strength 5/5 5/5  Hip Abductor Strength 4/5 4/5  Distal Motor Normal Normal  Distal Sensory Normal light touch sensation Normal light touch sensation  Distal Pulses Normal Normal    Imaging Studies: Weightbearing x-rays of the left and right knee reviewed by me today from earlier this year. There is severe degenerative changes in the right knee with lateral and medial joint space narrowing as well as patellofemoral joint space narrowing with valgus deformity bone-on-bone articulation laterally with osteophyte formation, sclerosis, and subchondral cyst formation. 12 degree valgus tibiofemoral angle on AP film. Kellgren-Lawrence grade 4. The left knee shows medial and lateral joint space narrowing with mild valgus deformity. There is sclerosis, osteophyte formation, and subchondral cyst formation Kellgren-Lawrence grade 3/4. No fractures or dislocations noted in either  knee.  Assessment:  Right knee advanced osteoarthritis.  Plan: Breleigh is an 82 year old female with advanced right knee osteoarthritis. She has had severe debilitating pain interfering with her quality of life and activities daily living that has failed to respond to conservative treatment consisting of medications, bracing, injections. She has a significant valgus deformity to the right knee. Risks, benefits, complications of a right total knee arthroplasty were discussed with the patient. Patient has agreed and consented procedure with Dr. Audelia Acton on 07/11/2023  The hospitalization and post-operative care and rehabilitation were also discussed. The use of perioperative antibiotics and DVT prophylaxis were discussed. The risk, benefits and alternatives to a surgical intervention were discussed at length with the patient. The patient was also advised of risks related to the medical comorbidities and elevated body mass index (BMI). A lengthy discussion took place to review the most common complications including but not limited to: stiffness, loss of function, complex regional pain syndrome, deep vein thrombosis, pulmonary embolus, heart attack, stroke, infection, wound breakdown, numbness, intraoperative fracture, damage to nerves, tendon,muscles, arteries or other blood vessels, death and other possible complications from anesthesia. The patient was told that we will take steps to minimize these risks by using sterile technique, antibiotics and DVT prophylaxis when appropriate and follow the patient postoperatively in the office setting to monitor progress. The possibility of recurrent pain, no improvement in pain and actual worsening of pain were also discussed with the patient. We did have a  specific conversation about her valgus deformity and her increased risk of peroneal nerve injury and dropfoot and  numbness after surgery. The patient acknowledges these increased risks.   The patient received  clearance for surgery. All questions answered, agrees with plan for right total knee arthroplasty.

## 2023-07-11 NOTE — Progress Notes (Signed)
PT Cancellation Note  Patient Details Name: [name] MRN: [id number] DOB: December 13, 1940   Cancelled Treatment:    Reason Eval/Treat Not Completed: Patient not medically ready.  PT consult received.  Chart reviewed.  Pt currently in PACU.  Per nursing, pt is not medically appropriate for therapy at this time.  Will re-attempt PT evaluation tomorrow.  Hendricks Limes, PT 07/11/23, 4:33 PM

## 2023-07-11 NOTE — Anesthesia Procedure Notes (Signed)
Procedure Name: MAC Date/Time: 07/11/2023 1:25 PM  Performed by: Lily Lovings, CRNAPre-anesthesia Checklist: Patient identified, Emergency Drugs available, Suction available and Patient being monitored Patient Re-evaluated:Patient Re-evaluated prior to induction Oxygen Delivery Method: Simple face mask Preoxygenation: Pre-oxygenation with 100% oxygen Induction Type: IV induction

## 2023-07-11 NOTE — Interval H&P Note (Signed)
Patient history and physical updated. Consent reviewed including risks, benefits, and alternatives to surgery. Patient agrees with above plan to proceed with right total knee arthroplasty  

## 2023-07-11 NOTE — Transfer of Care (Signed)
Immediate Anesthesia Transfer of Care Note  Patient: [name]  Procedure(s) Performed: TOTAL KNEE ARTHROPLASTY (Right: Knee)  Patient Location: PACU  Anesthesia Type:Spinal  Level of Consciousness: awake, oriented, and patient cooperative  Airway & Oxygen Therapy: Patient Spontanous Breathing  Post-op Assessment: Report given to RN and Post -op Vital signs reviewed and stable  Post vital signs: Reviewed and stable  Last Vitals:  Vitals Value Taken Time  BP 131/65 07/11/23 1525  Temp 36.6 C 07/11/23 1525  Pulse 61 07/11/23 1527  Resp 15 07/11/23 1527  SpO2 98 % 07/11/23 1527  Vitals shown include unfiled device data.  Last Pain:  Vitals:   07/11/23 1149  PainSc: 3       Patients Stated Pain Goal: 2 (07/11/23 1149)  Complications: No notable events documented.

## 2023-07-11 NOTE — Anesthesia Procedure Notes (Signed)
Spinal  Patient location during procedure: OR Start time: 07/11/2023 1:20 PM End time: 07/11/2023 1:25 PM Reason for block: surgical anesthesia Staffing Performed: resident/CRNA  Anesthesiologist: Foye Deer, MD Resident/CRNA: Lily Lovings, CRNA Performed by: Lily Lovings, CRNA Authorized by: Foye Deer, MD   Preanesthetic Checklist Completed: patient identified, IV checked, site marked, risks and benefits discussed, surgical consent, monitors and equipment checked and pre-op evaluation Spinal Block Patient position: sitting Prep: Betadine Patient monitoring: heart rate, continuous pulse ox, blood pressure and cardiac monitor Approach: midline Location: L4-5 Injection technique: single-shot Needle Needle type: Introducer and Pencan  Needle gauge: 24 G Needle length: 10 cm Assessment Events: CSF return Additional Notes Negative paresthesia. Negative blood return. Positive free-flowing CSF. Expiration date of kit checked and confirmed. Patient tolerated procedure well, without complications.

## 2023-07-12 ENCOUNTER — Encounter: Payer: Self-pay | Admitting: Orthopedic Surgery

## 2023-07-12 DIAGNOSIS — M1711 Unilateral primary osteoarthritis, right knee: Secondary | ICD-10-CM | POA: Diagnosis not present

## 2023-07-12 DIAGNOSIS — Z96651 Presence of right artificial knee joint: Secondary | ICD-10-CM | POA: Diagnosis not present

## 2023-07-12 LAB — CBC
HCT: 33.8 % — ABNORMAL LOW (ref 36.0–46.0)
Hemoglobin: 12 g/dL (ref 12.0–15.0)
MCH: 32.3 pg (ref 26.0–34.0)
MCHC: 35.5 g/dL (ref 30.0–36.0)
MCV: 90.9 fL (ref 80.0–100.0)
Platelets: 227 10*3/uL (ref 150–400)
RBC: 3.72 MIL/uL — ABNORMAL LOW (ref 3.87–5.11)
RDW: 11.5 % (ref 11.5–15.5)
WBC: 12.2 10*3/uL — ABNORMAL HIGH (ref 4.0–10.5)
nRBC: 0 % (ref 0.0–0.2)

## 2023-07-12 LAB — BASIC METABOLIC PANEL
Anion gap: 9 (ref 5–15)
BUN: 13 mg/dL (ref 8–23)
CO2: 24 mmol/L (ref 22–32)
Calcium: 8.6 mg/dL — ABNORMAL LOW (ref 8.9–10.3)
Chloride: 100 mmol/L (ref 98–111)
Creatinine, Ser: 0.77 mg/dL (ref 0.44–1.00)
GFR, Estimated: >60 mL/min (ref >60)
Glucose, Bld: 140 mg/dL — ABNORMAL HIGH (ref 70–99)
Potassium: 4 mmol/L (ref 3.5–5.1)
Sodium: 133 mmol/L — ABNORMAL LOW (ref 135–145)

## 2023-07-12 MED ORDER — ONDANSETRON HCL 4 MG PO TABS: 4.0000 mg | ORAL_TABLET | Freq: Four times a day (QID) | ORAL | 0 refills | Status: DC | PRN

## 2023-07-12 MED ORDER — DOCUSATE SODIUM 100 MG PO CAPS: 100.0000 mg | ORAL_CAPSULE | Freq: Two times a day (BID) | ORAL | 0 refills | Status: DC

## 2023-07-12 MED ORDER — TRAMADOL HCL 50 MG PO TABS: 50.0000 mg | ORAL_TABLET | Freq: Four times a day (QID) | ORAL | 0 refills | Status: DC | PRN

## 2023-07-12 MED ORDER — ENOXAPARIN SODIUM 40 MG/0.4ML IJ SOSY: 40.0000 mg | PREFILLED_SYRINGE | INTRAMUSCULAR | 0 refills | Status: DC

## 2023-07-12 MED ORDER — ACETAMINOPHEN 500 MG PO TABS: 1000.0000 mg | ORAL_TABLET | Freq: Three times a day (TID) | ORAL | 0 refills | Status: AC

## 2023-07-12 MED ORDER — CELECOXIB 200 MG PO CAPS
200.0000 mg | ORAL_CAPSULE | Freq: Two times a day (BID) | ORAL | 0 refills | Status: AC
Start: 2023-07-12 — End: 2023-07-22

## 2023-07-12 NOTE — Progress Notes (Addendum)
Subjective: 1 Day Post-Op Procedure(s) (LRB): TOTAL KNEE ARTHROPLASTY (Right) Patient reports pain as mild.   Patient is well, and has had no acute complaints or problems Denies any CP, SOB, ABD pain. We will continue therapy today.  Plan is to go Home after hospital stay.  Objective: Vital signs in last 24 hours: Temp:  [97.5 F (36.4 C)-98.3 F (36.8 C)] 98 F (36.7 C) (08/20 0318) Pulse Rate:  [40-72] 49 (08/20 0318) Resp:  [12-18] 18 (08/20 0318) BP: (63-152)/(46-95) 143/62 (08/20 0318) SpO2:  [91 %-100 %] 96 % (08/20 0318) Weight:  [60.8 kg] 60.8 kg (08/19 1149)  Intake/Output from previous day: 08/19 0701 - 08/20 0700 In: 2569.1 [P.O.:200; I.V.:1769.1; IV Piggyback:600] Out: 450 [Urine:400; Blood:50] Intake/Output this shift: No intake/output data recorded.  Recent Labs    07/12/23 0412  HGB 12.0   Recent Labs    07/12/23 0412  WBC 12.2*  RBC 3.72*  HCT 33.8*  PLT 227   Recent Labs    07/12/23 0412  NA 133*  K 4.0  CL 100  CO2 24  BUN 13  CREATININE 0.77  GLUCOSE 140*  CALCIUM 8.6*   No results for input(s): "LABPT", "INR" in the last 72 hours.  EXAM General - Patient is Alert, Appropriate, and Oriented Extremity - Neurovascular intact Sensation intact distally Intact pulses distally Dorsiflexion/Plantar flexion intact No cellulitis present Compartment soft Dressing - dressing C/D/I and no drainage Motor Function - intact, moving foot and toes well on exam.   Past Medical History:  Diagnosis Date   Abdominal aortic atherosclerosis (HCC)    Arthritis of both knees    Atypical chest pain    Basal cell carcinoma 10/23/2020   right lateral ankle EDC done 12/18/20   Basal cell carcinoma 05/13/2021   right pretibia, [facility] 07/22/21   Basal cell carcinoma 12/03/2021   left chest, EDC 01/06/2022   Basal cell carcinoma (BCC) 05/13/2021   left forehead, Moh's 07/29/2021   Candida esophagitis (HCC) 06/30/2021   Complication of anesthesia    slow  to wake up   E. coli UTI 09/10/2015   Resistant to cipro .  Keflex sent x 5 days    Elevated blood pressure reading in office with diagnosis of hypertension    GERD (gastroesophageal reflux disease)    History of dysplastic nevus 05/13/2021   right dorsal hand, mild   Hyperlipidemia    Hypothyroidism    Insomnia    a.) uses trazodone PRN   Laceration of head without foreign body 11/18/2022   Long term current use of aspirin    Peripheral neuropathy    Pre-diabetes    PVC's (premature ventricular contractions)    a.  48-hour Holter in 3/17 demonstrated NSR, frequent PVCs totaling 5300 beats representing 3% burden, occasional PACs, short runs of SVT   Raynaud's disease    Syncope and collapse    r/t bp meds   Vitamin D deficiency     Assessment/Plan:   1 Day Post-Op Procedure(s) (LRB): TOTAL KNEE ARTHROPLASTY (Right) Principal Problem:   S/P TKR (total knee replacement) using cement, right  Estimated body mass index is 20.99 kg/m as calculated from the following:   Height as of this encounter: 5\' 7"  (1.702 m).   Weight as of this encounter: 60.8 kg. Advance diet Up with therapy Pain well-controlled Vital signs are stable Labs are stable Care management to assist with discharge to home with home health PT today pending safe completion of PT goals.  DVT Prophylaxis - Lovenox, TED hose, and SCDs Weight-Bearing as tolerated to right leg   T. Cranston Neighbor, PA-C [facility] 07/12/2023, 7:44 AM  Patient seen and examined, agree with above plan.  The patient is doing well status post right total knee arthroplasty, no concerns at this time.  Pain is controlled.  Discussed DVT prophylaxis, pain medication use, and safe transition to home.  All questions answered the patient agrees with above plan will go home after clears PT.   Reinaldo Berber MD

## 2023-07-12 NOTE — Discharge Summary (Signed)
Physician Discharge Summary  Patient ID: [name] MRN: [id number] DOB/AGE: 1941/03/02 82 y.o.  Admit date: 07/11/2023 Discharge date: 07/12/2023  Admission Diagnoses:  S/P TKR (total knee replacement) using cement, right [Z96.651]   Discharge Diagnoses: Patient Active Problem List   Diagnosis Date Noted   S/P TKR (total knee replacement) using cement, right 07/11/2023   Syncope and collapse 12/04/2022   Peripheral neuropathy 12/30/2021   Insomnia disorder 09/25/2020   Elevated blood pressure reading in office with diagnosis of hypertension 03/22/2020   Polyp of ascending colon    Constipation 09/15/2019   Prediabetes 09/05/2017   Raynaud disease 09/05/2017   Abdominal aortic atherosclerosis (HCC) 03/06/2017   Preoperative evaluation to rule out surgical contraindication 09/05/2016   Other fatigue 09/05/2016   PVC's (premature ventricular contractions) 11/04/2015   Atypical chest pain 11/03/2015   Tinnitus 09/17/2015   Family history of colon cancer 09/17/2015   Low back pain 06/24/2015   Dysuria 03/08/2015   Medicare annual wellness visit, subsequent 12/07/2013   Left carotid bruit 12/07/2013   Varicose veins of lower extremities with other complications 04/12/2013   Vitamin D deficiency 08/04/2012   Familial hyperlipidemia 11/17/2011   Acquired hypothyroidism 11/17/2011    Past Medical History:  Diagnosis Date   Abdominal aortic atherosclerosis (HCC)    Arthritis of both knees    Atypical chest pain    Basal cell carcinoma 10/23/2020   right lateral ankle EDC done 12/18/20   Basal cell carcinoma 05/13/2021   right pretibia, [facility] 07/22/21   Basal cell carcinoma 12/03/2021   left chest, EDC 01/06/2022   Basal cell carcinoma (BCC) 05/13/2021   left forehead, Moh's 07/29/2021   Candida esophagitis (HCC) 06/30/2021   Complication of anesthesia    slow to wake up   E. coli UTI 09/10/2015   Resistant to cipro .  Keflex sent x 5 days    Elevated blood pressure  reading in office with diagnosis of hypertension    GERD (gastroesophageal reflux disease)    History of dysplastic nevus 05/13/2021   right dorsal hand, mild   Hyperlipidemia    Hypothyroidism    Insomnia    a.) uses trazodone PRN   Laceration of head without foreign body 11/18/2022   Long term current use of aspirin    Peripheral neuropathy    Pre-diabetes    PVC's (premature ventricular contractions)    a.  48-hour Holter in 3/17 demonstrated NSR, frequent PVCs totaling 5300 beats representing 3% burden, occasional PACs, short runs of SVT   Raynaud's disease    Syncope and collapse    r/t bp meds   Vitamin D deficiency      Transfusion: None   Consultants (if any):   Discharged Condition: Improved  Hospital Course: JOHARA PERCHES is an 82 y.o. female who was admitted 07/11/2023 with a diagnosis of S/P TKR (total knee replacement) using cement, right and went to the operating room on 07/11/2023 and underwent the above named procedures.    Surgeries: Procedure(s): TOTAL KNEE ARTHROPLASTY on 07/11/2023 Patient tolerated the surgery well. Taken to PACU where she was stabilized and then transferred to the orthopedic floor.  Started on Lovenox 30 mg q 12 hrs. TEDs and SCDs applied bilaterally. Heels elevated on bed. No evidence of DVT. Negative Homan. Physical therapy started on day #1 for gait training and transfer. OT started day #1 for ADL and assisted devices.  Patient's IV was d/c on day #1. Patient was able to safely and independently  complete all PT goals. PT recommending discharge to home.    On post op day #1 patient was stable and ready for discharge to home with home health PT.  Implants: Femur: Persona Size 9 Narrow CR   Tibia: Persona Size E w/ 14x53mm stem extension  Poly: 10mm MC  Patella: 35x88mm symmetric   She was given perioperative antibiotics:  Anti-infectives (From admission, onward)    Start     Dose/Rate Route Frequency Ordered Stop   07/11/23 2000   ceFAZolin (ANCEF) IVPB 2g/100 mL premix        2 g 200 mL/hr over 30 Minutes Intravenous Every 6 hours 07/11/23 1903 07/12/23 0216   07/11/23 0600  ceFAZolin (ANCEF) IVPB 2g/100 mL premix        2 g 200 mL/hr over 30 Minutes Intravenous On call to O.R. 07/11/23 0301 07/11/23 1341     .  She was given sequential compression devices, early ambulation, and Lovenox, teds for DVT prophylaxis.  She benefited maximally from the hospital stay and there were no complications.    Recent vital signs:  Vitals:   07/11/23 2353 07/12/23 0318  BP: (!) 152/68 (!) 143/62  Pulse: 62 (!) 49  Resp: 18 18  Temp: 97.7 F (36.5 C) 98 F (36.7 C)  SpO2: 97% 96%    Recent laboratory studies:  Lab Results  Component Value Date   HGB 12.0 07/12/2023   HGB 13.2 07/04/2023   HGB 12.2 11/11/2022   Lab Results  Component Value Date   WBC 12.2 (H) 07/12/2023   PLT 227 07/12/2023   No results found for: "INR" Lab Results  Component Value Date   NA 133 (L) 07/12/2023   K 4.0 07/12/2023   CL 100 07/12/2023   CO2 24 07/12/2023   BUN 13 07/12/2023   CREATININE 0.77 07/12/2023   GLUCOSE 140 (H) 07/12/2023    Discharge Medications:   Allergies as of 07/12/2023       Reactions   Prednisone    "crazy"        Medication List     TAKE these medications    acetaminophen 500 MG tablet Commonly known as: TYLENOL Take 2 tablets (1,000 mg total) by mouth every 8 (eight) hours.   aspirin EC 81 MG tablet Take 81 mg by mouth daily.   celecoxib 200 MG capsule Commonly known as: CeleBREX Take 1 capsule (200 mg total) by mouth 2 (two) times daily for 10 days.   cholecalciferol 1000 units tablet Commonly known as: VITAMIN D Take 1,000 Units by mouth daily.   Cranberry 450 MG Caps Take 900 mg by mouth daily. Urinary health supplement   diltiazem 120 MG 24 hr capsule Commonly known as: CARDIZEM CD Take 1 capsule (120 mg total) by mouth daily. What changed: when to take this    diphenhydramine-acetaminophen 25-500 MG Tabs tablet Commonly known as: TYLENOL PM Take 1 tablet by mouth at bedtime as needed.   docusate sodium 100 MG capsule Commonly known as: COLACE Take 1 capsule (100 mg total) by mouth 2 (two) times daily.   enoxaparin 40 MG/0.4ML injection Commonly known as: LOVENOX Inject 0.4 mLs (40 mg total) into the skin daily for 14 days.   Fish Oil 1000 MG Cpdr Take 1,200 mg by mouth daily.   glucosamine-chondroitin 500-400 MG tablet Take 1 tablet by mouth daily.   isosorbide mononitrate 30 MG 24 hr tablet Commonly known as: IMDUR TAKE 1 TABLET BY MOUTH EVERY DAY What changed: when  to take this   levothyroxine 50 MCG tablet Commonly known as: SYNTHROID TAKE 1 TABLET BY MOUTH  DAILY   multivitamin with minerals tablet Take 1 tablet by mouth daily.   ondansetron 4 MG tablet Commonly known as: ZOFRAN Take 1 tablet (4 mg total) by mouth every 6 (six) hours as needed for nausea.   pantoprazole 20 MG tablet Commonly known as: PROTONIX TAKE 1 TABLET BY MOUTH EVERY DAY   rosuvastatin 10 MG tablet Commonly known as: CRESTOR Take 1 tablet (10 mg total) by mouth daily. What changed: when to take this   traMADol 50 MG tablet Commonly known as: ULTRAM Take 1 tablet (50 mg total) by mouth every 6 (six) hours as needed for moderate pain.   traZODone 50 MG tablet Commonly known as: DESYREL TAKE 1/2 TO 1 TABLET BY MOUTH AT BEDTIME AS NEEDED FOR SLEEP        Diagnostic Studies: DG Knee Right Port  Result Date: 07/11/2023 CLINICAL DATA:  Status post knee replacement. EXAM: PORTABLE RIGHT KNEE - 1-2 VIEW COMPARISON:  None Available. FINDINGS: Right knee arthroplasty in expected alignment. No periprosthetic lucency or fracture. There has been patellar resurfacing. Recent postsurgical change includes air and edema in the soft tissues and joint space. IMPRESSION: Right knee arthroplasty without immediate postoperative complication. Electronically  Signed   By: Narda Rutherford M.D.   On: 07/11/2023 16:27   DG Chest 2 View  Result Date: 06/21/2023 CLINICAL DATA:  Preop exam. EXAM: CHEST - 2 VIEW COMPARISON:  11/03/2015. FINDINGS: Cardiac silhouette is normal in size. No mediastinal or hilar masses. No evidence of adenopathy. Lungs are hyperexpanded, but clear. No pleural effusion or pneumothorax. Skeletal structures are intact. IMPRESSION: 1. No acute cardiopulmonary disease. 2. Hyperexpanded lungs suggests COPD. Electronically Signed   By: Amie Portland M.D.   On: 06/21/2023 12:01    Disposition:      Follow-up Information     Evon Slack, PA-C Follow up in 2 week(s).   Specialties: Orthopedic Surgery, Emergency Medicine Contact information: [address] [place] [address] [phone]                  Signed: Patience Musca 07/12/2023, 7:47 AM

## 2023-07-12 NOTE — Discharge Instructions (Signed)

## 2023-07-12 NOTE — Anesthesia Postprocedure Evaluation (Signed)
Anesthesia Post Note  Patient: [name]  Procedure(s) Performed: TOTAL KNEE ARTHROPLASTY (Right: Knee)  Patient location during evaluation: Nursing Unit Anesthesia Type: Spinal Level of consciousness: awake and alert and oriented Pain management: pain level controlled Vital Signs Assessment: post-procedure vital signs reviewed and stable Respiratory status: respiratory function stable Cardiovascular status: stable Postop Assessment: no headache, no backache, patient able to bend at knees, no apparent nausea or vomiting, able to ambulate and adequate PO intake Anesthetic complications: no   No notable events documented.   Last Vitals:  Vitals:   07/12/23 0318 07/12/23 0818  BP: (!) 143/62 (!) 138/56  Pulse: (!) 49 65  Resp: 18 16  Temp: 36.7 C 37.6 C  SpO2: 96% 97%    Last Pain:  Vitals:   07/12/23 0318  TempSrc: Oral  PainSc:                  Zachary George

## 2023-07-12 NOTE — TOC Progression Note (Addendum)
Transition of Care [facility]) - Progression Note    Patient Details  Name: [name] MRN: [id number] Date of Birth: Dec 22, 1940  [facility] CM/SW Contact  Marlowe Sax, RN Phone Number: 07/12/2023, 9:22 AM  Clinical Narrative:     Spoke with the patient's daughter [name], the patient has a BSC and needs a RW, [employer] to deliver to the room prior to DC, Frances Furbish was set up with for [facility] prior to surgery by surgeons office  Expected Discharge Plan: Home w Home Health Services Barriers to Discharge: No Barriers Identified  Expected Discharge Plan and Services   Discharge Planning Services: CM Consult Post Acute Care Choice: Home Health   Expected Discharge Date: 07/12/23               DME Arranged: Dan Humphreys rolling DME Agency: [employer] Date DME Agency Contacted: 07/12/23 Time DME Agency Contacted: 0920   HH Arranged: PT HH Agency: [facility] Date [facility] Agency Contacted: 07/12/23 Time HH Agency Contacted: [phone] Representative spoke with at [facility] Agency: Kandee Keen   Social Determinants of Health (SDOH) Interventions SDOH Screenings   Food Insecurity: No Food Insecurity (07/11/2023)  Housing: Low Risk  (07/11/2023)  Transportation Needs: No Transportation Needs (07/11/2023)  Utilities: Not At Risk (07/11/2023)  Depression (PHQ2-9): Low Risk  (06/15/2023)  Financial Resource Strain: Low Risk  (08/18/2022)  Physical Activity: Sufficiently Active (08/18/2022)  Social Connections: Moderately Isolated (08/18/2022)  Stress: No Stress Concern Present (08/18/2022)  Tobacco Use: Medium Risk (07/11/2023)    Readmission Risk Interventions     No data to display

## 2023-07-12 NOTE — Evaluation (Signed)
Physical Therapy Evaluation Patient Details Name: [name] MRN: [id number] DOB: August 19, 1941 Today's Date: 07/12/2023  History of Present Illness  Pt is an 82 y.o. female s/p R TKA secondary to OA 07/11/23.  PMH includes PVC, arthritis, HLD, hypothyroidism, insomnia, peripheral neuropathy, Raynaud's disease.  Clinical Impression  Prior to surgery, pt was independent with functional mobility; lives with her son; pt's daughter is planning to stay with pt and assist as needed.  R knee pain 0/10 at rest beginning of session but 3/10 at rest end of session.  Able to perform R LE SLR independently and R knee flexion AROM to 100 degrees.  Performed LE TKA HEP (appropriate technique noted) and pt issued written handout.  Currently pt is modified independent with bed mobility; SBA with transfers using RW; CGA progressing to SBA ambulating 240 feet with RW use; and CGA to navigate 9 steps with UE support.  Pt educated on home safety, fall prevention, and safe car transfer technique (pt and pt's daughter appearing with appropriate understanding).  Pt would currently benefit from skilled PT to address noted impairments and functional limitations (see below for any additional details).  Upon hospital discharge, pt would benefit from ongoing therapy (pt planning for HHPT).  Pt appears safe to discharge home with support of family when medically appropriate and once RW delivered (MD, PA, TOC, and pt's nurse updated).     If plan is discharge home, recommend the following: A little help with walking and/or transfers;A little help with bathing/dressing/bathroom;Assistance with cooking/housework;Assist for transportation;Help with stairs or ramp for entrance   Can travel by private vehicle    Yes    Equipment Recommendations Rolling walker (2 wheels) (pt has BSC already)  Recommendations for Other Services       Functional Status Assessment Patient has had a recent decline in their functional status and  demonstrates the ability to make significant improvements in function in a reasonable and predictable amount of time.     Precautions / Restrictions Precautions Precautions: Knee;Fall Precaution Booklet Issued: Yes (comment) Restrictions Weight Bearing Restrictions: Yes RLE Weight Bearing: Weight bearing as tolerated      Mobility  Bed Mobility Overal bed mobility: Needs Assistance Bed Mobility: Supine to Sit, Sit to Supine     Supine to sit: Modified independent (Device/Increase time) Sit to supine: Modified independent (Device/Increase time)   General bed mobility comments: mild increased effort to perform on own; bed flat    Transfers Overall transfer level: Needs assistance Equipment used: Rolling walker (2 wheels) Transfers: Sit to/from Stand Sit to Stand: Supervision           General transfer comment: x3 trials from bed; steady with RW use; initial vc's for UE/LE placement/positioning and overall technique    Ambulation/Gait Ambulation/Gait assistance: Contact guard assist, Supervision Gait Distance (Feet): 240 Feet Assistive device: Rolling walker (2 wheels) Gait Pattern/deviations: Antalgic, Decreased stance time - right Gait velocity: decreased     General Gait Details: step to progressing to step through gait pattern with vc's; initial vc's for R knee flexion during R LE swing phase and R heelstrike during initial contact  Stairs Stairs: Yes Stairs assistance: Contact guard assist Stair Management: One rail Right, With walker Number of Stairs: 9 General stair comments: ascended/descended 8 steps with R railing; ascended/descended 1 step with RW; initial vc's for LE sequencing and overall technique; steady  Wheelchair Mobility     Tilt Bed    Modified Rankin (Stroke Patients Only)  Balance Overall balance assessment: Needs assistance Sitting-balance support: No upper extremity supported, Feet supported Sitting balance-Leahy Scale:  Normal Sitting balance - Comments: steady reaching outside BOS   Standing balance support: Bilateral upper extremity supported, During functional activity, Reliant on assistive device for balance Standing balance-Leahy Scale: Good Standing balance comment: steady ambulating with RW use                             Pertinent Vitals/Pain Pain Assessment Pain Assessment: 0-10 Pain Score: 3  Pain Location: R knee Pain Descriptors / Indicators: Sore Pain Intervention(s): Limited activity within patient's tolerance, Monitored during session, Premedicated before session, Repositioned, Other (comment) (polar care applied) Vitals (HR and SpO2 on room air) stable and WFL throughout treatment session.    Home Living Family/patient expects to be discharged to:: Private residence Living Arrangements: Children (Pt's son) Available Help at Discharge: Family;Available 24 hours/day Type of Home: House Home Access: Stairs to enter   [employer] of Steps: 7-8 steps with R rail from garage (also has stair lift); 3 STE with B railings plus 1 STE no railing alternate entrance   Home Layout: One level Home Equipment: Shower seat;Cane - quad;Cane - single point;BSC/3in1;Toilet riser;Grab bars - tub/shower;Shower seat - built in;Grab bars - toilet Additional Comments: Pt's daughter staying with pt to assist as needed.    Prior Function Prior Level of Function : Independent/Modified Independent             Mobility Comments: Independent with ambulation; no recent falls.       Extremity/Trunk Assessment   Upper Extremity Assessment Upper Extremity Assessment: Overall WFL for tasks assessed    Lower Extremity Assessment Lower Extremity Assessment: RLE deficits/detail (L LE WFL) RLE Deficits / Details: able to perform R LE SLR independently; at least 3/5 AROM hip flexion and ankle DF/PF RLE: Unable to fully assess due to pain    Cervical / Trunk Assessment Cervical /  Trunk Assessment: Normal  Communication   Communication Communication: No apparent difficulties Cueing Techniques: Verbal cues  Cognition Arousal: Alert Behavior During Therapy: WFL for tasks assessed/performed Overall Cognitive Status: Within Functional Limits for tasks assessed                                          General Comments General comments (skin integrity, edema, etc.): R knee dressing in place.  Nursing cleared pt for participation in physical therapy.  Pt agreeable to PT session.  Pt's daughter present during session.    Exercises Total Joint Exercises Ankle Circles/Pumps: AROM, Strengthening, Both, 10 reps, Supine Quad Sets: AROM, Strengthening, Right, 10 reps, Supine Short Arc Quad: AROM, Strengthening, Right, 10 reps, Supine Heel Slides: AAROM, Strengthening, Right, 10 reps, Supine Hip ABduction/ADduction: AROM, Strengthening, Right, 10 reps, Supine Straight Leg Raises: AROM, Strengthening, Right, 10 reps, Supine Long Arc Quad: AROM, Strengthening, Right, 10 reps, Seated Knee Flexion: AROM, Strengthening, Right, 10 reps, Seated Goniometric ROM: R knee AROM 0-100 degrees   Assessment/Plan    PT Assessment Patient needs continued PT services  PT Problem List Decreased strength;Decreased range of motion;Decreased activity tolerance;Decreased balance;Decreased mobility;Decreased knowledge of use of DME;Decreased knowledge of precautions;Decreased skin integrity;Pain       PT Treatment Interventions DME instruction;Gait training;Stair training;Functional mobility training;Therapeutic activities;Therapeutic exercise;Balance training;Patient/family education    PT Goals (Current goals can be found in  the Care Plan section)  Acute Rehab PT Goals Patient Stated Goal: to improve mobility PT Goal Formulation: With patient Time For Goal Achievement: 07/26/23 Potential to Achieve Goals: Good    Frequency BID     Co-evaluation                AM-PAC PT "6 Clicks" Mobility  Outcome Measure Help needed turning from your back to your side while in a flat bed without using bedrails?: None Help needed moving from lying on your back to sitting on the side of a flat bed without using bedrails?: A Little Help needed moving to and from a bed to a chair (including a wheelchair)?: A Little Help needed standing up from a chair using your arms (e.g., wheelchair or bedside chair)?: A Little Help needed to walk in hospital room?: A Little Help needed climbing 3-5 steps with a railing? : A Little 6 Click Score: 19    End of Session Equipment Utilized During Treatment: Gait belt Activity Tolerance: Patient tolerated treatment well Patient left: in bed;with call bell/phone within reach;with bed alarm set;with family/visitor present;with SCD's reapplied;Other (comment) (bone foam and polar care in place) Nurse Communication: Mobility status;Precautions;Weight bearing status PT Visit Diagnosis: Other abnormalities of gait and mobility (R26.89);Muscle weakness (generalized) (M62.81);Pain Pain - Right/Left: Right Pain - part of body: Knee    Time: 6578-4696 PT Time Calculation (min) (ACUTE ONLY): 54 min   Charges:   PT Evaluation $PT Eval Low Complexity: 1 Low PT Treatments $Gait Training: 8-22 mins $Therapeutic Exercise: 8-22 mins PT General Charges $$ ACUTE PT VISIT: 1 Visit        Hendricks Limes, PT 07/12/23, 11:37 AM

## 2023-07-12 NOTE — Plan of Care (Signed)

## 2023-07-13 DIAGNOSIS — Z9181 History of falling: Secondary | ICD-10-CM | POA: Diagnosis not present

## 2023-07-13 DIAGNOSIS — Z7982 Long term (current) use of aspirin: Secondary | ICD-10-CM | POA: Diagnosis not present

## 2023-07-13 DIAGNOSIS — Z85828 Personal history of other malignant neoplasm of skin: Secondary | ICD-10-CM | POA: Diagnosis not present

## 2023-07-13 DIAGNOSIS — M1712 Unilateral primary osteoarthritis, left knee: Secondary | ICD-10-CM | POA: Diagnosis not present

## 2023-07-13 DIAGNOSIS — Z96651 Presence of right artificial knee joint: Secondary | ICD-10-CM | POA: Diagnosis not present

## 2023-07-13 DIAGNOSIS — I7300 Raynaud's syndrome without gangrene: Secondary | ICD-10-CM | POA: Diagnosis not present

## 2023-07-13 DIAGNOSIS — Z791 Long term (current) use of non-steroidal anti-inflammatories (NSAID): Secondary | ICD-10-CM | POA: Diagnosis not present

## 2023-07-13 DIAGNOSIS — E7849 Other hyperlipidemia: Secondary | ICD-10-CM | POA: Diagnosis not present

## 2023-07-13 DIAGNOSIS — Z471 Aftercare following joint replacement surgery: Secondary | ICD-10-CM | POA: Diagnosis not present

## 2023-07-13 DIAGNOSIS — Z86010 Personal history of colonic polyps: Secondary | ICD-10-CM | POA: Diagnosis not present

## 2023-07-13 DIAGNOSIS — I10 Essential (primary) hypertension: Secondary | ICD-10-CM | POA: Diagnosis not present

## 2023-07-13 DIAGNOSIS — E039 Hypothyroidism, unspecified: Secondary | ICD-10-CM | POA: Diagnosis not present

## 2023-07-13 DIAGNOSIS — Z87440 Personal history of urinary (tract) infections: Secondary | ICD-10-CM | POA: Diagnosis not present

## 2023-07-13 DIAGNOSIS — I714 Abdominal aortic aneurysm, without rupture, unspecified: Secondary | ICD-10-CM | POA: Diagnosis not present

## 2023-07-13 DIAGNOSIS — R7303 Prediabetes: Secondary | ICD-10-CM | POA: Diagnosis not present

## 2023-07-13 DIAGNOSIS — G629 Polyneuropathy, unspecified: Secondary | ICD-10-CM | POA: Diagnosis not present

## 2023-07-13 DIAGNOSIS — E559 Vitamin D deficiency, unspecified: Secondary | ICD-10-CM | POA: Diagnosis not present

## 2023-07-13 DIAGNOSIS — Z7901 Long term (current) use of anticoagulants: Secondary | ICD-10-CM | POA: Diagnosis not present

## 2023-07-13 DIAGNOSIS — K219 Gastro-esophageal reflux disease without esophagitis: Secondary | ICD-10-CM | POA: Diagnosis not present

## 2023-07-13 DIAGNOSIS — I493 Ventricular premature depolarization: Secondary | ICD-10-CM | POA: Diagnosis not present

## 2023-07-13 DIAGNOSIS — G4700 Insomnia, unspecified: Secondary | ICD-10-CM | POA: Diagnosis not present

## 2023-07-20 ENCOUNTER — Encounter: Payer: Medicare Other | Admitting: Dermatology

## 2023-07-26 DIAGNOSIS — Z471 Aftercare following joint replacement surgery: Secondary | ICD-10-CM | POA: Diagnosis not present

## 2023-07-26 DIAGNOSIS — M25561 Pain in right knee: Secondary | ICD-10-CM | POA: Diagnosis not present

## 2023-07-26 DIAGNOSIS — Z96651 Presence of right artificial knee joint: Secondary | ICD-10-CM | POA: Diagnosis not present

## 2023-07-28 DIAGNOSIS — Z96651 Presence of right artificial knee joint: Secondary | ICD-10-CM | POA: Diagnosis not present

## 2023-08-02 DIAGNOSIS — M25561 Pain in right knee: Secondary | ICD-10-CM | POA: Diagnosis not present

## 2023-08-02 DIAGNOSIS — Z96651 Presence of right artificial knee joint: Secondary | ICD-10-CM | POA: Diagnosis not present

## 2023-08-08 ENCOUNTER — Encounter: Payer: Medicare Other | Admitting: Dermatology

## 2023-08-10 DIAGNOSIS — I129 Hypertensive chronic kidney disease with stage 1 through stage 4 chronic kidney disease, or unspecified chronic kidney disease: Secondary | ICD-10-CM | POA: Diagnosis not present

## 2023-08-10 DIAGNOSIS — N1831 Chronic kidney disease, stage 3a: Secondary | ICD-10-CM | POA: Diagnosis not present

## 2023-08-10 DIAGNOSIS — R8290 Unspecified abnormal findings in urine: Secondary | ICD-10-CM | POA: Diagnosis not present

## 2023-08-11 DIAGNOSIS — Z96651 Presence of right artificial knee joint: Secondary | ICD-10-CM | POA: Diagnosis not present

## 2023-08-16 DIAGNOSIS — M25561 Pain in right knee: Secondary | ICD-10-CM | POA: Diagnosis not present

## 2023-08-16 DIAGNOSIS — Z96651 Presence of right artificial knee joint: Secondary | ICD-10-CM | POA: Diagnosis not present

## 2023-08-17 DIAGNOSIS — R808 Other proteinuria: Secondary | ICD-10-CM | POA: Diagnosis not present

## 2023-08-17 DIAGNOSIS — I129 Hypertensive chronic kidney disease with stage 1 through stage 4 chronic kidney disease, or unspecified chronic kidney disease: Secondary | ICD-10-CM | POA: Diagnosis not present

## 2023-08-17 DIAGNOSIS — N182 Chronic kidney disease, stage 2 (mild): Secondary | ICD-10-CM | POA: Diagnosis not present

## 2023-08-18 DIAGNOSIS — Z96651 Presence of right artificial knee joint: Secondary | ICD-10-CM | POA: Diagnosis not present

## 2023-08-18 DIAGNOSIS — M25561 Pain in right knee: Secondary | ICD-10-CM | POA: Diagnosis not present

## 2023-08-23 DIAGNOSIS — Z96651 Presence of right artificial knee joint: Secondary | ICD-10-CM | POA: Diagnosis not present

## 2023-09-08 DIAGNOSIS — Z961 Presence of intraocular lens: Secondary | ICD-10-CM | POA: Diagnosis not present

## 2023-09-08 DIAGNOSIS — H43813 Vitreous degeneration, bilateral: Secondary | ICD-10-CM | POA: Diagnosis not present

## 2023-09-08 DIAGNOSIS — H26493 Other secondary cataract, bilateral: Secondary | ICD-10-CM | POA: Diagnosis not present

## 2023-09-08 DIAGNOSIS — Z0100 Encounter for examination of eyes and vision without abnormal findings: Secondary | ICD-10-CM | POA: Diagnosis not present

## 2023-09-08 DIAGNOSIS — H18513 Endothelial corneal dystrophy, bilateral: Secondary | ICD-10-CM | POA: Diagnosis not present

## 2023-09-15 ENCOUNTER — Encounter: Payer: Self-pay | Admitting: Internal Medicine

## 2023-09-15 ENCOUNTER — Ambulatory Visit: Payer: HMO | Admitting: Internal Medicine

## 2023-09-15 VITALS — BP 118/70 | HR 68 | Temp 97.6°F | Ht 67.0 in | Wt 130.6 lb

## 2023-09-15 DIAGNOSIS — K5900 Constipation, unspecified: Secondary | ICD-10-CM

## 2023-09-15 DIAGNOSIS — R0789 Other chest pain: Secondary | ICD-10-CM | POA: Diagnosis not present

## 2023-09-15 DIAGNOSIS — F5101 Primary insomnia: Secondary | ICD-10-CM

## 2023-09-15 DIAGNOSIS — Z23 Encounter for immunization: Secondary | ICD-10-CM | POA: Diagnosis not present

## 2023-09-15 DIAGNOSIS — Z96651 Presence of right artificial knee joint: Secondary | ICD-10-CM | POA: Diagnosis not present

## 2023-09-15 DIAGNOSIS — N182 Chronic kidney disease, stage 2 (mild): Secondary | ICD-10-CM

## 2023-09-15 DIAGNOSIS — I700 Atherosclerosis of aorta: Secondary | ICD-10-CM | POA: Diagnosis not present

## 2023-09-15 DIAGNOSIS — E7849 Other hyperlipidemia: Secondary | ICD-10-CM | POA: Diagnosis not present

## 2023-09-15 DIAGNOSIS — R55 Syncope and collapse: Secondary | ICD-10-CM

## 2023-09-15 DIAGNOSIS — E039 Hypothyroidism, unspecified: Secondary | ICD-10-CM | POA: Diagnosis not present

## 2023-09-15 DIAGNOSIS — R7303 Prediabetes: Secondary | ICD-10-CM

## 2023-09-15 LAB — LIPID PANEL
Cholesterol: 171 mg/dL (ref 0–200)
HDL: 60.6 mg/dL (ref >39.00)
LDL Cholesterol: 86 mg/dL (ref 0–99)
NonHDL: 110.81
Total CHOL/HDL Ratio: 3
Triglycerides: 125 mg/dL (ref 0.0–149.0)
VLDL: 25 mg/dL (ref 0.0–40.0)

## 2023-09-15 LAB — COMPREHENSIVE METABOLIC PANEL
ALT: 16 U/L (ref 0–35)
AST: 21 U/L (ref 0–37)
Albumin: 4.3 g/dL (ref 3.5–5.2)
Alkaline Phosphatase: 40 U/L (ref 39–117)
BUN: 14 mg/dL (ref 6–23)
CO2: 33 meq/L — ABNORMAL HIGH (ref 19–32)
Calcium: 9.8 mg/dL (ref 8.4–10.5)
Chloride: 101 meq/L (ref 96–112)
Creatinine, Ser: 0.93 mg/dL (ref 0.40–1.20)
GFR: 57.32 mL/min — ABNORMAL LOW (ref >60.00)
Glucose, Bld: 93 mg/dL (ref 70–99)
Potassium: 4.4 meq/L (ref 3.5–5.1)
Sodium: 140 meq/L (ref 135–145)
Total Bilirubin: 0.4 mg/dL (ref 0.2–1.2)
Total Protein: 7.3 g/dL (ref 6.0–8.3)

## 2023-09-15 LAB — TSH: TSH: 1.72 u[IU]/mL (ref 0.35–5.50)

## 2023-09-15 LAB — LDL CHOLESTEROL, DIRECT: Direct LDL: 84 mg/dL

## 2023-09-15 LAB — HEMOGLOBIN A1C: Hgb A1c MFr Bld: 5.8 % (ref 4.6–6.5)

## 2023-09-15 MED ORDER — LOSARTAN POTASSIUM 25 MG PO TABS: 25.0000 mg | ORAL_TABLET | Freq: Every day | ORAL | 1 refills | Status: DC

## 2023-09-15 NOTE — Assessment & Plan Note (Signed)
Reviewed findings of prior CT scan today and reminded her of the reason for taking high potency statin therapy

## 2023-09-15 NOTE — Assessment & Plan Note (Signed)
She is taking  low dose imdur . She has had no subsequent episodes.

## 2023-09-15 NOTE — Patient Instructions (Signed)
You have Proteinuria.  protein in the urine means that  your kidney's ability to  filter protein out of the urine  is decreasing because of your kidney disease . Even though your blood pressure is at target of 120/70 or less, I recommend starting a medication called losartan. The losartan is MUCH WEAKER than telmisartan and can  prevent the proteinuria from getting worse .  YOu can take it at bedtime   I do recommend the RSV vaccine (at your pharmacy)   Return in  months

## 2023-09-15 NOTE — Assessment & Plan Note (Signed)
Managed with  melatonin after dinner and qhs trazodone prn

## 2023-09-15 NOTE — Assessment & Plan Note (Signed)
She has recovered fully and is pleased with the improved mobility

## 2023-09-15 NOTE — Assessment & Plan Note (Signed)
Her  a1c had risen slightly in spite of adherence to low glycemic index diet.  Encouraged to continue healthy lifestyle .  Repeating today and then every 6 months Lab Results  Component Value Date   HGBA1C 6.0 06/15/2023

## 2023-09-15 NOTE — Progress Notes (Signed)
Subjective:  Patient ID: [name], female    DOB: 26-Mar-1941  Age: 82 y.o. MRN: [id number]  CC: The primary encounter diagnosis was Need for influenza vaccination. Diagnoses of Familial hyperlipidemia, Prediabetes, Atypical chest pain, Acquired hypothyroidism, Abdominal aortic atherosclerosis (HCC), Syncope and collapse, S/P TKR (total knee replacement) using cement, right, Primary insomnia, CKD (chronic kidney disease), stage II, and Constipation, unspecified constipation type were also pertinent to this visit.   HPI ELLORA PITTILLO presents for  Chief Complaint  Patient presents with   Medical Management of Chronic Issues    1) S/p Right TKR by orthopediist [facility] August  19,  ,  PT completed .  doing very well   2) CKD with proteinuria :  saw Singh in [place] . Has not taken telmisartan since December due to h/o syncope and hypotension resulting in a fall .  Discussed need for ARB at a lower dose     Outpatient Medications Prior to Visit  Medication Sig Dispense Refill   acetaminophen (TYLENOL) 500 MG tablet Take 2 tablets (1,000 mg total) by mouth every 8 (eight) hours. 30 tablet 0   aspirin EC 81 MG tablet Take 81 mg by mouth daily.     cholecalciferol (VITAMIN D) 1000 UNITS tablet Take 1,000 Units by mouth daily.     Cranberry 450 MG CAPS Take 900 mg by mouth daily. Urinary health supplement     diltiazem (CARDIZEM CD) 120 MG 24 hr capsule Take 1 capsule (120 mg total) by mouth daily. (Patient taking differently: Take 120 mg by mouth at bedtime.) 90 capsule 3   diphenhydramine-acetaminophen (TYLENOL PM) 25-500 MG TABS tablet Take 1 tablet by mouth at bedtime as needed.     docusate sodium (COLACE) 100 MG capsule Take 1 capsule (100 mg total) by mouth 2 (two) times daily. 10 capsule 0   glucosamine-chondroitin 500-400 MG tablet Take 1 tablet by mouth daily.      isosorbide mononitrate (IMDUR) 30 MG 24 hr tablet TAKE 1 TABLET BY MOUTH EVERY DAY (Patient taking  differently: Take 30 mg by mouth at bedtime.) 90 tablet 3   levothyroxine (SYNTHROID) 50 MCG tablet TAKE 1 TABLET BY MOUTH  DAILY 100 tablet 2   Multiple Vitamins-Minerals (MULTIVITAMIN WITH MINERALS) tablet Take 1 tablet by mouth daily.     Omega-3 Fatty Acids (FISH OIL) 1000 MG CPDR Take 1,200 mg by mouth daily.     pantoprazole (PROTONIX) 20 MG tablet TAKE 1 TABLET BY MOUTH EVERY DAY 90 tablet 2   rosuvastatin (CRESTOR) 10 MG tablet Take 1 tablet (10 mg total) by mouth daily. (Patient taking differently: Take 10 mg by mouth at bedtime.) 100 tablet 2   traZODone (DESYREL) 50 MG tablet TAKE 1/2 TO 1 TABLET BY MOUTH AT BEDTIME AS NEEDED FOR SLEEP 90 tablet 2   ondansetron (ZOFRAN) 4 MG tablet Take 1 tablet (4 mg total) by mouth every 6 (six) hours as needed for nausea. 20 tablet 0   traMADol (ULTRAM) 50 MG tablet Take 1 tablet (50 mg total) by mouth every 6 (six) hours as needed for moderate pain. 30 tablet 0   enoxaparin (LOVENOX) 40 MG/0.4ML injection Inject 0.4 mLs (40 mg total) into the skin daily for 14 days. 5.6 mL 0   No facility-administered medications prior to visit.    Review of Systems;  Patient denies headache, fevers, malaise, unintentional weight loss, skin rash, eye pain, sinus congestion and sinus pain, sore throat, dysphagia,  hemoptysis , cough, dyspnea,  wheezing, chest pain, palpitations, orthopnea, edema, abdominal pain, nausea, melena, diarrhea, constipation, flank pain, dysuria, hematuria, urinary  Frequency, nocturia, numbness, tingling, seizures,  Focal weakness, Loss of consciousness,  Tremor, insomnia, depression, anxiety, and suicidal ideation.      Objective:  BP 118/70   Pulse 68   Temp 97.6 F (36.4 C) (Oral)   Ht 5\' 7"  (1.702 m)   Wt 130 lb 9.6 oz (59.2 kg)   SpO2 97%   BMI 20.45 kg/m   BP Readings from Last 3 Encounters:  09/15/23 118/70  07/12/23 (!) 138/56  07/04/23 134/67    Wt Readings from Last 3 Encounters:  09/15/23 130 lb 9.6 oz (59.2 kg)   07/11/23 134 lb (60.8 kg)  07/04/23 134 lb (60.8 kg)    Physical Exam Vitals reviewed.  Constitutional:      General: She is not in acute distress.    Appearance: Normal appearance. She is normal weight. She is not ill-appearing, toxic-appearing or diaphoretic.  HENT:     Head: Normocephalic.  Eyes:     General: No scleral icterus.       Right eye: No discharge.        Left eye: No discharge.     Conjunctiva/sclera: Conjunctivae normal.  Cardiovascular:     Rate and Rhythm: Normal rate and regular rhythm.     Heart sounds: Normal heart sounds.  Pulmonary:     Effort: Pulmonary effort is normal. No respiratory distress.     Breath sounds: Normal breath sounds.  Musculoskeletal:        General: Normal range of motion.  Skin:    General: Skin is warm and dry.  Neurological:     General: No focal deficit present.     Mental Status: She is alert and oriented to person, place, and time. Mental status is at baseline.  Psychiatric:        Mood and Affect: Mood normal.        Behavior: Behavior normal.        Thought Content: Thought content normal.        Judgment: Judgment normal.    Lab Results  Component Value Date   HGBA1C 5.8 09/15/2023   HGBA1C 6.0 06/15/2023   HGBA1C 6.2 12/02/2022    Lab Results  Component Value Date   CREATININE 0.93 09/15/2023   CREATININE 0.77 07/12/2023   CREATININE 0.91 06/15/2023    Lab Results  Component Value Date   WBC 12.2 (H) 07/12/2023   HGB 12.0 07/12/2023   HCT 33.8 (L) 07/12/2023   PLT 227 07/12/2023   GLUCOSE 93 09/15/2023   CHOL 171 09/15/2023   TRIG 125.0 09/15/2023   HDL 60.60 09/15/2023   LDLDIRECT 84.0 09/15/2023   LDLCALC 86 09/15/2023   ALT 16 09/15/2023   AST 21 09/15/2023   NA 140 09/15/2023   K 4.4 09/15/2023   CL 101 09/15/2023   CREATININE 0.93 09/15/2023   BUN 14 09/15/2023   CO2 33 (H) 09/15/2023   TSH 1.72 09/15/2023   HGBA1C 5.8 09/15/2023   MICROALBUR 1.3 12/02/2022    DG Knee Right  Port  Result Date: 07/11/2023 CLINICAL DATA:  Status post knee replacement. EXAM: PORTABLE RIGHT KNEE - 1-2 VIEW COMPARISON:  None Available. FINDINGS: Right knee arthroplasty in expected alignment. No periprosthetic lucency or fracture. There has been patellar resurfacing. Recent postsurgical change includes air and edema in the soft tissues and joint space. IMPRESSION: Right knee arthroplasty without immediate postoperative complication. Electronically Signed  By: Narda Rutherford M.D.   On: 07/11/2023 16:27    Assessment & Plan:  .Need for influenza vaccination -     Flu Vaccine Trivalent High Dose (Fluad)  Familial hyperlipidemia -     Comprehensive metabolic panel -     Lipid panel -     LDL cholesterol, direct  Prediabetes Assessment & Plan: Her  a1c had risen slightly in spite of adherence to low glycemic index diet.  Encouraged to continue healthy lifestyle .  Repeating today and then every 6 months Lab Results  Component Value Date   HGBA1C 6.0 06/15/2023     Orders: -     Hemoglobin A1c -     Comprehensive metabolic panel  Atypical chest pain Assessment & Plan: She is taking  low dose imdur . She has had no subsequent episodes.    Acquired hypothyroidism -     TSH  Abdominal aortic atherosclerosis (HCC) Assessment & Plan: Reviewed findings of prior CT scan today and reminded her of the reason for taking high potency statin therapy    Syncope and collapse Assessment & Plan: Her recent episode was accompanied by prolonged  hypotensive episode  and no arrhythmias noted during ER evaluation.  BP has normalized without use of telmisartan, but she has proteinuria nd CKD .  Resuming 25 mg losartan qhs   S/P TKR (total knee replacement) using cement, right Assessment & Plan: She has recovered fully and is pleased with the improved mobility   Primary insomnia Assessment & Plan: Managed with  melatonin after dinner and qhs trazodone prn   CKD (chronic kidney  disease), stage II  Constipation, unspecified constipation type Assessment & Plan: With microalbuminuria.  Did nto tolerate telmisartan due to hypotension .  Starting losartan 25 m g  at bedtime  Lab Results  Component Value Date   MICROALBUR 1.3 12/02/2022   MICROALBUR <0.7 12/30/2021      Lab Results  Component Value Date   CREATININE 0.93 09/15/2023      Other orders -     Losartan Potassium; Take 1 tablet (25 mg total) by mouth at bedtime.  Dispense: 90 tablet; Refill: 1     I provided 30 minutes of face-to-face time during this encounter reviewing patient's last visit with me, patient's  most recent visit with orthopedics, and nrphrology ,   recent surgical and non surgical procedures, previous  labs and imaging studies, counseling on currently addressed issues,  and post visit ordering to diagnostics and therapeutics .   Follow-up: No follow-ups on file.   Sherlene Shams, MD

## 2023-09-15 NOTE — Assessment & Plan Note (Signed)
Her recent episode was accompanied by prolonged  hypotensive episode  and no arrhythmias noted during ER evaluation.  BP has normalized without use of telmisartan, but she has proteinuria nd CKD .  Resuming 25 mg losartan qhs

## 2023-09-17 DIAGNOSIS — N182 Chronic kidney disease, stage 2 (mild): Secondary | ICD-10-CM | POA: Insufficient documentation

## 2023-09-17 NOTE — Assessment & Plan Note (Signed)
With microalbuminuria.  Did nto tolerate telmisartan due to hypotension .  Starting losartan 25 m g  at bedtime  Lab Results  Component Value Date   MICROALBUR 1.3 12/02/2022   MICROALBUR <0.7 12/30/2021      Lab Results  Component Value Date   CREATININE 0.93 09/15/2023

## 2023-09-28 ENCOUNTER — Ambulatory Visit (INDEPENDENT_AMBULATORY_CARE_PROVIDER_SITE_OTHER): Payer: HMO | Admitting: *Deleted

## 2023-09-28 VITALS — BP 114/65 | HR 72 | Ht 67.0 in | Wt 130.0 lb

## 2023-09-28 DIAGNOSIS — Z780 Asymptomatic menopausal state: Secondary | ICD-10-CM

## 2023-09-28 DIAGNOSIS — Z0000 Encounter for general adult medical examination without abnormal findings: Secondary | ICD-10-CM | POA: Diagnosis not present

## 2023-09-28 NOTE — Progress Notes (Signed)
Subjective:   [name] is a 82 y.o. female who presents for Medicare Annual (Subsequent) preventive examination.  Visit Complete: Virtual I connected with  [name] on 09/28/23 by a audio enabled telemedicine application and verified that I am speaking with the correct person using two identifiers.  Patient Location: Home  Provider Location: Office/Clinic  I discussed the limitations of evaluation and management by telemedicine. The patient expressed understanding and agreed to proceed.  Vital Signs: Because this visit was a virtual/telehealth visit, some criteria may be missing or patient reported. Any vitals not documented were not able to be obtained and vitals that have been documented are patient reported. Patient provided some vital signs which has been documented.    Cardiac Risk Factors include: advanced age (>27men, >21 women);dyslipidemia;Other (see comment), Risk factor comments: PVC's     Objective:    Today's Vitals   09/28/23 1332  BP: 114/65  Pulse: 72  Weight: 130 lb (59 kg)  Height: 5\' 7"  (1.702 m)   Body mass index is 20.36 kg/m.     09/28/2023    1:50 PM 07/11/2023    7:45 PM 07/11/2023   11:43 AM 07/04/2023   10:43 AM 01/24/2023   11:01 AM 11/11/2022   11:54 AM 08/18/2022    2:52 PM  Advanced Directives  Does Patient Have a Medical Advance Directive? Yes Yes Yes Yes Yes Yes Yes  Type of Estate agent of [place];Living will Healthcare Power of [place];Living will Healthcare Power of [place];Living will Living will;Healthcare Power of [employer] Power of [place];Living will;Out of facility DNR (pink MOST or yellow form) Healthcare Power of [place];Living will Healthcare Power of [place];Living will  Does patient want to make changes to medical advance directive?  No - Patient declined No - Guardian declined No - Patient declined     Copy of Healthcare Power of Attorney in Chart? No - copy requested  No -  copy requested No - copy requested   No - copy requested    Current Medications (verified) Outpatient Encounter Medications as of 09/28/2023  Medication Sig   acetaminophen (TYLENOL) 500 MG tablet Take 2 tablets (1,000 mg total) by mouth every 8 (eight) hours.   aspirin EC 81 MG tablet Take 81 mg by mouth daily.   cholecalciferol (VITAMIN D) 1000 UNITS tablet Take 1,000 Units by mouth daily.   Cranberry 450 MG CAPS Take 900 mg by mouth daily. Urinary health supplement   diltiazem (CARDIZEM CD) 120 MG 24 hr capsule Take 1 capsule (120 mg total) by mouth daily. (Patient taking differently: Take 120 mg by mouth at bedtime.)   diphenhydramine-acetaminophen (TYLENOL PM) 25-500 MG TABS tablet Take 1 tablet by mouth at bedtime as needed.   glucosamine-chondroitin 500-400 MG tablet Take 1 tablet by mouth daily.    isosorbide mononitrate (IMDUR) 30 MG 24 hr tablet TAKE 1 TABLET BY MOUTH EVERY DAY (Patient taking differently: Take 30 mg by mouth at bedtime.)   levothyroxine (SYNTHROID) 50 MCG tablet TAKE 1 TABLET BY MOUTH  DAILY   losartan (COZAAR) 25 MG tablet Take 1 tablet (25 mg total) by mouth at bedtime.   Multiple Vitamins-Minerals (MULTIVITAMIN WITH MINERALS) tablet Take 1 tablet by mouth daily.   Omega-3 Fatty Acids (FISH OIL) 1000 MG CPDR Take 1,200 mg by mouth daily.   pantoprazole (PROTONIX) 20 MG tablet TAKE 1 TABLET BY MOUTH EVERY DAY   rosuvastatin (CRESTOR) 10 MG tablet Take 1 tablet (10 mg total) by mouth  daily. (Patient taking differently: Take 10 mg by mouth at bedtime.)   traZODone (DESYREL) 50 MG tablet TAKE 1/2 TO 1 TABLET BY MOUTH AT BEDTIME AS NEEDED FOR SLEEP   docusate sodium (COLACE) 100 MG capsule Take 1 capsule (100 mg total) by mouth 2 (two) times daily. (Patient not taking: Reported on 09/28/2023)   ondansetron (ZOFRAN) 4 MG tablet Take 1 tablet (4 mg total) by mouth every 6 (six) hours as needed for nausea. (Patient not taking: Reported on 09/28/2023)   [DISCONTINUED]  traMADol (ULTRAM) 50 MG tablet Take 1 tablet (50 mg total) by mouth every 6 (six) hours as needed for moderate pain. (Patient not taking: Reported on 09/28/2023)   No facility-administered encounter medications on file as of 09/28/2023.    Allergies (verified) Prednisone   History: Past Medical History:  Diagnosis Date   Abdominal aortic atherosclerosis (HCC)    Arthritis of both knees    Atypical chest pain    Basal cell carcinoma 10/23/2020   right lateral ankle EDC done 12/18/20   Basal cell carcinoma 05/13/2021   right pretibia, [facility] 07/22/21   Basal cell carcinoma 12/03/2021   left chest, [facility] 01/06/2022   Basal cell carcinoma (BCC) 05/13/2021   left forehead, Moh's 07/29/2021   Candida esophagitis (HCC) 06/30/2021   Complication of anesthesia    slow to wake up   E. coli UTI 09/10/2015   Resistant to cipro .  Keflex sent x 5 days    Elevated blood pressure reading in office with diagnosis of hypertension    GERD (gastroesophageal reflux disease)    History of dysplastic nevus 05/13/2021   right dorsal hand, mild   Hyperlipidemia    Hypothyroidism    Insomnia    a.) uses trazodone PRN   Laceration of head without foreign body 11/18/2022   Long term current use of aspirin    Peripheral neuropathy    Pre-diabetes    PVC's (premature ventricular contractions)    a.  48-hour Holter in 3/17 demonstrated NSR, frequent PVCs totaling 5300 beats representing 3% burden, occasional PACs, short runs of SVT   Raynaud's disease    Syncope and collapse    r/t bp meds   Vitamin D deficiency    Past Surgical History:  Procedure Laterality Date   ABDOMINAL HYSTERECTOMY     In 30's   BROW LIFT Bilateral 07/12/2017   Procedure: BLEPHAROPLASTY upper eyelid with excess skin;  Surgeon: Imagene Riches, MD;  Location: [facility];  Service: Ophthalmology;  Laterality: Bilateral;  MAC   CATARACT EXTRACTION, BILATERAL     COLONOSCOPY WITH PROPOFOL N/A 12/29/2015   Procedure: COLONOSCOPY  WITH PROPOFOL;  Surgeon: Wallace Cullens, MD;  Location[facility];  Service: Gastroenterology;  Laterality: N/A;   COLONOSCOPY WITH PROPOFOL N/A 12/03/2019   Procedure: COLONOSCOPY WITH BIOPSY;  Surgeon: Midge Minium, MD;  Location: [facility];  Service: Endoscopy;  Laterality: N/A;   ESOPHAGOGASTRODUODENOSCOPY (EGD) WITH PROPOFOL N/A 06/25/2021   Procedure: ESOPHAGOGASTRODUODENOSCOPY (EGD) WITH PROPOFOL;  Surgeon: Midge Minium, MD;  Location: [facility];  Service: Endoscopy;  Laterality: N/A;   POLYPECTOMY N/A 12/03/2019   Procedure: POLYPECTOMY;  Surgeon: Midge Minium, MD;  Location: [facility];  Service: Endoscopy;  Laterality: N/A;   PTOSIS REPAIR Bilateral 07/12/2017   Procedure: ptosis repair resect ex;  Surgeon: Imagene Riches, MD;  Location: [facility];  Service: Ophthalmology;  Laterality: Bilateral;  Requests early   TOTAL KNEE ARTHROPLASTY Right 07/11/2023  Procedure: TOTAL KNEE ARTHROPLASTY;  Surgeon: Reinaldo Berber, MD;  Location: [facility] ORS;  Service: Orthopedics;  Laterality: Right;   VAGINAL PROLAPSE REPAIR  2014   Dr. Greggory Keen   Family History  Problem Relation Age of Onset   Breast cancer Mother        32's   Heart attack Mother    Diabetes Mother    Stroke Father    Diabetes Father    Pancreatic cancer Sister    Cancer Sister        pancreatic   Cancer Sister        anal   Breast cancer Sister    Breast cancer Sister    Cancer Brother        liver   Diabetes Brother    Heart attack Brother    Diabetes Brother    Cancer Brother        stomach   Cancer Brother    Diabetes Brother    Colon cancer Neg Hx    Social History   Socioeconomic History   Marital status: Widowed    Spouse name: Not on file   Number of children: 2   Years of education: Not on file   Highest education level: Not on file  Occupational History   Occupation: Retired Engineer, site  Tobacco Use   Smoking status: Former    Types: Cigarettes     Passive exposure: Past   Smokeless tobacco: Never   Tobacco comments:    Smoked x 1 year in her 20's  Vaping Use   Vaping status: Never Used  Substance and Sexual Activity   Alcohol use: No   Drug use: No   Sexual activity: Never  Other Topics Concern   Not on file  Social History Narrative   Daily Caffeine Use:  One cup coffee in am Regular Exercise -  5 days a week x 1 hour - gym, aerobic and wt training      Left Handed    Lives in a two story home but stays on first floor.    Social Determinants of Health   Financial Resource Strain: Low Risk  (09/28/2023)   Overall Financial Resource Strain (CARDIA)    Difficulty of Paying Living Expenses: Not hard at all  Food Insecurity: No Food Insecurity (09/28/2023)   Hunger Vital Sign    Worried About Running Out of Food in the Last Year: Never true    Ran Out of Food in the Last Year: Never true  Transportation Needs: No Transportation Needs (09/28/2023)   PRAPARE - Administrator, Civil Service (Medical): No    Lack of Transportation (Non-Medical): No  Physical Activity: Sufficiently Active (09/28/2023)   Exercise Vital Sign    Days of Exercise per Week: 4 days    Minutes of Exercise per Session: 60 min  Stress: No Stress Concern Present (09/28/2023)   [employer] of Occupational Health - Occupational Stress Questionnaire    Feeling of Stress : Not at all  Social Connections: Moderately Integrated (09/28/2023)   Social Connection and Isolation Panel [NHANES]    Frequency of Communication with Friends and Family: More than three times a week    Frequency of Social Gatherings with Friends and Family: More than three times a week    Attends Religious Services: More than 4 times per year    Active Member of [employer] or Organizations: Yes    Attends Banker Meetings: More than 4 times per year  Marital Status: Widowed    Tobacco Counseling Counseling given: Not Answered Tobacco comments: Smoked x 1 year  in her 20's   Clinical Intake:  Pre-visit preparation completed: Yes  Pain : No/denies pain     BMI - recorded: 20.36 Nutritional Status: BMI of 19-24  Normal Nutritional Risks: None Diabetes: No  How often do you need to have someone help you when you read instructions, pamphlets, or other written materials from your doctor or pharmacy?: 1 - Never  Interpreter Needed?: No  Information entered by :: R. Mattie Nordell LPN   Activities of Daily Living    09/28/2023    1:35 PM 07/11/2023    7:55 PM  In your present state of health, do you have any difficulty performing the following activities:  Hearing? 1   Comment wears aids   Vision? 0   Comment glasses   Difficulty concentrating or making decisions? 0   Walking or climbing stairs? 0   Dressing or bathing? 0   Doing errands, shopping? 0 0  Preparing Food and eating ? N   Using the Toilet? N   In the past six months, have you accidently leaked urine? N   Comment wears pads   Do you have problems with loss of bowel control? N   Managing your Medications? N   Managing your Finances? N   Housekeeping or managing your Housekeeping? N     Patient Care Team: Sherlene Shams, MD as PCP - General (Internal Medicine) Antonieta Iba, MD as PCP - Cardiology (Cardiology) Glendale Chard, DO as Consulting Physician (Neurology)  Indicate any recent Medical Services you may have received from other than [facility] providers in the past year (date may be approximate).     Assessment:   This is a routine wellness examination for [name].  Hearing/Vision screen Hearing Screening - Comments:: Wears aids Vision Screening - Comments:: glasses   Goals Addressed             This Visit's Progress    Patient Stated       Wants to continue her exercise program and start some classes       Depression Screen    09/28/2023    1:43 PM 06/15/2023    9:39 AM 03/07/2023    2:16 PM 12/02/2022    8:46 AM 11/18/2022    8:22 AM 08/18/2022     2:51 PM 06/30/2022   10:37 AM  PHQ 2/9 Scores  PHQ - 2 Score 0 0 0 0 0 0 0  PHQ- 9 Score 0   0 0      Fall Risk    09/28/2023    1:38 PM 09/15/2023    8:31 AM 06/15/2023    9:39 AM 03/07/2023    2:16 PM 01/24/2023   11:00 AM  Fall Risk   Falls in the past year? 0 0 1 1 1   Number falls in past yr: 0 0 0 0 1  Injury with Fall? 0 0 0 1 1  Risk for fall due to : No Fall Risks No Fall Risks History of fall(s) No Fall Risks   Follow up Falls prevention discussed;Falls evaluation completed Falls evaluation completed Falls evaluation completed Falls evaluation completed Falls evaluation completed    MEDICARE RISK AT HOME: Medicare Risk at Home Any stairs in or around the home?: Yes If so, are there any without handrails?: No Home free of loose throw rugs in walkways, pet beds, electrical cords, etc?: Yes Adequate  lighting in your home to reduce risk of falls?: Yes Life alert?: No Use of a cane, walker or w/c?: No Grab bars in the bathroom?: Yes Shower chair or bench in shower?: Yes Elevated toilet seat or a handicapped toilet?: Yes      Cognitive Function:    07/31/2018   10:37 AM 07/29/2017   10:21 AM 07/27/2016   10:53 AM 07/25/2015   10:45 AM  MMSE - Mini Mental State Exam  Orientation to time 5 5 5 5   Orientation to Place 5 5 5 5   Registration 3 3 3 3   Attention/ Calculation 5 5 5 5   Recall 3 3 3 3   Language- name 2 objects 2 2 2 2   Language- repeat 1 1 1 1   Language- follow 3 step command 3 3 3 3   Language- read & follow direction 1 1 1 1   Write a sentence 1 1 1 1   Copy design 1 1 1 1   Total score 30 30 30 30         09/28/2023    1:50 PM 08/18/2022    2:55 PM 08/10/2019   12:54 PM  6CIT Screen  What Year? 0 points 0 points 0 points  What month? 0 points 0 points 0 points  What time? 0 points 0 points 0 points  Count back from 20 0 points 0 points 0 points  Months in reverse 0 points 0 points 0 points  Repeat phrase 0 points 0 points 0 points  Total Score 0 points 0  points 0 points    Immunizations Immunization History  Administered Date(s) Administered   Fluad Quad(high Dose 65+) 08/23/2022   Fluad Trivalent(High Dose 65+) 09/15/2023   Influenza Split 08/18/2011, 08/04/2012   Influenza Whole 09/10/2013   Influenza, High Dose Seasonal PF 07/25/2015, 07/27/2016, 07/29/2017, 07/31/2018, 08/09/2019, 09/23/2021   Influenza,inj,Quad PF,6+ Mos 09/06/2014   Influenza,inj,quad, With Preservative 07/25/2018   Influenza-Unspecified 08/24/2020   PFIZER Comirnaty(Gray Top)Covid-19 Tri-Sucrose Vaccine 08/23/2022   PFIZER(Purple Top)SARS-COV-2 Vaccination 12/25/2019, 01/15/2020, 09/25/2020   Pfizer Covid-19 Vaccine Bivalent Booster 17yrs & up 09/23/2021   Pneumococcal Conjugate-13 12/07/2013   Pneumococcal Polysaccharide-23 12/18/2007, 03/06/2018   Tdap 11/20/2012, 09/29/2016, 11/11/2022   Zoster Recombinant(Shingrix) 09/24/2018, 12/06/2018   Zoster, Live 12/18/2007    TDAP status: Up to date  Flu Vaccine status: Up to date  Pneumococcal vaccine status: Up to date  Covid-19 vaccine status: Information provided on how to obtain vaccines.   Qualifies for Shingles Vaccine? Yes   Zostavax completed Yes   Shingrix Completed?: Yes  Screening Tests Health Maintenance  Topic Date Due   Medicare Annual Wellness (AWV)  08/19/2023   COVID-19 Vaccine (6 - 2023-24 season) 09/30/2023 (Originally 07/24/2023)   MAMMOGRAM  04/11/2024   DTaP/Tdap/Td (4 - Td or Tdap) 11/11/2032   Pneumonia Vaccine 94+ Years old  Completed   INFLUENZA VACCINE  Completed   DEXA SCAN  Completed   Zoster Vaccines- Shingrix  Completed   HPV VACCINES  Aged Out   Hepatitis C Screening  Discontinued    Health Maintenance  Health Maintenance Due  Topic Date Due   Medicare Annual Wellness (AWV)  08/19/2023    Colorectal cancer screening: No longer required.   Mammogram status: Completed 03/2023. Repeat every year  Bone Density status: Ordered 09/28/2023. Pt provided with contact  info and advised to call to schedule appt.  Lung Cancer Screening: (Low Dose CT Chest recommended if Age 24-80 years, 20 pack-year currently smoking OR have quit w/in 15years.) does  not qualify.     Additional Screening:  Hepatitis C Screening: does not qualify; Completed 08/2020  Vision Screening: Recommended annual ophthalmology exams for early detection of glaucoma and other disorders of the eye. Is the patient up to date with their annual eye exam?  Yes  Who is the provider or what is the name of the office in which the patient attends annual eye exams? [facility] If pt is not established with a provider, would they like to be referred to a provider to establish care? No .   Dental Screening: Recommended annual dental exams for proper oral hygiene   Community Resource Referral / Chronic Care Management: CRR required this visit?  No   CCM required this visit?  No     Plan:     I have personally reviewed and noted the following in the patient's chart:   Medical and social history Use of alcohol, tobacco or illicit drugs  Current medications and supplements including opioid prescriptions. Patient is not currently taking opioid prescriptions. Functional ability and status Nutritional status Physical activity Advanced directives List of other physicians Hospitalizations, surgeries, and ER visits in previous 12 months Vitals Screenings to include cognitive, depression, and falls Referrals and appointments  In addition, I have reviewed and discussed with patient certain preventive protocols, quality metrics, and best practice recommendations. A written personalized care plan for preventive services as well as general preventive health recommendations were provided to patient.     Sydell Axon, LPN   56/12/1306   After Visit Summary: (MyChart) Due to this being a telephonic visit, the after visit summary with patients personalized plan was offered to patient via MyChart    Nurse Notes: None

## 2023-09-28 NOTE — Patient Instructions (Signed)
Ms. Britz , Thank you for taking time to come for your Medicare Wellness Visit. I appreciate your ongoing commitment to your health goals. Please review the following plan we discussed and let me know if I can assist you in the future.   Referrals/Orders/Follow-Ups/Clinician Recommendations: Bone Density has been ordered You have an order for:  []   2D Mammogram  []   3D Mammogram  [x]   Bone Density     Please call for appointment:  [facility]  [address] [place] [address] [phone]  Make sure to wear two-piece clothing.  No lotions, powders, or deodorants the day of the appointment. Make sure to bring picture ID and insurance card.  Bring list of medications you are currently taking including any supplements.   Schedule your [place] screening mammogram through MyChart!   Log into your MyChart account.  Go to 'Visit' (or 'Appointments' if on mobile App) --> Schedule an Appointment  Under 'Select a Reason for Visit' choose the Mammogram Screening option.  Complete the pre-visit questions and select the time and place that best fits your schedule.    This is a list of the screening recommended for you and due dates:  Health Maintenance  Topic Date Due   COVID-19 Vaccine (6 - 2023-24 season) 09/30/2023*   Mammogram  04/11/2024   Medicare Annual Wellness Visit  09/27/2024   DTaP/Tdap/Td vaccine (4 - Td or Tdap) 11/11/2032   Pneumonia Vaccine  Completed   Flu Shot  Completed   DEXA scan (bone density measurement)  Completed   Zoster (Shingles) Vaccine  Completed   HPV Vaccine  Aged Out   Hepatitis C Screening  Discontinued  *Topic was postponed. The date shown is not the original due date.    Advanced directives: (Copy Requested) Please bring a copy of your health care power of attorney and living will to the office to be added to your chart at your convenience.  Next Medicare Annual Wellness Visit  scheduled for next year: Yes 10/03/24 @ 2:05

## 2023-10-27 ENCOUNTER — Ambulatory Visit
Admission: RE | Admit: 2023-10-27 | Discharge: 2023-10-27 | Disposition: A | Discharge status: Home / Self Care | Payer: HMO | Source: Ambulatory Visit | Attending: Internal Medicine | Admitting: Internal Medicine

## 2023-10-27 DIAGNOSIS — Z780 Asymptomatic menopausal state: Secondary | ICD-10-CM | POA: Insufficient documentation

## 2023-10-27 DIAGNOSIS — M85831 Other specified disorders of bone density and structure, right forearm: Secondary | ICD-10-CM | POA: Diagnosis not present

## 2023-10-29 ENCOUNTER — Other Ambulatory Visit: Payer: Self-pay | Admitting: Internal Medicine

## 2023-10-29 ENCOUNTER — Other Ambulatory Visit: Payer: Self-pay | Admitting: Cardiovascular Disease

## 2023-10-31 NOTE — Telephone Encounter (Signed)
last visit: 12/28/22 with plan to f/u in 12 months.  Next visit: none/active recall

## 2023-11-28 ENCOUNTER — Encounter: Payer: Self-pay | Admitting: Dermatology

## 2023-11-28 ENCOUNTER — Ambulatory Visit: Payer: HMO | Admitting: Dermatology

## 2023-11-28 DIAGNOSIS — L820 Inflamed seborrheic keratosis: Secondary | ICD-10-CM | POA: Diagnosis not present

## 2023-11-28 DIAGNOSIS — D1801 Hemangioma of skin and subcutaneous tissue: Secondary | ICD-10-CM | POA: Diagnosis not present

## 2023-11-28 DIAGNOSIS — Z1283 Encounter for screening for malignant neoplasm of skin: Secondary | ICD-10-CM

## 2023-11-28 DIAGNOSIS — Z85828 Personal history of other malignant neoplasm of skin: Secondary | ICD-10-CM | POA: Diagnosis not present

## 2023-11-28 DIAGNOSIS — L578 Other skin changes due to chronic exposure to nonionizing radiation: Secondary | ICD-10-CM | POA: Diagnosis not present

## 2023-11-28 DIAGNOSIS — W908XXA Exposure to other nonionizing radiation, initial encounter: Secondary | ICD-10-CM | POA: Diagnosis not present

## 2023-11-28 DIAGNOSIS — D692 Other nonthrombocytopenic purpura: Secondary | ICD-10-CM

## 2023-11-28 DIAGNOSIS — L814 Other melanin hyperpigmentation: Secondary | ICD-10-CM

## 2023-11-28 DIAGNOSIS — L821 Other seborrheic keratosis: Secondary | ICD-10-CM | POA: Diagnosis not present

## 2023-11-28 DIAGNOSIS — Z86018 Personal history of other benign neoplasm: Secondary | ICD-10-CM | POA: Diagnosis not present

## 2023-11-28 DIAGNOSIS — D229 Melanocytic nevi, unspecified: Secondary | ICD-10-CM

## 2023-11-28 NOTE — Patient Instructions (Signed)
 Cryotherapy Aftercare  Wash gently with soap and water everyday.   Apply Vaseline and Band-Aid daily until healed.   Recommend daily broad spectrum sunscreen SPF 30+ to sun-exposed areas, reapply every 2 hours as needed. Call for new or changing lesions.  Staying in the shade or wearing long sleeves, sun glasses (UVA+UVB protection) and wide brim hats (4-inch brim around the entire circumference of the hat) are also recommended for sun protection.    Melanoma ABCDEs  Melanoma is the most dangerous type of skin cancer, and is the leading cause of death from skin disease.  You are more likely to develop melanoma if you: Have light-colored skin, light-colored eyes, or red or blond hair Spend a lot of time in the sun Tan regularly, either outdoors or in a tanning bed Have had blistering sunburns, especially during childhood Have a close family member who has had a melanoma Have atypical moles or large birthmarks  Early detection of melanoma is key since treatment is typically straightforward and cure rates are extremely high if we catch it early.   The first sign of melanoma is often a change in a mole or a new dark spot.  The ABCDE system is a way of remembering the signs of melanoma.  A for asymmetry:  The two halves do not match. B for border:  The edges of the growth are irregular. C for color:  A mixture of colors are present instead of an even brown color. D for diameter:  Melanomas are usually (but not always) greater than 6mm - the size of a pencil eraser. E for evolution:  The spot keeps changing in size, shape, and color.  Please check your skin once per month between visits. You can use a small mirror in front and a large mirror behind you to keep an eye on the back side or your body.   If you see any new or changing lesions before your next follow-up, please call to schedule a visit.  Please continue daily skin protection including broad spectrum sunscreen SPF 30+ to  sun-exposed areas, reapplying every 2 hours as needed when you're outdoors.   Staying in the shade or wearing long sleeves, sun glasses (UVA+UVB protection) and wide brim hats (4-inch brim around the entire circumference of the hat) are also recommended for sun protection.     Due to recent changes in healthcare laws, you may see results of your pathology and/or laboratory studies on MyChart before the doctors have had a chance to review them. We understand that in some cases there may be results that are confusing or concerning to you. Please understand that not all results are received at the same time and often the doctors may need to interpret multiple results in order to provide you with the best plan of care or course of treatment. Therefore, we ask that you please give us  2 business days to thoroughly review all your results before contacting the office for clarification. Should we see a critical lab result, you will be contacted sooner.   If You Need Anything After Your Visit  If you have any questions or concerns for your doctor, please call our main line at [phone] and press option 4 to reach your doctor's medical assistant. If no one answers, please leave a voicemail as directed and we will return your call as soon as possible. Messages left after 4 pm will be answered the following business day.   You may also send us  a message via  MyChart. We typically respond to MyChart messages within 1-2 business days.  For prescription refills, please ask your pharmacy to contact our office. Our fax number is [phone].  If you have an urgent issue when the clinic is closed that cannot wait until the next business day, you can page your doctor at the number below.    Please note that while we do our best to be available for urgent issues outside of office hours, we are not available 24/7.   If you have an urgent issue and are unable to reach us , you may choose to seek medical care at  your doctor's office, retail clinic, urgent care center, or emergency room.  If you have a medical emergency, please immediately call 911 or go to the emergency department.  Pager Numbers  - Dr. Hester: [phone]  - Dr. Jackquline: [phone]  - Dr. Claudene: [phone]   In the event of inclement weather, please call our main line at [phone] for an update on the status of any delays or closures.  Dermatology Medication Tips: Please keep the boxes that topical medications come in in order to help keep track of the instructions about where and how to use these. Pharmacies typically print the medication instructions only on the boxes and not directly on the medication tubes.   If your medication is too expensive, please contact our office at [phone] option 4 or send us  a message through MyChart.   We are unable to tell what your co-pay for medications will be in advance as this is different depending on your insurance coverage. However, we may be able to find a substitute medication at lower cost or fill out paperwork to get insurance to cover a needed medication.   If a prior authorization is required to get your medication covered by your insurance company, please allow us  1-2 business days to complete this process.  Drug prices often vary depending on where the prescription is filled and some pharmacies may offer cheaper prices.  The website www.goodrx.com contains coupons for medications through different pharmacies. The prices here do not account for what the cost may be with help from insurance (it may be cheaper with your insurance), but the website can give you the [place] if you did not use any insurance.  - You can print the associated coupon and take it with your prescription to the pharmacy.  - You may also stop by our office during regular business hours and pick up a GoodRx coupon card.  - If you need your prescription sent electronically to a different pharmacy,  notify our office through [facility] or by phone at [phone] option 4.     Si Usted Necesita Algo Despus de Su Visita  Tambin puede enviarnos un mensaje a travs de Clinical cytogeneticist. Por lo general respondemos a los mensajes de MyChart en el transcurso de 1 a 2 das hbiles.  Para renovar recetas, por favor pida a su farmacia que se ponga en contacto con nuestra oficina. Randi lakes de fax es [place] [phone].  Si tiene un asunto urgente cuando la clnica est cerrada y que no puede esperar hasta el siguiente da hbil, puede llamar/localizar a su doctor(a) al nmero que aparece a continuacin.   Por favor, tenga en cuenta que aunque hacemos todo lo posible para estar disponibles para asuntos urgentes fuera del horario de [place], no estamos disponibles las 24 horas del da, los 7 [address]  [address] de la [place].   Si tiene un problema urgente y  no puede comunicarse con nosotros, puede optar por buscar atencin mdica  en el consultorio de su doctor(a), en una clnica privada, en un centro de atencin urgente o en una sala de emergencias.  Si tiene Engineer, drilling, por favor llame inmediatamente al 911 o vaya a la sala de emergencias.  Nmeros de bper  - Dr. Hester: [phone]  - Dra. Jackquline: [phone]  - Dr. Claudene: [phone]   En caso de inclemencias del tiempo, por favor llame a landry capes principal al [phone] para una actualizacin sobre el [place] de cualquier retraso o cierre.  Consejos para la medicacin en dermatologa: Por favor, guarde las cajas en las que vienen los medicamentos de uso tpico para ayudarle a seguir las instrucciones sobre dnde y cmo usarlos. Las farmacias generalmente imprimen las instrucciones del medicamento slo en las cajas y no directamente en los tubos del [place].   Si su medicamento es muy caro, por favor, pngase en contacto con landry rieger llamando al [phone] y presione la opcin 4 o envenos un mensaje a travs de  Clinical cytogeneticist.   No podemos decirle cul ser su copago por los medicamentos por adelantado ya que esto es diferente dependiendo de la cobertura de su seguro. Sin embargo, es posible que podamos encontrar un medicamento sustituto a Audiological scientist un formulario para que el seguro cubra el medicamento que se considera necesario.   Si se requiere una autorizacin previa para que su compaa de seguros malta su medicamento, por favor permtanos de 1 a 2 das hbiles para completar este proceso.  Los precios de los medicamentos varan con frecuencia dependiendo del Environmental consultant de dnde se surte la receta y alguna farmacias pueden ofrecer precios ms baratos.  El sitio web www.goodrx.com tiene cupones para medicamentos de Health and safety inspector. Los precios aqu no tienen en cuenta lo que podra costar con la ayuda del seguro (puede ser ms barato con su seguro), pero el sitio web puede darle el precio si no utiliz Tourist information centre manager.  - Puede imprimir el cupn correspondiente y llevarlo con su receta a la farmacia.  - Tambin puede pasar por nuestra oficina durante el horario de atencin regular y Education officer, museum una tarjeta de cupones de GoodRx.  - Si necesita que su receta se enve electrnicamente a una farmacia diferente, informe a nuestra oficina a travs de MyChart de [place] o por telfono llamando al [phone] y presione la opcin 4.

## 2023-11-28 NOTE — Progress Notes (Signed)
 Follow-Up Visit   Subjective  [name] is a 83 y.o. female who presents for the following: Skin Cancer Screening and Full Body Skin Exam. Hx of BCCs. Hx of dysplastic nevus.  Spots of concern on right flank and left anterior thigh. Irritated at times. Bothersome.   The patient presents for Total-Body Skin Exam (TBSE) for skin cancer screening and mole check. The patient has spots, moles and lesions to be evaluated, some may be new or changing and the patient may have concern these could be cancer.    The following portions of the chart were reviewed this encounter and updated as appropriate: medications, allergies, medical history  Review of Systems:  No other skin or systemic complaints except as noted in HPI or Assessment and Plan.  Objective  Well appearing patient in no apparent distress; mood and affect are within normal limits.  A full examination was performed including scalp, head, eyes, ears, nose, lips, neck, chest, axillae, abdomen, back, buttocks, bilateral upper extremities, bilateral lower extremities, hands, feet, fingers, toes, fingernails, and toenails. All findings within normal limits unless otherwise noted below.   Relevant physical exam findings are noted in the Assessment and Plan.  Right Abdomen Lower x1, Left anterior thigh x1 (2) Erythematous keratotic or waxy stuck-on papule or plaque.  Assessment & Plan   HISTORY OF BASAL CELL CARCINOMA OF THE SKIN. Multiple sites, see history. - No evidence of recurrence today - Recommend regular full body skin exams - Recommend daily broad spectrum sunscreen SPF 30+ to sun-exposed areas, reapply every 2 hours as needed.  - Call if any new or changing lesions are noted between office visits  HISTORY OF DYSPLASTIC NEVUS. Right dorsal hand, mild. 05/13/2021. No evidence of recurrence today Recommend regular full body skin exams Recommend daily broad spectrum sunscreen SPF 30+ to sun-exposed areas, reapply every  2 hours as needed.  Call if any new or changing lesions are noted between office visits   SKIN CANCER SCREENING PERFORMED TODAY.  ACTINIC DAMAGE - Chronic condition, secondary to cumulative UV/sun exposure - diffuse scaly erythematous macules with underlying dyspigmentation - Recommend daily broad spectrum sunscreen SPF 30+ to sun-exposed areas, reapply every 2 hours as needed.  - Staying in the shade or wearing long sleeves, sun glasses (UVA+UVB protection) and wide brim hats (4-inch brim around the entire circumference of the hat) are also recommended for sun protection.  - Call for new or changing lesions.  LENTIGINES, SEBORRHEIC KERATOSES, HEMANGIOMAS - Benign normal skin lesions - Benign-appearing - Call for any changes  MELANOCYTIC NEVI - Tan-brown and/or pink-flesh-colored symmetric macules and papules - Benign appearing on exam today - Observation - Call clinic for new or changing moles - Recommend daily use of broad spectrum spf 30+ sunscreen to sun-exposed areas.   INFLAMED SEBORRHEIC KERATOSIS (2) Right Abdomen Lower x1, Left anterior thigh x1 (2) Symptomatic, irritating, patient would like treated. Destruction of lesion - Right Abdomen Lower x1, Left anterior thigh x1 (2) Complexity: simple   Destruction method: cryotherapy   Informed consent: discussed and consent obtained   Timeout:  patient name, date of birth, surgical site, and procedure verified Lesion destroyed using liquid nitrogen: Yes   Region frozen until ice ball extended beyond lesion: Yes   Cryo cycles: 1 or 2. Outcome: patient tolerated procedure well with no complications   Post-procedure details: wound care instructions given   Additional details:  Prior to procedure, discussed risks of blister formation, small wound, skin dyspigmentation, or rare scar  following cryotherapy. Recommend Vaseline ointment to treated areas while healing.   Return in about 1 year (around 11/27/2024) for TBSE, HxBCC,  HxDN.  I, Jill Parcell, CMA, am acting as scribe for Boneta Sharps, MD.   Documentation: I have reviewed the above documentation for accuracy and completeness, and I agree with the above.  Boneta Sharps, MD

## 2023-11-30 DIAGNOSIS — Z96651 Presence of right artificial knee joint: Secondary | ICD-10-CM | POA: Diagnosis not present

## 2023-12-15 ENCOUNTER — Other Ambulatory Visit: Payer: Self-pay | Admitting: Internal Medicine

## 2023-12-17 ENCOUNTER — Other Ambulatory Visit: Payer: Self-pay | Admitting: Cardiovascular Disease

## 2023-12-17 DIAGNOSIS — I493 Ventricular premature depolarization: Secondary | ICD-10-CM

## 2023-12-18 ENCOUNTER — Other Ambulatory Visit: Payer: Self-pay | Admitting: Cardiovascular Disease

## 2023-12-22 ENCOUNTER — Ambulatory Visit: Payer: Self-pay | Admitting: Internal Medicine

## 2023-12-22 DIAGNOSIS — N390 Urinary tract infection, site not specified: Secondary | ICD-10-CM

## 2023-12-22 DIAGNOSIS — R300 Dysuria: Secondary | ICD-10-CM

## 2023-12-22 NOTE — Telephone Encounter (Signed)
Copied from CRM [id number]. Topic: Clinical - Red Word Triage >> Dec 22, 2023  8:12 AM Larwance Sachs wrote: Red Word that prompted transfer to Nurse Triage: Patient called in regarding possibly UTI- frequent urination, uncomfortable when urinating, burning and painful  Chief Complaint: burning with urination Symptoms: burning and frequency Frequency: started two days ago Pertinent Negatives: Patient denies fever, flank pain Disposition: [] ED /[] Urgent Care (no appt availability in office) / [] Appointment(In office/virtual)/ []  [place] [facility]/ [] Home Care/ [] Refused Recommended Disposition /[] [facility]/ [x]  Follow-up with PCP Additional Notes: Symptoms started two days ago and patient states she started taking AZO.  Declined to make an appointment for today; wants to know if she can drop a urine sample off at the office today.  Office to call patient back. Care advice given, denies questions.  Reason for Disposition  Urinating more frequently than usual (i.e., frequency)  Answer Assessment - Initial Assessment Questions 1. SYMPTOM: "What's the main symptom you're concerned about?" (e.g., frequency, incontinence)     burning 2. ONSET: "When did the  burning  start?"     Two days ago 3. PAIN: "Is there any pain?" If Yes, ask: "How bad is it?" (Scale: 1-10; mild, moderate, severe)     Yes, feels kind of heavy and burning 4. CAUSE: "What do you think is causing the symptoms?"     UTI 5. OTHER SYMPTOMS: "Do you have any other symptoms?" (e.g., blood in urine, fever, flank pain, pain with urination)     Pain with urination 6. PREGNANCY: "Is there any chance you are pregnant?" "When was your last menstrual period?"     na  Protocols used: Urinary Symptoms-A-AH

## 2023-12-22 NOTE — Telephone Encounter (Signed)
Spoke with pt and lab and appt with Dr. Darrick Huntsman has been scheduled.

## 2023-12-22 NOTE — Telephone Encounter (Signed)
Yes, labs signed

## 2023-12-22 NOTE — Telephone Encounter (Addendum)
Pt she thinks she may have a UTI. Is it okay if I scheduled for a lab appt and then schedule a 15 minute appt at the beginning of next week.    I've pended the orders

## 2023-12-23 ENCOUNTER — Other Ambulatory Visit (INDEPENDENT_AMBULATORY_CARE_PROVIDER_SITE_OTHER): Payer: HMO

## 2023-12-23 DIAGNOSIS — R300 Dysuria: Secondary | ICD-10-CM | POA: Diagnosis not present

## 2023-12-23 LAB — URINALYSIS, ROUTINE W REFLEX MICROSCOPIC
Bilirubin Urine: NEGATIVE
Ketones, ur: NEGATIVE
Nitrite: NEGATIVE
Specific Gravity, Urine: 1.01 (ref 1.000–1.030)
Total Protein, Urine: 30 — AB
Urine Glucose: NEGATIVE
Urobilinogen, UA: 0.2 (ref 0.0–1.0)
pH: 6.5 (ref 5.0–8.0)

## 2023-12-25 ENCOUNTER — Encounter: Payer: Self-pay | Admitting: Internal Medicine

## 2023-12-25 DIAGNOSIS — B9620 Unspecified Escherichia coli [E. coli] as the cause of diseases classified elsewhere: Secondary | ICD-10-CM | POA: Insufficient documentation

## 2023-12-25 DIAGNOSIS — N390 Urinary tract infection, site not specified: Secondary | ICD-10-CM | POA: Insufficient documentation

## 2023-12-25 LAB — URINE CULTURE
MICRO NUMBER:: 16025877
SPECIMEN QUALITY:: ADEQUATE

## 2023-12-25 MED ORDER — CIPROFLOXACIN HCL 250 MG PO TABS: 250.0000 mg | ORAL_TABLET | Freq: Two times a day (BID) | ORAL | 0 refills | Status: AC

## 2023-12-25 NOTE — Addendum Note (Signed)
Addended by: Sherlene Shams on: 12/25/2023 06:14 PM   Modules accepted: Orders

## 2023-12-29 ENCOUNTER — Encounter: Payer: Self-pay | Admitting: Internal Medicine

## 2023-12-29 ENCOUNTER — Ambulatory Visit (INDEPENDENT_AMBULATORY_CARE_PROVIDER_SITE_OTHER): Payer: HMO | Admitting: Internal Medicine

## 2023-12-29 VITALS — BP 126/68 | HR 66 | Temp 97.9°F | Resp 16 | Ht 67.0 in | Wt 134.0 lb

## 2023-12-29 DIAGNOSIS — I700 Atherosclerosis of aorta: Secondary | ICD-10-CM

## 2023-12-29 DIAGNOSIS — R7303 Prediabetes: Secondary | ICD-10-CM | POA: Diagnosis not present

## 2023-12-29 DIAGNOSIS — N182 Chronic kidney disease, stage 2 (mild): Secondary | ICD-10-CM | POA: Diagnosis not present

## 2023-12-29 DIAGNOSIS — E039 Hypothyroidism, unspecified: Secondary | ICD-10-CM | POA: Diagnosis not present

## 2023-12-29 DIAGNOSIS — B9620 Unspecified Escherichia coli [E. coli] as the cause of diseases classified elsewhere: Secondary | ICD-10-CM

## 2023-12-29 DIAGNOSIS — N390 Urinary tract infection, site not specified: Secondary | ICD-10-CM | POA: Diagnosis not present

## 2023-12-29 NOTE — Assessment & Plan Note (Addendum)
 Stable GFR , 58 ml/min.  .  Continue losartan  low dose,  statin,  with goal to keep BP < 130/80

## 2023-12-29 NOTE — Assessment & Plan Note (Signed)
Reviewed findings of prior CT scan today and reminded her of the reason for taking high potency statin therapy

## 2023-12-29 NOTE — Patient Instructions (Addendum)
 I'm glad your infection has resolved with cipro   Here are a few tips  to prevent UTIs:  Increase water  intake at first sign of infection (to flush the bacteria out of the bladder)  Use flushable wipes  in the bathroom and wipe  from  front  to back, especially after a loose stool to prevent contamination   Avoid using  deodorant pads,  because they can irritate the  urethra and create an opportunity for infection  Empty bladder frequently,  especially BEFORE AND AFTER INTERCOURSE    Drink Cranberry  juice or take cranberry tablets daily (OTC) :  this will alter the pH of your urine which prevents some bacteria from multiplying     Taking an antibiotic can create an imbalance in the normal population of bacteria that live in the small intestine.  This imbalance can persist for 3 months.   Taking a probiotic ( Align, Floraque or Culturelle), the generic version of one of these over the counter medications, or an alternative form (kombucha,  Yogurt, or another dietary source) for a minimum of 3 weeks may help prevent a serious antibiotic associated diarrhea  Called clostridium dificile colitis that occurs when the bacteria population is altered .  Taking a probiotic may also prevent vaginitis due to yeast infections and can be continued indefinitely if you feel that it improves your digestion or your elimination (bowels).

## 2023-12-29 NOTE — Assessment & Plan Note (Signed)
 TSH is therapeutic,  Continue current 50 MCG  levothyoxine dose,   Lab Results  Component Value Date   TSH 1.72 09/15/2023

## 2023-12-29 NOTE — Progress Notes (Signed)
 Subjective:  Patient ID: [name], female    DOB: Jul 22, 1941  Age: 83 y.o. MRN: [id number]  CC: The primary encounter diagnosis was CKD (chronic kidney disease), stage II. Diagnoses of Acquired hypothyroidism, Abdominal aortic atherosclerosis (HCC), Prediabetes, and E. coli UTI were also pertinent to this visit.   HPI RYLEAH Sullivan presents for  Chief Complaint  Patient presents with   follow up on UTI symptoms   83 yr old female presents for follow up on   E Coli UTI treated with cipro  .   She completed 3 days of cipro  and all symptoms have resolved.  She note no preceding aggravating factors: no swimming,  intercourse, prolonged periods without bladder breaks, and no recent diarrhea.       Outpatient Medications Prior to Visit  Medication Sig Dispense Refill   acetaminophen  (TYLENOL ) 500 MG tablet Take 2 tablets (1,000 mg total) by mouth every 8 (eight) hours. 30 tablet 0   aspirin  EC 81 MG tablet Take 81 mg by mouth daily.     cholecalciferol (VITAMIN D ) 1000 UNITS tablet Take 1,000 Units by mouth daily.     Cranberry 450 MG CAPS Take 900 mg by mouth daily. Urinary health supplement     diltiazem  (CARDIZEM  CD) 120 MG 24 hr capsule TAKE 1 CAPSULE BY MOUTH EVERY DAY 90 capsule 3   diphenhydramine-acetaminophen  (TYLENOL  PM) 25-500 MG TABS tablet Take 1 tablet by mouth at bedtime as needed.     glucosamine-chondroitin 500-400 MG tablet Take 1 tablet by mouth daily.      isosorbide  mononitrate (IMDUR ) 30 MG 24 hr tablet TAKE 1 TABLET BY MOUTH AT BEDTIME. 90 tablet 2   levothyroxine  (SYNTHROID ) 50 MCG tablet TAKE 1 TABLET BY MOUTH  DAILY 100 tablet 2   losartan  (COZAAR ) 25 MG tablet TAKE 1 TABLET BY MOUTH EVERYDAY AT BEDTIME 90 tablet 1   Multiple Vitamins-Minerals (MULTIVITAMIN WITH MINERALS) tablet Take 1 tablet by mouth daily.     Omega-3 Fatty Acids (FISH OIL) 1000 MG CPDR Take 1,200 mg by mouth daily.     pantoprazole  (PROTONIX ) 20 MG tablet TAKE 1 TABLET BY MOUTH EVERY  DAY 90 tablet 2   rosuvastatin  (CRESTOR ) 10 MG tablet Take 1 tablet (10 mg total) by mouth daily. (Patient taking differently: Take 10 mg by mouth at bedtime.) 100 tablet 2   traZODone  (DESYREL ) 50 MG tablet TAKE 1/2 TO 1 TABLET BY MOUTH AT BEDTIME AS NEEDED FOR SLEEP 90 tablet 2   docusate sodium  (COLACE) 100 MG capsule Take 1 capsule (100 mg total) by mouth 2 (two) times daily. (Patient not taking: Reported on 09/28/2023) 10 capsule 0   ondansetron  (ZOFRAN ) 4 MG tablet Take 1 tablet (4 mg total) by mouth every 6 (six) hours as needed for nausea. (Patient not taking: Reported on 09/28/2023) 20 tablet 0   No facility-administered medications prior to visit.    Review of Systems;  Patient denies headache, fevers, malaise, unintentional weight loss, skin rash, eye pain, sinus congestion and sinus pain, sore throat, dysphagia,  hemoptysis , cough, dyspnea, wheezing, chest pain, palpitations, orthopnea, edema, abdominal pain, nausea, melena, diarrhea, constipation, flank pain, dysuria, hematuria, urinary  Frequency, nocturia, numbness, tingling, seizures,  Focal weakness, Loss of consciousness,  Tremor, insomnia, depression, anxiety, and suicidal ideation.      Objective:  BP 126/68   Pulse 66   Temp 97.9 F (36.6 C)   Resp 16   Ht 5' 7 (1.702 m)   Wt 134  lb (60.8 kg)   SpO2 98%   BMI 20.99 kg/m   BP Readings from Last 3 Encounters:  12/29/23 126/68  09/28/23 114/65  09/15/23 118/70    Wt Readings from Last 3 Encounters:  12/29/23 134 lb (60.8 kg)  09/28/23 130 lb (59 kg)  09/15/23 130 lb 9.6 oz (59.2 kg)    Physical Exam  Lab Results  Component Value Date   HGBA1C 5.8 09/15/2023   HGBA1C 6.0 06/15/2023   HGBA1C 6.2 12/02/2022    Lab Results  Component Value Date   CREATININE 0.93 09/15/2023   CREATININE 0.77 07/12/2023   CREATININE 0.91 06/15/2023    Lab Results  Component Value Date   WBC 12.2 (H) 07/12/2023   HGB 12.0 07/12/2023   HCT 33.8 (L) 07/12/2023    PLT 227 07/12/2023   GLUCOSE 93 09/15/2023   CHOL 171 09/15/2023   TRIG 125.0 09/15/2023   HDL 60.60 09/15/2023   LDLDIRECT 84.0 09/15/2023   LDLCALC 86 09/15/2023   ALT 16 09/15/2023   AST 21 09/15/2023   NA 140 09/15/2023   K 4.4 09/15/2023   CL 101 09/15/2023   CREATININE 0.93 09/15/2023   BUN 14 09/15/2023   CO2 33 (H) 09/15/2023   TSH 1.72 09/15/2023   HGBA1C 5.8 09/15/2023   MICROALBUR 1.3 12/02/2022    DG Bone Density Result Date: 10/27/2023 EXAM: DUAL X-RAY ABSORPTIOMETRY (DXA) FOR BONE MINERAL DENSITY IMPRESSION: Dear Dr. Marylynn, Your patient [name] completed a FRAX assessment on 10/27/2023 using the Lunar iDXA DXA System (analysis version: 14.10) manufactured by [employer]. The following summarizes the results of our evaluation. PATIENT BIOGRAPHICAL: Name: Ted, Leonhart Patient ID: [id number] Birth Date: 06/05/1941 Height:    67.0 in. Gender:     Female    [age]       Weight:    137.0 lbs. Ethnicity:  White                            Exam Date: 10/27/2023 FRAX* RESULTS:  (version: 3.5) 10-year Probability of Fracture1 Major Osteoporotic Fracture2 Hip Fracture 11.7% 4.8% Population: [place]  (Caucasian) Risk Factors: Family Hist. (Parent hip fracture) Based on Femur (Left) Neck BMD 1 -The 10-year probability of fracture may be lower than reported if the patient has received treatment. 2 -Major Osteoporotic Fracture: Clinical Spine, Forearm, Hip or Shoulder *FRAX is a armed forces logistics/support/administrative officer of the [employer] of [employer] for Metabolic Bone Disease, a World Science Writer (WHO) [employer]. ASSESSMENT: The probability of a major osteoporotic fracture is 11.7% within the next ten years. The probability of a hip fracture is 4.8% within the next ten years. . Your patient [name] completed a BMD test on 10/27/2023 using the [employer] iDXA DXA System (software version: 14.10) manufactured by [employer]. The following summarizes  the results of our evaluation. Technologist: [facility] PATIENT BIOGRAPHICAL: Name: Karen, Sullivan Patient ID: [id number] Birth Date: [birth year] Height: 67.0 in. Gender: Female Exam Date: 10/27/2023 Weight: 137.0 lbs. Indications: Advanced Age, Caucasian, Family Hist. (Parent hip fracture), Family History of Fracture, Hypothyroid, Hysterectomy, Oophorectomy Bilateral, Postmenopausal Fractures: Treatments: Levothyroxine , mobic , Multi-Vitamin with calcium  DENSITOMETRY RESULTS: Site          Region      Measured Date Measured Age WHO Classification Young Adult T-score BMD         %Change vs. Previous Significant Change (*) DualFemur  Total Right 10/27/2023 82.4 Normal -0.1 0.995 g/cm2 -7.4% Yes DualFemur Total Right 05/23/2017 76.0 Normal 0.5 1.074 g/cm2 -1.3% - DualFemur Total Right 10/28/2010 69.4 Normal 0.6 1.088 g/cm2 - - DualFemur Total Mean 10/27/2023 82.4 Normal 0.0 1.004 g/cm2 -5.8% Yes DualFemur Total Mean 05/23/2017 76.0 Normal 0.5 1.066 g/cm2 -1.5% - DualFemur Total Mean 10/28/2010 69.4 Normal 0.6 1.082 g/cm2 - - Right Forearm Radius 33% 10/27/2023 82.4 Osteopenia -1.2 0.768 g/cm2 -6.2% Yes Right Forearm Radius 33% 05/23/2017 76.0 Normal -0.6 0.819 g/cm2 -3.2% - Right Forearm Radius 33% 10/28/2010 69.4 Normal -0.3 0.846 g/cm2 - - ASSESSMENT: The BMD measured at Forearm Radius 33% is 0.768 g/cm2 with a T-score of -1.2. This patient's diagnostic category is LOW BONE MASS/OSTEOPENIA according to World Health Organization [facility]) criteria. The scan quality is good. Lumbar spine was not utilized due to advanced degenerative changes. Comparison to 05/23/2017. Since the prior study, there has been a SIGNIFICANT DECREASE in bone mineral density of the LEFT forearm (-6.2%) and the hips (-5.8%). World Science Writer [facility]) criteria for post-menopausal, Caucasian Women: Normal:                   T-score at or above -1 SD Osteopenia/low bone mass: T-score between -1 and -2.5 SD Osteoporosis:             T-score at or below  -2.5 SD RECOMMENDATIONS: 1. All patients should optimize calcium  and vitamin D  intake. 2. Consider FDA-approved medical therapies in postmenopausal women and men aged 1 years and older, based on the following: a. A hip or vertebral(clinical or morphometric) fracture b. T-score < -2.5 at the femoral neck or spine after appropriate evaluation to exclude secondary causes c. Low bone mass (T-score between -1.0 and -2.5 at the femoral neck or spine) and a 10-year probability of a hip fracture > 3% or a 10-year probability of a major osteoporosis-related fracture > 20% based on the [place] -adapted WHO algorithm 3. Clinician judgment and/or patient preferences may indicate treatment for people with 10-year fracture probabilities above or below these levels FOLLOW-UP: People with diagnosed cases of osteoporosis or at high risk for fracture should have regular bone mineral density tests. For patients eligible for Medicare, routine testing is allowed once every 2 years. The testing frequency can be increased to one year for patients who have rapidly progressing disease, those who are receiving or discontinuing medical therapy to restore bone mass, or have additional risk factors. I have reviewed this report, and agree with the above findings. [facility] Electronically Signed   By: Reyes Phi M.D.   On: 10/27/2023 15:09    Assessment & Plan:  .CKD (chronic kidney disease), stage II Assessment & Plan: Stable GFR , 58 ml/min.  .  Continue losartan  low dose,  statin,  with goal to keep BP < 130/80  Orders: -     Comprehensive metabolic panel; Future  Acquired hypothyroidism Assessment & Plan: TSH is therapeutic,  Continue current 50 MCG  levothyoxine dose,   Lab Results  Component Value Date   TSH 1.72 09/15/2023     Orders: -     TSH; Future  Abdominal aortic atherosclerosis (HCC) Assessment & Plan: Reviewed findings of prior CT scan today and reminded her of the reason for taking high  potency statin therapy    Prediabetes -     Hemoglobin A1c; Future -     Lipid Panel w/reflex Direct LDL; Future  E. coli UTI Assessment & Plan: Sensitive to cipro  ,  now resolved,  strategies to prevent future UTIs discussed       Follow-up: Return in about 6 months (around 06/27/2024).   Verneita LITTIE Kettering, MD

## 2023-12-31 NOTE — Assessment & Plan Note (Signed)
 Sensitive to cipro  , now resolved,  strategies to prevent future UTIs discussed

## 2024-01-06 ENCOUNTER — Ambulatory Visit: Payer: HMO | Admitting: Family Medicine

## 2024-01-06 ENCOUNTER — Ambulatory Visit: Payer: Self-pay | Admitting: Internal Medicine

## 2024-01-06 ENCOUNTER — Encounter: Payer: Self-pay | Admitting: Family Medicine

## 2024-01-06 VITALS — BP 108/74 | HR 63 | Temp 98.2°F | Wt 132.2 lb

## 2024-01-06 DIAGNOSIS — J069 Acute upper respiratory infection, unspecified: Secondary | ICD-10-CM | POA: Insufficient documentation

## 2024-01-06 DIAGNOSIS — R0981 Nasal congestion: Secondary | ICD-10-CM

## 2024-01-06 LAB — POC COVID19 BINAXNOW: SARS Coronavirus 2 Ag: NEGATIVE

## 2024-01-06 NOTE — Progress Notes (Signed)
Karen Alar, MD Phone: [phone]  [name] is a 83 y.o. female who presents today for same day visit.   Cough: notes a couple days of mild cough and maxillary facial pressure and nasal congestion. Blowing clear mucus out of her nose. Some postnasal drip. No sore throat. No dyspnea. No body aches. Notes some headaches and ear discomfort though thinks those are from her hearing aids and wearing her glasses. Notes sister had the flu and is in the hospital.   Social History   Tobacco Use  Smoking Status Former   Types: Cigarettes   Passive exposure: Past  Smokeless Tobacco Never  Tobacco Comments   Smoked x 1 year in her 20's    Current Outpatient Medications on File Prior to Visit  Medication Sig Dispense Refill   acetaminophen (TYLENOL) 500 MG tablet Take 2 tablets (1,000 mg total) by mouth every 8 (eight) hours. 30 tablet 0   aspirin EC 81 MG tablet Take 81 mg by mouth daily.     cholecalciferol (VITAMIN D) 1000 UNITS tablet Take 1,000 Units by mouth daily.     Cranberry 450 MG CAPS Take 900 mg by mouth daily. Urinary health supplement     diltiazem (CARDIZEM CD) 120 MG 24 hr capsule TAKE 1 CAPSULE BY MOUTH EVERY DAY 90 capsule 3   diphenhydramine-acetaminophen (TYLENOL PM) 25-500 MG TABS tablet Take 1 tablet by mouth at bedtime as needed.     glucosamine-chondroitin 500-400 MG tablet Take 1 tablet by mouth daily.      isosorbide mononitrate (IMDUR) 30 MG 24 hr tablet TAKE 1 TABLET BY MOUTH AT BEDTIME. 90 tablet 2   levothyroxine (SYNTHROID) 50 MCG tablet TAKE 1 TABLET BY MOUTH  DAILY 100 tablet 2   losartan (COZAAR) 25 MG tablet TAKE 1 TABLET BY MOUTH EVERYDAY AT BEDTIME 90 tablet 1   Multiple Vitamins-Minerals (MULTIVITAMIN WITH MINERALS) tablet Take 1 tablet by mouth daily.     Omega-3 Fatty Acids (FISH OIL) 1000 MG CPDR Take 1,200 mg by mouth daily.     pantoprazole (PROTONIX) 20 MG tablet TAKE 1 TABLET BY MOUTH EVERY DAY 90 tablet 2   rosuvastatin (CRESTOR) 10  MG tablet Take 1 tablet (10 mg total) by mouth daily. (Patient taking differently: Take 10 mg by mouth at bedtime.) 100 tablet 2   traZODone (DESYREL) 50 MG tablet TAKE 1/2 TO 1 TABLET BY MOUTH AT BEDTIME AS NEEDED FOR SLEEP 90 tablet 2   No current facility-administered medications on file prior to visit.     ROS see history of present illness  Objective  Physical Exam Vitals:   01/06/24 1329  BP: 108/74  Pulse: 63  Temp: 98.2 F (36.8 C)  SpO2: 97%    BP Readings from Last 3 Encounters:  01/06/24 108/74  12/29/23 126/68  09/28/23 114/65   Wt Readings from Last 3 Encounters:  01/06/24 132 lb 3.2 oz (60 kg)  12/29/23 134 lb (60.8 kg)  09/28/23 130 lb (59 kg)    Physical Exam Constitutional:      General: She is not in acute distress.    Appearance: She is not diaphoretic.  HENT:     Right Ear: Tympanic membrane normal.     Left Ear: Tympanic membrane normal.     Mouth/Throat:     Mouth: Mucous membranes are moist.     Pharynx: Oropharynx is clear.  Cardiovascular:     Rate and Rhythm: Normal rate and regular rhythm.     Heart  sounds: Normal heart sounds.  Pulmonary:     Effort: Pulmonary effort is normal.     Breath sounds: Normal breath sounds.  Lymphadenopathy:     Cervical: No cervical adenopathy.  Skin:    General: Skin is warm and dry.  Neurological:     Mental Status: She is alert.      Assessment/Plan: Please see individual problem list.  Viral upper respiratory infection Assessment & Plan: Symptoms are consistent with viral upper respiratory infection.  COVID testing negative today.  Symptoms are not consistent with influenza.  Discussed starting Claritin over-the-counter and Flonase over-the-counter to help with symptoms.  If not improving by the middle of next week she will let us know.   Nasal congestion -     POC COVID-19 BinaxNow    Return if symptoms worsen or fail to improve.   Karen Alar, MD [facility]

## 2024-01-06 NOTE — Telephone Encounter (Signed)
Copied from CRM [id number]. Topic: Clinical - Red Word Triage >> Jan 06, 2024 11:02 AM Adele Barthel wrote: Red Word that prompted transfer to Nurse Triage:   Symptoms stared 3 days ago Pressure across nose Sore when wears glasses. Eyes have been watering.  Frequent runny nose, clear mucus Coughing spells last night No shortness of breath or chest pain.  No fever, chills, sweats, or fatigue.   Has tried gargling with salt water.  Chief Complaint: Sinus pressure Symptoms: Sinus/ear pain, runny nose, recent cough Frequency: 2-3 days Pertinent Negatives: Patient denies fever Disposition: [] ED /[] Urgent Care (no appt availability in office) / [x] Appointment(In office/virtual)/ []  [place] [facility]/ [] Home Care/ [] Refused Recommended Disposition /[] [place] [facility]/ []  Follow-up with PCP Additional Notes: Patient called in to report sinus pressure and congestion. Patient stated she has a headache and feels pressure across her nose. Patient stated it is uncomfortable to wear her glasses and hearing aids because her ears are sensitive at this time. Patient stated she had coughing spells yesterday, but denied a cough today. Patient denied fever and sore throat. This RN advised patient to be seen in the office before the weekend. No availability with PCP. Scheduled same day appointment with another provider in the office. Advised patient to call back if anything changes/worsens. Patient complied.   Reason for Disposition  Earache  Answer Assessment - Initial Assessment Questions 1. LOCATION: "Where does it hurt?"      Pressure across nose 2. ONSET: "When did the sinus pain start?"  (e.g., hours, days)      2-3 days 3. SEVERITY: "How bad is the pain?"   (Scale 1-10; mild, moderate or severe)   - MILD (1-3): doesn't interfere with normal activities    - MODERATE (4-7): interferes with normal activities (e.g., work or school) or awakens from sleep   - SEVERE (8-10): excruciating pain and  patient unable to do any normal activities        States she has a headache and it hurts to wear glasses and hearing aids 4. RECURRENT SYMPTOM: "Have you ever had sinus problems before?" If Yes, ask: "When was the last time?" and "What happened that time?"      States she does not get sick often or have recurrent sinuses infections 5. NASAL CONGESTION: "Is the nose blocked?" If Yes, ask: "Can you open it or must you breathe through your mouth?"     States her nose is runny, but she can breath out of it 6. NASAL DISCHARGE: "Do you have discharge from your nose?" If so ask, "What color?"     Clear 7. FEVER: "Do you have a fever?" If Yes, ask: "What is it, how was it measured, and when did it start?"      Denies 8. OTHER SYMPTOMS: "Do you have any other symptoms?" (e.g., sore throat, cough, earache, difficulty breathing)     Watery eyes, states she was having coughing spells yesterday that would calm down after home care, denies cough today, denies sore throat  Protocols used: Sinus Pain or Congestion-A-AH

## 2024-01-06 NOTE — Patient Instructions (Signed)
Nice to see you. You likely have an upper respiratory illness related to a virus.  Please start over-the-counter Claritin and over-the-counter Flonase to help with your symptoms. If you are not starting to improve by the middle of next week please let us know.

## 2024-01-06 NOTE — Assessment & Plan Note (Signed)
Symptoms are consistent with viral upper respiratory infection.  COVID testing negative today.  Symptoms are not consistent with influenza.  Discussed starting Claritin over-the-counter and Flonase over-the-counter to help with symptoms.  If not improving by the middle of next week she will let us know.

## 2024-01-06 NOTE — Telephone Encounter (Signed)
Pt was seen by Dr. Birdie Sons today.

## 2024-03-01 ENCOUNTER — Other Ambulatory Visit: Payer: Self-pay | Admitting: Cardiovascular Disease

## 2024-03-01 DIAGNOSIS — K449 Diaphragmatic hernia without obstruction or gangrene: Secondary | ICD-10-CM

## 2024-03-15 ENCOUNTER — Ambulatory Visit: Payer: HMO | Admitting: Internal Medicine

## 2024-04-09 NOTE — Progress Notes (Signed)
 Cardiology Office Note  Date:  04/10/2024   ID:  Lashanti, Chambless [birth year], MRN [id number]  PCP:  Thersia Flax, MD   Chief Complaint  Patient presents with   12 month follow up     Patient c/o positional dizziness with having 2 near-syncopal spells, has shortness of breath with feeling anxious or getting in a hurry.     HPI:  [name] is a 83 y.o. female with history of  symptomatic PVCs,  hypothyroidism, and  hyperlipidemia  who presents for follow-up of PVCs, dizziness.  Last seen by myself in clinic February 2024 Takes three BP meds at night 2 episodes of near syncope, getting out of bed and once at the gym Does exercise at the [facility] 4x a week Goes at 9 Am  Eats breakfast at 6:30 Drinks a lot of water  3 bottles/glasses in AM and pm  When busy, rare palpitations  Cardiac CTA 2019, no coronary artery disease, calcium  score 0 2.0  cm anterior mediastinal mass or lymph node on CT 2022 seen in clinic 2019 ryan dunn, Dr. Alvenia Aus in 2018  loss of husband sept 2023 she took care of him for 23 years Son and daughter in town  EKG personally reviewed by myself on todays visit EKG Interpretation Date/Time:  Tuesday Apr 10 2024 10:09:15 EDT Ventricular Rate:  57 PR Interval:  132 QRS Duration:  86 QT Interval:  416 QTC Calculation: 404 R Axis:   53  Text Interpretation: Sinus bradycardia with sinus arrhythmia When compared with ECG of 11-Jul-2023 16:27, No significant change was found Confirmed by Belva Boyden [phone]) on 04/10/2024 10:34:00 AM    Episode of syncope following exercise class at the [facility]  Had completed exercise class at 10:30 AM  During the class had near syncope, got up to leave, and outside the door of her class had syncope, hit her head Came back quick, to ER 11/11/22 PMD stopped telmisartan  BP at home recently 120-130/70  On further discussion she was taking 3 blood pressure medications in the morning diltiazem  ER, telmisartan  and  Imdur  Sometimes not on a full breakfast  Lab work reviewed A1c 6.2   A Holter was performed in 01/2016 and showed 5300 PVCs in 48 hours representing a 3% burden, occasional PACs, and short runs of SVT.     PMH:   has a past medical history of Abdominal aortic atherosclerosis (HCC), Arthritis of both knees, Atypical chest pain, Basal cell carcinoma (10/23/2020), Basal cell carcinoma (05/13/2021), Basal cell carcinoma (12/03/2021), Basal cell carcinoma (BCC) (05/13/2021), Candida esophagitis (HCC) (06/30/2021), Complication of anesthesia, E. coli UTI (09/10/2015), Elevated blood pressure reading in office with diagnosis of hypertension, GERD (gastroesophageal reflux disease), History of dysplastic nevus (05/13/2021), Hyperlipidemia, Hypothyroidism, Insomnia, Laceration of head without foreign body (11/18/2022), Long term current use of aspirin , Peripheral neuropathy, Pre-diabetes, PVC's (premature ventricular contractions), Raynaud's disease, Syncope and collapse, and Vitamin D  deficiency.  PSH:    Past Surgical History:  Procedure Laterality Date   ABDOMINAL HYSTERECTOMY     In 73's   BROW LIFT Bilateral 07/12/2017   Procedure: BLEPHAROPLASTY upper eyelid with excess skin;  Surgeon: Zacarias Hermann, MD;  Location: [facility];  Service: Ophthalmology;  Laterality: Bilateral;  MAC   CATARACT EXTRACTION, BILATERAL     COLONOSCOPY WITH PROPOFOL  N/A 12/29/2015   Procedure: COLONOSCOPY WITH PROPOFOL ;  Surgeon: Stephens Eis, MD;  Location: [facility];  Service: Gastroenterology;  Laterality: N/A;   COLONOSCOPY WITH PROPOFOL  N/A 12/03/2019  Procedure: COLONOSCOPY WITH BIOPSY;  Surgeon: Marnee Sink, MD;  Location: [facility];  Service: Endoscopy;  Laterality: N/A;   ESOPHAGOGASTRODUODENOSCOPY (EGD) WITH PROPOFOL  N/A 06/25/2021   Procedure: ESOPHAGOGASTRODUODENOSCOPY (EGD) WITH PROPOFOL ;  Surgeon: Marnee Sink, MD;  Location: [facility];  Service: Endoscopy;  Laterality: N/A;    POLYPECTOMY N/A 12/03/2019   Procedure: POLYPECTOMY;  Surgeon: Marnee Sink, MD;  Location: [facility];  Service: Endoscopy;  Laterality: N/A;   PTOSIS REPAIR Bilateral 07/12/2017   Procedure: ptosis repair resect ex;  Surgeon: Zacarias Hermann, MD;  Location: [facility];  Service: Ophthalmology;  Laterality: Bilateral;  Requests early   TOTAL KNEE ARTHROPLASTY Right 07/11/2023   Procedure: TOTAL KNEE ARTHROPLASTY;  Surgeon: Venus Ginsberg, MD;  Location: [facility];  Service: Orthopedics;  Laterality: Right;   VAGINAL PROLAPSE REPAIR  2014   Dr. Lucille Saas    Current Outpatient Medications  Medication Sig Dispense Refill   acetaminophen  (TYLENOL ) 500 MG tablet Take 2 tablets (1,000 mg total) by mouth every 8 (eight) hours. 30 tablet 0   aspirin  EC 81 MG tablet Take 81 mg by mouth daily.     cholecalciferol (VITAMIN D ) 1000 UNITS tablet Take 1,000 Units by mouth daily.     Cranberry 450 MG CAPS Take 900 mg by mouth daily. Urinary health supplement     diltiazem  (CARDIZEM  CD) 120 MG 24 hr capsule TAKE 1 CAPSULE BY MOUTH EVERY DAY 90 capsule 3   diphenhydramine-acetaminophen  (TYLENOL  PM) 25-500 MG TABS tablet Take 1 tablet by mouth at bedtime as needed.     glucosamine-chondroitin 500-400 MG tablet Take 1 tablet by mouth daily.      isosorbide  mononitrate (IMDUR ) 30 MG 24 hr tablet TAKE 1 TABLET BY MOUTH AT BEDTIME. 90 tablet 2   levothyroxine  (SYNTHROID ) 50 MCG tablet TAKE 1 TABLET BY MOUTH  DAILY 100 tablet 2   losartan  (COZAAR ) 25 MG tablet TAKE 1 TABLET BY MOUTH EVERYDAY AT BEDTIME 90 tablet 1   Multiple Vitamins-Minerals (MULTIVITAMIN WITH MINERALS) tablet Take 1 tablet by mouth daily.     Omega-3 Fatty Acids (FISH OIL) 1000 MG CPDR Take 1,200 mg by mouth daily.     pantoprazole  (PROTONIX ) 20 MG tablet TAKE 1 TABLET BY MOUTH EVERY DAY 90 tablet 0   rosuvastatin  (CRESTOR ) 10 MG tablet Take 1 tablet (10 mg total) by mouth daily. (Patient taking differently: Take 10 mg by  mouth at bedtime.) 100 tablet 2   traZODone  (DESYREL ) 50 MG tablet TAKE 1/2 TO 1 TABLET BY MOUTH AT BEDTIME AS NEEDED FOR SLEEP 90 tablet 2   No current facility-administered medications for this visit.   Allergies:   Prednisone   Social History:  The patient  reports that she has quit smoking. Her smoking use included cigarettes. She has been exposed to tobacco smoke. She has never used smokeless tobacco. She reports that she does not drink alcohol and does not use drugs.   Family History:   family history includes Breast cancer in her mother, sister, and sister; Cancer in her brother, brother, brother, sister, and sister; Diabetes in her brother, brother, brother, father, and mother; Heart attack in her brother and mother; Pancreatic cancer in her sister; Stroke in her father.   Review of Systems: Review of Systems  Constitutional: Negative.   HENT: Negative.    Respiratory: Negative.    Cardiovascular: Negative.   Gastrointestinal: Negative.   Musculoskeletal: Negative.   Neurological:  Positive for dizziness.  Psychiatric/Behavioral: Negative.  All other systems reviewed and are negative.   PHYSICAL EXAM: VS:  BP (!) 158/82 (BP Location: Left Arm, Patient Position: Sitting, Cuff Size: Normal)   Pulse (!) 57   Ht 5\' 7"  (1.702 m)   Wt 133 lb 6 oz (60.5 kg)   SpO2 98%   BMI 20.89 kg/m  , BMI Body mass index is 20.89 kg/m. Constitutional:  oriented to person, place, and time. No distress.  HENT:  Head: Grossly normal Eyes:  no discharge. No scleral icterus.  Neck: No JVD, no carotid bruits  Cardiovascular: Regular rate and rhythm, no murmurs appreciated Pulmonary/Chest: Clear to auscultation bilaterally, no wheezes or rales Abdominal: Soft.  no distension.  no tenderness.  Musculoskeletal: Normal range of motion Neurological:  normal muscle tone. Coordination normal. No atrophy Skin: Skin warm and dry Psychiatric: normal affect, pleasant   Recent Labs: 07/12/2023:  Hemoglobin 12.0; Platelets 227 09/15/2023: ALT 16; BUN 14; Creatinine, Ser 0.93; Potassium 4.4; Sodium 140; TSH 1.72    Lipid Panel Lab Results  Component Value Date   CHOL 171 09/15/2023   HDL 60.60 09/15/2023   LDLCALC 86 09/15/2023   TRIG 125.0 09/15/2023      Wt Readings from Last 3 Encounters:  04/10/24 133 lb 6 oz (60.5 kg)  01/06/24 132 lb 3.2 oz (60 kg)  12/29/23 134 lb (60.8 kg)      ASSESSMENT AND PLAN:  Problem List Items Addressed This Visit       Cardiology Problems   Abdominal aortic atherosclerosis (HCC) - Primary   Relevant Orders   EKG 12-Lead (Completed)     Other   CKD (chronic kidney disease), stage II   Atypical chest pain   Relevant Orders   EKG 12-Lead (Completed)   Prediabetes   Other Visit Diagnoses       PVC (premature ventricular contraction)       Relevant Orders   EKG 12-Lead (Completed)     Essential hypertension       Relevant Orders   EKG 12-Lead (Completed)     Mixed hyperlipidemia          Mediastinal mass/lymph node 2 cm in size, no change over serial CT scans Felt to be benign finding  Syncope/near syncope No recent episodes of syncope but she does report 2 episodes of dizziness/near syncope 1 getting out of bed in the morning, other 1 at gym class She is taking all 3 of her blood pressure medications before bed  recommend she continue diltiazem  ER 120 in the evening, change the isosorbide  and losartan  to 11 AM after she gets back from the [facility] Continue to stay hydrated  Chest pain Atypical,  prior cardiac CTA with no significant disease No further workup needed  Essential hypertension Continue medications as listed but change the timing as above  Palpitations/PVCs asymptomatic,  Continue diltiazem , we will need to monitor heart rate previously did not tolerate beta-blocker  Hyperlipidemia Tolerating Crestor  10 daily Prior cardiac CTA no coronary calcification  Adjustment disorder Recent loss of husband  September 2023 Doing well, independent   Signed, Juanda Noon, M.D., Ph.D. [facility] [place], [place] [phone]

## 2024-04-10 ENCOUNTER — Ambulatory Visit: Attending: Cardiovascular Disease | Admitting: Cardiovascular Disease

## 2024-04-10 ENCOUNTER — Encounter: Payer: Self-pay | Admitting: Cardiovascular Disease

## 2024-04-10 VITALS — BP 158/82 | HR 57 | Ht 67.0 in | Wt 133.4 lb

## 2024-04-10 DIAGNOSIS — I493 Ventricular premature depolarization: Secondary | ICD-10-CM

## 2024-04-10 DIAGNOSIS — R7303 Prediabetes: Secondary | ICD-10-CM

## 2024-04-10 DIAGNOSIS — E782 Mixed hyperlipidemia: Secondary | ICD-10-CM

## 2024-04-10 DIAGNOSIS — N182 Chronic kidney disease, stage 2 (mild): Secondary | ICD-10-CM

## 2024-04-10 DIAGNOSIS — I700 Atherosclerosis of aorta: Secondary | ICD-10-CM | POA: Diagnosis not present

## 2024-04-10 DIAGNOSIS — I10 Essential (primary) hypertension: Secondary | ICD-10-CM | POA: Diagnosis not present

## 2024-04-10 DIAGNOSIS — R0789 Other chest pain: Secondary | ICD-10-CM

## 2024-04-10 NOTE — Patient Instructions (Addendum)
 Medication Instructions:  Please move the isosorbide  and losartan  to the late morning , at 11 AM after the gym with a drink and snack  Continue diltiazem  at night   If you need a refill on your cardiac medications before your next appointment, please call your pharmacy.   Lab work: No new labs needed  Testing/Procedures: No new testing needed  Follow-Up: At [facility], you and your health needs are our priority.  As part of our continuing mission to provide you with exceptional heart care, we have created designated Provider Care Teams.  These Care Teams include your primary Cardiologist (physician) and Advanced Practice Providers (APPs -  Physician Assistants and Nurse Practitioners) who all work together to provide you with the care you need, when you need it.  You will need a follow up appointment in 12 months  Providers on your designated Care Team:   Laneta Pintos, NP Varney Gentleman, PA-C Cadence Gennaro Khat, [place]  COVID-19 Vaccine Information can be found at: [email or web address] For questions related to vaccine distribution or appointments, please email [email or web address] or call [phone].

## 2024-04-13 ENCOUNTER — Ambulatory Visit: Payer: HMO | Admitting: Urology

## 2024-04-18 ENCOUNTER — Other Ambulatory Visit: Payer: Self-pay | Admitting: Internal Medicine

## 2024-04-20 ENCOUNTER — Ambulatory Visit: Payer: Self-pay | Admitting: Urology

## 2024-04-20 ENCOUNTER — Ambulatory Visit: Admitting: Urology

## 2024-04-26 ENCOUNTER — Other Ambulatory Visit: Payer: Self-pay | Admitting: Internal Medicine

## 2024-04-26 DIAGNOSIS — Z1231 Encounter for screening mammogram for malignant neoplasm of breast: Secondary | ICD-10-CM

## 2024-05-09 ENCOUNTER — Ambulatory Visit
Admission: RE | Admit: 2024-05-09 | Discharge: 2024-05-09 | Disposition: A | Discharge status: Home / Self Care | Source: Ambulatory Visit | Attending: Internal Medicine | Admitting: Internal Medicine

## 2024-05-09 DIAGNOSIS — Z1231 Encounter for screening mammogram for malignant neoplasm of breast: Secondary | ICD-10-CM | POA: Diagnosis not present

## 2024-05-26 ENCOUNTER — Other Ambulatory Visit: Payer: Self-pay | Admitting: Cardiovascular Disease

## 2024-05-26 DIAGNOSIS — K449 Diaphragmatic hernia without obstruction or gangrene: Secondary | ICD-10-CM

## 2024-06-27 ENCOUNTER — Encounter: Payer: Self-pay | Admitting: Internal Medicine

## 2024-06-27 ENCOUNTER — Ambulatory Visit: Payer: HMO | Admitting: Internal Medicine

## 2024-06-27 VITALS — BP 116/60 | HR 59 | Temp 97.8°F | Ht 67.0 in | Wt 133.2 lb

## 2024-06-27 DIAGNOSIS — N182 Chronic kidney disease, stage 2 (mild): Secondary | ICD-10-CM

## 2024-06-27 DIAGNOSIS — E039 Hypothyroidism, unspecified: Secondary | ICD-10-CM | POA: Diagnosis not present

## 2024-06-27 DIAGNOSIS — R7303 Prediabetes: Secondary | ICD-10-CM | POA: Diagnosis not present

## 2024-06-27 DIAGNOSIS — R5383 Other fatigue: Secondary | ICD-10-CM

## 2024-06-27 DIAGNOSIS — R55 Syncope and collapse: Secondary | ICD-10-CM | POA: Diagnosis not present

## 2024-06-27 DIAGNOSIS — E7849 Other hyperlipidemia: Secondary | ICD-10-CM | POA: Diagnosis not present

## 2024-06-27 DIAGNOSIS — F5101 Primary insomnia: Secondary | ICD-10-CM

## 2024-06-27 DIAGNOSIS — G609 Hereditary and idiopathic neuropathy, unspecified: Secondary | ICD-10-CM

## 2024-06-27 DIAGNOSIS — I10 Essential (primary) hypertension: Secondary | ICD-10-CM

## 2024-06-27 LAB — LDL CHOLESTEROL, DIRECT: Direct LDL: 99 mg/dL

## 2024-06-27 LAB — COMPREHENSIVE METABOLIC PANEL WITH GFR
ALT: 18 U/L (ref 0–35)
AST: 22 U/L (ref 0–37)
Albumin: 4.4 g/dL (ref 3.5–5.2)
Alkaline Phosphatase: 36 U/L — ABNORMAL LOW (ref 39–117)
BUN: 11 mg/dL (ref 6–23)
CO2: 34 meq/L — ABNORMAL HIGH (ref 19–32)
Calcium: 9.4 mg/dL (ref 8.4–10.5)
Chloride: 100 meq/L (ref 96–112)
Creatinine, Ser: 0.87 mg/dL (ref 0.40–1.20)
GFR: 61.75 mL/min (ref >60.00)
Glucose, Bld: 89 mg/dL (ref 70–99)
Potassium: 4.5 meq/L (ref 3.5–5.1)
Sodium: 139 meq/L (ref 135–145)
Total Bilirubin: 0.5 mg/dL (ref 0.2–1.2)
Total Protein: 7.2 g/dL (ref 6.0–8.3)

## 2024-06-27 LAB — MICROALBUMIN / CREATININE URINE RATIO
Creatinine,U: 80.6 mg/dL
Microalb Creat Ratio: 16.1 mg/g (ref 0.0–30.0)
Microalb, Ur: 1.3 mg/dL (ref 0.0–1.9)

## 2024-06-27 LAB — CBC WITH DIFFERENTIAL/PLATELET
Basophils Absolute: 0.1 K/uL (ref 0.0–0.1)
Basophils Relative: 1.1 % (ref 0.0–3.0)
Eosinophils Absolute: 0.2 K/uL (ref 0.0–0.7)
Eosinophils Relative: 4.3 % (ref 0.0–5.0)
HCT: 38.4 % (ref 36.0–46.0)
Hemoglobin: 12.7 g/dL (ref 12.0–15.0)
Lymphocytes Relative: 22.2 % (ref 12.0–46.0)
Lymphs Abs: 1.1 K/uL (ref 0.7–4.0)
MCHC: 33.2 g/dL (ref 30.0–36.0)
MCV: 95.3 fl (ref 78.0–100.0)
Monocytes Absolute: 0.4 K/uL (ref 0.1–1.0)
Monocytes Relative: 7.1 % (ref 3.0–12.0)
Neutro Abs: 3.3 K/uL (ref 1.4–7.7)
Neutrophils Relative %: 65.3 % (ref 43.0–77.0)
Platelets: 278 K/uL (ref 150.0–400.0)
RBC: 4.03 Mil/uL (ref 3.87–5.11)
RDW: 13 % (ref 11.5–15.5)
WBC: 5 K/uL (ref 4.0–10.5)

## 2024-06-27 LAB — LIPID PANEL
Cholesterol: 193 mg/dL (ref 0–200)
HDL: 59.7 mg/dL (ref >39.00)
LDL Cholesterol: 107 mg/dL — ABNORMAL HIGH (ref 0–99)
NonHDL: 133.08
Total CHOL/HDL Ratio: 3
Triglycerides: 130 mg/dL (ref 0.0–149.0)
VLDL: 26 mg/dL (ref 0.0–40.0)

## 2024-06-27 LAB — TSH: TSH: 1.98 u[IU]/mL (ref 0.35–5.50)

## 2024-06-27 LAB — HEMOGLOBIN A1C: Hgb A1c MFr Bld: 6.2 % (ref 4.6–6.5)

## 2024-06-27 MED ORDER — TRAZODONE HCL 50 MG PO TABS: 50.0000 mg | ORAL_TABLET | Freq: Every evening | ORAL | 3 refills | Status: AC | PRN

## 2024-06-27 NOTE — Assessment & Plan Note (Signed)
 Managed with  melatonin after dinner and 50 mg qhs trazodone 

## 2024-06-27 NOTE — Assessment & Plan Note (Addendum)
 TSH is therapeutic on  current 50 MCG  levothyoxine dose,    Lab Results  Component Value Date   TSH 1.98 06/27/2024

## 2024-06-27 NOTE — Progress Notes (Unsigned)
 Subjective:  Patient ID: [name], female    DOB: 03-08-1941  Age: 83 y.o. MRN: [id number]  CC: The primary encounter diagnosis was Acquired hypothyroidism. Diagnoses of Familial hyperlipidemia, Other fatigue, Prediabetes, Elevated blood pressure reading in office with diagnosis of hypertension, Syncope and collapse, Idiopathic peripheral neuropathy, and Primary insomnia were also pertinent to this visit.   HPI TEAIRA CROFT presents for  Chief Complaint  Patient presents with   Medical Management of Chronic Issues    6 month follow up    1) Hypertension: patient checks blood pressure twice weekly at home using a wrist cuff .that has been calibrated.   Readings have been for the most part <120/80 at rest . Patient is following a reduced salt diet most days and is taking losartan  25 mg Imdur  30 mg and  cardizem  120 mg  as prescribed  BUT HAS HAD several episodes of feeling light headed.  The episodes have occurred both after exercising and during  the night. She has been taking  diltiazem  for 8 years  for management of PVCs and ulse has been running in the high 50's    2) polyneuropathy: not bothering her much.  Sleeping well.  Exercising 4 times weekly  at the [employer] a senior class.  Has not had any falls or stumbling   3)  insomnia: using 50 mg trazodone    4) HLD:  using rosuvastatin    5) OA:  mild aggravated by weather  right knee stiff    6) Frequent UTI's: no recurrence  since cranberry tablet daily supplementation has been   Outpatient Medications Prior to Visit  Medication Sig Dispense Refill   acetaminophen  (TYLENOL ) 500 MG tablet Take 2 tablets (1,000 mg total) by mouth every 8 (eight) hours. 30 tablet 0   aspirin  EC 81 MG tablet Take 81 mg by mouth daily.     cholecalciferol (VITAMIN D ) 1000 UNITS tablet Take 1,000 Units by mouth daily.     Cranberry 450 MG CAPS Take 900 mg by mouth daily. Urinary health supplement     diltiazem  (CARDIZEM  CD) 120 MG 24 hr capsule  TAKE 1 CAPSULE BY MOUTH EVERY DAY 90 capsule 3   diphenhydramine-acetaminophen  (TYLENOL  PM) 25-500 MG TABS tablet Take 1 tablet by mouth at bedtime as needed.     glucosamine-chondroitin 500-400 MG tablet Take 1 tablet by mouth daily.      isosorbide  mononitrate (IMDUR ) 30 MG 24 hr tablet TAKE 1 TABLET BY MOUTH AT BEDTIME. 90 tablet 2   levothyroxine  (SYNTHROID ) 50 MCG tablet TAKE 1 TABLET BY MOUTH EVERY DAY 100 tablet 2   losartan  (COZAAR ) 25 MG tablet TAKE 1 TABLET BY MOUTH EVERYDAY AT BEDTIME 90 tablet 1   Multiple Vitamins-Minerals (MULTIVITAMIN WITH MINERALS) tablet Take 1 tablet by mouth daily.     Omega-3 Fatty Acids (FISH OIL) 1000 MG CPDR Take 1,200 mg by mouth daily.     pantoprazole  (PROTONIX ) 20 MG tablet TAKE 1 TABLET BY MOUTH EVERY DAY 90 tablet 3   rosuvastatin  (CRESTOR ) 10 MG tablet TAKE 1 TABLET BY MOUTH EVERY DAY 100 tablet 2   traZODone  (DESYREL ) 50 MG tablet TAKE 1/2 TO 1 TABLET BY MOUTH AT BEDTIME AS NEEDED FOR SLEEP 90 tablet 2   No facility-administered medications prior to visit.    Review of Systems;  Patient denies headache, fevers, malaise, unintentional weight loss, skin rash, eye pain, sinus congestion and sinus pain, sore throat, dysphagia,  hemoptysis , cough, dyspnea, wheezing,  chest pain, palpitations, orthopnea, edema, abdominal pain, nausea, melena, diarrhea, constipation, flank pain, dysuria, hematuria, urinary  Frequency, nocturia, numbness, tingling, seizures,  Focal weakness, Loss of consciousness,  Tremor, insomnia, depression, anxiety, and suicidal ideation.      Objective:  BP 116/60 (BP Location: Right Wrist, Patient Position: Sitting, Cuff Size: Normal)   Pulse (!) 59   Temp 97.8 F (36.6 C) (Oral)   Ht 5' 7 (1.702 m)   Wt 133 lb 3.2 oz (60.4 kg)   SpO2 97%   BMI 20.86 kg/m   BP Readings from Last 3 Encounters:  06/27/24 116/60  04/10/24 (!) 158/82  01/06/24 108/74    Wt Readings from Last 3 Encounters:  06/27/24 133 lb 3.2 oz (60.4  kg)  04/10/24 133 lb 6 oz (60.5 kg)  01/06/24 132 lb 3.2 oz (60 kg)    Physical Exam Vitals reviewed.  Constitutional:      General: She is not in acute distress.    Appearance: Normal appearance. She is normal weight. She is not ill-appearing, toxic-appearing or diaphoretic.  HENT:     Head: Normocephalic.  Eyes:     General: No scleral icterus.       Right eye: No discharge.        Left eye: No discharge.     Conjunctiva/sclera: Conjunctivae normal.  Cardiovascular:     Rate and Rhythm: Normal rate and regular rhythm.     Heart sounds: Normal heart sounds.  Pulmonary:     Effort: Pulmonary effort is normal. No respiratory distress.     Breath sounds: Normal breath sounds.  Musculoskeletal:        General: Normal range of motion.  Skin:    General: Skin is warm and dry.  Neurological:     General: No focal deficit present.     Mental Status: She is alert and oriented to person, place, and time. Mental status is at baseline.  Psychiatric:        Mood and Affect: Mood normal.        Behavior: Behavior normal.        Thought Content: Thought content normal.        Judgment: Judgment normal.     Lab Results  Component Value Date   HGBA1C 5.8 09/15/2023   HGBA1C 6.0 06/15/2023   HGBA1C 6.2 12/02/2022    Lab Results  Component Value Date   CREATININE 0.93 09/15/2023   CREATININE 0.77 07/12/2023   CREATININE 0.91 06/15/2023    Lab Results  Component Value Date   WBC 12.2 (H) 07/12/2023   HGB 12.0 07/12/2023   HCT 33.8 (L) 07/12/2023   PLT 227 07/12/2023   GLUCOSE 93 09/15/2023   CHOL 171 09/15/2023   TRIG 125.0 09/15/2023   HDL 60.60 09/15/2023   LDLDIRECT 84.0 09/15/2023   LDLCALC 86 09/15/2023   ALT 16 09/15/2023   AST 21 09/15/2023   NA 140 09/15/2023   K 4.4 09/15/2023   CL 101 09/15/2023   CREATININE 0.93 09/15/2023   BUN 14 09/15/2023   CO2 33 (H) 09/15/2023   TSH 1.72 09/15/2023   HGBA1C 5.8 09/15/2023    MM 3D SCREENING MAMMOGRAM BILATERAL  BREAST Result Date: 05/11/2024 CLINICAL DATA:  Screening. EXAM: DIGITAL SCREENING BILATERAL MAMMOGRAM WITH TOMOSYNTHESIS AND CAD TECHNIQUE: Bilateral screening digital craniocaudal and mediolateral oblique mammograms were obtained. Bilateral screening digital breast tomosynthesis was performed. The images were evaluated with computer-aided detection. COMPARISON:  Previous exam(s). ACR Breast Density Category b: There  are scattered areas of fibroglandular density. FINDINGS: There are no findings suspicious for malignancy. IMPRESSION: No mammographic evidence of malignancy. A result letter of this screening mammogram will be mailed directly to the patient. RECOMMENDATION: Screening mammogram in one year. (Code:SM-B-01Y) BI-RADS CATEGORY  1: Negative. Electronically Signed   By: Dina  Arceo M.D.   On: 05/11/2024 09:30    Assessment & Plan:  .Acquired hypothyroidism Assessment & Plan: TSH is pending on  current 50 MCG  levothyoxine dose,     Orders: -     TSH  Familial hyperlipidemia -     Lipid panel -     LDL cholesterol, direct  Other fatigue -     CBC with Differential/Platelet  Prediabetes -     Comprehensive metabolic panel with GFR -     Hemoglobin A1c  Elevated blood pressure reading in office with diagnosis of hypertension -     Comprehensive metabolic panel with GFR -     Microalbumin / creatinine urine ratio  Syncope and collapse Assessment & Plan: She continues to report occasional episodes of presyncope and light headedness which may be due to her extremely well controlled BP (based on home readings on a acalibrated machine)  and drug induced bradycardia.  Suspending diltiazem  as a trial    Idiopathic peripheral neuropathy Assessment & Plan: Symptoms are mild and not activity limiting  despite d/c gabapentin    Primary insomnia Assessment & Plan: Managed with  melatonin after dinner and 50 mg qhs trazodone     Other orders -     traZODone  HCl; Take 1 tablet (50 mg  total) by mouth at bedtime as needed. for sleep  Dispense: 90 tablet; Refill: 3     I spent 34 minutes on the day of this face to face encounter reviewing patient's  most recent visit with cardiology,  nephrology,  and neurology,  prior relevant surgical and non surgical procedures, recent  labs and imaging studies, counseling on weight management,  reviewing the assessment and plan with patient, and post visit ordering and reviewing of  diagnostics and therapeutics with patient  .   Follow-up: No follow-ups on file.   Verneita LITTIE Kettering, MD

## 2024-06-27 NOTE — Patient Instructions (Addendum)
 I THINK YOUR PULSE IS GETTING TOO SLOW AND THAT'S WHAT IS CAUSING YOUR LIGHT HEADEDNESS  I RECOMMEND YOU  SUSPEND THE DILTIAZEM  FOR A WEEK  AS A TRIAL ,  BECAUSE  YOU MAY NOT NEED IT ANYMORE FOR THE PVC'S     THIS WILL ALLOW YOUR PULSE TO COME UP A LITTLE AND YOU WILL TOLERATE EXERCISE WITHOUT FEELING LIGHT HEADED    CONTINUE THE LOSARTAN  AND IMDUR  AT NIGHT   Check  BP AND PULSE  AFTER you come home from workouts   IDEAL PULSE IS 70 AT REST,  100 TO  WITH EXERCISE

## 2024-06-27 NOTE — Assessment & Plan Note (Signed)
 She continues to report occasional episodes of presyncope and light headedness which may be due to her extremely well controlled BP (based on home readings on a acalibrated machine)  and drug induced bradycardia.  Suspending diltiazem  as a trial

## 2024-06-27 NOTE — Assessment & Plan Note (Addendum)
 Symptoms are mild and not activity limiting  despite d/c gabapentin

## 2024-06-28 ENCOUNTER — Ambulatory Visit: Payer: Self-pay | Admitting: Internal Medicine

## 2024-06-28 NOTE — Assessment & Plan Note (Signed)
 Stable GFR , 58 ml/min.  .  Continue losartan  low dose,  statin,  with goal to keep BP < 130/80  Lab Results  Component Value Date   CREATININE 0.87 06/27/2024   Lab Results  Component Value Date   NA 139 06/27/2024   K 4.5 06/27/2024   CL 100 06/27/2024   CO2 34 (H) 06/27/2024

## 2024-06-28 NOTE — Assessment & Plan Note (Signed)
 Managed with crestor ,  LDL is <100 .  lfts normal.  No changes today   Lab Results  Component Value Date   CHOL 193 06/27/2024   HDL 59.70 06/27/2024   LDLCALC 107 (H) 06/27/2024   LDLDIRECT 99.0 06/27/2024   TRIG 130.0 06/27/2024   CHOLHDL 3 06/27/2024   Lab Results  Component Value Date   ALT 18 06/27/2024   AST 22 06/27/2024   ALKPHOS 36 (L) 06/27/2024   BILITOT 0.5 06/27/2024

## 2024-07-11 DIAGNOSIS — Z96651 Presence of right artificial knee joint: Secondary | ICD-10-CM | POA: Diagnosis not present

## 2024-08-07 ENCOUNTER — Other Ambulatory Visit: Payer: Self-pay

## 2024-08-07 ENCOUNTER — Ambulatory Visit (INDEPENDENT_AMBULATORY_CARE_PROVIDER_SITE_OTHER): Admitting: Nurse Practitioner

## 2024-08-07 ENCOUNTER — Emergency Department

## 2024-08-07 ENCOUNTER — Inpatient Hospital Stay
Admission: EM | Admit: 2024-08-07 | Discharge: 2024-08-09 | DRG: 149 | Disposition: A | Discharge status: Home / Self Care | Attending: Internal Medicine | Admitting: Internal Medicine

## 2024-08-07 ENCOUNTER — Encounter: Payer: Self-pay | Admitting: Nurse Practitioner

## 2024-08-07 ENCOUNTER — Ambulatory Visit: Payer: Self-pay

## 2024-08-07 VITALS — BP 126/64 | HR 89 | Temp 99.9°F | Ht 67.0 in | Wt 135.4 lb

## 2024-08-07 DIAGNOSIS — Z888 Allergy status to other drugs, medicaments and biological substances status: Secondary | ICD-10-CM

## 2024-08-07 DIAGNOSIS — N12 Tubulo-interstitial nephritis, not specified as acute or chronic: Secondary | ICD-10-CM | POA: Diagnosis not present

## 2024-08-07 DIAGNOSIS — M170 Bilateral primary osteoarthritis of knee: Secondary | ICD-10-CM | POA: Diagnosis present

## 2024-08-07 DIAGNOSIS — R42 Dizziness and giddiness: Secondary | ICD-10-CM | POA: Diagnosis not present

## 2024-08-07 DIAGNOSIS — E039 Hypothyroidism, unspecified: Secondary | ICD-10-CM | POA: Diagnosis present

## 2024-08-07 DIAGNOSIS — Z87891 Personal history of nicotine dependence: Secondary | ICD-10-CM

## 2024-08-07 DIAGNOSIS — I639 Cerebral infarction, unspecified: Secondary | ICD-10-CM | POA: Diagnosis not present

## 2024-08-07 DIAGNOSIS — R29818 Other symptoms and signs involving the nervous system: Secondary | ICD-10-CM | POA: Diagnosis not present

## 2024-08-07 DIAGNOSIS — Z90710 Acquired absence of both cervix and uterus: Secondary | ICD-10-CM

## 2024-08-07 DIAGNOSIS — N182 Chronic kidney disease, stage 2 (mild): Secondary | ICD-10-CM | POA: Diagnosis present

## 2024-08-07 DIAGNOSIS — N390 Urinary tract infection, site not specified: Secondary | ICD-10-CM | POA: Diagnosis present

## 2024-08-07 DIAGNOSIS — N3289 Other specified disorders of bladder: Secondary | ICD-10-CM | POA: Diagnosis not present

## 2024-08-07 DIAGNOSIS — Z96651 Presence of right artificial knee joint: Secondary | ICD-10-CM | POA: Diagnosis present

## 2024-08-07 DIAGNOSIS — N179 Acute kidney failure, unspecified: Secondary | ICD-10-CM | POA: Diagnosis present

## 2024-08-07 DIAGNOSIS — R2689 Other abnormalities of gait and mobility: Secondary | ICD-10-CM | POA: Insufficient documentation

## 2024-08-07 DIAGNOSIS — E559 Vitamin D deficiency, unspecified: Secondary | ICD-10-CM | POA: Diagnosis not present

## 2024-08-07 DIAGNOSIS — Z7982 Long term (current) use of aspirin: Secondary | ICD-10-CM

## 2024-08-07 DIAGNOSIS — I340 Nonrheumatic mitral (valve) insufficiency: Secondary | ICD-10-CM | POA: Diagnosis not present

## 2024-08-07 DIAGNOSIS — Z79899 Other long term (current) drug therapy: Secondary | ICD-10-CM | POA: Diagnosis not present

## 2024-08-07 DIAGNOSIS — K5730 Diverticulosis of large intestine without perforation or abscess without bleeding: Secondary | ICD-10-CM | POA: Diagnosis not present

## 2024-08-07 DIAGNOSIS — Z8719 Personal history of other diseases of the digestive system: Secondary | ICD-10-CM

## 2024-08-07 DIAGNOSIS — Z8249 Family history of ischemic heart disease and other diseases of the circulatory system: Secondary | ICD-10-CM

## 2024-08-07 DIAGNOSIS — I6523 Occlusion and stenosis of bilateral carotid arteries: Secondary | ICD-10-CM | POA: Diagnosis not present

## 2024-08-07 DIAGNOSIS — Z9842 Cataract extraction status, left eye: Secondary | ICD-10-CM

## 2024-08-07 DIAGNOSIS — R269 Unspecified abnormalities of gait and mobility: Secondary | ICD-10-CM | POA: Diagnosis not present

## 2024-08-07 DIAGNOSIS — R109 Unspecified abdominal pain: Secondary | ICD-10-CM | POA: Diagnosis not present

## 2024-08-07 DIAGNOSIS — Z79890 Hormone replacement therapy (postmenopausal): Secondary | ICD-10-CM

## 2024-08-07 DIAGNOSIS — Z833 Family history of diabetes mellitus: Secondary | ICD-10-CM

## 2024-08-07 DIAGNOSIS — I672 Cerebral atherosclerosis: Secondary | ICD-10-CM | POA: Diagnosis not present

## 2024-08-07 DIAGNOSIS — Z85828 Personal history of other malignant neoplasm of skin: Secondary | ICD-10-CM

## 2024-08-07 DIAGNOSIS — H8110 Benign paroxysmal vertigo, unspecified ear: Secondary | ICD-10-CM | POA: Diagnosis not present

## 2024-08-07 DIAGNOSIS — Z9841 Cataract extraction status, right eye: Secondary | ICD-10-CM

## 2024-08-07 DIAGNOSIS — I7300 Raynaud's syndrome without gangrene: Secondary | ICD-10-CM | POA: Diagnosis present

## 2024-08-07 DIAGNOSIS — N1330 Unspecified hydronephrosis: Secondary | ICD-10-CM | POA: Diagnosis not present

## 2024-08-07 DIAGNOSIS — E785 Hyperlipidemia, unspecified: Secondary | ICD-10-CM | POA: Diagnosis present

## 2024-08-07 DIAGNOSIS — R251 Tremor, unspecified: Secondary | ICD-10-CM | POA: Diagnosis present

## 2024-08-07 DIAGNOSIS — I700 Atherosclerosis of aorta: Secondary | ICD-10-CM | POA: Diagnosis present

## 2024-08-07 DIAGNOSIS — I771 Stricture of artery: Secondary | ICD-10-CM | POA: Diagnosis not present

## 2024-08-07 DIAGNOSIS — N10 Acute tubulo-interstitial nephritis: Secondary | ICD-10-CM | POA: Diagnosis present

## 2024-08-07 DIAGNOSIS — I361 Nonrheumatic tricuspid (valve) insufficiency: Secondary | ICD-10-CM | POA: Diagnosis not present

## 2024-08-07 DIAGNOSIS — I129 Hypertensive chronic kidney disease with stage 1 through stage 4 chronic kidney disease, or unspecified chronic kidney disease: Secondary | ICD-10-CM | POA: Diagnosis present

## 2024-08-07 DIAGNOSIS — G4700 Insomnia, unspecified: Secondary | ICD-10-CM | POA: Diagnosis present

## 2024-08-07 DIAGNOSIS — G8194 Hemiplegia, unspecified affecting left nondominant side: Secondary | ICD-10-CM | POA: Diagnosis present

## 2024-08-07 DIAGNOSIS — Z823 Family history of stroke: Secondary | ICD-10-CM

## 2024-08-07 DIAGNOSIS — Z87440 Personal history of urinary (tract) infections: Secondary | ICD-10-CM

## 2024-08-07 LAB — ETHANOL: Alcohol, Ethyl (B): <15 mg/dL (ref <15)

## 2024-08-07 LAB — URINALYSIS, ROUTINE W REFLEX MICROSCOPIC
Bilirubin Urine: NEGATIVE
Glucose, UA: NEGATIVE mg/dL
Ketones, ur: NEGATIVE mg/dL
Nitrite: NEGATIVE
Protein, ur: NEGATIVE mg/dL
Specific Gravity, Urine: 1.003 — ABNORMAL LOW (ref 1.005–1.030)
pH: 7 (ref 5.0–8.0)

## 2024-08-07 LAB — COMPREHENSIVE METABOLIC PANEL WITH GFR
ALT: 14 U/L (ref 0–44)
AST: 20 U/L (ref 15–41)
Albumin: 3.6 g/dL (ref 3.5–5.0)
Alkaline Phosphatase: 41 U/L (ref 38–126)
Anion gap: 10 (ref 5–15)
BUN: 18 mg/dL (ref 8–23)
CO2: 26 mmol/L (ref 22–32)
Calcium: 9.1 mg/dL (ref 8.9–10.3)
Chloride: 97 mmol/L — ABNORMAL LOW (ref 98–111)
Creatinine, Ser: 1.26 mg/dL — ABNORMAL HIGH (ref 0.44–1.00)
GFR, Estimated: 42 mL/min — ABNORMAL LOW (ref >60)
Glucose, Bld: 116 mg/dL — ABNORMAL HIGH (ref 70–99)
Potassium: 4.3 mmol/L (ref 3.5–5.1)
Sodium: 133 mmol/L — ABNORMAL LOW (ref 135–145)
Total Bilirubin: 0.8 mg/dL (ref 0.0–1.2)
Total Protein: 7.2 g/dL (ref 6.5–8.1)

## 2024-08-07 LAB — DIFFERENTIAL
Abs Immature Granulocytes: 0.08 K/uL — ABNORMAL HIGH (ref 0.00–0.07)
Basophils Absolute: 0.1 K/uL (ref 0.0–0.1)
Basophils Relative: 0 %
Eosinophils Absolute: 0 K/uL (ref 0.0–0.5)
Eosinophils Relative: 0 %
Immature Granulocytes: 1 %
Lymphocytes Relative: 6 %
Lymphs Abs: 0.8 K/uL (ref 0.7–4.0)
Monocytes Absolute: 1.2 K/uL — ABNORMAL HIGH (ref 0.1–1.0)
Monocytes Relative: 9 %
Neutro Abs: 11.5 K/uL — ABNORMAL HIGH (ref 1.7–7.7)
Neutrophils Relative %: 84 %

## 2024-08-07 LAB — CBC
HCT: 35 % — ABNORMAL LOW (ref 36.0–46.0)
Hemoglobin: 11.9 g/dL — ABNORMAL LOW (ref 12.0–15.0)
MCH: 32.2 pg (ref 26.0–34.0)
MCHC: 34 g/dL (ref 30.0–36.0)
MCV: 94.6 fL (ref 80.0–100.0)
Platelets: 203 K/uL (ref 150–400)
RBC: 3.7 MIL/uL — ABNORMAL LOW (ref 3.87–5.11)
RDW: 12.3 % (ref 11.5–15.5)
WBC: 13.5 K/uL — ABNORMAL HIGH (ref 4.0–10.5)
nRBC: 0 % (ref 0.0–0.2)

## 2024-08-07 LAB — APTT: aPTT: 35 s (ref 24–36)

## 2024-08-07 LAB — PROTIME-INR
INR: 1.3 — ABNORMAL HIGH (ref 0.8–1.2)
Prothrombin Time: 17.2 s — ABNORMAL HIGH (ref 11.4–15.2)

## 2024-08-07 MED ORDER — SODIUM CHLORIDE 0.9 % IV BOLUS
1000.0000 mL | Freq: Once | INTRAVENOUS | Status: AC
  Administered 2024-08-07: 1000 mL via INTRAVENOUS

## 2024-08-07 MED ORDER — SODIUM CHLORIDE 0.9 % IV SOLN
1.0000 g | INTRAVENOUS | Status: AC
  Administered 2024-08-08: 1 g via INTRAVENOUS
  Filled 2024-08-07: qty 10

## 2024-08-07 MED ORDER — SODIUM CHLORIDE 0.9% FLUSH: 3.0000 mL | Freq: Once | INTRAVENOUS | Status: DC

## 2024-08-07 NOTE — Telephone Encounter (Signed)
 FYI: Pt is seeing Vincente today at 3pm for dizziness (already triaged)

## 2024-08-07 NOTE — ED Provider Notes (Signed)
 Clinical Course as of 08/07/24 2356  Tue Aug 07, 2024  2332 Received signout out: weak, lightheaded, concern for pyelonephritis. [ ]  Urine.  [HD]    Clinical Course User Index [HD] Nicholaus Rolland BRAVO, MD   I received signout from this patient at approximately 11:20 PM.  Was told patient was initially to be evaluated for pyelonephritis however after review of patient chart and interviewing the patient the main concern seems to be the possible of TIA/CVA workup.  Patient has had over 24 hours of symptoms and is certainly out of the window for any TNK or embolectomy.  Yesterday patient developed vertigo and lightheadedness after an exercise class.  Has had unstable gait since then.  This morning at her primary care physician's office, developed transient left-sided weakness of upper extremity and lower extremity.  Patient is amnestic to this episode but daughter noticed some tremors on this side.  The episode itself lasted less than 10 minutes.  Currently patient does endorse ongoing vertigo and lightheadedness.  On exam she has possible 4 out of 5 upper extremity left-sided weakness but is otherwise neurovascularly intact no lightheadedness while lying in bed.  Will discuss admission to the hospitalist for TIA/CVA workup.  Hospitalist paged

## 2024-08-07 NOTE — Telephone Encounter (Signed)
 FYI Only or Action Required?: FYI only for provider.  Patient was last seen in primary care on 06/27/2024 by Marylynn Verneita CROME, MD.  Called Nurse Triage reporting Dizziness.  Symptoms began yesterday.  Interventions attempted: Rest, hydration, or home remedies.  Symptoms are: gradually worsening.  Triage Disposition: See HCP Within 4 Hours (Or PCP Triage)  Patient/caregiver understands and will follow disposition?: Yes     Copied from CRM [id number]. Topic: Clinical - Red Word Triage >> Aug 07, 2024 11:01 AM Laymon HERO wrote: Red Word that prompted transfer to Nurse Triage: Was in an exercise class yesterday and started seeing spots and got dizzy and light headed. Blood pressure is at 94/54 Reason for Disposition  [1] Dizziness caused by heat exposure, sudden standing, or poor fluid intake AND [2] no improvement after 2 hours of rest and fluids  Answer Assessment - Initial Assessment Questions BP was 94/54 10 mins ago. Patient denies chest pain, blurred vision or shortness of breath. Patient states she is able to stand and walk just has to take time.    1. DESCRIPTION: Describe your dizziness.     Saw black spots in front of her ears yesterday at start of symptoms.  2. LIGHTHEADED: Do you feel lightheaded? (e.g., somewhat faint, woozy, weak upon standing)     Weakness when standing up.  3. VERTIGO: Do you feel like either you or the room is spinning or tilting? (i.e., vertigo)     Denies  4. SEVERITY: How bad is it?  Do you feel like you are going to faint? Can you stand and walk?     When getting up she feels a little dizzy.  5. ONSET:  When did the dizziness begin?     Yesterday  6. AGGRAVATING FACTORS: Does anything make it worse? (e.g., standing, change in head position)     Standing  7. HEART RATE: Can you tell me your heart rate? How many beats in 15 seconds?  (Note: Not all patients can do this.)       89  8. RECURRENT SYMPTOM: Have you had dizziness  before? If Yes, ask: When was the last time? What happened that time?     Yes, she had an episode of fainting before during an exercise class.   9. OTHER SYMPTOMS: Do you have any other symptoms? (e.g., fever, chest pain, vomiting, diarrhea, bleeding)       Neck soreness and stiffness. Patient states it hurts to turn head side to side.  Protocols used: Dizziness - Lightheadedness-A-AH

## 2024-08-07 NOTE — ED Provider Notes (Signed)
 [facility] Provider Note    Event Date/Time   First MD Initiated Contact with Patient 08/07/24 2156     (approximate)   History   Dizziness   HPI  [name] is a 83 y.o. female history of prior urinary tract infections, hypertension, hyperlipidemia hypothyroidism, Raynaud's, prediabetes   For the last 2 days has felt a bit lightheaded.  Did her exercise class yesterday made it halfway through and started feeling lightheaded.  She walked outside and sat down on a bench and went home.  She experienced during the day just a little bit of achiness in her lower abdomen feeling fatigued.  Then today just did not feel well when she gets up and moves about feeling lightheaded and tired  Went to the doctor's office today when she was standing up she turned to the side and started feel lightheaded and her daughter sort of headache help her not fall.  She denies any numbness or weakness no speech changes.  She just feels lightheaded and fatigued  No fever but has been experience sort of a tremor which she feels like a shaking chill off and on  No ongoing abdominal pain but reports that the discomfort was sort of in the middle of the lower right achy  Physical Exam   Triage Vital Signs: ED Triage Vitals  Encounter Vitals Group     BP 08/07/24 1643 (!) 134/53     Girls Systolic BP Percentile --      Girls Diastolic BP Percentile --      Boys Systolic BP Percentile --      Boys Diastolic BP Percentile --      Pulse Rate 08/07/24 1643 80     Resp 08/07/24 1643 16     Temp 08/07/24 1643 99.2 F (37.3 C)     Temp Source 08/07/24 1643 Oral     SpO2 08/07/24 1643 94 %     Weight 08/07/24 1644 135 lb 5.8 oz (61.4 kg)     Height --      Head Circumference --      Peak Flow --      Pain Score 08/07/24 1644 0     Pain Loc --      Pain Education --      Exclude from Growth Chart --     Most recent vital signs: Vitals:   08/07/24 2316 08/07/24 2320  BP:  (!) 154/51   Pulse:  86  Resp: 14 15  Temp:    SpO2:  99%   Patient reports she is also no she does just a slight runny nose the last day or 2 with minimal congestion in the nose but no cough no shortness of  General: Awake, no distress.  Fully alert daughter at bedside both very pleasant Moves all extremities normally extraocular movements normal normal facial expressions clear speech. CV:  Good peripheral perfusion.  Normal tones and rate Resp:  Normal effort.  Clear lungs bilateral Abd:  No distention.  Soft nontender except she reports some discomfort to deep palpation in the mid to right lower quadrant and along the right flank without rebound or guarding. Other:  Warm well-perfused extremities.  Patient is strength 16 ounce of water  while waiting to be seen without issue   ED Results / Procedures / Treatments   Labs (all labs ordered are listed, but only abnormal results are displayed) Labs Reviewed  PROTIME-INR - Abnormal; Notable for the following components:  Result Value   Prothrombin Time 17.2 (*)    INR 1.3 (*)    All other components within normal limits  CBC - Abnormal; Notable for the following components:   WBC 13.5 (*)    RBC 3.70 (*)    Hemoglobin 11.9 (*)    HCT 35.0 (*)    All other components within normal limits  DIFFERENTIAL - Abnormal; Notable for the following components:   Neutro Abs 11.5 (*)    Monocytes Absolute 1.2 (*)    Abs Immature Granulocytes 0.08 (*)    All other components within normal limits  COMPREHENSIVE METABOLIC PANEL WITH GFR - Abnormal; Notable for the following components:   Sodium 133 (*)    Chloride 97 (*)    Glucose, Bld 116 (*)    Creatinine, Ser 1.26 (*)    GFR, Estimated 42 (*)    All other components within normal limits  URINALYSIS, ROUTINE W REFLEX MICROSCOPIC - Abnormal; Notable for the following components:   Color, Urine STRAW (*)    APPearance CLEAR (*)    Specific Gravity, Urine 1.003 (*)    Hgb urine  dipstick SMALL (*)    Leukocytes,Ua MODERATE (*)    Bacteria, UA RARE (*)    All other components within normal limits  URINE CULTURE  RESP PANEL BY RT-PCR (RSV, FLU A&B, COVID)  RVPGX2  APTT  ETHANOL  LIPID PANEL  CBC  CREATININE, SERUM   Labs demonstrate mild acute on chronic renal disease had a history of CKD in the past though some normal renal functions preceding this and today her GFR 42.  Very mild hyponatremia and hypokalemia.  Leukocytosis with significant left shift white count 13.5 raising suspicion for protection of the acute infectious inflammatory type cause  EKG  Inter by me at 2320 heart rate 90 QRS 80 QTc 430 Normal sinus rhythm no evidence acute ischemia   RADIOLOGY  CT head inter by me is grossly negative for acute intracranial hemorrhage  CT ABDOMEN PELVIS WO CONTRAST Result Date: 08/07/2024 CLINICAL DATA:  Abdominal pain and cramping for 2 days, initial encounter EXAM: CT ABDOMEN AND PELVIS WITHOUT CONTRAST TECHNIQUE: Multidetector CT imaging of the abdomen and pelvis was performed following the standard protocol without IV contrast. RADIATION DOSE REDUCTION: This exam was performed according to the departmental dose-optimization program which includes automated exposure control, adjustment of the mA and/or kV according to patient size and/or use of iterative reconstruction technique. COMPARISON:  06/04/2015 FINDINGS: Lower chest: No acute abnormality. Hepatobiliary: No focal liver abnormality is seen. No gallstones, gallbladder wall thickening, or biliary dilatation. Pancreas: Unremarkable. No pancreatic ductal dilatation or surrounding inflammatory changes. Spleen: Normal in size without focal abnormality. Adrenals/Urinary Tract: Adrenal glands are within normal limits. Kidneys are well visualized bilaterally. Left kidney appears within normal limits. The right kidney demonstrates mild hydronephrosis proximally. The more distal ureter is within normal limits. The  bladder is partially distended. Some thickening of the renal pelvic wall is noted which may represent underlying UTI. Stomach/Bowel: Scattered diverticular change of the colon is noted without evidence of diverticulitis. The appendix appears within normal limits. Small bowel and stomach are unremarkable. Vascular/Lymphatic: Aortic atherosclerosis. No enlarged abdominal or pelvic lymph nodes. Reproductive: Status post hysterectomy. No adnexal masses. Other: No abdominal wall hernia or abnormality. No abdominopelvic ascites. Musculoskeletal: Degenerative changes of lumbar spine. IMPRESSION: Right-sided hydronephrosis with pelvic wall thickening suspicious for UTI. No definitive stone is seen. Diverticulosis without diverticulitis. Electronically Signed   By: Oneil Devonshire  M.D.   On: 08/07/2024 22:43   CT HEAD WO CONTRAST Result Date: 08/07/2024 CLINICAL DATA:  Neuro deficit, acute, stroke suspected At exersize class yesterday felt dizzy. EXAM: CT HEAD WITHOUT CONTRAST TECHNIQUE: Contiguous axial images were obtained from the base of the skull through the vertex without intravenous contrast. RADIATION DOSE REDUCTION: This exam was performed according to the departmental dose-optimization program which includes automated exposure control, adjustment of the mA and/or kV according to patient size and/or use of iterative reconstruction technique. COMPARISON:  CT head 11/11/2022 FINDINGS: Brain: Trace patchy and confluent areas of decreased attenuation are noted throughout the deep and periventricular white matter of the cerebral hemispheres bilaterally, compatible with chronic microvascular ischemic disease. No evidence of large-territorial acute infarction. No parenchymal hemorrhage. No mass lesion. No extra-axial collection. No mass effect or midline shift. No hydrocephalus. Basilar cisterns are patent. Vascular: No hyperdense vessel. Atherosclerotic calcifications are present within the cavernous internal carotid  arteries. Skull: No acute fracture or focal lesion. Sinuses/Orbits: Paranasal sinuses and mastoid air cells are clear. Bilateral lens replacement. Otherwise the orbits are unremarkable. Other: None. IMPRESSION: No acute intracranial abnormality. Electronically Signed   By: Morgane  Naveau M.D.   On: 08/07/2024 18:01   No hypoxia no shortness of breath no respiratory complaints.  Normal lung examination    PROCEDURES:  Critical Care performed: No  Procedures   MEDICATIONS ORDERED IN ED: Medications  sodium chloride  flush (NS) 0.9 % injection 3 mL ( Intravenous Canceled Entry 08/07/24 1647)  cefTRIAXone  (ROCEPHIN ) 1 g in sodium chloride  0.9 % 100 mL IVPB (has no administration in time range)  levothyroxine  (SYNTHROID ) tablet 50 mcg (has no administration in time range)  pantoprazole  (PROTONIX ) EC tablet 20 mg (has no administration in time range)  traZODone  (DESYREL ) tablet 50 mg (has no administration in time range)  isosorbide  mononitrate (IMDUR ) 24 hr tablet 30 mg (has no administration in time range)  diltiazem  (CARDIZEM  CD) 24 hr capsule 120 mg (has no administration in time range)   stroke: early stages of recovery book (has no administration in time range)  0.9 %  sodium chloride  infusion (has no administration in time range)  acetaminophen  (TYLENOL ) tablet 650 mg (has no administration in time range)    Or  acetaminophen  (TYLENOL ) 160 MG/5ML solution 650 mg (has no administration in time range)    Or  acetaminophen  (TYLENOL ) suppository 650 mg (has no administration in time range)  senna-docusate (Senokot-S) tablet 1 tablet (has no administration in time range)  enoxaparin  (LOVENOX ) injection 40 mg (has no administration in time range)  aspirin  suppository 300 mg (has no administration in time range)    Or  aspirin  tablet 325 mg (has no administration in time range)  atorvastatin  (LIPITOR) tablet 40 mg (has no administration in time range)  cefTRIAXone  (ROCEPHIN ) 1 g in sodium  chloride 0.9 % 100 mL IVPB (has no administration in time range)  sodium chloride  0.9 % bolus 1,000 mL (1,000 mLs Intravenous New Bag/Given 08/07/24 2312)     IMPRESSION / MDM / ASSESSMENT AND PLAN / ED COURSE  I reviewed the triage vital signs and the nursing notes.                              Differential diagnosis includes, but is not limited to, simple subtle infection including potential cause such as urinary infection, subtle intra-abdominal infection pyelonephritis, diverticulitis etc.  She does not have severe abdominal pain is  very mild but present in her right mid flank to right lower quadrant and slight tenderness to percussion of the right costovertebral angle.  Accompanied by feeling of fatigue lightheadedness generalized weakness for the last day.  Awake alert well-oriented no central neurologic features.  I suspect her symptom of lightheadedness is related to an element of potential dehydration or infection.  No acute cardiac symptoms CT head reassuring  Clinically doubt evidence of stroke.  Will proceed by obtaining urinalysis, CT abdomen pelvis without contrast as she does have some element of acute renal disease today and a history of chronic kidney disease, I think acute ischemic process very low and doubt concern of an acute vascular cause suspect best to proceed without contrast.  Will hydrate check urinalysis, reassess thereafter but suspicion for potential infection is somewhat high  She is overall awake alert nontoxic drinking water   Patient's presentation is most consistent with acute complicated illness / injury requiring diagnostic workup.      Clinical Course as of 08/08/24 0030  Tue Aug 07, 2024  2332 Received signout out: weak, lightheaded, concern for pyelonephritis. [ ]  Urine.  [HD]    Clinical Course User Index [HD] Nicholaus Rolland BRAVO, MD   Based upon clinical history, CT findings Rocephin  initiated for concerns of urinary tract infection.  Ongoing care  with plan to follow-up on urinalysis and reassessment and disposition assigned to oncoming provider.  At this juncture most suspect case of urinary tract infection/mild pyelonephritis with associated lightheadedness.    FINAL CLINICAL IMPRESSION(S) / ED DIAGNOSES   Final diagnoses:  Pyelonephritis of right kidney  Dizziness     Rx / DC Orders   ED Discharge Orders     None        Note:  This document was prepared using Dragon voice recognition software and may include unintentional dictation errors.   Dicky Anes, MD 08/08/24 0030

## 2024-08-07 NOTE — Assessment & Plan Note (Signed)
 Dizziness and imbalance when upright, No dizziness while sitting on the wheel chair. Pt leaning towards the left side. Denise numbness or tingling sensation.  Normal bilateral upper extrimity strength. No respiratory distress.  -Given pt symptoms need further work up. Advised emergency evaluation. -Pt and daughter agreeable, EMS called.

## 2024-08-07 NOTE — ED Triage Notes (Signed)
 At exersize class yesterday felt dizzy. Same thing happened in December when patient fell.  Came home and c/o chills and headache over night.  Abd cramping, left sided tremors.  BP at home 94/54 called PCP. At 1400 pressure wnl.  Went to PCP, and left side was weak, patient was disoriented , speech slurred. Symptoms resolved    MAE equally and strong. Speech clear. NAD.Patient just c/o fatigue.

## 2024-08-07 NOTE — Progress Notes (Addendum)
 Established Patient Office Visit  Subjective:  Patient ID: [name], female    DOB: Dec 11, 1940  Age: 83 y.o. MRN: [id number]  CC:  Chief Complaint  Patient presents with   Dizziness    Started yesterday in exercise class. Accompanied by Kate, daughter.  Nausea and not been eating much last few days.    Tremors    New this week.  Mostly left arm.    Discussed the use of a AI scribe software for clinical note transcription with the patient, who gave verbal consent to proceed.  HPI  [name] is an 83 year old female who presents with dizziness, left-sided tremors, and imbalance.  Dizziness and lightheadedness began yesterday during exercise, with imbalance especially when standing, improving upon sitting. Blood pressure was low in the 90s this morning but is now stable. Had unstable gait at home and while checking in the office. Daughter states that she was off balanced and leaning towards the left side. The pt does not recall the incidence that happened during check in. Pt on the wheel chair.     Pt states that last night, she had chills, headache, and dizziness, with severe left-sided tremors that started a week ago but were initially less intense. She experiences a sensation of 'cracking' in her ear and difficulty maintaining balance while walking, requiring support.  Denise SOB, vertigo.    Past Medical History:  Diagnosis Date   Abdominal aortic atherosclerosis (HCC)    Arthritis of both knees    Atypical chest pain    Basal cell carcinoma 10/23/2020   right lateral ankle EDC done 12/18/20   Basal cell carcinoma 05/13/2021   right pretibia, [facility] 07/22/21   Basal cell carcinoma 12/03/2021   left chest, [facility] 01/06/2022   Basal cell carcinoma (BCC) 05/13/2021   left forehead, Moh's 07/29/2021   Candida esophagitis (HCC) 06/30/2021   Complication of anesthesia    slow to wake up   E. coli UTI 09/10/2015   Resistant to cipro  .  Keflex  sent x 5 days    Elevated  blood pressure reading in office with diagnosis of hypertension    GERD (gastroesophageal reflux disease)    History of dysplastic nevus 05/13/2021   right dorsal hand, mild   Hyperlipidemia    Hypothyroidism    Insomnia    a.) uses trazodone  PRN   Laceration of head without foreign body 11/18/2022   Long term current use of aspirin     Peripheral neuropathy    Pre-diabetes    PVC's (premature ventricular contractions)    a.  48-hour Holter in 3/17 demonstrated NSR, frequent PVCs totaling 5300 beats representing 3% burden, occasional PACs, short runs of SVT   Raynaud's disease    Syncope and collapse    r/t bp meds   Vitamin D  deficiency     Past Surgical History:  Procedure Laterality Date   ABDOMINAL HYSTERECTOMY     In 30's   BROW LIFT Bilateral 07/12/2017   Procedure: BLEPHAROPLASTY upper eyelid with excess skin;  Surgeon: Ashley Greig CHRISTELLA, MD;  Location: [facility];  Service: Ophthalmology;  Laterality: Bilateral;  MAC   CATARACT EXTRACTION, BILATERAL     COLONOSCOPY WITH PROPOFOL  N/A 12/29/2015   Procedure: COLONOSCOPY WITH PROPOFOL ;  Surgeon: Deward CINDERELLA Piedmont, MD;  Location: [facility] ENDOSCOPY;  Service: Gastroenterology;  Laterality: N/A;   COLONOSCOPY WITH PROPOFOL  N/A 12/03/2019   Procedure: COLONOSCOPY WITH BIOPSY;  Surgeon: Jinny Carmine, MD;  Location: [facility];  Service: Endoscopy;  Laterality: N/A;   ESOPHAGOGASTRODUODENOSCOPY (EGD) WITH PROPOFOL  N/A 06/25/2021   Procedure: ESOPHAGOGASTRODUODENOSCOPY (EGD) WITH PROPOFOL ;  Surgeon: Jinny Carmine, MD;  Location: [facility];  Service: Endoscopy;  Laterality: N/A;   POLYPECTOMY N/A 12/03/2019   Procedure: POLYPECTOMY;  Surgeon: Jinny Carmine, MD;  Location: [facility];  Service: Endoscopy;  Laterality: N/A;   PTOSIS REPAIR Bilateral 07/12/2017   Procedure: ptosis repair resect ex;  Surgeon: Ashley Greig HERO, MD;  Location: [facility] SURGERY [facility];  Service: Ophthalmology;  Laterality: Bilateral;  Requests  early   TOTAL KNEE ARTHROPLASTY Right 07/11/2023   Procedure: TOTAL KNEE ARTHROPLASTY;  Surgeon: Lorelle Hussar, MD;  Location: [facility] ORS;  Service: Orthopedics;  Laterality: Right;   VAGINAL PROLAPSE REPAIR  2014   Dr. Kathe    Family History  Problem Relation Age of Onset   Breast cancer Mother        60's   Heart attack Mother    Diabetes Mother    Stroke Father    Diabetes Father    Pancreatic cancer Sister    Cancer Sister        pancreatic   Cancer Sister        anal   Breast cancer Sister    Breast cancer Sister    Cancer Brother        liver   Diabetes Brother    Heart attack Brother    Diabetes Brother    Cancer Brother        stomach   Cancer Brother    Diabetes Brother    Colon cancer Neg Hx     Social History   Socioeconomic History   Marital status: Widowed    Spouse name: Not on file   Number of children: 2   Years of education: Not on file   Highest education level: Not on file  Occupational History   Occupation: Retired Engineer, site  Tobacco Use   Smoking status: Former    Types: Cigarettes    Passive exposure: Past   Smokeless tobacco: Never   Tobacco comments:    Smoked x 1 year in her 20's  Vaping Use   Vaping status: Never Used  Substance and Sexual Activity   Alcohol use: No   Drug use: No   Sexual activity: Never  Other Topics Concern   Not on file  Social History Narrative   Daily Caffeine Use:  One cup coffee in am Regular Exercise -  5 days a week x 1 hour - gym, aerobic and wt training      Left Handed    Lives in a two story home but stays on first floor.    Social Drivers of Corporate investment banker Strain: Low Risk  (07/11/2024)   Received from [facility]   Overall Financial Resource Strain (CARDIA)    Difficulty of Paying Living Expenses: Not hard at all  Food Insecurity: No Food Insecurity (07/11/2024)   Received from [facility]   Hunger Vital Sign    Within the  past 12 months, you worried that your food would run out before you got the money to buy more.: Never true    Within the past 12 months, the food you bought just didn't last and you didn't have money to get more.: Never true  Transportation Needs: No Transportation Needs (07/11/2024)   Received from [facility] - Transportation    In the  past 12 months, has lack of transportation kept you from medical appointments or from getting medications?: No    Lack of Transportation (Non-Medical): No  Physical Activity: Sufficiently Active (09/28/2023)   Exercise Vital Sign    Days of Exercise per Week: 4 days    Minutes of Exercise per Session: 60 min  Stress: No Stress Concern Present (09/28/2023)   [employer] of Occupational Health - Occupational Stress Questionnaire    Feeling of Stress : Not at all  Social Connections: Moderately Integrated (09/28/2023)   Social Connection and Isolation Panel    Frequency of Communication with Friends and Family: More than three times a week    Frequency of Social Gatherings with Friends and Family: More than three times a week    Attends Religious Services: More than 4 times per year    Active Member of [employer] or Organizations: Yes    Attends Banker Meetings: More than 4 times per year    Marital Status: Widowed  Intimate Partner Violence: Not At Risk (09/28/2023)   Humiliation, Afraid, Rape, and Kick questionnaire    Fear of Current or Ex-Partner: No    Emotionally Abused: No    Physically Abused: No    Sexually Abused: No     Outpatient Medications Prior to Visit  Medication Sig Dispense Refill   acetaminophen  (TYLENOL ) 500 MG tablet Take 2 tablets (1,000 mg total) by mouth every 8 (eight) hours. 30 tablet 0   aspirin  EC 81 MG tablet Take 81 mg by mouth daily.     cholecalciferol (VITAMIN D ) 1000 UNITS tablet Take 1,000 Units by mouth daily.     Cranberry 450 MG CAPS Take 900 mg by mouth daily. Urinary  health supplement     diltiazem  (CARDIZEM  CD) 120 MG 24 hr capsule TAKE 1 CAPSULE BY MOUTH EVERY DAY 90 capsule 3   diphenhydramine-acetaminophen  (TYLENOL  PM) 25-500 MG TABS tablet Take 1 tablet by mouth at bedtime as needed.     glucosamine-chondroitin 500-400 MG tablet Take 1 tablet by mouth daily.      isosorbide  mononitrate (IMDUR ) 30 MG 24 hr tablet TAKE 1 TABLET BY MOUTH AT BEDTIME. 90 tablet 2   levothyroxine  (SYNTHROID ) 50 MCG tablet TAKE 1 TABLET BY MOUTH EVERY DAY 100 tablet 2   losartan  (COZAAR ) 25 MG tablet TAKE 1 TABLET BY MOUTH EVERYDAY AT BEDTIME 90 tablet 1   Multiple Vitamins-Minerals (MULTIVITAMIN WITH MINERALS) tablet Take 1 tablet by mouth daily.     Omega-3 Fatty Acids (FISH OIL) 1000 MG CPDR Take 1,200 mg by mouth daily.     pantoprazole  (PROTONIX ) 20 MG tablet TAKE 1 TABLET BY MOUTH EVERY DAY 90 tablet 3   rosuvastatin  (CRESTOR ) 10 MG tablet TAKE 1 TABLET BY MOUTH EVERY DAY 100 tablet 2   traZODone  (DESYREL ) 50 MG tablet Take 1 tablet (50 mg total) by mouth at bedtime as needed. for sleep 90 tablet 3   No facility-administered medications prior to visit.    Allergies  Allergen Reactions   Prednisone     crazy    ROS Review of Systems  Neurological:  Positive for dizziness.   Negative unless indicated in HPI.    Objective:    Physical Exam Constitutional:      Appearance: Normal appearance.  Cardiovascular:     Rate and Rhythm: Normal rate and regular rhythm.     Pulses: Normal pulses.     Heart sounds: Normal heart sounds.  Pulmonary:     Effort:  Pulmonary effort is normal.     Breath sounds: Normal breath sounds. No stridor. No wheezing.  Neurological:     General: No focal deficit present.     Mental Status: She is alert and oriented to person, place, and time.     Motor: Weakness present.     Coordination: Finger-Nose-Finger Test abnormal.     Gait: Gait abnormal.     BP 126/64   Pulse 89   Temp 99.9 F (37.7 C) (Oral)   Ht 5' 7  (1.702 m)   Wt 135 lb 6.4 oz (61.4 kg)   SpO2 97%   BMI 21.21 kg/m  Wt Readings from Last 3 Encounters:  08/07/24 135 lb 6.4 oz (61.4 kg)  06/27/24 133 lb 3.2 oz (60.4 kg)  04/10/24 133 lb 6 oz (60.5 kg)     Health Maintenance  Topic Date Due   Medicare Annual Wellness (AWV)  09/27/2024   COVID-19 Vaccine (6 - 2025-26 season) 08/23/2024 (Originally 07/23/2024)   Influenza Vaccine  02/19/2025 (Originally 06/22/2024)   Mammogram  05/09/2025   DTaP/Tdap/Td (4 - Td or Tdap) 11/11/2032   Pneumococcal Vaccine: 50+ Years  Completed   DEXA SCAN  Completed   Zoster Vaccines- Shingrix  Completed   HPV VACCINES  Aged Out   Meningococcal B Vaccine  Aged Out   Hepatitis C Screening  Discontinued    There are no preventive care reminders to display for this patient.  Lab Results  Component Value Date   TSH 1.98 06/27/2024   Lab Results  Component Value Date   WBC 5.0 06/27/2024   HGB 12.7 06/27/2024   HCT 38.4 06/27/2024   MCV 95.3 06/27/2024   PLT 278.0 06/27/2024   Lab Results  Component Value Date   NA 139 06/27/2024   K 4.5 06/27/2024   CO2 34 (H) 06/27/2024   GLUCOSE 89 06/27/2024   BUN 11 06/27/2024   CREATININE 0.87 06/27/2024   BILITOT 0.5 06/27/2024   ALKPHOS 36 (L) 06/27/2024   AST 22 06/27/2024   ALT 18 06/27/2024   PROT 7.2 06/27/2024   ALBUMIN 4.4 06/27/2024   CALCIUM  9.4 06/27/2024   ANIONGAP 9 07/12/2023   GFR 61.75 06/27/2024   Lab Results  Component Value Date   CHOL 193 06/27/2024   Lab Results  Component Value Date   HDL 59.70 06/27/2024   Lab Results  Component Value Date   LDLCALC 107 (H) 06/27/2024   Lab Results  Component Value Date   TRIG 130.0 06/27/2024   Lab Results  Component Value Date   CHOLHDL 3 06/27/2024   Lab Results  Component Value Date   HGBA1C 6.2 06/27/2024      Assessment & Plan:  Dizziness Assessment & Plan: Dizziness and imbalance when upright, No dizziness while sitting on the wheel chair. Pt leaning  towards the left side. Denise numbness or tingling sensation.  Normal bilateral upper extrimity strength. No respiratory distress.  -Given pt symptoms need further work up. Advised emergency evaluation. -Pt and daughter agreeable, EMS called.      Gait disturbance    Follow-up: No follow-ups on file.   Shatha Hooser, NP

## 2024-08-08 ENCOUNTER — Inpatient Hospital Stay

## 2024-08-08 ENCOUNTER — Encounter: Payer: Self-pay | Admitting: Internal Medicine

## 2024-08-08 ENCOUNTER — Inpatient Hospital Stay
Admit: 2024-08-08 | Discharge: 2024-08-08 | Disposition: A | Discharge status: Home / Self Care | Attending: Internal Medicine | Admitting: Internal Medicine

## 2024-08-08 DIAGNOSIS — E785 Hyperlipidemia, unspecified: Secondary | ICD-10-CM | POA: Diagnosis not present

## 2024-08-08 DIAGNOSIS — I6523 Occlusion and stenosis of bilateral carotid arteries: Secondary | ICD-10-CM | POA: Diagnosis not present

## 2024-08-08 DIAGNOSIS — Z87891 Personal history of nicotine dependence: Secondary | ICD-10-CM | POA: Diagnosis not present

## 2024-08-08 DIAGNOSIS — R42 Dizziness and giddiness: Secondary | ICD-10-CM

## 2024-08-08 DIAGNOSIS — I771 Stricture of artery: Secondary | ICD-10-CM

## 2024-08-08 DIAGNOSIS — Z79899 Other long term (current) drug therapy: Secondary | ICD-10-CM

## 2024-08-08 DIAGNOSIS — I7300 Raynaud's syndrome without gangrene: Secondary | ICD-10-CM | POA: Diagnosis not present

## 2024-08-08 DIAGNOSIS — H8110 Benign paroxysmal vertigo, unspecified ear: Secondary | ICD-10-CM | POA: Diagnosis not present

## 2024-08-08 DIAGNOSIS — E559 Vitamin D deficiency, unspecified: Secondary | ICD-10-CM | POA: Diagnosis not present

## 2024-08-08 DIAGNOSIS — N10 Acute tubulo-interstitial nephritis: Secondary | ICD-10-CM | POA: Diagnosis present

## 2024-08-08 DIAGNOSIS — I340 Nonrheumatic mitral (valve) insufficiency: Secondary | ICD-10-CM

## 2024-08-08 DIAGNOSIS — I639 Cerebral infarction, unspecified: Secondary | ICD-10-CM | POA: Diagnosis not present

## 2024-08-08 DIAGNOSIS — Z9842 Cataract extraction status, left eye: Secondary | ICD-10-CM | POA: Diagnosis not present

## 2024-08-08 DIAGNOSIS — N179 Acute kidney failure, unspecified: Secondary | ICD-10-CM | POA: Diagnosis not present

## 2024-08-08 DIAGNOSIS — Z85828 Personal history of other malignant neoplasm of skin: Secondary | ICD-10-CM | POA: Diagnosis not present

## 2024-08-08 DIAGNOSIS — Z7982 Long term (current) use of aspirin: Secondary | ICD-10-CM

## 2024-08-08 DIAGNOSIS — I361 Nonrheumatic tricuspid (valve) insufficiency: Secondary | ICD-10-CM | POA: Diagnosis not present

## 2024-08-08 DIAGNOSIS — N390 Urinary tract infection, site not specified: Secondary | ICD-10-CM | POA: Diagnosis not present

## 2024-08-08 DIAGNOSIS — I700 Atherosclerosis of aorta: Secondary | ICD-10-CM | POA: Diagnosis not present

## 2024-08-08 DIAGNOSIS — R251 Tremor, unspecified: Secondary | ICD-10-CM | POA: Diagnosis not present

## 2024-08-08 DIAGNOSIS — M170 Bilateral primary osteoarthritis of knee: Secondary | ICD-10-CM | POA: Diagnosis not present

## 2024-08-08 DIAGNOSIS — G4700 Insomnia, unspecified: Secondary | ICD-10-CM | POA: Diagnosis not present

## 2024-08-08 DIAGNOSIS — Z79890 Hormone replacement therapy (postmenopausal): Secondary | ICD-10-CM | POA: Diagnosis not present

## 2024-08-08 DIAGNOSIS — Z9841 Cataract extraction status, right eye: Secondary | ICD-10-CM | POA: Diagnosis not present

## 2024-08-08 DIAGNOSIS — E039 Hypothyroidism, unspecified: Secondary | ICD-10-CM | POA: Diagnosis not present

## 2024-08-08 DIAGNOSIS — N182 Chronic kidney disease, stage 2 (mild): Secondary | ICD-10-CM | POA: Diagnosis not present

## 2024-08-08 DIAGNOSIS — Z8249 Family history of ischemic heart disease and other diseases of the circulatory system: Secondary | ICD-10-CM | POA: Diagnosis not present

## 2024-08-08 DIAGNOSIS — G8194 Hemiplegia, unspecified affecting left nondominant side: Secondary | ICD-10-CM | POA: Diagnosis not present

## 2024-08-08 DIAGNOSIS — I129 Hypertensive chronic kidney disease with stage 1 through stage 4 chronic kidney disease, or unspecified chronic kidney disease: Secondary | ICD-10-CM | POA: Diagnosis not present

## 2024-08-08 DIAGNOSIS — Z833 Family history of diabetes mellitus: Secondary | ICD-10-CM | POA: Diagnosis not present

## 2024-08-08 DIAGNOSIS — Z96651 Presence of right artificial knee joint: Secondary | ICD-10-CM | POA: Diagnosis not present

## 2024-08-08 LAB — LIPID PANEL
Cholesterol: 125 mg/dL (ref 0–200)
HDL: 52 mg/dL (ref >40)
LDL Cholesterol: 61 mg/dL (ref 0–99)
Total CHOL/HDL Ratio: 2.4 ratio
Triglycerides: 60 mg/dL (ref <150)
VLDL: 12 mg/dL (ref 0–40)

## 2024-08-08 LAB — ECHOCARDIOGRAM COMPLETE
AR max vel: 2.08 cm2
AV Area VTI: 2.29 cm2
AV Area mean vel: 2.15 cm2
AV Mean grad: 5 mmHg
AV Peak grad: 9.9 mmHg
Ao pk vel: 1.57 m/s
Area-P 1/2: 3.91 cm2
Calc EF: 48.3 %
Height: 67 in
MV VTI: 3.07 cm2
S' Lateral: 2.8 cm
Single Plane A2C EF: 47.1 %
Single Plane A4C EF: 51.8 %
Weight: 2165.8 [oz_av]

## 2024-08-08 LAB — CBC
HCT: 32.8 % — ABNORMAL LOW (ref 36.0–46.0)
Hemoglobin: 11.1 g/dL — ABNORMAL LOW (ref 12.0–15.0)
MCH: 32.4 pg (ref 26.0–34.0)
MCHC: 33.8 g/dL (ref 30.0–36.0)
MCV: 95.6 fL (ref 80.0–100.0)
Platelets: 188 K/uL (ref 150–400)
RBC: 3.43 MIL/uL — ABNORMAL LOW (ref 3.87–5.11)
RDW: 12.6 % (ref 11.5–15.5)
WBC: 12.7 K/uL — ABNORMAL HIGH (ref 4.0–10.5)
nRBC: 0 % (ref 0.0–0.2)

## 2024-08-08 LAB — CREATININE, SERUM
Creatinine, Ser: 1.23 mg/dL — ABNORMAL HIGH (ref 0.44–1.00)
GFR, Estimated: 44 mL/min — ABNORMAL LOW (ref >60)

## 2024-08-08 LAB — PHOSPHORUS: Phosphorus: 2.9 mg/dL (ref 2.5–4.6)

## 2024-08-08 LAB — RESP PANEL BY RT-PCR (RSV, FLU A&B, COVID)  RVPGX2
Influenza A by PCR: NEGATIVE
Influenza B by PCR: NEGATIVE
Resp Syncytial Virus by PCR: NEGATIVE
SARS Coronavirus 2 by RT PCR: NEGATIVE

## 2024-08-08 LAB — IRON AND TIBC
Iron: 10 ug/dL — ABNORMAL LOW (ref 28–170)
Saturation Ratios: 5 % — ABNORMAL LOW (ref 10.4–31.8)
TIBC: 217 ug/dL — ABNORMAL LOW (ref 250–450)
UIBC: 207 ug/dL

## 2024-08-08 LAB — MAGNESIUM: Magnesium: 2.4 mg/dL (ref 1.7–2.4)

## 2024-08-08 MED ORDER — BISACODYL 10 MG RE SUPP: 10.0000 mg | Freq: Every day | RECTAL | Status: DC | PRN

## 2024-08-08 MED ORDER — ASPIRIN 300 MG RE SUPP: 300.0000 mg | Freq: Every day | RECTAL | Status: DC

## 2024-08-08 MED ORDER — SODIUM CHLORIDE 0.9 % IV SOLN: INTRAVENOUS | Status: DC

## 2024-08-08 MED ORDER — ENOXAPARIN SODIUM 40 MG/0.4ML IJ SOSY
40.0000 mg | PREFILLED_SYRINGE | INTRAMUSCULAR | Status: DC
  Administered 2024-08-08 – 2024-08-09 (×2): 40 mg via SUBCUTANEOUS
  Filled 2024-08-08 (×2): qty 0.4

## 2024-08-08 MED ORDER — ATORVASTATIN CALCIUM 20 MG PO TABS
40.0000 mg | ORAL_TABLET | Freq: Every day | ORAL | Status: DC
Start: 2024-08-08 — End: 2024-08-09
  Administered 2024-08-08 – 2024-08-09 (×2): 40 mg via ORAL
  Filled 2024-08-08 (×2): qty 2

## 2024-08-08 MED ORDER — SODIUM CHLORIDE 0.9 % IV SOLN
1.0000 g | INTRAVENOUS | Status: DC
  Administered 2024-08-08: 1 g via INTRAVENOUS
  Filled 2024-08-08: qty 10

## 2024-08-08 MED ORDER — POLYETHYLENE GLYCOL 3350 17 G PO PACK
17.0000 g | PACK | Freq: Two times a day (BID) | ORAL | Status: DC
  Administered 2024-08-08 (×2): 17 g via ORAL
  Filled 2024-08-08 (×3): qty 1

## 2024-08-08 MED ORDER — DILTIAZEM HCL ER COATED BEADS 120 MG PO CP24
120.0000 mg | ORAL_CAPSULE | Freq: Every day | ORAL | Status: DC
  Administered 2024-08-08 – 2024-08-09 (×2): 120 mg via ORAL
  Filled 2024-08-08 (×2): qty 1

## 2024-08-08 MED ORDER — PERFLUTREN LIPID MICROSPHERE
1.0000 mL | INTRAVENOUS | Status: AC | PRN
  Administered 2024-08-08: 2 mL via INTRAVENOUS

## 2024-08-08 MED ORDER — BISACODYL 5 MG PO TBEC
10.0000 mg | DELAYED_RELEASE_TABLET | Freq: Every day | ORAL | Status: DC
  Administered 2024-08-08: 10 mg via ORAL
  Filled 2024-08-08: qty 2

## 2024-08-08 MED ORDER — PANTOPRAZOLE SODIUM 20 MG PO TBEC
20.0000 mg | DELAYED_RELEASE_TABLET | Freq: Every day | ORAL | Status: DC
  Administered 2024-08-08 – 2024-08-09 (×2): 20 mg via ORAL
  Filled 2024-08-08 (×3): qty 1

## 2024-08-08 MED ORDER — TRAZODONE HCL 50 MG PO TABS
50.0000 mg | ORAL_TABLET | Freq: Every evening | ORAL | Status: DC | PRN
Start: 2024-08-08 — End: 2024-08-09
  Administered 2024-08-08 (×2): 50 mg via ORAL
  Filled 2024-08-08 (×2): qty 1

## 2024-08-08 MED ORDER — ISOSORBIDE MONONITRATE ER 30 MG PO TB24
30.0000 mg | ORAL_TABLET | Freq: Every day | ORAL | Status: DC
  Administered 2024-08-08 (×2): 30 mg via ORAL
  Filled 2024-08-08 (×2): qty 1

## 2024-08-08 MED ORDER — LEVOTHYROXINE SODIUM 50 MCG PO TABS
50.0000 ug | ORAL_TABLET | Freq: Every day | ORAL | Status: DC
  Administered 2024-08-08 – 2024-08-09 (×2): 50 ug via ORAL
  Filled 2024-08-08 (×2): qty 1

## 2024-08-08 MED ORDER — IOHEXOL 350 MG/ML SOLN
50.0000 mL | Freq: Once | INTRAVENOUS | Status: AC | PRN
  Administered 2024-08-08: 50 mL via INTRAVENOUS

## 2024-08-08 MED ORDER — ASPIRIN 81 MG PO TBEC
81.0000 mg | DELAYED_RELEASE_TABLET | Freq: Every day | ORAL | Status: DC
  Administered 2024-08-08 – 2024-08-09 (×2): 81 mg via ORAL
  Filled 2024-08-08 (×2): qty 1

## 2024-08-08 MED ORDER — ACETAMINOPHEN 160 MG/5ML PO SOLN: 650.0000 mg | ORAL | Status: DC | PRN

## 2024-08-08 MED ORDER — SENNOSIDES-DOCUSATE SODIUM 8.6-50 MG PO TABS: 1.0000 | ORAL_TABLET | Freq: Every evening | ORAL | Status: DC | PRN

## 2024-08-08 MED ORDER — ACETAMINOPHEN 325 MG PO TABS: 650.0000 mg | ORAL_TABLET | ORAL | Status: DC | PRN

## 2024-08-08 MED ORDER — STROKE: EARLY STAGES OF RECOVERY BOOK: Freq: Once | Status: AC

## 2024-08-08 MED ORDER — ASPIRIN 325 MG PO TABS: 325.0000 mg | ORAL_TABLET | Freq: Every day | ORAL | Status: DC

## 2024-08-08 MED ORDER — ACETAMINOPHEN 650 MG RE SUPP: 650.0000 mg | RECTAL | Status: DC | PRN

## 2024-08-08 NOTE — ED Notes (Signed)
 Pt given breakfast meal. Pt getting US  at this time.

## 2024-08-08 NOTE — Progress Notes (Signed)
 Per Dr Von, change NIH order to Q shift

## 2024-08-08 NOTE — Progress Notes (Signed)
 SLP Cancellation Note  Patient Details Name: [name] MRN: [id number] DOB: [birth year]   Cancelled treatment:       Reason Eval/Treat Not Completed: SLP screened, no needs identified, will sign off (chart reviewed; consulted NSG and met w/ pt/Family in room.)  Pt is a 83 y.o. female with PMHx significant of hypothyroidism, abdominal aortic aneurysm, basal cell carcinoma, recurrent UTI, essential HTN, HLD, Raynaud's phenomena from history of syncope, vitamin D  deficiency, chronic low back pain who presented to the ER via her PCP with complaint of dizziness, transient left-sided weakness, and tremors that started yesterday in an exercise class. She feels back to her baseline now per report.  Pt denied any difficulty swallowing and is currently on a regular diet; tolerates swallowing pills w/ water  per NSG. Pt conversed in conversation w/out overt expressive/receptive deficits noted; pt denied any speech-language deficits today. Speech clear. No further skilled ST services indicated as pt appears at her communication and swallowing baseline. Pt agreed. NSG to reconsult if any change in status while admitted.      Comer Portugal, MS, CCC-SLP Speech Language Pathologist Rehab Services; [facility] [phone] (ascom) [name] 08/08/2024, 11:16 AM

## 2024-08-08 NOTE — H&P (Signed)
 History and Physical    Patient: [name] FMW:[id number] DOB: 04/12/1941 DOA: 08/07/2024 DOS: the patient was seen and examined on 08/08/2024 PCP: Karen Verneita CROME, MD  Patient coming from: Home  Chief Complaint:  Chief Complaint  Patient presents with   Dizziness   HPI: [name] is a 83 y.o. female with medical history significant of hypothyroidism, abdominal aortic aneurysm, basal cell carcinoma, recurrent UTI, essential hypertension, hyperlipidemia, Raynaud's phenomena from history of syncope, vitamin D  deficiency, chronic low back pain who presented to the ER after going to her primary care physician's office today with complaint of dizziness that started yesterday in an exercise class as well as tremors.  Mostly involving the left arm.  Patient is losing her balance especially when he is she stands.  It improves when she stands down.  Her blood pressure was notably in the 90s this morning but now is back to normal.  Per daughter patient has been leaning to the left side.  She did not recall any major incidents.  Patient reported previous chills headaches and dizziness about a week ago.  Symptoms have been coming and going.  At this point is concern for possible need acute CVA.  Patient therefore is being admitted to the hospital for workup of CVA.  Review of Systems: As mentioned in the history of present illness. All other systems reviewed and are negative. Past Medical History:  Diagnosis Date   Abdominal aortic atherosclerosis (HCC)    Arthritis of both knees    Atypical chest pain    Basal cell carcinoma 10/23/2020   right lateral ankle EDC done 12/18/20   Basal cell carcinoma 05/13/2021   right pretibia, [facility] 07/22/21   Basal cell carcinoma 12/03/2021   left chest, [facility] 01/06/2022   Basal cell carcinoma (BCC) 05/13/2021   left forehead, Moh's 07/29/2021   Candida esophagitis (HCC) 06/30/2021   Complication of anesthesia    slow to wake up   E. coli UTI 09/10/2015    Resistant to cipro  .  Keflex  sent x 5 days    Elevated blood pressure reading in office with diagnosis of hypertension    GERD (gastroesophageal reflux disease)    History of dysplastic nevus 05/13/2021   right dorsal hand, mild   Hyperlipidemia    Hypothyroidism    Insomnia    a.) uses trazodone  PRN   Laceration of head without foreign body 11/18/2022   Long term current use of aspirin     Peripheral neuropathy    Pre-diabetes    PVC's (premature ventricular contractions)    a.  48-hour Holter in 3/17 demonstrated NSR, frequent PVCs totaling 5300 beats representing 3% burden, occasional PACs, short runs of SVT   Raynaud's disease    Syncope and collapse    r/t bp meds   Vitamin D  deficiency    Past Surgical History:  Procedure Laterality Date   ABDOMINAL HYSTERECTOMY     In 30's   BROW LIFT Bilateral 07/12/2017   Procedure: BLEPHAROPLASTY upper eyelid with excess skin;  Surgeon: Karen Greig CHRISTELLA, MD;  Location: [facility];  Service: Ophthalmology;  Laterality: Bilateral;  MAC   CATARACT EXTRACTION, BILATERAL     COLONOSCOPY WITH PROPOFOL  N/A 12/29/2015   Procedure: COLONOSCOPY WITH PROPOFOL ;  Surgeon: Karen CINDERELLA Piedmont, MD;  Location: [facility] ENDOSCOPY;  Service: Gastroenterology;  Laterality: N/A;   COLONOSCOPY WITH PROPOFOL  N/A 12/03/2019   Procedure: COLONOSCOPY WITH BIOPSY;  Surgeon: Karen Carmine, MD;  Location: [facility];  Service:  Endoscopy;  Laterality: N/A;   ESOPHAGOGASTRODUODENOSCOPY (EGD) WITH PROPOFOL  N/A 06/25/2021   Procedure: ESOPHAGOGASTRODUODENOSCOPY (EGD) WITH PROPOFOL ;  Surgeon: Karen Carmine, MD;  Location: [facility] SURGERY [facility];  Service: Endoscopy;  Laterality: N/A;   POLYPECTOMY N/A 12/03/2019   Procedure: POLYPECTOMY;  Surgeon: Karen Carmine, MD;  Location: [facility];  Service: Endoscopy;  Laterality: N/A;   PTOSIS REPAIR Bilateral 07/12/2017   Procedure: ptosis repair resect ex;  Surgeon: Karen Greig HERO, MD;  Location: [facility]ERY [facility];   Service: Ophthalmology;  Laterality: Bilateral;  Requests early   TOTAL KNEE ARTHROPLASTY Right 07/11/2023   Procedure: TOTAL KNEE ARTHROPLASTY;  Surgeon: Karen Hussar, MD;  Location: [facility] ORS;  Service: Orthopedics;  Laterality: Right;   VAGINAL PROLAPSE REPAIR  2014   Dr. Kathe   Social History:  reports that she has quit smoking. Her smoking use included cigarettes. She has been exposed to tobacco smoke. She has never used smokeless tobacco. She reports that she does not drink alcohol and does not use drugs.  Allergies  Allergen Reactions   Prednisone     crazy    Family History  Problem Relation Age of Onset   Breast cancer Mother        49's   Heart attack Mother    Diabetes Mother    Stroke Father    Diabetes Father    Pancreatic cancer Sister    Cancer Sister        pancreatic   Cancer Sister        anal   Breast cancer Sister    Breast cancer Sister    Cancer Brother        liver   Diabetes Brother    Heart attack Brother    Diabetes Brother    Cancer Brother        stomach   Cancer Brother    Diabetes Brother    Colon cancer Neg Hx     Prior to Admission medications   Medication Sig Start Date End Date Taking? Authorizing Provider  acetaminophen  (TYLENOL ) 500 MG tablet Take 2 tablets (1,000 mg total) by mouth every 8 (eight) hours. 07/12/23   Karen Debby BROCKS, PA-C  aspirin  EC 81 MG tablet Take 81 mg by mouth daily.    [provider]  cholecalciferol (VITAMIN D ) 1000 UNITS tablet Take 1,000 Units by mouth daily.    [provider]  Cranberry 450 MG CAPS Take 900 mg by mouth daily. Urinary health supplement    [provider]  diltiazem  (CARDIZEM  CD) 120 MG 24 hr capsule TAKE 1 CAPSULE BY MOUTH EVERY DAY 12/19/23   Gollan, Timothy J, MD  diphenhydramine-acetaminophen  (TYLENOL  PM) 25-500 MG TABS tablet Take 1 tablet by mouth at bedtime as needed.    [provider]  glucosamine-chondroitin 500-400 MG tablet Take 1  tablet by mouth daily.     [provider]  isosorbide  mononitrate (IMDUR ) 30 MG 24 hr tablet TAKE 1 TABLET BY MOUTH AT BEDTIME. 12/19/23   Gollan, Timothy J, MD  levothyroxine  (SYNTHROID ) 50 MCG tablet TAKE 1 TABLET BY MOUTH EVERY DAY 04/19/24   Karen Verneita CROME, MD  losartan  (COZAAR ) 25 MG tablet TAKE 1 TABLET BY MOUTH EVERYDAY AT BEDTIME 12/16/23   Tullo, Teresa L, MD  Multiple Vitamins-Minerals (MULTIVITAMIN WITH MINERALS) tablet Take 1 tablet by mouth daily.    [provider]  Omega-3 Fatty Acids (FISH OIL) 1000 MG CPDR Take 1,200 mg by mouth daily.    [provider]  pantoprazole  (PROTONIX ) 20 MG tablet TAKE 1 TABLET BY MOUTH EVERY DAY 05/28/24   Gollan, Timothy J, MD  rosuvastatin  (CRESTOR ) 10 MG tablet TAKE 1 TABLET BY MOUTH EVERY DAY 04/19/24   Karen Verneita CROME, MD  traZODone  (DESYREL ) 50 MG tablet Take 1 tablet (50 mg total) by mouth at bedtime as needed. for sleep 06/27/24   Karen Verneita CROME, MD    Physical Exam: Vitals:   08/07/24 1643 08/07/24 1644 08/07/24 2316 08/07/24 2320  BP: (!) 134/53  (!) 154/51   Pulse: 80   86  Resp: 16  14 15   Temp: 99.2 F (37.3 C)     TempSrc: Oral     SpO2: 94%   99%  Weight:  61.4 kg     Constitutional: NAD, calm, comfortable Eyes: PERRL, lids and conjunctivae normal ENMT: Mucous membranes are moist. Posterior pharynx clear of any exudate or lesions.Normal dentition.  Neck: normal, supple, no masses, no thyromegaly Respiratory: clear to auscultation bilaterally, no wheezing, no crackles. Normal respiratory effort. No accessory muscle use.  Cardiovascular: Regular rate and rhythm, no murmurs / rubs / gallops. No extremity edema. 2+ pedal pulses. No carotid bruits.  Abdomen: no tenderness, no masses palpated. No hepatosplenomegaly. Bowel sounds positive.  Musculoskeletal: Good range of motion, no joint swelling or tenderness, Skin: no rashes, lesions, ulcers. No induration Neurologic: CN 2-12 grossly intact. Sensation  intact, DTR normal. Strength 3 out of 5 power in the left upper lower extremity Psychiatric: Normal judgment and insight. Alert and oriented x 3. Normal mood  Data Reviewed:  Temperature 99.9, blood pressure 154/51, white count 13.5 hemoglobin 11.9 sodium 133.  Potassium 4.3 and creatinine 1.26.  BUN 80 chloride 97 INR 1.3.  Urinalysis showed straw urine with moderate leukocytes.  WBC 11-20 with rare bacteria head CT without contrast showed no acute findings CT abdomen pelvis showed right-sided hydronephrosis with pelvic wall thickening suspicious for UTI no stone is seen  Assessment and Plan:  #1 suspected CVA: Patient had dizziness and some tremors on the left side.  Negative head CT.  Will initiate workup for CVA.  MRI of the brain, carotid Dopplers, echocardiogram and also initiate aspirin  and statin.  Depend on the results we will get neurology consult.  #2 acute hypothyroidism: Continue levothyroxine .  #3 possible UTI: Initiate Rocephin .  Urine and blood cultures obtained.  Continue to monitor  #4 chronic kidney disease stage II: Stable at baseline.  #5 hyperlipidemia: Continue with statin  #6 vitamin D  deficiency: Continue replacement    Advance Care Planning:   Code Status: Full Code   Consults: None  Family Communication: Daughter at bedside  Severity of Illness: The appropriate patient status for this patient is INPATIENT. Inpatient status is judged to be reasonable and necessary in order to provide the required intensity of service to ensure the patient's safety. The patient's presenting symptoms, physical exam findings, and initial radiographic and laboratory data in the context of their chronic comorbidities is felt to place them at high risk for further clinical deterioration. Furthermore, it is not anticipated that the patient will be medically stable for discharge from the hospital within 2 midnights of admission.   * I certify that at the point of admission it is my  clinical judgment that the patient will require inpatient hospital care spanning beyond 2 midnights from the point of admission due to high intensity of service, high risk for further deterioration and high frequency of surveillance required.*  AuthorBETHA SIM KNOLL, MD 08/08/2024  12:10 AM  For on call review [email or web address].

## 2024-08-08 NOTE — Evaluation (Signed)
 Occupational Therapy Evaluation Patient Details Name: [name] MRN: [id number] DOB: 12-04-1940 Today's Date: 08/08/2024   History of Present Illness   83 y.o. female with PMHx significant of hypothyroidism, abdominal aortic aneurysm, basal cell carcinoma, recurrent UTI, essential HTN, HLD, Raynaud's phenomena from history of syncope, vitamin D  deficiency, chronic low back pain who presented to the ER via her PCP with complaint of dizziness, transient left-sided weakness, and tremors that started yesterday in an exercise class. Patient is losing her balance especially when she stands, improving when she sits down.  Per daughter patient has been leaning to the left side.     Clinical Impressions Pt was seen for OT evaluation this date. Prior to hospital admission, pt was active and independent, working out regularly and denies falls. Pt presents with orthostatic vitals taken (negative) and asymptomatic. Pt able to complete ADL and mobility near baseline. Encouraged to take her time with positional changes to minimize risk of falls. Pt/dtr verbalized understanding. Do not anticipate the need for follow up OT services upon acute hospital DC. Will sign off.     If plan is discharge home, recommend the following:         Functional Status Assessment   Patient has not had a recent decline in their functional status     Equipment Recommendations   None recommended by OT     Recommendations for Other Services         Precautions/Restrictions   Precautions Precautions: None Restrictions Weight Bearing Restrictions Per Provider Order: No     Mobility Bed Mobility Overal bed mobility: Independent                  Transfers Overall transfer level: Independent                        Balance Overall balance assessment: Modified Independent (didn't have her shoes on)                                         ADL either performed or  assessed with clinical judgement   ADL Overall ADL's : Independent                                             Vision         Perception         Praxis         Pertinent Vitals/Pain Pain Assessment Pain Assessment: No/denies pain     Extremity/Trunk Assessment Upper Extremity Assessment Upper Extremity Assessment: Overall WFL for tasks assessed   Lower Extremity Assessment Lower Extremity Assessment: Overall WFL for tasks assessed   Cervical / Trunk Assessment Cervical / Trunk Assessment: Normal   Communication Communication Communication: Impaired Factors Affecting Communication: Hearing impaired   Cognition Arousal: Alert Behavior During Therapy: WFL for tasks assessed/performed Cognition: No apparent impairments                               Following commands: Intact       Cueing  General Comments          Exercises     Shoulder Instructions      Home Living Family/patient  expects to be discharged to:: Private residence Living Arrangements: Children Available Help at Discharge: Family;Available PRN/intermittently Type of Home: House Home Access: Stairs to enter Entrance Stairs-Number of Steps: 3 STE in front, 7-8 with R rail from garage (also has stair lift)   Home Layout: Two level;Able to live on main level with bedroom/bathroom     Bathroom Shower/Tub: Arts development officer Toilet: Handicapped height     Home Equipment: Cane - quad;Cane - single point;Shower seat - built in;BSC/3in1;Wheelchair - Doctor, general practice Comments: DME from late spouse      Prior Functioning/Environment Prior Level of Function : Independent/Modified Independent;Driving             Mobility Comments: no falls, indep, works out almost daily at the [employer] ADLs Comments: indep    OT Problem List: Other (comment) (BP issues)   OT Treatment/Interventions:        OT  Goals(Current goals can be found in the care plan section)   Acute Rehab OT Goals Patient Stated Goal: go home OT Goal Formulation: All assessment and education complete, DC therapy   OT Frequency:       Co-evaluation PT/OT/SLP Co-Evaluation/Treatment: Yes Reason for Co-Treatment: For patient/therapist safety;To address functional/ADL transfers PT goals addressed during session: Mobility/safety with mobility;Balance OT goals addressed during session: ADL's and self-care      AM-PAC OT 6 Clicks Daily Activity     Outcome Measure Help from another person eating meals?: None Help from another person taking care of personal grooming?: None Help from another person toileting, which includes using toliet, bedpan, or urinal?: None Help from another person bathing (including washing, rinsing, drying)?: None Help from another person to put on and taking off regular upper body clothing?: None Help from another person to put on and taking off regular lower body clothing?: None 6 Click Score: 24   End of Session Equipment Utilized During Treatment: Gait belt  Activity Tolerance: Patient tolerated treatment well Patient left: in bed;with call bell/phone within reach;with family/visitor present  OT Visit Diagnosis: Unsteadiness on feet (R26.81)                Time: 9048-8992 OT Time Calculation (min): 16 min Charges:  OT General Charges $OT Visit: 1 Visit OT Evaluation $OT Eval Low Complexity: 1 Low  Warren SAUNDERS., MPH, MS, OTR/L ascom [phone] 08/08/24, 10:39 AM

## 2024-08-08 NOTE — Evaluation (Signed)
 Physical Therapy Evaluation Patient Details Name: [name] MRN: [id number] DOB: [birth year] Today's Date: 08/08/2024  History of Present Illness  83 y.o. female with PMHx significant of hypothyroidism, abdominal aortic aneurysm, basal cell carcinoma, recurrent UTI, essential HTN, HLD, Raynaud's phenomena from history of syncope, vitamin D  deficiency, chronic low back pain who presented to the ER via her PCP with complaint of dizziness, transient left-sided weakness, and tremors that started yesterday in an exercise class. Patient is losing her balance especially when she stands, improving when she sits down.  Per daughter patient has been leaning to the left side.  Clinical Impression  Patient received in bed, daughter at bedside. Patient reports feeling back to her normal. She had orthostatics taken which were negative. Patient is independent with all mobility at this time. She does not require skilled PT follow up. Signing off.          If plan is discharge home, recommend the following:     Can travel by private vehicle    yes    Equipment Recommendations None recommended by PT  Recommendations for Other Services       Functional Status Assessment Patient has not had a recent decline in their functional status     Precautions / Restrictions Precautions Precautions: None Restrictions Weight Bearing Restrictions Per Provider Order: No      Mobility  Bed Mobility Overal bed mobility: Independent                  Transfers Overall transfer level: Independent                      Ambulation/Gait Ambulation/Gait assistance: Independent   Assistive device: None Gait Pattern/deviations: WFL(Within Functional Limits)          Stairs            Wheelchair Mobility     Tilt Bed    Modified Rankin (Stroke Patients Only)       Balance Overall balance assessment: No apparent balance deficits (not formally assessed)                                            Pertinent Vitals/Pain Pain Assessment Pain Assessment: No/denies pain    Home Living Family/patient expects to be discharged to:: Private residence Living Arrangements: Children Available Help at Discharge: Family;Available PRN/intermittently Type of Home: House Home Access: Stairs to enter   Entrance Stairs-Number of Steps: 3 STE in front, 7-8 with R rail from garage (also has stair lift)   Home Layout: Two level;Able to live on main level with bedroom/bathroom Home Equipment: [place] - quad;Cane - single point;Shower seat - built in;BSC/3in1;Wheelchair Pharmacist, community Comments: DME from late spouse    Prior Function Prior Level of Function : Independent/Modified Independent;Driving             Mobility Comments: no falls, indep, works out almost daily at the [employer] ADLs Comments: indep     Extremity/Trunk Assessment   Upper Extremity Assessment Upper Extremity Assessment: Overall WFL for tasks assessed    Lower Extremity Assessment Lower Extremity Assessment: Overall WFL for tasks assessed    Cervical / Trunk Assessment Cervical / Trunk Assessment: Normal  Communication   Communication Communication: Impaired Factors Affecting Communication: Hearing impaired    Cognition Arousal: Alert Behavior During Therapy: [facility] for tasks assessed/performed  PT - Cognitive impairments: No apparent impairments                         Following commands: Intact       Cueing       General Comments      Exercises     Assessment/Plan    PT Assessment Patient does not need any further PT services  PT Problem List         PT Treatment Interventions      PT Goals (Current goals can be found in the Care Plan section)  Acute Rehab PT Goals Patient Stated Goal: return home PT Goal Formulation: With patient/family Time For Goal Achievement: 08/11/24 Potential to Achieve Goals: Good     Frequency       Co-evaluation PT/OT/SLP Co-Evaluation/Treatment: Yes Reason for Co-Treatment: For patient/therapist safety;To address functional/ADL transfers PT goals addressed during session: Mobility/safety with mobility;Balance OT goals addressed during session: ADL's and self-care       AM-PAC PT 6 Clicks Mobility  Outcome Measure Help needed turning from your back to your side while in a flat bed without using bedrails?: None Help needed moving from lying on your back to sitting on the side of a flat bed without using bedrails?: None Help needed moving to and from a bed to a chair (including a wheelchair)?: None Help needed standing up from a chair using your arms (e.g., wheelchair or bedside chair)?: None Help needed to walk in hospital room?: None Help needed climbing 3-5 steps with a railing? : None 6 Click Score: 24    End of Session   Activity Tolerance: Patient tolerated treatment well Patient left: in bed Nurse Communication: Mobility status      Time: 9050-8992 PT Time Calculation (min) (ACUTE ONLY): 18 min   Charges:   PT Evaluation $PT Eval Low Complexity: 1 Low   PT General Charges $$ ACUTE PT VISIT: 1 Visit         Tyah Acord, PT, GCS 08/08/24,10:39 AM

## 2024-08-08 NOTE — Consult Note (Signed)
 Hospital Consult    Reason for Consult:  Possible TIA/CVA Requesting Physician:  Dr Rolland Moats MD  MRN #:  [id number]  History of Present Illness: This is a 83 y.o. female with medical history significant of hypothyroidism, abdominal aortic aneurysm, basal cell carcinoma, recurrent UTI, essential hypertension, hyperlipidemia, Raynaud's phenomena from history of syncope, vitamin D  deficiency, chronic low back pain who presented to the ER after going to her primary care physician's office today with complaint of dizziness that started yesterday in an exercise class as well as tremors.  Mostly involving the left arm.  Patient is losing her balance especially when he is she stands.  It improves when she stands down.  Her blood pressure was notably in the 90s this morning but now is back to normal.  Per daughter patient has been leaning to the left side.  She did not recall any major incidents. Patients symptoms have been coming and going.   Upon work up patient underwent CT of head and neck and ultrasounds of the carotid arteries. CTA reveals tortuous left internal carotid artery with narrowing lumen. There was no significant ICA stenosis. There is possible 50-69% stenosis. The right carotid artery noted to have less than 50% stenosis. Vascular Surgery consulted to evaluate.   Past Medical History:  Diagnosis Date   Abdominal aortic atherosclerosis (HCC)    Arthritis of both knees    Atypical chest pain    Basal cell carcinoma 10/23/2020   right lateral ankle EDC done 12/18/20   Basal cell carcinoma 05/13/2021   right pretibia, [facility] 07/22/21   Basal cell carcinoma 12/03/2021   left chest, [facility] 01/06/2022   Basal cell carcinoma (BCC) 05/13/2021   left forehead, Moh's 07/29/2021   Candida esophagitis (HCC) 06/30/2021   Complication of anesthesia    slow to wake up   E. coli UTI 09/10/2015   Resistant to cipro  .  Keflex  sent x 5 days    Elevated blood pressure reading in office with diagnosis of  hypertension    GERD (gastroesophageal reflux disease)    History of dysplastic nevus 05/13/2021   right dorsal hand, mild   Hyperlipidemia    Hypothyroidism    Insomnia    a.) uses trazodone  PRN   Laceration of head without foreign body 11/18/2022   Long term current use of aspirin     Peripheral neuropathy    Pre-diabetes    PVC's (premature ventricular contractions)    a.  48-hour Holter in 3/17 demonstrated NSR, frequent PVCs totaling 5300 beats representing 3% burden, occasional PACs, short runs of SVT   Raynaud's disease    Syncope and collapse    r/t bp meds   Vitamin D  deficiency     Past Surgical History:  Procedure Laterality Date   ABDOMINAL HYSTERECTOMY     In 30's   BROW LIFT Bilateral 07/12/2017   Procedure: BLEPHAROPLASTY upper eyelid with excess skin;  Surgeon: Ashley Greig HERO, MD;  Location: [facility];  Service: Ophthalmology;  Laterality: Bilateral;  MAC   CATARACT EXTRACTION, BILATERAL     COLONOSCOPY WITH PROPOFOL  N/A 12/29/2015   Procedure: COLONOSCOPY WITH PROPOFOL ;  Surgeon: Deward CINDERELLA Piedmont, MD;  Location: [facility];  Service: Gastroenterology;  Laterality: N/A;   COLONOSCOPY WITH PROPOFOL  N/A 12/03/2019   Procedure: COLONOSCOPY WITH BIOPSY;  Surgeon: Jinny Carmine, MD;  Location: [facility];  Service: Endoscopy;  Laterality: N/A;   ESOPHAGOGASTRODUODENOSCOPY (EGD) WITH PROPOFOL  N/A 06/25/2021   Procedure: ESOPHAGOGASTRODUODENOSCOPY (EGD) WITH PROPOFOL ;  Surgeon:  Jinny Carmine, MD;  Location: [facility];  Service: Endoscopy;  Laterality: N/A;   POLYPECTOMY N/A 12/03/2019   Procedure: POLYPECTOMY;  Surgeon: Jinny Carmine, MD;  Location: [facility];  Service: Endoscopy;  Laterality: N/A;   PTOSIS REPAIR Bilateral 07/12/2017   Procedure: ptosis repair resect ex;  Surgeon: Ashley Greig HERO, MD;  Location: [facility];  Service: Ophthalmology;  Laterality: Bilateral;  Requests early   TOTAL KNEE ARTHROPLASTY Right 07/11/2023    Procedure: TOTAL KNEE ARTHROPLASTY;  Surgeon: Lorelle Hussar, MD;  Location: [facility] OR[facility];  Service: Orthopedics;  Laterality: Right;   VAGINAL PROLAPSE REPAIR  2014   Dr. Kathe    Allergies  Allergen Reactions   Prednisone     crazy    Prior to Admission medications   Medication Sig Start Date End Date Taking? Authorizing Provider  acetaminophen  (TYLENOL ) 500 MG tablet Take 2 tablets (1,000 mg total) by mouth every 8 (eight) hours. 07/12/23  Yes Charlene Debby BROCKS, PA-C  aspirin  EC 81 MG tablet Take 81 mg by mouth daily.   Yes [provider]  cholecalciferol (VITAMIN D ) 1000 UNITS tablet Take 1,000 Units by mouth daily.   Yes [provider]  Cranberry 450 MG CAPS Take 900 mg by mouth daily. Urinary health supplement   Yes [provider]  diltiazem  (CARDIZEM  CD) 120 MG 24 hr capsule TAKE 1 CAPSULE BY MOUTH EVERY DAY 12/19/23  Yes Gollan, Timothy J, MD  diphenhydramine-acetaminophen  (TYLENOL  PM) 25-500 MG TABS tablet Take 1 tablet by mouth at bedtime as needed.   Yes [provider]  glucosamine-chondroitin 500-400 MG tablet Take 1 tablet by mouth daily.    Yes [provider]  isosorbide  mononitrate (IMDUR ) 30 MG 24 hr tablet TAKE 1 TABLET BY MOUTH AT BEDTIME. 12/19/23  Yes Gollan, Timothy J, MD  levothyroxine  (SYNTHROID ) 50 MCG tablet TAKE 1 TABLET BY MOUTH EVERY DAY 04/19/24  Yes Tullo, Teresa L, MD  losartan  (COZAAR ) 25 MG tablet TAKE 1 TABLET BY MOUTH EVERYDAY AT BEDTIME 12/16/23  Yes Tullo, Teresa L, MD  Multiple Vitamins-Minerals (MULTIVITAMIN WITH MINERALS) tablet Take 1 tablet by mouth daily.   Yes [provider]  Omega-3 Fatty Acids (FISH OIL) 1000 MG CPDR Take 1,200 mg by mouth daily.   Yes [provider]  pantoprazole  (PROTONIX ) 20 MG tablet TAKE 1 TABLET BY MOUTH EVERY DAY 05/28/24  Yes Gollan, Timothy J, MD  rosuvastatin  (CRESTOR ) 10 MG tablet TAKE 1 TABLET BY MOUTH EVERY DAY 04/19/24  Yes Tullo, Teresa L, MD   traZODone  (DESYREL ) 50 MG tablet Take 1 tablet (50 mg total) by mouth at bedtime as needed. for sleep 06/27/24  Yes Marylynn Verneita CROME, MD    Social History   Socioeconomic History   Marital status: Widowed    Spouse name: Not on file   Number of children: 2   Years of education: Not on file   Highest education level: Not on file  Occupational History   Occupation: Retired Engineer, site  Tobacco Use   Smoking status: Former    Types: Cigarettes    Passive exposure: Past   Smokeless tobacco: Never   Tobacco comments:    Smoked x 1 year in her 20's  Vaping Use   Vaping status: Never Used  Substance and Sexual Activity   Alcohol use: No   Drug use: No   Sexual activity: Never  Other Topics Concern   Not on file  Social History Narrative   Daily Caffeine  Use:  One cup coffee in am Regular Exercise -  5 days a week x 1 hour - gym, aerobic and wt training      Left Handed    Lives in a two story home but stays on first floor.    Social Drivers of Corporate investment banker Strain: Low Risk  (07/11/2024)   Received [facility]   Overall Financial Resource Strain (CARDIA)    Difficulty of Paying Living Expenses: Not hard at all  Food Insecurity: No Food Insecurity (07/11/2024)   Received from [facility]   Hunger Vital Sign    Within the past 12 months, you worried that your food would run out before you got the money to buy more.: Never true    Within the past 12 months, the food you bought just didn't last and you didn't have money to get more.: Never true  Transportation Needs: No Transportation Needs (07/11/2024)   Received from [facility] - Transportation    In the past 12 months, has lack of transportation kept you from medical appointments or from getting medications?: No    Lack of Transportation (Non-Medical): No  Physical Activity: Sufficiently Active (09/28/2023)   Exercise Vital Sign    Days of  Exercise per Week: 4 days    Minutes of Exercise per Session: 60 min  Stress: No Stress Concern Present (09/28/2023)   [employer] of Occupational Health - Occupational Stress Questionnaire    Feeling of Stress : Not at all  Social Connections: Moderately Integrated (09/28/2023)   Social Connection and Isolation Panel    Frequency of Communication with Friends and Family: More than three times a week    Frequency of Social Gatherings with Friends and Family: More than three times a week    Attends Religious Services: More than 4 times per year    Active Member of [employer] or Organizations: Yes    Attends Banker Meetings: More than 4 times per year    Marital Status: Widowed  Intimate Partner Violence: Not At Risk (09/28/2023)   Humiliation, Afraid, Rape, and Kick questionnaire    Fear of Current or Ex-Partner: No    Emotionally Abused: No    Physically Abused: No    Sexually Abused: No     Family History  Problem Relation Age of Onset   Breast cancer Mother        92's   Heart attack Mother    Diabetes Mother    Stroke Father    Diabetes Father    Pancreatic cancer Sister    Cancer Sister        pancreatic   Cancer Sister        anal   Breast cancer Sister    Breast cancer Sister    Cancer Brother        liver   Diabetes Brother    Heart attack Brother    Diabetes Brother    Cancer Brother        stomach   Cancer Brother    Diabetes Brother    Colon cancer Neg Hx     ROS: Otherwise negative unless mentioned in HPI  Physical Examination  Vitals:   08/08/24 0710 08/08/24 0800  BP:  (!) 133/54  Pulse:    Resp:  (!) 23  Temp: 98.3 F (36.8 C)   SpO2:     Body mass index is 21.2 kg/m.  General:  WDWN in NAD Gait: Not observed HENT: WNL, normocephalic Pulmonary: normal non-labored breathing, without Rales, rhonchi,  wheezing Cardiac: regular, without  Murmurs, rubs or gallops; without carotid bruits Abdomen: Positive bowel sounds  throughout, soft, NT/ND, no masses Skin: without rashes Vascular Exam/Pulses: Palpable pulses throughout, all extremities are warm to the touch.  Extremities: without ischemic changes, without Gangrene , without cellulitis; without open wounds;  Musculoskeletal: no muscle wasting or atrophy  Neurologic: A&O X 3;  No focal weakness or paresthesias are detected; speech is fluent/normal Psychiatric:  The pt has Normal affect. Lymph:  Unremarkable  CBC    Component Value Date/Time   WBC 12.7 (H) 08/08/2024 0035   RBC 3.43 (L) 08/08/2024 0035   HGB 11.1 (L) 08/08/2024 0035   HGB 11.5 (L) 01/23/2013 0659   HCT 32.8 (L) 08/08/2024 0035   HCT 37.8 01/18/2013 1301   PLT 188 08/08/2024 0035   PLT 301 01/18/2013 1301   MCV 95.6 08/08/2024 0035   MCV 95 01/18/2013 1301   MCH 32.4 08/08/2024 0035   MCHC 33.8 08/08/2024 0035   RDW 12.6 08/08/2024 0035   RDW 12.9 01/18/2013 1301   LYMPHSABS 0.8 08/07/2024 1646   MONOABS 1.2 (H) 08/07/2024 1646   EOSABS 0.0 08/07/2024 1646   BASOSABS 0.1 08/07/2024 1646    BMET    Component Value Date/Time   NA 133 (L) 08/07/2024 1646   NA 139 10/16/2018 0922   NA 141 01/18/2013 1301   K 4.3 08/07/2024 1646   K 4.6 01/18/2013 1301   CL 97 (L) 08/07/2024 1646   CL 106 01/18/2013 1301   CO2 26 08/07/2024 1646   CO2 34 (H) 01/18/2013 1301   GLUCOSE 116 (H) 08/07/2024 1646   GLUCOSE 88 01/18/2013 1301   BUN 18 08/07/2024 1646   BUN 13 10/16/2018 0922   BUN 13 01/18/2013 1301   CREATININE 1.23 (H) 08/08/2024 0035   CREATININE 0.70 01/18/2013 1301   CALCIUM  9.1 08/07/2024 1646   CALCIUM  9.3 01/18/2013 1301   GFRNONAA 44 (L) 08/08/2024 0035   GFRNONAA >60 01/18/2013 1301   GFRAA 75 10/16/2018 0922   GFRAA >60 01/18/2013 1301    COAGS: Lab Results  Component Value Date   INR 1.3 (H) 08/07/2024     Non-Invasive Vascular Imaging:   EXAM:08/07/24 CT HEAD WITHOUT CONTRAST   TECHNIQUE: Contiguous axial images were obtained from the base of  the skull through the vertex without intravenous contrast.   RADIATION DOSE REDUCTION: This exam was performed according to the departmental dose-optimization program which includes automated exposure control, adjustment of the mA and/or kV according to patient size and/or use of iterative reconstruction technique.   COMPARISON:  CT head 11/11/2022   FINDINGS: Brain:   Trace patchy and confluent areas of decreased attenuation are noted throughout the deep and periventricular white matter of the cerebral hemispheres bilaterally, compatible with chronic microvascular ischemic disease. No evidence of large-territorial acute infarction. No parenchymal hemorrhage. No mass lesion. No extra-axial collection.   No mass effect or midline shift. No hydrocephalus. Basilar cisterns are patent.   Vascular: No hyperdense vessel. Atherosclerotic calcifications are present within the cavernous internal carotid arteries.   Skull: No acute fracture or focal lesion.   Sinuses/Orbits: Paranasal sinuses and mastoid air cells are clear. Bilateral lens replacement. Otherwise the orbits are unremarkable.   Other: None.   IMPRESSION:08/07/24 No acute intracranial abnormality.  EXAM:08/08/24 BILATERAL CAROTID DUPLEX ULTRASOUND   TECHNIQUE: Elnor scale imaging, color Doppler and duplex  ultrasound were performed of bilateral carotid and vertebral arteries in the neck.   COMPARISON:  CT cervical spine without contrast 11/11/2022.   FINDINGS: Criteria: Quantification of carotid stenosis is based on velocity parameters that correlate the residual internal carotid diameter with NASCET-based stenosis levels, using the diameter of the distal internal carotid lumen as the denominator for stenosis measurement.   The following velocity measurements were obtained:   RIGHT   ICA: 80 cm/sec   CCA: 78 cm/sec   SYSTOLIC ICA/CCA RATIO:  1.2   ECA: 141 cm/sec   LEFT   ICA: 126 cm/sec   CCA: 86  cm/sec   SYSTOLIC ICA/CCA RATIO:  2.2   ECA: 88 cm/sec   RIGHT CAROTID ARTERY: There is mild nonstenosing intimal thickening in the common carotid artery. Focal shadowing calcific plaque of the carotid bifurcation does encroach on the lumen, but less than 50% narrowing is seen on the gray scale.   RIGHT VERTEBRAL ARTERY:  Flow is antegrade.  PSV is 51 cm/s.   LEFT CAROTID ARTERY: There is mild nonstenosing intimal thickening in the common carotid artery. The left internal carotid artery is asymmetrically tortuous.   There is heterogeneous plaque at the carotid bifurcation which does appear to narrow the lumen at least 50% with grayscale technique at the ECA origin, but there is no visible significant ICA stenosis despite the asymmetric ICA PSV and the ratio over 2.   LEFT VERTEBRAL ARTERY:  Flow is antegrade.  PSV is 45 cm/s.   Upper extremity blood pressures: None taken.   IMPRESSION: 1. Asymmetrically tortuous left internal carotid artery. Heterogeneous plaque at the left carotid bifurcation does appear to narrow the lumen at least 50% with grayscale technique at the ECA origin, but there is no visible significant ICA stenosis despite the asymmetric ICA PSV and the ratio over 2. The numbers alone would technically be consistent with a 50-69% left ICA stenosis. Consider CTA neck. 2. Focal shadowing calcific plaque at the right carotid bifurcation does encroach on the lumen, but less than 50% narrowing is seen on the gray scale. 3. Antegrade flow in both vertebral arteries.   Statin:  Yes.   Beta Blocker:  No. Aspirin :  Yes.   ACEI:  No. ARB:  Yes.   CCB use:  Yes Other antiplatelets/anticoagulants:  No.    ASSESSMENT/PLAN: This is a 83 y.o. female who presents to [facility] emergency department after being seen by her primary care physician in their office today and becoming dizzy.  Patient's daughter who is at the bedside this morning endorses the patients dizziness  started after exercise class and tremors yesterday.  The tremors are mostly involving her left arm and she reports that the patient has difficulty with her balance especially when she stands.  Her balance improves when she sits down.  Patient's daughter also endorses that she has been leaning to the left side recently and that she cannot recall any major accidents or incidents causing this behavior.  Upon workup she is noted to have an elevated white count of 13.5 hemoglobin and hematocrit were normal.  Her creatinine was elevated at 1.26 whereas a month ago her creatinine was normal at 0.87.  7 months ago the patient was noted to have a UTI and urine taken in the emergency department showed moderate leukocytes with some rare bacteria.  This is suggestive she may have a urinary tract infection in looking at some of her blood work she may be dehydrated as well thus causing her  dizziness.  However she was sent for a noncontrasted CT of her head and neck which was consistent with less than 50% stenosis on the right but possibly 50 to 69% of her left internal carotid artery having stenosis.  At this time vascular surgery recommends a CTA of the head and neck to evaluate her vascular system for any significant stenosis that may be related to her current symptoms.  We recommend rehydration as well to correct some orthostatic hypotension.  She is currently on normal saline at 40 cc an hour for the last 24 hours.  We will follow-up after the scan is complete and read to determine possible intervention or follow-up with vein and vascular surgery in clinic.   -I discussed the case in detail with Dr. Selinda Gu MD and he agrees with the plan.   [name] 08/08/2024 8:20 AM

## 2024-08-08 NOTE — ED Notes (Signed)
 Patient and family refused the COVID/flu/RSV swab at this time. Dicky, MD made aware.

## 2024-08-08 NOTE — Plan of Care (Signed)
 Patient was seen and examined at bedside Patient was admitted due to suspected CVA but it has been ruled out, CT head and MRI brain both are negative for any acute findings Carotid duplex shows 50 to 70% left-sided stenosis and 50% stenosis on the right side.  Vascular surgery consulted, recommended CTA head and neck, IV fluid for hydration UTI, patient is on antibiotics, follow renal sonogram and urine culture DC plan tomorrow a.m. if remains stable. No charge note

## 2024-08-08 NOTE — ED Notes (Signed)
 Patient ambulated to the bathroom at this time.

## 2024-08-09 DIAGNOSIS — E559 Vitamin D deficiency, unspecified: Secondary | ICD-10-CM

## 2024-08-09 DIAGNOSIS — N182 Chronic kidney disease, stage 2 (mild): Secondary | ICD-10-CM

## 2024-08-09 DIAGNOSIS — E039 Hypothyroidism, unspecified: Secondary | ICD-10-CM

## 2024-08-09 LAB — CBC
HCT: 30 % — ABNORMAL LOW (ref 36.0–46.0)
Hemoglobin: 10.1 g/dL — ABNORMAL LOW (ref 12.0–15.0)
MCH: 31.5 pg (ref 26.0–34.0)
MCHC: 33.7 g/dL (ref 30.0–36.0)
MCV: 93.5 fL (ref 80.0–100.0)
Platelets: 135 K/uL — ABNORMAL LOW (ref 150–400)
RBC: 3.21 MIL/uL — ABNORMAL LOW (ref 3.87–5.11)
RDW: 12.6 % (ref 11.5–15.5)
WBC: 10.5 K/uL (ref 4.0–10.5)
nRBC: 0 % (ref 0.0–0.2)

## 2024-08-09 LAB — BASIC METABOLIC PANEL WITH GFR
Anion gap: 6 (ref 5–15)
BUN: 12 mg/dL (ref 8–23)
CO2: 26 mmol/L (ref 22–32)
Calcium: 8 mg/dL — ABNORMAL LOW (ref 8.9–10.3)
Chloride: 105 mmol/L (ref 98–111)
Creatinine, Ser: 1.19 mg/dL — ABNORMAL HIGH (ref 0.44–1.00)
GFR, Estimated: 45 mL/min — ABNORMAL LOW (ref >60)
Glucose, Bld: 118 mg/dL — ABNORMAL HIGH (ref 70–99)
Potassium: 3.9 mmol/L (ref 3.5–5.1)
Sodium: 137 mmol/L (ref 135–145)

## 2024-08-09 LAB — MAGNESIUM: Magnesium: 2.5 mg/dL — ABNORMAL HIGH (ref 1.7–2.4)

## 2024-08-09 LAB — URINE CULTURE

## 2024-08-09 LAB — PHOSPHORUS: Phosphorus: 2.9 mg/dL (ref 2.5–4.6)

## 2024-08-09 MED ORDER — CEFUROXIME AXETIL 500 MG PO TABS: 500.0000 mg | ORAL_TABLET | Freq: Two times a day (BID) | ORAL | 0 refills | Status: AC

## 2024-08-09 NOTE — Discharge Summary (Signed)
 Physician Discharge Summary   Patient: [name] MRN: [id number] DOB: [birth year]  Admit date:     08/07/2024  Discharge date: {dischdate:26783}  Discharge Physician: Concepcion Riser   PCP: Marylynn Verneita CROME, MD   Recommendations at discharge:  {Tip this will not be part of the note when signed- Example include specific recommendations for outpatient follow-up, pending tests to follow-up on. (Optional):26781}  ***  Discharge Diagnoses: Principal Problem:   Acute CVA (cerebrovascular accident) (HCC) Active Problems:   Acquired hypothyroidism   Vitamin D  deficiency   CKD (chronic kidney disease), stage II   Acute pyelonephritis   Bilateral carotid artery stenosis  Resolved Problems:   * No resolved hospital problems. [facility] Course: No notes on file  Assessment and Plan: No notes have been filed under this hospital service. Service: Hospitalist     {Tip this will not be part of the note when signed Body mass index is 21.2 kg/m. , ,  (Optional):26781}  {(NOTE) Pain control PDMP Statment (Optional):26782} Consultants: *** Procedures performed: ***  Disposition: {Plan; Disposition:26390} Diet recommendation:  Discharge Diet Orders (From admission, onward)     Start     Ordered   08/09/24 0000  Diet - low sodium heart healthy        08/09/24 1104           {Diet_Plan:26776} DISCHARGE MEDICATION: Allergies as of 08/09/2024       Reactions   Prednisone    crazy        Medication List     TAKE these medications    acetaminophen  500 MG tablet Commonly known as: TYLENOL  Take 2 tablets (1,000 mg total) by mouth every 8 (eight) hours.   aspirin  EC 81 MG tablet Take 81 mg by mouth daily.   cefUROXime  500 MG tablet Commonly known as: CEFTIN  Take 1 tablet (500 mg total) by mouth 2 (two) times daily with a meal for 3 days.   cholecalciferol 1000 units tablet Commonly known as: VITAMIN D  Take 1,000 Units by mouth daily.   Cranberry 450  MG Caps Take 900 mg by mouth daily. Urinary health supplement   diltiazem  120 MG 24 hr capsule Commonly known as: CARDIZEM  CD TAKE 1 CAPSULE BY MOUTH EVERY DAY   diphenhydramine-acetaminophen  25-500 MG Tabs tablet Commonly known as: TYLENOL  PM Take 1 tablet by mouth at bedtime as needed.   Fish Oil 1000 MG Cpdr Take 1,200 mg by mouth daily.   glucosamine-chondroitin 500-400 MG tablet Take 1 tablet by mouth daily.   isosorbide  mononitrate 30 MG 24 hr tablet Commonly known as: IMDUR  TAKE 1 TABLET BY MOUTH AT BEDTIME.   levothyroxine  50 MCG tablet Commonly known as: SYNTHROID  TAKE 1 TABLET BY MOUTH EVERY DAY   losartan  25 MG tablet Commonly known as: COZAAR  TAKE 1 TABLET BY MOUTH EVERYDAY AT BEDTIME   multivitamin with minerals tablet Take 1 tablet by mouth daily.   pantoprazole  20 MG tablet Commonly known as: PROTONIX  TAKE 1 TABLET BY MOUTH EVERY DAY   rosuvastatin  10 MG tablet Commonly known as: CRESTOR  TAKE 1 TABLET BY MOUTH EVERY DAY   traZODone  50 MG tablet Commonly known as: DESYREL  Take 1 tablet (50 mg total) by mouth at bedtime as needed. for sleep        Follow-up Information     Marylynn Verneita CROME, MD Follow up.   Specialty: Internal Medicine Why: hospital follow up Contact information: [address] [place] [address] [phone]  Discharge Exam: Filed Weights   08/07/24 1644  Weight: 61.4 kg   ***  Condition at discharge: {DC Condition:26389}  The results of significant diagnostics from this hospitalization (including imaging, microbiology, ancillary and laboratory) are listed below for reference.   Imaging Studies: CT ANGIO HEAD NECK W WO CM Result Date: 08/08/2024 CLINICAL DATA:  Carotid artery stenosis. EXAM: CT ANGIOGRAPHY HEAD AND NECK WITH AND WITHOUT CONTRAST TECHNIQUE: Multidetector CT imaging of the head and neck was performed using the standard protocol during bolus administration of  intravenous contrast. Multiplanar CT image reconstructions and MIPs were obtained to evaluate the vascular anatomy. Carotid stenosis measurements (when applicable) are obtained utilizing NASCET criteria, using the distal internal carotid diameter as the denominator. RADIATION DOSE REDUCTION: This exam was performed according to the departmental dose-optimization program which includes automated exposure control, adjustment of the mA and/or kV according to patient size and/or use of iterative reconstruction technique. CONTRAST:  50mL OMNIPAQUE  IOHEXOL  350 MG/ML SOLN COMPARISON:  Carotid Doppler ultrasound 08/08/2024. Head CT 08/07/2024 and MRI 08/08/2024. FINDINGS: CT HEAD FINDINGS Brain: There is no evidence of an acute infarct, intracranial hemorrhage, mass, midline shift, or extra-axial fluid collection. Mild cerebral atrophy is within normal limits for age. The ventricles are normal in size. Cerebral white matter hypodensities are nonspecific but compatible with mild chronic small vessel ischemic disease. Vascular: Reported below. Skull: No fracture or suspicious lesion. Sinuses/Orbits: Paranasal sinuses and mastoid air cells are clear. Bilateral cataract extraction. Other: None. Review of the MIP images confirms the above findings CTA NECK FINDINGS Aortic arch: Normal variant aortic arch branching pattern with common origin of the brachiocephalic and left common carotid arteries. Mild atherosclerosis. Patent brachiocephalic and subclavian arteries with less than 50% narrowing of the proximal left subclavian artery due to mixed plaque. Right carotid system: Patent with a small amount of calcified plaque at the carotid bifurcation. No evidence of a dissection or significant stenosis. Mild beading of the mid and distal cervical ICA. Left carotid system: Patent with a small amount of mixed plaque at the carotid bifurcation. No evidence of a dissection or significant stenosis. Moderate beading of the mid cervical  ICA. Vertebral arteries: Patent without evidence of a dissection, stenosis, or significant atherosclerosis. Dominant left vertebral artery. Skeleton: Moderately advanced cervical disc and facet degeneration. Other neck: No evidence of cervical lymphadenopathy or mass. Upper chest: Biapical pleuroparenchymal lung scarring. Review of the MIP images confirms the above findings CTA HEAD FINDINGS Anterior circulation: The internal carotid arteries are patent from skull base to carotid termini with mild atherosclerotic calcification bilaterally not resulting in significant stenosis. ACAs and MCAs are patent without evidence of a proximal branch occlusion or significant proximal stenosis. No aneurysm is identified. Posterior circulation: The intracranial vertebral arteries are widely patent to the basilar. Patent right PICA, left AICA, and right SCA origins are visualized. The basilar artery is widely patent. There are large left and diminutive or absent right posterior communicating arteries with absence of the left P1 segment. Both PCAs are patent without evidence of a significant proximal stenosis. No aneurysm is identified. Venous sinuses: Patent. Anatomic variants: Fetal left PCA. Review of the MIP images confirms the above findings IMPRESSION: 1. Mild atherosclerosis in the head and neck without a large vessel occlusion or significant proximal stenosis. 2. Beading of the left greater than right cervical internal carotid arteries consistent with fibromuscular dysplasia. 3.  Aortic Atherosclerosis (ICD10-I70.0). Electronically Signed   By: Dasie Hamburg M.D.   On: 08/08/2024 12:29  ECHOCARDIOGRAM COMPLETE Result Date: 08/08/2024    ECHOCARDIOGRAM REPORT   Patient Name:   [name] Date of Exam: 08/08/2024 Medical Rec #:  [id number]         Height:       67.0 in Accession #:    [id number]        Weight:       135.4 lb Date of Birth:  Sep 04, 1941          BSA:          1.713 m Patient Age:    83 years          BP:            134/72 mmHg Patient Gender: F                 HR:           86 bpm. Exam Location:  [facility] Procedure: 2D Echo, Cardiac Doppler, Color Doppler and Intracardiac            Opacification Agent (Both Spectral and Color Flow Doppler were            utilized during procedure). Indications:     Stroke I63.9  History:         Patient has no prior history of Echocardiogram examinations.                  Stroke.  Sonographer:     Ashley McNeely-Sloane Referring Phys:  [id number] EMERY LITTIE FUSS Diagnosing Phys: Timothy Gollan MD IMPRESSIONS  1. Left ventricular ejection fraction, by estimation, is 55 to 60%. The left ventricle has normal function. The left ventricle has no regional wall motion abnormalities. Left ventricular diastolic parameters are consistent with Grade I diastolic dysfunction (impaired relaxation).  2. Right ventricular systolic function is normal. The right ventricular size is mildly enlarged. There is normal pulmonary artery systolic pressure. The estimated right ventricular systolic pressure is 22.6 mmHg.  3. The mitral valve is normal in structure. Mild mitral valve regurgitation. No evidence of mitral stenosis.  4. Tricuspid valve regurgitation is mild to moderate.  5. The aortic valve is tricuspid. Aortic valve regurgitation is mild. Aortic valve sclerosis is present, with no evidence of aortic valve stenosis.  6. The inferior vena cava is normal in size with greater than 50% respiratory variability, suggesting right atrial pressure of 3 mmHg. FINDINGS  Left Ventricle: Left ventricular ejection fraction, by estimation, is 55 to 60%. The left ventricle has normal function. The left ventricle has no regional wall motion abnormalities. Definity  contrast agent was given IV to delineate the left ventricular  endocardial borders. Strain was performed and the global longitudinal strain is indeterminate. The left ventricular internal cavity size was normal in size. There is no left ventricular hypertrophy.  Left ventricular diastolic parameters are consistent with Grade I diastolic dysfunction (impaired relaxation). Right Ventricle: The right ventricular size is mildly enlarged. No increase in right ventricular wall thickness. Right ventricular systolic function is normal. There is normal pulmonary artery systolic pressure. The tricuspid regurgitant velocity is 2.10  m/s, and with an assumed right atrial pressure of 5 mmHg, the estimated right ventricular systolic pressure is 22.6 mmHg. Left Atrium: Left atrial size was normal in size. Right Atrium: Right atrial size was normal in size. Pericardium: There is no evidence of pericardial effusion. Mitral Valve: The mitral valve is normal in structure. Mild mitral annular calcification. Mild mitral valve regurgitation. No evidence of mitral valve stenosis. MV  peak gradient, 4.8 mmHg. The mean mitral valve gradient is 2.0 mmHg. Tricuspid Valve: The tricuspid valve is normal in structure. Tricuspid valve regurgitation is mild to moderate. No evidence of tricuspid stenosis. Aortic Valve: The aortic valve is tricuspid. Aortic valve regurgitation is mild. Aortic valve sclerosis is present, with no evidence of aortic valve stenosis. Aortic valve mean gradient measures 5.0 mmHg. Aortic valve peak gradient measures 9.9 mmHg. Aortic valve area, by VTI measures 2.29 cm. Pulmonic Valve: The pulmonic valve was normal in structure. Pulmonic valve regurgitation is not visualized. No evidence of pulmonic stenosis. Aorta: The aortic root is normal in size and structure. Venous: The inferior vena cava is normal in size with greater than 50% respiratory variability, suggesting right atrial pressure of 3 mmHg. IAS/Shunts: No atrial level shunt detected by color flow Doppler. Additional Comments: 3D was performed not requiring image post processing on an independent workstation and was indeterminate.  LEFT VENTRICLE PLAX 2D LVIDd:         4.50 cm     Diastology LVIDs:         2.80 cm     LV  e' medial:    9.03 cm/s LV PW:         1.00 cm     LV E/e' medial:  8.3 LV IVS:        1.00 cm     LV e' lateral:   10.20 cm/s LVOT diam:     2.00 cm     LV E/e' lateral: 7.3 LV SV:         68 LV SV Index:   39 LVOT Area:     3.14 cm  LV Volumes (MOD) LV vol d, MOD A2C: 48.6 ml LV vol d, MOD A4C: 92.0 ml LV vol s, MOD A2C: 25.7 ml LV vol s, MOD A4C: 44.3 ml LV SV MOD A2C:     22.9 ml LV SV MOD A4C:     92.0 ml LV SV MOD BP:      33.9 ml RIGHT VENTRICLE             IVC RV Basal diam:  2.90 cm     IVC diam: 1.20 cm RV Mid diam:    2.90 cm RV S prime:     12.10 cm/s TAPSE (M-mode): 2.2 cm LEFT ATRIUM           Index        RIGHT ATRIUM           Index LA diam:      2.70 cm 1.58 cm/m   RA Area:     15.90 cm LA Vol (A2C): 48.1 ml 28.08 ml/m  RA Volume:   42.85 ml  25.01 ml/m LA Vol (A4C): 51.9 ml 30.27 ml/m  AORTIC VALVE                     PULMONIC VALVE AV Area (Vmax):    2.08 cm      PV Vmax:        1.00 m/s AV Area (Vmean):   2.15 cm      PV Vmean:       73.100 cm/s AV Area (VTI):     2.29 cm      PV VTI:         0.181 m AV Vmax:           157.00 cm/s   PV Peak grad:   4.0 mmHg AV Vmean:  106.000 cm/s  PV Mean grad:   2.0 mmHg AV VTI:            0.295 m       RVOT Peak grad: 2 mmHg AV Peak Grad:      9.9 mmHg AV Mean Grad:      5.0 mmHg LVOT Vmax:         104.00 cm/s LVOT Vmean:        72.700 cm/s LVOT VTI:          0.215 m LVOT/AV VTI ratio: 0.73  AORTA Ao Root diam: 2.90 cm Ao Asc diam:  2.50 cm MITRAL VALVE                TRICUSPID VALVE MV Area (PHT): 3.91 cm     TR Peak grad:   17.6 mmHg MV Area VTI:   3.07 cm     TR Vmax:        210.00 cm/s MV Peak grad:  4.8 mmHg MV Mean grad:  2.0 mmHg     SHUNTS MV Vmax:       1.09 m/s     Systemic VTI:  0.22 m MV Vmean:      66.4 cm/s    Systemic Diam: 2.00 cm MV Decel Time: 194 msec     Pulmonic VTI:  0.152 m MV E velocity: 74.50 cm/s MV A velocity: 121.00 cm/s MV E/A ratio:  0.62 Evalene Lunger MD Electronically signed by Evalene Lunger MD Signature  Date/Time: 08/08/2024/11:01:34 AM    Final    US  RENAL Result Date: 08/08/2024 CLINICAL DATA:  Urinary tract infection. EXAM: RENAL / URINARY TRACT ULTRASOUND COMPLETE COMPARISON:  August 07, 2024.  August 25, 2022. FINDINGS: Right Kidney: Renal measurements: 12.3 x 4.9 x 4.8 cm = volume: 150 mL. Echogenicity within normal limits. No mass or hydronephrosis visualized. Left Kidney: Renal measurements: 10.9 x 5.8 x 4.3 cm = volume: 143 mL. Echogenicity within normal limits. No mass or hydronephrosis visualized. Bladder: Appears normal for degree of bladder distention. Calculated prevoid volume of 31 mL. Calculated postvoid volume of 18 mL. Other: Small amount of right perinephric fluid is noted. IMPRESSION: No significant renal abnormality is noted. Small amount of right perinephric fluid is noted. Electronically Signed   By: Lynwood Landy Raddle M.D.   On: 08/08/2024 09:42   US  Carotid Bilateral (at [facility] and [facility] only) Result Date: 08/08/2024 CLINICAL DATA:  [id number].  Cerebrovascular accident. EXAM: BILATERAL CAROTID DUPLEX ULTRASOUND TECHNIQUE: Elnor scale imaging, color Doppler and duplex ultrasound were performed of bilateral carotid and vertebral arteries in the neck. COMPARISON:  CT cervical spine without contrast 11/11/2022. FINDINGS: Criteria: Quantification of carotid stenosis is based on velocity parameters that correlate the residual internal carotid diameter with NASCET-based stenosis levels, using the diameter of the distal internal carotid lumen as the denominator for stenosis measurement. The following velocity measurements were obtained: RIGHT ICA: 80 cm/sec CCA: 78 cm/sec SYSTOLIC ICA/CCA RATIO:  1.2 ECA: 141 cm/sec LEFT ICA: 126 cm/sec CCA: 86 cm/sec SYSTOLIC ICA/CCA RATIO:  2.2 ECA: 88 cm/sec RIGHT CAROTID ARTERY: There is mild nonstenosing intimal thickening in the common carotid artery. Focal shadowing calcific plaque of the carotid bifurcation does encroach on the lumen, but less than 50%  narrowing is seen on the gray scale. RIGHT VERTEBRAL ARTERY:  Flow is antegrade.  PSV is 51 cm/s. LEFT CAROTID ARTERY: There is mild nonstenosing intimal thickening in the common carotid artery. The left internal carotid artery is asymmetrically tortuous.  There is heterogeneous plaque at the carotid bifurcation which does appear to narrow the lumen at least 50% with grayscale technique at the ECA origin, but there is no visible significant ICA stenosis despite the asymmetric ICA PSV and the ratio over 2. LEFT VERTEBRAL ARTERY:  Flow is antegrade.  PSV is 45 cm/s. Upper extremity blood pressures: None taken. IMPRESSION: 1. Asymmetrically tortuous left internal carotid artery. Heterogeneous plaque at the left carotid bifurcation does appear to narrow the lumen at least 50% with grayscale technique at the ECA origin, but there is no visible significant ICA stenosis despite the asymmetric ICA PSV and the ratio over 2. The numbers alone would technically be consistent with a 50-69% left ICA stenosis. Consider CTA neck. 2. Focal shadowing calcific plaque at the right carotid bifurcation does encroach on the lumen, but less than 50% narrowing is seen on the gray scale. 3. Antegrade flow in both vertebral arteries. Electronically Signed   By: Francis Quam M.D.   On: 08/08/2024 05:44   MR BRAIN WO CONTRAST Result Date: 08/08/2024 CLINICAL DATA:  Neuro deficit, acute, stroke suspected EXAM: MRI HEAD WITHOUT CONTRAST TECHNIQUE: Multiplanar, multiecho pulse sequences of the brain and surrounding structures were obtained without intravenous contrast. COMPARISON:  CT head 08/07/2024. FINDINGS: Brain: No acute infarction, hemorrhage, hydrocephalus, extra-axial collection or mass lesion. Vascular: Normal flow voids. Skull and upper cervical spine: Normal marrow signal. Sinuses/Orbits: Negative. Other: No mastoid effusions. IMPRESSION: No evidence of acute intracranial abnormality. Electronically Signed   By: Gilmore GORMAN Molt  M.D.   On: 08/08/2024 03:49   CT ABDOMEN PELVIS WO CONTRAST Result Date: 08/07/2024 CLINICAL DATA:  Abdominal pain and cramping for 2 days, initial encounter EXAM: CT ABDOMEN AND PELVIS WITHOUT CONTRAST TECHNIQUE: Multidetector CT imaging of the abdomen and pelvis was performed following the standard protocol without IV contrast. RADIATION DOSE REDUCTION: This exam was performed according to the departmental dose-optimization program which includes automated exposure control, adjustment of the mA and/or kV according to patient size and/or use of iterative reconstruction technique. COMPARISON:  06/04/2015 FINDINGS: Lower chest: No acute abnormality. Hepatobiliary: No focal liver abnormality is seen. No gallstones, gallbladder wall thickening, or biliary dilatation. Pancreas: Unremarkable. No pancreatic ductal dilatation or surrounding inflammatory changes. Spleen: Normal in size without focal abnormality. Adrenals/Urinary Tract: Adrenal glands are within normal limits. Kidneys are well visualized bilaterally. Left kidney appears within normal limits. The right kidney demonstrates mild hydronephrosis proximally. The more distal ureter is within normal limits. The bladder is partially distended. Some thickening of the renal pelvic wall is noted which may represent underlying UTI. Stomach/Bowel: Scattered diverticular change of the colon is noted without evidence of diverticulitis. The appendix appears within normal limits. Small bowel and stomach are unremarkable. Vascular/Lymphatic: Aortic atherosclerosis. No enlarged abdominal or pelvic lymph nodes. Reproductive: Status post hysterectomy. No adnexal masses. Other: No abdominal wall hernia or abnormality. No abdominopelvic ascites. Musculoskeletal: Degenerative changes of lumbar spine. IMPRESSION: Right-sided hydronephrosis with pelvic wall thickening suspicious for UTI. No definitive stone is seen. Diverticulosis without diverticulitis. Electronically Signed   By:  Oneil Devonshire M.D.   On: 08/07/2024 22:43   CT HEAD WO CONTRAST Result Date: 08/07/2024 CLINICAL DATA:  Neuro deficit, acute, stroke suspected At exersize class yesterday felt dizzy. EXAM: CT HEAD WITHOUT CONTRAST TECHNIQUE: Contiguous axial images were obtained from the base of the skull through the vertex without intravenous contrast. RADIATION DOSE REDUCTION: This exam was performed according to the departmental dose-optimization program which includes automated exposure control, adjustment of  the mA and/or kV according to patient size and/or use of iterative reconstruction technique. COMPARISON:  CT head 11/11/2022 FINDINGS: Brain: Trace patchy and confluent areas of decreased attenuation are noted throughout the deep and periventricular white matter of the cerebral hemispheres bilaterally, compatible with chronic microvascular ischemic disease. No evidence of large-territorial acute infarction. No parenchymal hemorrhage. No mass lesion. No extra-axial collection. No mass effect or midline shift. No hydrocephalus. Basilar cisterns are patent. Vascular: No hyperdense vessel. Atherosclerotic calcifications are present within the cavernous internal carotid arteries. Skull: No acute fracture or focal lesion. Sinuses/Orbits: Paranasal sinuses and mastoid air cells are clear. Bilateral lens replacement. Otherwise the orbits are unremarkable. Other: None. IMPRESSION: No acute intracranial abnormality. Electronically Signed   By: Morgane  Naveau M.D.   On: 08/07/2024 18:01    Microbiology: Results for orders placed or performed during the hospital encounter of 08/07/24  Urine Culture     Status: Abnormal   Collection Time: 08/07/24 11:11 PM   Specimen: Urine, Clean Catch  Result Value Ref Range Status   Specimen Description   Final    URINE, CLEAN CATCH Performed at [facility], [address] [place], [place] [address]    Special Requests   Final    NONE Performed at [facility],  [address] [place], [place] [address]    Culture MULTIPLE SPECIES PRESENT, SUGGEST RECOLLECTION (A)  Final   Report Status 08/09/2024 FINAL  Final  Resp panel by RT-PCR (RSV, Flu A&B, Covid) Anterior Nasal Swab     Status: None   Collection Time: 08/08/24  8:01 AM   Specimen: Anterior Nasal Swab  Result Value Ref Range Status   SARS Coronavirus 2 by RT PCR NEGATIVE NEGATIVE Final    Comment: (NOTE) SARS-CoV-2 target nucleic acids are NOT DETECTED.  The SARS-CoV-2 RNA is generally detectable in upper respiratory specimens during the acute phase of infection. The lowest concentration of SARS-CoV-2 viral copies this assay can detect is 138 copies/mL. A negative result does not preclude SARS-Cov-2 infection and should not be used as the sole basis for treatment or other patient management decisions. A negative result may occur with  improper specimen collection/handling, submission of specimen other than nasopharyngeal swab, presence of viral mutation(s) within the areas targeted by this assay, and inadequate number of viral copies(<138 copies/mL). A negative result must be combined with clinical observations, patient history, and epidemiological information. The expected result is Negative.  Fact Sheet for Patients:  BloggerCourse.com  Fact Sheet for Healthcare Providers:  SeriousBroker.it  This test is no t yet approved or cleared by the [place]  FDA and  has been authorized for detection and/or diagnosis of SARS-CoV-2 by FDA under an Emergency Use Authorization (EUA). This EUA will remain  in effect (meaning this test can be used) for the duration of the COVID-19 declaration under Section 564(b)(1) of the Act, 21 U.S.C.section 360bbb-3(b)(1), unless the authorization is terminated  or revoked sooner.       Influenza A by PCR NEGATIVE NEGATIVE Final   Influenza B by PCR NEGATIVE NEGATIVE Final    Comment: (NOTE) The  Xpert Xpress SARS-CoV-2/FLU/RSV plus assay is intended as an aid in the diagnosis of influenza from Nasopharyngeal swab specimens and should not be used as a sole basis for treatment. Nasal washings and aspirates are unacceptable for Xpert Xpress SARS-CoV-2/FLU/RSV testing.  Fact Sheet for Patients: BloggerCourse.com  Fact Sheet for Healthcare Providers: SeriousBroker.it  This test is not yet approved or cleared by the  [place]  FDA and has been authorized for detection and/or diagnosis of SARS-CoV-2 by FDA under an Emergency Use Authorization (EUA). This EUA will remain in effect (meaning this test can be used) for the duration of the COVID-19 declaration under Section 564(b)(1) of the Act, 21 U.S.C. section 360bbb-3(b)(1), unless the authorization is terminated or revoked.     Resp Syncytial Virus by PCR NEGATIVE NEGATIVE Final    Comment: (NOTE) Fact Sheet for Patients: BloggerCourse.com  Fact Sheet for Healthcare Providers: SeriousBroker.it  This test is not yet approved or cleared by the [place]  FDA and has been authorized for detection and/or diagnosis of SARS-CoV-2 by FDA under an Emergency Use Authorization (EUA). This EUA will remain in effect (meaning this test can be used) for the duration of the COVID-19 declaration under Section 564(b)(1) of the Act, 21 U.S.C. section 360bbb-3(b)(1), unless the authorization is terminated or revoked.  Performed at [facility], [address] [place], [place] [address]     Labs: CBC: Recent Labs  Lab 08/07/24 1646 08/08/24 0035 08/09/24 0517  WBC 13.5* 12.7* 10.5  NEUTROABS 11.5*  --   --   HGB 11.9* 11.1* 10.1*  HCT 35.0* 32.8* 30.0*  MCV 94.6 95.6 93.5  PLT 203 188 135*   Basic Metabolic Panel: Recent Labs  Lab 08/07/24 1646 08/08/24 0035 08/09/24 0517  NA 133*  --  137  K 4.3  --  3.9   CL 97*  --  105  CO2 26  --  26  GLUCOSE 116*  --  118*  BUN 18  --  12  CREATININE 1.26* 1.23* 1.19*  CALCIUM  9.1  --  8.0*  MG  --  2.4 2.5*  PHOS  --  2.9 2.9   Liver Function Tests: Recent Labs  Lab 08/07/24 1646  AST 20  ALT 14  ALKPHOS 41  BILITOT 0.8  PROT 7.2  ALBUMIN 3.6   CBG: No results for input(s): GLUCAP in the last 168 hours.  Discharge time spent: {LESS THAN/GREATER UYJW:73611} 30 minutes.  Signed: Concepcion Riser, MD [facility] 08/09/2024

## 2024-08-09 NOTE — Progress Notes (Signed)
 Progress Note    08/09/2024 10:50 AM * No surgery found *  Subjective:  [name] is an 83 yo female with medical history significant of hypothyroidism, abdominal aortic aneurysm, basal cell carcinoma, recurrent UTI, essential hypertension, hyperlipidemia, Raynaud's phenomena from history of syncope, vitamin D  deficiency, chronic low back pain who presented to the ER after going to her primary care physician's office today with complaint of dizziness that started yesterday in an exercise class. She also had tremors. Patient was admitted for CVA/TIA . Carotid duplex Ultrasounds were completed showing 50-70% stenosis on the left and less than 50% stenosis on the right. Both CT Head and MRI were negative for any acute findings of CVA. Patient then underwent CTA Head and neck that was negative for any significant stenosis in either carotid artery.   Patient rests comfortably in the bedside chair today. Daughter is at her side. She has no neurologic symptoms suggestive of TIA/CVA today. No complaints and vitals all remain stable.      Vitals:   08/09/24 0337 08/09/24 0837  BP: (!) 121/59 (!) 129/48  Pulse: 70 71  Resp: 16 18  Temp: 98.5 F (36.9 C) (!) 97.5 F (36.4 C)  SpO2: 93% 95%   Physical Exam: Cardiac:  RRR, Normal S1, S2. No murmurs Lungs:  Clear throughout on auscultation. No rales rhonchi or wheezing.  Incisions:  None Extremities:  palpable pulses throughout, 5/5 motor strength  Abdomen:  Positive bowel sounds throughout, soft, non tender and non distended.  Neurologic: AAOX3 answers all questions and follows commands appropriately.   CBC    Component Value Date/Time   WBC 10.5 08/09/2024 0517   RBC 3.21 (L) 08/09/2024 0517   HGB 10.1 (L) 08/09/2024 0517   HGB 11.5 (L) 01/23/2013 0659   HCT 30.0 (L) 08/09/2024 0517   HCT 37.8 01/18/2013 1301   PLT 135 (L) 08/09/2024 0517   PLT 301 01/18/2013 1301   MCV 93.5 08/09/2024 0517   MCV 95 01/18/2013 1301   MCH 31.5  08/09/2024 0517   MCHC 33.7 08/09/2024 0517   RDW 12.6 08/09/2024 0517   RDW 12.9 01/18/2013 1301   LYMPHSABS 0.8 08/07/2024 1646   MONOABS 1.2 (H) 08/07/2024 1646   EOSABS 0.0 08/07/2024 1646   BASOSABS 0.1 08/07/2024 1646    BMET    Component Value Date/Time   NA 137 08/09/2024 0517   NA 139 10/16/2018 0922   NA 141 01/18/2013 1301   K 3.9 08/09/2024 0517   K 4.6 01/18/2013 1301   CL 105 08/09/2024 0517   CL 106 01/18/2013 1301   CO2 26 08/09/2024 0517   CO2 34 (H) 01/18/2013 1301   GLUCOSE 118 (H) 08/09/2024 0517   GLUCOSE 88 01/18/2013 1301   BUN 12 08/09/2024 0517   BUN 13 10/16/2018 0922   BUN 13 01/18/2013 1301   CREATININE 1.19 (H) 08/09/2024 0517   CREATININE 0.70 01/18/2013 1301   CALCIUM  8.0 (L) 08/09/2024 0517   CALCIUM  9.3 01/18/2013 1301   GFRNONAA 45 (L) 08/09/2024 0517   GFRNONAA >60 01/18/2013 1301   GFRAA 75 10/16/2018 0922   GFRAA >60 01/18/2013 1301    INR    Component Value Date/Time   INR 1.3 (H) 08/07/2024 1646     Intake/Output Summary (Last 24 hours) at 08/09/2024 1050 Last data filed at 08/09/2024 0249 Gross per 24 hour  Intake 240 ml  Output 1 ml  Net 239 ml     Assessment/Plan:  83 y.o. female who  was admitted to [facility] for possible TIA/CVA . Patient was noted to have negative CT and MRI of the head. Negative CTA of the head and neck for carotid disease. Therefore I do not feel her symptoms are related to any vascular disease and does not require any intervention at this time.  * No surgery found *   PLAN Patient to follow up with Vein and Vascular Surgery in 3 months for ultrasounds of her carotid arteries.    DVT prophylaxis:  Per Neurology. Vascular Surgery is not requiring the patient to be on any antiplatelet or anticoagulation therapy at this time.    Gwendlyn JONELLE Shank [facility] 08/09/2024 10:50 AM

## 2024-08-09 NOTE — Plan of Care (Signed)
  Problem: Education: Goal: Knowledge of patient specific risk factors will improve (DELETE if not current risk factor) Outcome: Progressing   Problem: Coping: Goal: Will verbalize positive feelings about self Outcome: Progressing   Problem: Health Behavior/Discharge Planning: Goal: Ability to manage health-related needs will improve Outcome: Progressing   Problem: Self-Care: Goal: Verbalization of feelings and concerns over difficulty with self-care will improve Outcome: Progressing

## 2024-08-10 ENCOUNTER — Telehealth: Payer: Self-pay

## 2024-08-10 NOTE — Transitions of Care (Post Inpatient/ED Visit) (Signed)
   08/10/2024  Name: [name] MRN: [id number] DOB: Feb 08, 1941  Today's TOC FU Call Status: Today's TOC FU Call Status:: Unsuccessful Call (1st Attempt) Unsuccessful Call (1st Attempt) Date: 08/10/24  Attempted to reach the patient regarding the most recent Inpatient/ED visit.  Follow Up Plan: Additional outreach attempts will be made to reach the patient to complete the Transitions of Care (Post Inpatient/ED visit) call.   Medford Balboa, BSN, RN [place]  [facility] - [employer] [facility] RN Care Manager [phone]

## 2024-08-13 ENCOUNTER — Telehealth: Payer: Self-pay

## 2024-08-13 NOTE — Transitions of Care (Post Inpatient/ED Visit) (Signed)
   08/13/2024  Name: [name] MRN: [id number] DOB: 06-21-1941  Today's TOC FU Call Status: Today's TOC FU Call Status:: Unsuccessful Call (2nd Attempt) Unsuccessful Call (1st Attempt) Date: 08/10/24 Unsuccessful Call (2nd Attempt) Date: 08/13/24  Attempted to reach the patient regarding the most recent Inpatient/ED visit.  Follow Up Plan: Additional outreach attempts will be made to reach the patient to complete the Transitions of Care (Post Inpatient/ED visit) call.   Medford Balboa, BSN, RN [place]  [facility]BC[facility] - [employer] [facility] RN Care Manager [phone]

## 2024-08-13 NOTE — Transitions of Care (Post Inpatient/ED Visit) (Signed)
 08/13/2024  Name: [name] MRN: [id number] DOB: 08-29-1941  Today's TOC FU Call Status: Today's TOC FU Call Status:: Successful TOC FU Call Completed TOC FU Call Complete Date: 08/13/24 Patient's Name and Date of Birth confirmed.  Transition Care Management Follow-up Telephone Call Date of Discharge: 08/09/24 Discharge Facility: [facility]) Type of Discharge: Inpatient Admission Primary Inpatient Discharge Diagnosis:: Dizziness How have you been since you were released from the hospital?: Better Any questions or concerns?: No  Items Reviewed: Did you receive and understand the discharge instructions provided?: Yes Medications obtained,verified, and reconciled?: Partial Review Completed Reason for Partial Mediation Review: The patient states everything is the same except one medication Any new allergies since your discharge?: No Dietary orders reviewed?: NA Do you have support at home?: Yes People in Home [RPT]: alone Name of Support/Comfort Primary Source: Kate Marina  Medications Reviewed Today: Medications Reviewed Today     Reviewed by Moises Reusing, RN (Case Manager) on 08/13/24 at 1547  Med List Status: <None>   Medication Order Taking? Sig Documenting Provider Last Dose Status Informant  acetaminophen  (TYLENOL ) 500 MG tablet [id number] No Take 2 tablets (1,000 mg total) by mouth every 8 (eight) hours. Charlene Debby BROCKS, PA-C Unknown Active Self  aspirin  EC 81 MG tablet [id number] No Take 81 mg by mouth daily. [provider] 08/06/2024 Active Self  cholecalciferol (VITAMIN D ) 1000 UNITS tablet [id number] No Take 1,000 Units by mouth daily. [provider] 08/07/2024 Morning Active Self  Cranberry 450 MG CAPS [id number] No Take 900 mg by mouth daily. Urinary health supplement [provider] 08/07/2024 Morning Active Self  diltiazem  (CARDIZEM  CD) 120 MG 24 hr capsule [id number] No TAKE 1 CAPSULE BY MOUTH EVERY DAY  Gollan, Timothy J, MD 08/07/2024 Morning Active Self  diphenhydramine-acetaminophen  (TYLENOL  PM) 25-500 MG TABS tablet [id number] No Take 1 tablet by mouth at bedtime as needed. [provider] Unknown Active Self  glucosamine-chondroitin 500-400 MG tablet [id number] No Take 1 tablet by mouth daily.  [provider] 08/07/2024 Morning Active Self           Med Note OREN LAYMON LOISE Pablo Dec 22, 2020 11:40 AM)    isosorbide  mononitrate (IMDUR ) 30 MG 24 hr tablet [id number] No TAKE 1 TABLET BY MOUTH AT BEDTIME. Gollan, Timothy J, MD 08/06/2024 Active Self  levothyroxine  (SYNTHROID ) 50 MCG tablet [id number] No TAKE 1 TABLET BY MOUTH EVERY DAY Tullo, Teresa L, MD 08/07/2024 Morning Active Self  losartan  (COZAAR ) 25 MG tablet [id number] No TAKE 1 TABLET BY MOUTH EVERYDAY AT BEDTIME Marylynn Verneita CROME, MD 08/06/2024 Active Self  Multiple Vitamins-Minerals (MULTIVITAMIN WITH MINERALS) tablet [id number] No Take 1 tablet by mouth daily. [provider] 08/07/2024 Morning Active Self  Omega-3 Fatty Acids (FISH OIL) 1000 MG CPDR [id number] No Take 1,200 mg by mouth daily. [provider] 08/07/2024 Morning Active Self  pantoprazole  (PROTONIX ) 20 MG tablet [id number] No TAKE 1 TABLET BY MOUTH EVERY DAY Gollan, Timothy J, MD 08/07/2024 Morning Active Self  rosuvastatin  (CRESTOR ) 10 MG tablet [id number] No TAKE 1 TABLET BY MOUTH EVERY DAY Tullo, Teresa L, MD 08/06/2024 Active Self  traZODone  (DESYREL ) 50 MG tablet [id number] No Take 1 tablet (50 mg total) by mouth at bedtime as needed. for sleep Marylynn Verneita CROME, MD 08/06/2024 Active Self            Home Care and Equipment/Supplies: Were Home Health Services Ordered?: NA Any new equipment or medical supplies ordered?: NA  Functional Questionnaire: Do you need assistance with bathing/showering or dressing?: No Do you need assistance with meal preparation?: No Do you need assistance with eating?: No Do you have difficulty maintaining  continence: No Do you need assistance with getting out of bed/getting out of a chair/moving?: No Do you have difficulty managing or taking your medications?: No  Follow up appointments reviewed: PCP Follow-up appointment confirmed?: No (The patient states her daugther is going to make the appointment) MD Provider Line Number:[phone] Given: No Specialist Hospital Follow-up appointment confirmed?: NA Do you need transportation to your follow-up appointment?: No Do you understand care options if your condition(s) worsen?: Yes-patient verbalized understanding  SDOH Interventions Today    Flowsheet Row Most Recent Value  SDOH Interventions   Food Insecurity Interventions Intervention Not Indicated  Housing Interventions Intervention Not Indicated  Transportation Interventions Intervention Not Indicated  Utilities Interventions Intervention Not Indicated    Medford Balboa, BSN, RN [place]  VBCI - Population Health RN Care Manager [phone]

## 2024-08-20 ENCOUNTER — Encounter: Payer: Self-pay | Admitting: Internal Medicine

## 2024-08-20 ENCOUNTER — Ambulatory Visit (INDEPENDENT_AMBULATORY_CARE_PROVIDER_SITE_OTHER): Admitting: Internal Medicine

## 2024-08-20 VITALS — BP 148/90 | HR 67 | Temp 97.8°F | Ht 67.0 in | Wt 135.0 lb

## 2024-08-20 DIAGNOSIS — I6523 Occlusion and stenosis of bilateral carotid arteries: Secondary | ICD-10-CM

## 2024-08-20 DIAGNOSIS — I129 Hypertensive chronic kidney disease with stage 1 through stage 4 chronic kidney disease, or unspecified chronic kidney disease: Secondary | ICD-10-CM | POA: Diagnosis not present

## 2024-08-20 DIAGNOSIS — R4586 Emotional lability: Secondary | ICD-10-CM | POA: Diagnosis not present

## 2024-08-20 DIAGNOSIS — N179 Acute kidney failure, unspecified: Secondary | ICD-10-CM | POA: Diagnosis not present

## 2024-08-20 DIAGNOSIS — R808 Other proteinuria: Secondary | ICD-10-CM | POA: Diagnosis not present

## 2024-08-20 DIAGNOSIS — N182 Chronic kidney disease, stage 2 (mild): Secondary | ICD-10-CM | POA: Diagnosis not present

## 2024-08-20 DIAGNOSIS — R42 Dizziness and giddiness: Secondary | ICD-10-CM

## 2024-08-20 NOTE — Assessment & Plan Note (Signed)
-   Patient had AKI on CKD in the hospital with creatinine elevation up to 1.26 from a baseline of 0.8-0.9.  Creatinine had improved to 1.19 on discharge -She saw nephrology today and had blood work done.  Will follow-up repeat BMP -No further workup at this time

## 2024-08-20 NOTE — Assessment & Plan Note (Signed)
-   Patient noted to have an elevated PHQ-9 and GAD-7 score today -She attributes this to not feeling better after her recent hospitalization and not being able to do the activities that she normally does -I suspect this is a transient mood disturbance secondary to her ongoing medical issues which should resolve as her medical issues improve -Patient will discuss with us  if this persists

## 2024-08-20 NOTE — Patient Instructions (Signed)
  VISIT SUMMARY: Today, we discussed your dizziness, ear symptoms, and recent health issues. You have been experiencing vertigo, visual disturbances, and auditory symptoms, along with chills, tremors, and altered awareness. We reviewed your recent hospital evaluations and treatments, including your recovery from a urinary tract infection and antibiotic-associated diarrhea.  YOUR PLAN: -BENIGN PAROXYSMAL POSITIONAL VERTIGO (BPPV): BPPV is a condition where small crystals in your inner ear become dislodged, causing dizziness with head movements. We recommend vestibular physical therapy to help with head exercises that can alleviate your symptoms. Please continue with your ENT evaluation for your ear symptoms.  -ACUTE KIDNEY INJURY, IMPROVING: Your recent kidney injury was likely due to dehydration from a urinary tract infection. Your kidney function was improving at the time of hospital discharge. We will check your recent kidney function tests to ensure continued improvement.  -RECENT URINARY TRACT INFECTION, TREATED: You were recently treated for a urinary tract infection, which likely caused your chills and tremors. These symptoms, along with any cognitive changes, may have been influenced by the infection.  -ANTIBIOTIC-ASSOCIATED DIARRHEA, IMPROVING: You experienced diarrhea after taking antibiotics for your urinary tract infection. The symptoms have improved over the past two days.  -GENERAL HEALTH MAINTENANCE: Routine health maintenance and preventative care measures were not discussed in detail during this visit.  INSTRUCTIONS: Please complete the vestibular physical therapy referral and attend your ENT appointment as scheduled. Follow up with Dr. Tobie in neurology as needed for cognitive symptoms. Monitor your kidney function results from the recent urologist visit.   I have put in a referral to vestibular therapy for you.  Please expect a call from them to get scheduled for  this                   Contains text generated by Abridge.                                 Contains text generated by Abridge.

## 2024-08-20 NOTE — Progress Notes (Signed)
 Acute Office Visit  Subjective:     Patient ID: [name], female    DOB: 01-03-1941, 83 y.o.   MRN: [id number]  Chief Complaint  Patient presents with   Hospitalization Follow-up    Had a Stroke    Discussed the use of AI scribe software for clinical note transcription with the patient, who gave verbal consent to proceed.  History of Present Illness [name] is an 83 year old female who presents with dizziness and ear symptoms.  Vertigo and visual disturbances - Dizziness characterized by a spinning sensation, particularly when turning head to the left - Onset prior to recent hospital visit, initially triggered during exercise - Accompanied by visual disturbances, including seeing black specks initially - Patient states that she has these episodes when she turns her head to the left but has no episodes when she keeps her head straight.  Episodes last a few seconds at a time and then resolved.  Auditory symptoms - Persistent roaring sound in both ears since a fall in December 2024 or January 2025 - Worsening of roaring sound over time - Avoids wearing hearing aids due to bothersome auditory symptoms  Chills, tremors, and altered awareness - Episodes of chills and tremors prior to going to hospital where she was diagnosed with urinary tract infection -These have now resolved  Recent infectious symptoms and antibiotic use - Treated with antibiotics for urinary tract infection during recent hospital stay - Diarrhea following antibiotic treatment, now improved  Peripheral neuropathy - History of neuropathy with prior neurology evaluation - Per daughter, patient does have episodes where she is staring and not paying attention to the conversation.   - They are unsure if this is cognitive but patient states that she has not been wearing her hearing aids and this may be secondary to not hearing a conversation rather than not responding to it  Recent hospital  evaluation - CT scan, MRI, and CT angiography of the brain performed during recent hospitalization which showed no evidence of an acute CVA  Medications were reviewed with the patient  Review of Systems  Constitutional: Negative.   HENT:  Positive for tinnitus.   Respiratory: Negative.    Cardiovascular: Negative.   Genitourinary: Negative.   Musculoskeletal: Negative.   Neurological:  Positive for dizziness.       Episodes of vertigo when turning her head to the left  Psychiatric/Behavioral:         Patient noted to have an elevated PHQ-9 score today        Objective:    BP (!) 148/90   Pulse 67   Temp 97.8 F (36.6 C)   Ht 5' 7 (1.702 m)   Wt 135 lb (61.2 kg)   SpO2 97%   BMI 21.14 kg/m    Physical Exam Constitutional:      Appearance: Normal appearance.  HENT:     Head: Normocephalic and atraumatic.  Cardiovascular:     Rate and Rhythm: Normal rate and regular rhythm.     Heart sounds: Normal heart sounds.  Pulmonary:     Effort: Pulmonary effort is normal.     Breath sounds: Normal breath sounds. No wheezing, rhonchi or rales.  Abdominal:     General: Bowel sounds are normal. There is no distension.     Palpations: Abdomen is soft.     Tenderness: There is no abdominal tenderness. There is no guarding or rebound.  Musculoskeletal:        General:  No swelling or tenderness.     Right lower leg: No edema.     Left lower leg: No edema.  Neurological:     Mental Status: She is alert and oriented to person, place, and time.     Motor: No weakness.     Comments: Extraocular movements intact, no nystagmus noted  Psychiatric:        Mood and Affect: Mood normal.        Behavior: Behavior normal.     No results found for any visits on 08/20/24.      Assessment & Plan:   Problem List Items Addressed This Visit       Cardiovascular and Mediastinum   Bilateral carotid artery stenosis   - Patient was noted to have bilateral carotid artery stenosis on  carotid Dopplers done in the hospital -However, CTA done after that showed no evidence of carotid stenosis -No further workup at this time        Genitourinary   AKI (acute kidney injury)   - Patient had AKI on CKD in the hospital with creatinine elevation up to 1.26 from a baseline of 0.8-0.9.  Creatinine had improved to 1.19 on discharge -She saw nephrology today and had blood work done.  Will follow-up repeat BMP -No further workup at this time        Other   Dizziness - Primary   - Patient with persistent vertigo on turning her head to the left but the episodes last a few seconds and then resolved -I suspect patient likely has BPPV causing her symptoms -She does have an appointment with ENT coming up October 17 for evaluation of tinnitus.  She will discuss this with them as well -Workup for CVA including CT head, MRI brain, CTA head and neck showed no evidence of an acute CVA -Refer patient to vestibular PT for further evaluation -No further workup at this time      Relevant Orders   PT vestibular rehab   Mood disturbance   - Patient noted to have an elevated PHQ-9 and GAD-7 score today -She attributes this to not feeling better after her recent hospitalization and not being able to do the activities that she normally does -I suspect this is a transient mood disturbance secondary to her ongoing medical issues which should resolve as her medical issues improve -Patient will discuss with us  if this persists       No orders of the defined types were placed in this encounter.   No follow-ups on file.  Dontrell Stuck, MD

## 2024-08-20 NOTE — Assessment & Plan Note (Signed)
-   Patient with persistent vertigo on turning her head to the left but the episodes last a few seconds and then resolved -I suspect patient likely has BPPV causing her symptoms -She does have an appointment with ENT coming up October 17 for evaluation of tinnitus.  She will discuss this with them as well -Workup for CVA including CT head, MRI brain, CTA head and neck showed no evidence of an acute CVA -Refer patient to vestibular PT for further evaluation -No further workup at this time

## 2024-08-20 NOTE — Assessment & Plan Note (Signed)
-   Patient was noted to have bilateral carotid artery stenosis on carotid Dopplers done in the hospital -However, CTA done after that showed no evidence of carotid stenosis -No further workup at this time

## 2024-08-28 DIAGNOSIS — R42 Dizziness and giddiness: Secondary | ICD-10-CM | POA: Diagnosis not present

## 2024-08-28 DIAGNOSIS — H903 Sensorineural hearing loss, bilateral: Secondary | ICD-10-CM | POA: Diagnosis not present

## 2024-09-06 ENCOUNTER — Telehealth: Payer: Self-pay | Admitting: Cardiovascular Disease

## 2024-09-06 NOTE — Telephone Encounter (Signed)
 Dr. Milissa is requesting medical clearance for this pt to start taking hydrochlorothiazide 25mg  every day for dizziness/hearing loss. If Ok we wil check BMET in 2 weeks after starting.   I have put the form in Dr. Merlinda box

## 2024-09-07 NOTE — Telephone Encounter (Signed)
 Form completed by Dr. Gollan and faxed to [facility].

## 2024-09-09 ENCOUNTER — Other Ambulatory Visit: Payer: Self-pay | Admitting: Cardiovascular Disease

## 2024-09-09 ENCOUNTER — Other Ambulatory Visit: Payer: Self-pay | Admitting: Internal Medicine

## 2024-09-10 NOTE — Telephone Encounter (Signed)
 Form re faxed to [facility].

## 2024-09-10 NOTE — Telephone Encounter (Signed)
 Office calling to f/u on Clearance. Made office aware that per 10/17 note forms were faxed. Office would like for forms to be re faxed being that they did not receive them. Fax number in comments. Please advise

## 2024-09-13 DIAGNOSIS — Z961 Presence of intraocular lens: Secondary | ICD-10-CM | POA: Diagnosis not present

## 2024-09-13 DIAGNOSIS — H18513 Endothelial corneal dystrophy, bilateral: Secondary | ICD-10-CM | POA: Diagnosis not present

## 2024-09-27 ENCOUNTER — Encounter: Payer: Self-pay | Admitting: Internal Medicine

## 2024-09-27 ENCOUNTER — Ambulatory Visit (INDEPENDENT_AMBULATORY_CARE_PROVIDER_SITE_OTHER): Admitting: Internal Medicine

## 2024-09-27 VITALS — BP 122/65 | HR 69 | Ht 67.0 in | Wt 129.4 lb

## 2024-09-27 DIAGNOSIS — R2689 Other abnormalities of gait and mobility: Secondary | ICD-10-CM

## 2024-09-27 DIAGNOSIS — R0989 Other specified symptoms and signs involving the circulatory and respiratory systems: Secondary | ICD-10-CM | POA: Diagnosis not present

## 2024-09-27 DIAGNOSIS — N179 Acute kidney failure, unspecified: Secondary | ICD-10-CM | POA: Diagnosis not present

## 2024-09-27 DIAGNOSIS — E7849 Other hyperlipidemia: Secondary | ICD-10-CM | POA: Diagnosis not present

## 2024-09-27 DIAGNOSIS — R7303 Prediabetes: Secondary | ICD-10-CM

## 2024-09-27 DIAGNOSIS — N10 Acute tubulo-interstitial nephritis: Secondary | ICD-10-CM

## 2024-09-27 DIAGNOSIS — R269 Unspecified abnormalities of gait and mobility: Secondary | ICD-10-CM

## 2024-09-27 DIAGNOSIS — H9313 Tinnitus, bilateral: Secondary | ICD-10-CM

## 2024-09-27 DIAGNOSIS — N182 Chronic kidney disease, stage 2 (mild): Secondary | ICD-10-CM | POA: Diagnosis not present

## 2024-09-27 DIAGNOSIS — E039 Hypothyroidism, unspecified: Secondary | ICD-10-CM

## 2024-09-27 DIAGNOSIS — I6523 Occlusion and stenosis of bilateral carotid arteries: Secondary | ICD-10-CM | POA: Diagnosis not present

## 2024-09-27 DIAGNOSIS — R42 Dizziness and giddiness: Secondary | ICD-10-CM | POA: Diagnosis not present

## 2024-09-27 LAB — COMPREHENSIVE METABOLIC PANEL WITH GFR
ALT: 16 U/L (ref 0–35)
AST: 21 U/L (ref 0–37)
Albumin: 4.2 g/dL (ref 3.5–5.2)
Alkaline Phosphatase: 33 U/L — ABNORMAL LOW (ref 39–117)
BUN: 15 mg/dL (ref 6–23)
CO2: 31 meq/L (ref 19–32)
Calcium: 9.3 mg/dL (ref 8.4–10.5)
Chloride: 102 meq/L (ref 96–112)
Creatinine, Ser: 0.94 mg/dL (ref 0.40–1.20)
GFR: 56.18 mL/min — ABNORMAL LOW (ref >60.00)
Glucose, Bld: 87 mg/dL (ref 70–99)
Potassium: 4.6 meq/L (ref 3.5–5.1)
Sodium: 140 meq/L (ref 135–145)
Total Bilirubin: 0.4 mg/dL (ref 0.2–1.2)
Total Protein: 7.4 g/dL (ref 6.0–8.3)

## 2024-09-27 LAB — URINALYSIS, ROUTINE W REFLEX MICROSCOPIC
Bilirubin Urine: NEGATIVE
Hgb urine dipstick: NEGATIVE
Ketones, ur: NEGATIVE
Leukocytes,Ua: NEGATIVE
Nitrite: NEGATIVE
RBC / HPF: NONE SEEN (ref 0–?)
Specific Gravity, Urine: 1.015 (ref 1.000–1.030)
Total Protein, Urine: NEGATIVE
Urine Glucose: NEGATIVE
Urobilinogen, UA: 0.2 (ref 0.0–1.0)
pH: 8 (ref 5.0–8.0)

## 2024-09-27 LAB — LIPID PANEL
Cholesterol: 174 mg/dL (ref 0–200)
HDL: 56.3 mg/dL (ref >39.00)
LDL Cholesterol: 91 mg/dL (ref 0–99)
NonHDL: 117.41
Total CHOL/HDL Ratio: 3
Triglycerides: 130 mg/dL (ref 0.0–149.0)
VLDL: 26 mg/dL (ref 0.0–40.0)

## 2024-09-27 LAB — MICROALBUMIN / CREATININE URINE RATIO
Creatinine,U: 99.8 mg/dL
Microalb Creat Ratio: 10.4 mg/g (ref 0.0–30.0)
Microalb, Ur: 1 mg/dL (ref 0.0–1.9)

## 2024-09-27 LAB — HEMOGLOBIN A1C: Hgb A1c MFr Bld: 6.1 % (ref 4.6–6.5)

## 2024-09-27 NOTE — Patient Instructions (Addendum)
 Your referrals  to Vascular Surgery and Vestibular Rehab ([facility] P/T)  are in process as discussed.  If you are not contacted on either one after 2 weeks , PLEASE LET ME KNOW.   If your urine is Clean I will send in a medication for overactive bladder

## 2024-09-27 NOTE — Progress Notes (Unsigned)
 Subjective:  Patient ID: [name], female    DOB: Feb 17, 1941  Age: 83 y.o. MRN: [id number]  CC: There were no encounter diagnoses.   HPI [name] presents for  Chief Complaint  Patient presents with   Medical Management of Chronic Issues    3 month follow up on hypertension    Seen by Dr Onesimo for HFU on Sept 29 following and admission for delirium secondary to UTI  , AKI and rule out acute  CVA .  Positive depression score on Sept 29  precipitated by hospitalization .    Today she is jovial and upbeat,   but tired of being told I have new diseases   appetite  slowly returning to normal .  Occasionally skips  lunch because of busy schedule but always eats breakfast and will eata protein bar.  Her [place] female neighbor cooking for her  most nights  Meniere's disease diagnosed by ENT.  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 30 tablet 0   aspirin  EC 81 MG tablet Take 81 mg by mouth daily.     cholecalciferol (VITAMIN D ) 1000 UNITS tablet Take 1,000 Units by mouth daily.     Cranberry 450 MG CAPS Take 900 mg by mouth daily. Urinary health supplement     CRANBERRY PO Take 1 capsule by mouth.     diltiazem  (CARDIZEM  CD) 120 MG 24 hr capsule TAKE 1 CAPSULE BY MOUTH EVERY DAY 90 capsule 3   diphenhydramine-acetaminophen  (TYLENOL  PM) 25-500 MG TABS tablet Take 1 tablet by mouth at bedtime as needed.     glucosamine-chondroitin 500-400 MG tablet Take 1 tablet by mouth daily.      isosorbide  mononitrate (IMDUR ) 30 MG 24 hr tablet TAKE 1 TABLET BY MOUTH EVERYDAY AT BEDTIME 90 tablet 2   levothyroxine  (SYNTHROID ) 50 MCG tablet TAKE 1 TABLET BY MOUTH EVERY DAY 100 tablet 2   losartan  (COZAAR ) 25 MG tablet TAKE 1  TABLET BY MOUTH EVERYDAY AT BEDTIME 90 tablet 1   Multiple Vitamins-Minerals (MULTIVITAMIN WITH MINERALS) tablet Take 1 tablet by mouth daily.     Omega-3 Fatty Acids (FISH OIL) 1000 MG CPDR Take 1,200 mg by mouth daily.     pantoprazole  (PROTONIX ) 20 MG tablet TAKE 1 TABLET BY MOUTH EVERY DAY 90 tablet 3   rosuvastatin  (CRESTOR ) 10 MG tablet TAKE 1 TABLET BY MOUTH EVERY DAY 100 tablet 2   traZODone  (DESYREL ) 50 MG tablet Take 1 tablet (50 mg total) by mouth at bedtime as needed. for sleep 90 tablet 3   No facility-administered medications prior to visit.    Review of Systems;  Patient denies headache, fevers, malaise, unintentional weight loss, skin rash, eye pain, sinus congestion and sinus pain, sore throat, dysphagia,  hemoptysis , cough, dyspnea, wheezing, chest pain, palpitations, orthopnea, edema, abdominal pain, nausea, melena, diarrhea, constipation, flank pain, dysuria, hematuria, urinary  Frequency, nocturia, numbness, tingling, seizures,  Focal weakness, Loss of consciousness,  Tremor, insomnia, depression, anxiety, and suicidal ideation.      Objective:  BP 122/65   Pulse 69   Ht 5' 7 (1.702 m)   Wt 129 lb 6.4 oz (58.7 kg)   SpO2 99%   BMI 20.27 kg/m  BP Readings from Last 3 Encounters:  09/27/24 122/65  08/20/24 (!) 148/90  08/09/24 (!) 129/48    Wt Readings from Last 3 Encounters:  09/27/24 129 lb 6.4 oz (58.7 kg)  08/20/24 135 lb (61.2 kg)  08/07/24 135 lb 5.8 oz (61.4 kg)    Physical Exam  Lab Results  Component Value Date   HGBA1C 6.2 06/27/2024   HGBA1C 5.8 09/15/2023   HGBA1C 6.0 06/15/2023    Lab Results  Component Value Date   CREATININE 1.19 (H) 08/09/2024   CREATININE 1.23 (H) 08/08/2024   CREATININE 1.26 (H) 08/07/2024    Lab Results  Component Value Date   WBC 10.5 08/09/2024   HGB 10.1 (L) 08/09/2024   HCT 30.0 (L) 08/09/2024   PLT 135 (L) 08/09/2024   GLUCOSE 118 (H) 08/09/2024   CHOL 125 08/08/2024   TRIG 60 08/08/2024   HDL  52 08/08/2024   LDLDIRECT 99.0 06/27/2024   LDLCALC 61 08/08/2024   ALT 14 08/07/2024   AST 20 08/07/2024   NA 137 08/09/2024   K 3.9 08/09/2024   CL 105 08/09/2024   CREATININE 1.19 (H) 08/09/2024   BUN 12 08/09/2024   CO2 26 08/09/2024   TSH 1.98 06/27/2024   INR 1.3 (H) 08/07/2024   HGBA1C 6.2 06/27/2024   MICROALBUR 1.3 06/27/2024    CT ANGIO HEAD NECK W WO CM Result Date: 08/08/2024 CLINICAL DATA:  Carotid artery stenosis. EXAM: CT ANGIOGRAPHY HEAD AND NECK WITH AND WITHOUT CONTRAST TECHNIQUE: Multidetector CT imaging of the head and neck was performed using the standard protocol during bolus administration of intravenous contrast. Multiplanar CT image reconstructions and MIPs were obtained to evaluate the vascular anatomy. Carotid stenosis measurements (when applicable) are obtained utilizing NASCET criteria, using the distal internal carotid diameter as the denominator. RADIATION DOSE REDUCTION: This exam was performed according to the departmental dose-optimization program which includes automated exposure control, adjustment of the mA and/or kV according to patient size and/or use of iterative reconstruction technique. CONTRAST:  50mL OMNIPAQUE  IOHEXOL  350 MG/ML SOLN COMPARISON:  Carotid Doppler ultrasound 08/08/2024. Head CT 08/07/2024 and MRI 08/08/2024. FINDINGS: CT HEAD FINDINGS Brain: There is no evidence of an acute infarct, intracranial hemorrhage, mass, midline shift, or extra-axial fluid collection. Mild cerebral atrophy is within normal limits for age. The ventricles are normal in size. Cerebral white matter hypodensities are nonspecific but compatible with mild chronic small vessel ischemic disease. Vascular: Reported below. Skull: No fracture or suspicious lesion. Sinuses/Orbits: Paranasal sinuses and mastoid air cells are clear. Bilateral cataract extraction. Other: None. Review of the MIP images confirms the above findings CTA NECK FINDINGS Aortic arch: Normal variant aortic  arch branching pattern with common origin of the brachiocephalic and left common carotid arteries. Mild atherosclerosis. Patent brachiocephalic and subclavian arteries with less than 50% narrowing of the proximal left subclavian artery due to mixed plaque. Right carotid system: Patent with a small amount of calcified plaque at the carotid bifurcation. No evidence of a dissection or significant stenosis. Mild beading of the mid and distal cervical ICA. Left carotid system: Patent with a small amount of mixed plaque at the carotid bifurcation. No evidence of a dissection or significant stenosis. Moderate beading of the mid cervical ICA. Vertebral arteries: Patent without evidence of a dissection, stenosis, or significant atherosclerosis. Dominant left vertebral artery. Skeleton: Moderately advanced cervical disc and facet degeneration. Other neck: No evidence of cervical lymphadenopathy or mass. Upper chest: Biapical pleuroparenchymal lung scarring. Review of the MIP images confirms  the above findings CTA HEAD FINDINGS Anterior circulation: The internal carotid arteries are patent from skull base to carotid termini with mild atherosclerotic calcification bilaterally not resulting in significant stenosis. ACAs and MCAs are patent without evidence of a proximal branch occlusion or significant proximal stenosis. No aneurysm is identified. Posterior circulation: The intracranial vertebral arteries are widely patent to the basilar. Patent right PICA, left AICA, and right SCA origins are visualized. The basilar artery is widely patent. There are large left and diminutive or absent right posterior communicating arteries with absence of the left P1 segment. Both PCAs are patent without evidence of a significant proximal stenosis. No aneurysm is identified. Venous sinuses: Patent. Anatomic variants: Fetal left PCA. Review of the MIP images confirms the above findings IMPRESSION: 1. Mild atherosclerosis in the head and neck  without a large vessel occlusion or significant proximal stenosis. 2. Beading of the left greater than right cervical internal carotid arteries consistent with fibromuscular dysplasia. 3.  Aortic Atherosclerosis (ICD10-I70.0). Electronically Signed   By: Dasie Hamburg M.D.   On: 08/08/2024 12:29   ECHOCARDIOGRAM COMPLETE Result Date: 08/08/2024    ECHOCARDIOGRAM REPORT   Patient Name:   [name] Date of Exam: 08/08/2024 Medical Rec #:  [id number]         Height:       67.0 in Accession #:    [id number]        Weight:       135.4 lb Date of Birth:  06/01/1941          BSA:          1.713 m Patient Age:    83 years          BP:           134/72 mmHg Patient Gender: F                 HR:           86 bpm. Exam Location:  [facility] Procedure: 2D Echo, Cardiac Doppler, Color Doppler and Intracardiac            Opacification Agent (Both Spectral and Color Flow Doppler were            utilized during procedure). Indications:     Stroke I63.9  History:         Patient has no prior history of Echocardiogram examinations.                  Stroke.  Sonographer:     Ashley McNeely-Sloane Referring Phys:  [id number] EMERY LITTIE FUSS Diagnosing Phys: Timothy Gollan MD IMPRESSIONS  1. Left ventricular ejection fraction, by estimation, is 55 to 60%. The left ventricle has normal function. The left ventricle has no regional wall motion abnormalities. Left ventricular diastolic parameters are consistent with Grade I diastolic dysfunction (impaired relaxation).  2. Right ventricular systolic function is normal. The right ventricular size is mildly enlarged. There is normal pulmonary artery systolic pressure. The estimated right ventricular systolic pressure is 22.6 mmHg.  3. The mitral valve is normal in structure. Mild mitral valve regurgitation. No evidence of mitral stenosis.  4. Tricuspid valve regurgitation is mild to moderate.  5. The aortic valve is tricuspid. Aortic valve regurgitation is mild. Aortic valve sclerosis is present,  with no evidence of aortic valve stenosis.  6. The inferior vena cava is normal in size with greater than 50% respiratory variability, suggesting right atrial pressure of 3 mmHg. FINDINGS  Left  Ventricle: Left ventricular ejection fraction, by estimation, is 55 to 60%. The left ventricle has normal function. The left ventricle has no regional wall motion abnormalities. Definity  contrast agent was given IV to delineate the left ventricular  endocardial borders. Strain was performed and the global longitudinal strain is indeterminate. The left ventricular internal cavity size was normal in size. There is no left ventricular hypertrophy. Left ventricular diastolic parameters are consistent with Grade I diastolic dysfunction (impaired relaxation). Right Ventricle: The right ventricular size is mildly enlarged. No increase in right ventricular wall thickness. Right ventricular systolic function is normal. There is normal pulmonary artery systolic pressure. The tricuspid regurgitant velocity is 2.10  m/s, and with an assumed right atrial pressure of 5 mmHg, the estimated right ventricular systolic pressure is 22.6 mmHg. Left Atrium: Left atrial size was normal in size. Right Atrium: Right atrial size was normal in size. Pericardium: There is no evidence of pericardial effusion. Mitral Valve: The mitral valve is normal in structure. Mild mitral annular calcification. Mild mitral valve regurgitation. No evidence of mitral valve stenosis. MV peak gradient, 4.8 mmHg. The mean mitral valve gradient is 2.0 mmHg. Tricuspid Valve: The tricuspid valve is normal in structure. Tricuspid valve regurgitation is mild to moderate. No evidence of tricuspid stenosis. Aortic Valve: The aortic valve is tricuspid. Aortic valve regurgitation is mild. Aortic valve sclerosis is present, with no evidence of aortic valve stenosis. Aortic valve mean gradient measures 5.0 mmHg. Aortic valve peak gradient measures 9.9 mmHg. Aortic valve area, by VTI  measures 2.29 cm. Pulmonic Valve: The pulmonic valve was normal in structure. Pulmonic valve regurgitation is not visualized. No evidence of pulmonic stenosis. Aorta: The aortic root is normal in size and structure. Venous: The inferior vena cava is normal in size with greater than 50% respiratory variability, suggesting right atrial pressure of 3 mmHg. IAS/Shunts: No atrial level shunt detected by color flow Doppler. Additional Comments: 3D was performed not requiring image post processing on an independent workstation and was indeterminate.  LEFT VENTRICLE PLAX 2D LVIDd:         4.50 cm     Diastology LVIDs:         2.80 cm     LV e' medial:    9.03 cm/s LV PW:         1.00 cm     LV E/e' medial:  8.3 LV IVS:        1.00 cm     LV e' lateral:   10.20 cm/s LVOT diam:     2.00 cm     LV E/e' lateral: 7.3 LV SV:         68 LV SV Index:   39 LVOT Area:     3.14 cm  LV Volumes (MOD) LV vol d, MOD A2C: 48.6 ml LV vol d, MOD A4C: 92.0 ml LV vol s, MOD A2C: 25.7 ml LV vol s, MOD A4C: 44.3 ml LV SV MOD A2C:     22.9 ml LV SV MOD A4C:     92.0 ml LV SV MOD BP:      33.9 ml RIGHT VENTRICLE             IVC RV Basal diam:  2.90 cm     IVC diam: 1.20 cm RV Mid diam:    2.90 cm RV S prime:     12.10 cm/s TAPSE (M-mode): 2.2 cm LEFT ATRIUM           Index  RIGHT ATRIUM           Index LA diam:      2.70 cm 1.58 cm/m   RA Area:     15.90 cm LA Vol (A2C): 48.1 ml 28.08 ml/m  RA Volume:   42.85 ml  25.01 ml/m LA Vol (A4C): 51.9 ml 30.27 ml/m  AORTIC VALVE                     PULMONIC VALVE AV Area (Vmax):    2.08 cm      PV Vmax:        1.00 m/s AV Area (Vmean):   2.15 cm      PV Vmean:       73.100 cm/s AV Area (VTI):     2.29 cm      PV VTI:         0.181 m AV Vmax:           157.00 cm/s   PV Peak grad:   4.0 mmHg AV Vmean:          106.000 cm/s  PV Mean grad:   2.0 mmHg AV VTI:            0.295 m       RVOT Peak grad: 2 mmHg AV Peak Grad:      9.9 mmHg AV Mean Grad:      5.0 mmHg LVOT Vmax:         104.00 cm/s  LVOT Vmean:        72.700 cm/s LVOT VTI:          0.215 m LVOT/AV VTI ratio: 0.73  AORTA Ao Root diam: 2.90 cm Ao Asc diam:  2.50 cm MITRAL VALVE                TRICUSPID VALVE MV Area (PHT): 3.91 cm     TR Peak grad:   17.6 mmHg MV Area VTI:   3.07 cm     TR Vmax:        210.00 cm/s MV Peak grad:  4.8 mmHg MV Mean grad:  2.0 mmHg     SHUNTS MV Vmax:       1.09 m/s     Systemic VTI:  0.22 m MV Vmean:      66.4 cm/s    Systemic Diam: 2.00 cm MV Decel Time: 194 msec     Pulmonic VTI:  0.152 m MV E velocity: 74.50 cm/s MV A velocity: 121.00 cm/s MV E/A ratio:  0.62 Evalene Lunger MD Electronically signed by Evalene Lunger MD Signature Date/Time: 08/08/2024/11:01:34 AM    Final    US  RENAL Result Date: 08/08/2024 CLINICAL DATA:  Urinary tract infection. EXAM: RENAL / URINARY TRACT ULTRASOUND COMPLETE COMPARISON:  August 07, 2024.  August 25, 2022. FINDINGS: Right Kidney: Renal measurements: 12.3 x 4.9 x 4.8 cm = volume: 150 mL. Echogenicity within normal limits. No mass or hydronephrosis visualized. Left Kidney: Renal measurements: 10.9 x 5.8 x 4.3 cm = volume: 143 mL. Echogenicity within normal limits. No mass or hydronephrosis visualized. Bladder: Appears normal for degree of bladder distention. Calculated prevoid volume of 31 mL. Calculated postvoid volume of 18 mL. Other: Small amount of right perinephric fluid is noted. IMPRESSION: No significant renal abnormality is noted. Small amount of right perinephric fluid is noted. Electronically Signed   By: Lynwood Landy Raddle M.D.   On: 08/08/2024 09:42   US  Carotid Bilateral (at [facility] and [facility] only)  Result Date: 08/08/2024 CLINICAL DATA:  [id number].  Cerebrovascular accident. EXAM: BILATERAL CAROTID DUPLEX ULTRASOUND TECHNIQUE: Elnor scale imaging, color Doppler and duplex ultrasound were performed of bilateral carotid and vertebral arteries in the neck. COMPARISON:  CT cervical spine without contrast 11/11/2022. FINDINGS: Criteria: Quantification of carotid stenosis is  based on velocity parameters that correlate the residual internal carotid diameter with NASCET-based stenosis levels, using the diameter of the distal internal carotid lumen as the denominator for stenosis measurement. The following velocity measurements were obtained: RIGHT ICA: 80 cm/sec CCA: 78 cm/sec SYSTOLIC ICA/CCA RATIO:  1.2 ECA: 141 cm/sec LEFT ICA: 126 cm/sec CCA: 86 cm/sec SYSTOLIC ICA/CCA RATIO:  2.2 ECA: 88 cm/sec RIGHT CAROTID ARTERY: There is mild nonstenosing intimal thickening in the common carotid artery. Focal shadowing calcific plaque of the carotid bifurcation does encroach on the lumen, but less than 50% narrowing is seen on the gray scale. RIGHT VERTEBRAL ARTERY:  Flow is antegrade.  PSV is 51 cm/s. LEFT CAROTID ARTERY: There is mild nonstenosing intimal thickening in the common carotid artery. The left internal carotid artery is asymmetrically tortuous. There is heterogeneous plaque at the carotid bifurcation which does appear to narrow the lumen at least 50% with grayscale technique at the ECA origin, but there is no visible significant ICA stenosis despite the asymmetric ICA PSV and the ratio over 2. LEFT VERTEBRAL ARTERY:  Flow is antegrade.  PSV is 45 cm/s. Upper extremity blood pressures: None taken. IMPRESSION: 1. Asymmetrically tortuous left internal carotid artery. Heterogeneous plaque at the left carotid bifurcation does appear to narrow the lumen at least 50% with grayscale technique at the ECA origin, but there is no visible significant ICA stenosis despite the asymmetric ICA PSV and the ratio over 2. The numbers alone would technically be consistent with a 50-69% left ICA stenosis. Consider CTA neck. 2. Focal shadowing calcific plaque at the right carotid bifurcation does encroach on the lumen, but less than 50% narrowing is seen on the gray scale. 3. Antegrade flow in both vertebral arteries. Electronically Signed   By: Francis Quam M.D.   On: 08/08/2024 05:44   MR BRAIN WO  CONTRAST Result Date: 08/08/2024 CLINICAL DATA:  Neuro deficit, acute, stroke suspected EXAM: MRI HEAD WITHOUT CONTRAST TECHNIQUE: Multiplanar, multiecho pulse sequences of the brain and surrounding structures were obtained without intravenous contrast. COMPARISON:  CT head 08/07/2024. FINDINGS: Brain: No acute infarction, hemorrhage, hydrocephalus, extra-axial collection or mass lesion. Vascular: Normal flow voids. Skull and upper cervical spine: Normal marrow signal. Sinuses/Orbits: Negative. Other: No mastoid effusions. IMPRESSION: No evidence of acute intracranial abnormality. Electronically Signed   By: Gilmore GORMAN Molt M.D.   On: 08/08/2024 03:49    Assessment & Plan:  .There are no diagnoses linked to this encounter.   I spent 34 minutes on the day of this face to face encounter reviewing patient's  most recent visit with cardiology,  nephrology,  and neurology,  prior relevant surgical and non surgical procedures, recent  labs and imaging studies, counseling on weight management,  reviewing the assessment and plan with patient, and post visit ordering and reviewing of  diagnostics and therapeutics with patient  .   Follow-up: No follow-ups on file.   Verneita LITTIE Kettering, MD

## 2024-09-27 NOTE — Assessment & Plan Note (Signed)
 Secondary to Meniere's disease

## 2024-09-28 ENCOUNTER — Ambulatory Visit: Payer: Self-pay | Admitting: Internal Medicine

## 2024-09-28 LAB — URINE CULTURE
MICRO NUMBER:: 17200724
Result:: NO GROWTH
SPECIMEN QUALITY:: ADEQUATE

## 2024-09-28 NOTE — Assessment & Plan Note (Signed)
 Secondary to Meniere's Disease diagnosed by ENT.. .. Referring to PT for vestibular rehab

## 2024-09-28 NOTE — Assessment & Plan Note (Signed)
 Moste recent doppler prior to sept 2025 was in Feb 2023:  no significant stenosis of left carotid; < 50% stenosis of Right CCA. Referring to vascular for surveillance

## 2024-09-28 NOTE — Assessment & Plan Note (Signed)
 Her  a1c had risen slightly  due to change in diet (increased pasta ) .  Encouraged to continue healthy lifestyle .  Repeating today and then every 6 months Lab Results  Component Value Date   HGBA1C 6.1 09/27/2024

## 2024-09-28 NOTE — Assessment & Plan Note (Signed)
 Secondary to vertigo from Meniere's.  PT referral made

## 2024-09-28 NOTE — Assessment & Plan Note (Addendum)
 Secondary to pyelonephritis.  GFR has returned to baseline which remains  just under  60 ml/min. UaCr is 10   Lab Results  Component Value Date   CREATININE 0.94 09/27/2024   Lab Results  Component Value Date   MICROALBUR 1.0 09/27/2024   MICROALBUR 1.3 06/27/2024

## 2024-09-28 NOTE — Assessment & Plan Note (Signed)
 During Sept 2025 hospitalization she undrewent workup for CVA . CT head, MRI brain unremarkable for stroke. Carotid duplex Ultrasounds were completed showing 50-70% stenosis on the left and less than 50% stenosis on the right.  CTA Head and neck  was negative for any hemodynamically significant stenosis in either carotid artery  but outpatient followup with vascular surgery is needed.  Referral made, a nd patient advised to continue daily aspirin  and statin

## 2024-09-28 NOTE — Assessment & Plan Note (Signed)
 Occurred without dysuria.  Repeat UA/culture pending

## 2024-09-28 NOTE — Assessment & Plan Note (Signed)
 Secondary to hearing loss.  MRI Brain with IAC's was normal in 2024  acoustic schwannoma  . She has  hearing aides

## 2024-09-28 NOTE — Assessment & Plan Note (Signed)
 Stable GFR , 58 ml/min.  .  Continue losartan  low dose,  statin,  with goal to keep BP < 130/80  UaCR is < 30  Lab Results  Component Value Date   CREATININE 0.94 09/27/2024   Lab Results  Component Value Date   NA 140 09/27/2024   K 4.6 09/27/2024   CL 102 09/27/2024   CO2 31 09/27/2024

## 2024-10-03 ENCOUNTER — Telehealth: Payer: Self-pay | Admitting: Cardiovascular Disease

## 2024-10-03 DIAGNOSIS — H6123 Impacted cerumen, bilateral: Secondary | ICD-10-CM | POA: Diagnosis not present

## 2024-10-03 DIAGNOSIS — H6063 Unspecified chronic otitis externa, bilateral: Secondary | ICD-10-CM | POA: Diagnosis not present

## 2024-10-03 NOTE — Telephone Encounter (Signed)
   Pre-operative Risk Assessment    Patient Name: [name]  DOB: 09-Nov-1941 MRN: [id number]   Date of last office visit: 04/10/2024 Date of next office visit: n/a   Request for Surgical Clearance    Procedure:  start taking hydrochlorothiazide 25 mg QD  Date of Surgery:  Clearance TBD                                Surgeon:  Dr. Milissa Socks Group or Practice Name:  [facility] Phone number:  [phone] Fax number:  [phone]  Not Indicated Type of Clearance Requested:   - Medical    Type of Anesthesia:    Additional requests/questions:    SignedTinnie NOVAK Schools   10/03/2024, 5:10 PM

## 2024-10-04 NOTE — Telephone Encounter (Signed)
 Message sent to CMA Lauren S if she still had request, she states yes. I stated no procedure was noted in the request that she sent to preop. She said there was none of the form. I then stated she will need to call the office and get the needed information. We need the procedure before we can proceed. Lauren said she will take care of it and get the information.

## 2024-10-04 NOTE — Telephone Encounter (Signed)
 Form faxed to [place], [place] at Dr. Nanetta office.

## 2024-10-04 NOTE — Telephone Encounter (Signed)
 Preoperative team,  Please review clearance request and add procedure to formal clearance request form.  Once details are known we will be able to provide recommendations on upcoming procedure/surgery.  Thank you for your help.  Josefa HERO. Lecia Esperanza NP-C     10/04/2024, 3:09 PM [facility] [address] [place], [place] [address] Office [phone]

## 2024-10-05 ENCOUNTER — Encounter: Payer: Self-pay | Admitting: Emergency Medicine

## 2024-10-05 NOTE — Telephone Encounter (Signed)
 Called [name] and notified of the following recommendations from Dr. Gollan.  HCTZ is a diuretic that can drop her blood pressure It has the potential to decrease her volume, this might contribute to passout spells which she has had before If she does want to start HCTZ, we may need to cut back on some of her other medications We would need to keep her hydrated, monitor blood pressure closely at home Suspect we would need to cut her losartan  and isosorbide  in half Thx TGollan  Recommendations faxed to [facility] as well.

## 2024-10-05 NOTE — Telephone Encounter (Signed)
 I tried to call the surgeon's office to obtain the missing information for what the procedure to be done is.   I googled and found the ph# was entered by CMA incorrectly. I found the correct # [phone].   I left a message for Particia to call back with what the procedure is to be done. Left message to call back [phone]

## 2024-10-05 NOTE — Telephone Encounter (Signed)
 I s/w Dr. Nanetta nurse in regard to the clearance request that our office received. After speaking with her that this was not a preop clearance at all. She did tell me the form said need medical clearance to start hydrochlorothiazide. I did say that could have been confusing to me as well and I have been with our preop team for 7+ yrs. I confirmed there was no surgery/procedure to be done, she answered correct.    I told her I that I will get this to the cardiologist about Dr. Milissa wants the ok to start hydrochlorothiazide.

## 2024-10-09 ENCOUNTER — Encounter (INDEPENDENT_AMBULATORY_CARE_PROVIDER_SITE_OTHER): Admitting: Vascular Surgery

## 2024-10-16 ENCOUNTER — Ambulatory Visit (INDEPENDENT_AMBULATORY_CARE_PROVIDER_SITE_OTHER): Admitting: Vascular Surgery

## 2024-10-16 ENCOUNTER — Encounter (INDEPENDENT_AMBULATORY_CARE_PROVIDER_SITE_OTHER): Payer: Self-pay | Admitting: Vascular Surgery

## 2024-10-16 VITALS — BP 145/75 | HR 62 | Resp 18 | Ht 67.0 in | Wt 134.0 lb

## 2024-10-16 DIAGNOSIS — I6523 Occlusion and stenosis of bilateral carotid arteries: Secondary | ICD-10-CM

## 2024-10-16 DIAGNOSIS — I773 Arterial fibromuscular dysplasia: Secondary | ICD-10-CM | POA: Diagnosis not present

## 2024-10-16 DIAGNOSIS — E7849 Other hyperlipidemia: Secondary | ICD-10-CM

## 2024-10-16 DIAGNOSIS — R42 Dizziness and giddiness: Secondary | ICD-10-CM

## 2024-10-16 NOTE — Progress Notes (Signed)
 Patient ID: [name], female   DOB: 1941/02/23, 83 y.o.   MRN: [id number]  Chief Complaint  Patient presents with   New Patient (Initial Visit)    Ref Marylynn consult carotid and subclavian artery stenosis    HPI [name] is a 83 y.o. female.   Discussed the use of AI scribe software for clinical note transcription with the patient, who gave verbal consent to proceed.  History of Present Illness [name] is an 83 year old female who presents for evaluation of fibromuscular dysplasia and carotid artery plaque on referral from Dr. Marylynn.  She was recently hospitalized for two nights for concern of stroke after her daughter noticed a pulling to one side of her mouth. No cardiac source was found. She was treated for infection and dehydration and discharged.  She has persistent dizziness attributed to inner ear fluid and is followed by ENT with a visit planned next week. Her blood pressure medication was recently adjusted and she is on a two-week regimen to assess its effect on her kidneys.  She recently had a CT scan. She recalls being told she has a kink in an artery, which she understands to be related to her current carotid and fibromuscular dysplasia evaluation.    Results RADIOLOGY CT scan: Mild plaque in carotid arteries, beading of the arteries consistent with fibromuscular dysplasia, no significant narrowing or strictures.  I have independently reviewed the study.  Past Medical History:  Diagnosis Date   Abdominal aortic atherosclerosis    Arthritis of both knees    Atypical chest pain    Basal cell carcinoma 10/23/2020   right lateral ankle EDC done 12/18/20   Basal cell carcinoma 05/13/2021   right pretibia, [facility] 07/22/21   Basal cell carcinoma 12/03/2021   left chest, [facility] 01/06/2022   Basal cell carcinoma (BCC) 05/13/2021   left forehead, Moh's 07/29/2021   Candida esophagitis (HCC) 06/30/2021   Complication of anesthesia    slow to wake up    E. coli UTI 09/10/2015   Resistant to cipro  .  Keflex  sent x 5 days    Elevated blood pressure reading in office with diagnosis of hypertension    GERD (gastroesophageal reflux disease)    History of dysplastic nevus 05/13/2021   right dorsal hand, mild   Hyperlipidemia    Hypothyroidism    Insomnia    a.) uses trazodone  PRN   Laceration of head without foreign body 11/18/2022   Long term current use of aspirin     Peripheral neuropathy    Pre-diabetes    PVC's (premature ventricular contractions)    a.  48-hour Holter in 3/17 demonstrated NSR, frequent PVCs totaling 5300 beats representing 3% burden, occasional PACs, short runs of SVT   Raynaud's disease    Syncope and collapse    r/t bp meds   Vitamin D  deficiency     Past Surgical History:  Procedure Laterality Date   ABDOMINAL HYSTERECTOMY     In 30's   BROW LIFT Bilateral 07/12/2017   Procedure: BLEPHAROPLASTY upper eyelid with excess skin;  Surgeon: Ashley Greig CHRISTELLA, MD;  Location: [facility];  Service: Ophthalmology;  Laterality: Bilateral;  MAC   CATARACT EXTRACTION, BILATERAL     COLONOSCOPY WITH PROPOFOL  N/A 12/29/2015   Procedure: COLONOSCOPY WITH PROPOFOL ;  Surgeon: Deward CINDERELLA Piedmont, MD;  Location: [facility];  Service: Gastroenterology;  Laterality: N/A;   COLONOSCOPY WITH PROPOFOL  N/A 12/03/2019   Procedure: COLONOSCOPY WITH BIOPSY;  Surgeon: Jinny Carmine, MD;  Location: [facility];  Service: Endoscopy;  Laterality: N/A;   ESOPHAGOGASTRODUODENOSCOPY (EGD) WITH PROPOFOL  N/A 06/25/2021   Procedure: ESOPHAGOGASTRODUODENOSCOPY (EGD) WITH PROPOFOL ;  Surgeon: Jinny Carmine, MD;  Location: [facility];  Service: Endoscopy;  Laterality: N/A;   POLYPECTOMY N/A 12/03/2019   Procedure: POLYPECTOMY;  Surgeon: Jinny Carmine, MD;  Location: [facility];  Service: Endoscopy;  Laterality: N/A;   PTOSIS REPAIR Bilateral 07/12/2017   Procedure: ptosis repair resect ex;  Surgeon: Ashley Greig HERO, MD;  Location:  [facility];  Service: Ophthalmology;  Laterality: Bilateral;  Requests early   TOTAL KNEE ARTHROPLASTY Right 07/11/2023   Procedure: TOTAL KNEE ARTHROPLASTY;  Surgeon: Lorelle Hussar, MD;  Location: [facility] ORS;  Service: Orthopedics;  Laterality: Right;   VAGINAL PROLAPSE REPAIR  2014   Dr. Kathe     Family History  Problem Relation Age of Onset   Breast cancer Mother        35's   Heart attack Mother    Diabetes Mother    Stroke Father    Diabetes Father    Pancreatic cancer Sister    Cancer Sister        pancreatic   Cancer Sister        anal   Breast cancer Sister    Breast cancer Sister    Cancer Brother        liver   Diabetes Brother    Heart attack Brother    Diabetes Brother    Cancer Brother        stomach   Cancer Brother    Diabetes Brother    Colon cancer Neg Hx       Social History   Tobacco Use   Smoking status: Former    Types: Cigarettes    Passive exposure: Past   Smokeless tobacco: Never   Tobacco comments:    Smoked x 1 year in her 20's  Vaping Use   Vaping status: Never Used  Substance Use Topics   Alcohol use: No   Drug use: No     Allergies  Allergen Reactions   Prednisone     crazy    Current Outpatient Medications  Medication Sig Dispense Refill   acetaminophen  (TYLENOL ) 500 MG tablet Take 2 tablets (1,000 mg total) by mouth every 8 (eight) hours. 30 tablet 0   aspirin  EC 81 MG tablet Take 81 mg by mouth daily.     cholecalciferol (VITAMIN D ) 1000 UNITS tablet Take 1,000 Units by mouth daily.     Cranberry 450 MG CAPS Take 900 mg by mouth daily. Urinary health supplement     CRANBERRY PO Take 1 capsule by mouth.     diltiazem  (CARDIZEM  CD) 120 MG 24 hr capsule TAKE 1 CAPSULE BY MOUTH EVERY DAY 90 capsule 3   diphenhydramine-acetaminophen  (TYLENOL  PM) 25-500 MG TABS tablet Take 1 tablet by mouth at bedtime as needed.     glucosamine-chondroitin 500-400 MG tablet Take 1 tablet by mouth daily.      isosorbide   mononitrate (IMDUR ) 30 MG 24 hr tablet TAKE 1 TABLET BY MOUTH EVERYDAY AT BEDTIME 90 tablet 2   levothyroxine  (SYNTHROID ) 50 MCG tablet TAKE 1 TABLET BY MOUTH EVERY DAY 100 tablet 2   losartan  (COZAAR ) 25 MG tablet TAKE 1 TABLET BY MOUTH EVERYDAY AT BEDTIME 90 tablet 1   Multiple Vitamins-Minerals (MULTIVITAMIN WITH MINERALS) tablet Take 1 tablet by mouth daily.     Omega-3 Fatty Acids (FISH  OIL) 1000 MG CPDR Take 1,200 mg by mouth daily.     pantoprazole  (PROTONIX ) 20 MG tablet TAKE 1 TABLET BY MOUTH EVERY DAY 90 tablet 3   rosuvastatin  (CRESTOR ) 10 MG tablet TAKE 1 TABLET BY MOUTH EVERY DAY 100 tablet 2   traZODone  (DESYREL ) 50 MG tablet Take 1 tablet (50 mg total) by mouth at bedtime as needed. for sleep 90 tablet 3   No current facility-administered medications for this visit.      REVIEW OF SYSTEMS (Negative unless checked)  Constitutional: [] Weight loss  [] Fever  [] Chills Cardiac: [] Chest pain   [] Chest pressure   [x] Palpitations   [] Shortness of breath when laying flat   [] Shortness of breath at rest   [] Shortness of breath with exertion. Vascular:  [] Pain in legs with walking   [] Pain in legs at rest   [] Pain in legs when laying flat   [] Claudication   [] Pain in feet when walking  [] Pain in feet at rest  [] Pain in feet when laying flat   [] History of DVT   [] Phlebitis   [] Swelling in legs   [] Varicose veins   [] Non-healing ulcers Pulmonary:   [] Uses home oxygen   [] Productive cough   [] Hemoptysis   [] Wheeze  [] COPD   [] Asthma Neurologic:  [x] Dizziness  [] Blackouts   [] Seizures   [] History of stroke   [] History of TIA  [] Aphasia   [] Temporary blindness   [] Dysphagia   [] Weakness or numbness in arms   [] Weakness or numbness in legs Musculoskeletal:  [x] Arthritis   [] Joint swelling   [] Joint pain   [] Low back pain Hematologic:  [] Easy bruising  [] Easy bleeding   [] Hypercoagulable state   [] Anemic  [] Hepatitis Gastrointestinal:  [] Blood in stool   [] Vomiting blood  [x] Gastroesophageal  reflux/heartburn   [] Abdominal pain Genitourinary:  [] Chronic kidney disease   [] Difficult urination  [] Frequent urination  [] Burning with urination   [] Hematuria Skin:  [] Rashes   [] Ulcers   [] Wounds Psychological:  [] History of anxiety   []  History of major depression.    Physical Exam BP (!) 145/75 (BP Location: Left Arm)   Pulse 62   Resp 18   Ht 5' 7 (1.702 m)   Wt 134 lb (60.8 kg)   BMI 20.99 kg/m  Gen:  WD/WN, NAD.  Appears younger than stated age Head: [place]/AT, No temporalis wasting.  Ear/Nose/Throat: Hearing grossly intact, nares w/o erythema or drainage, oropharynx w/o Erythema/Exudate Eyes: Conjunctiva clear, sclera non-icteric  Neck: trachea midline.  No JVD.  Pulmonary:  Good air movement, respirations not labored, no use of accessory muscles  Cardiac: RRR, no JVD Vascular:  Vessel Right Left  Radial Palpable Palpable           Musculoskeletal: M/S 5/5 throughout.  Extremities without ischemic changes.  No deformity or atrophy.  No edema. Neurologic: Sensation grossly intact in extremities.  Symmetrical.  Speech is fluent. Motor exam as listed above. Psychiatric: Judgment intact, Mood & affect appropriate for pt's clinical situation. Dermatologic: No rashes or ulcers noted.  No cellulitis or open wounds.  Physical Exam   Radiology No results found.  Labs Recent Results (from the past 2160 hours)  Protime-INR     Status: Abnormal   Collection Time: 08/07/24  4:46 PM  Result Value Ref Range   Prothrombin Time 17.2 (H) 11.4 - 15.2 seconds   INR 1.3 (H) 0.8 - 1.2    Comment: (NOTE) INR goal varies based on device and disease states. Performed at [facility], [address], [place], [place] [address]   APTT     Status: None   Collection Time: 08/07/24  4:46 PM  Result Value Ref Range   aPTT 35 24 - 36 seconds    Comment: Performed at [facility], [address] [place], [place] [address]  CBC     Status: Abnormal   Collection  Time: 08/07/24  4:46 PM  Result Value Ref Range   WBC 13.5 (H) 4.0 - 10.5 K/uL   RBC 3.70 (L) 3.87 - 5.11 MIL/uL   Hemoglobin 11.9 (L) 12.0 - 15.0 g/dL   HCT 64.9 (L) 63.9 - 53.9 %   MCV 94.6 80.0 - 100.0 fL   MCH 32.2 26.0 - 34.0 pg   MCHC 34.0 30.0 - 36.0 g/dL   RDW 87.6 88.4 - 84.4 %   Platelets 203 150 - 400 K/uL   nRBC 0.0 0.0 - 0.2 %    Comment: Performed at [facility], [address] [place], [place] [address]  Differential     Status: Abnormal   Collection Time: 08/07/24  4:46 PM  Result Value Ref Range   Neutrophils Relative % 84 %   Neutro Abs 11.5 (H) 1.7 - 7.7 K/uL   Lymphocytes Relative 6 %   Lymphs Abs 0.8 0.7 - 4.0 K/uL   Monocytes Relative 9 %   Monocytes Absolute 1.2 (H) 0.1 - 1.0 K/uL   Eosinophils Relative 0 %   Eosinophils Absolute 0.0 0.0 - 0.5 K/uL   Basophils Relative 0 %   Basophils Absolute 0.1 0.0 - 0.1 K/uL   Immature Granulocytes 1 %   Abs Immature Granulocytes 0.08 (H) 0.00 - 0.07 K/uL    Comment: Performed at [facility], [address] [place], [place] [address]  Comprehensive metabolic panel     Status: Abnormal   Collection Time: 08/07/24  4:46 PM  Result Value Ref Range   Sodium 133 (L) 135 - 145 mmol/L   Potassium 4.3 3.5 - 5.1 mmol/L   Chloride 97 (L) 98 - 111 mmol/L   CO2 26 22 - 32 mmol/L   Glucose, Bld 116 (H) 70 - 99 mg/dL    Comment: Glucose reference range applies only to samples taken after fasting for at least 8 hours.   BUN 18 8 - 23 mg/dL   Creatinine, Ser 8.73 (H) 0.44 - 1.00 mg/dL   Calcium  9.1 8.9 - 10.3 mg/dL   Total Protein 7.2 6.5 - 8.1 g/dL   Albumin 3.6 3.5 - 5.0 g/dL   AST 20 15 - 41 U/L   ALT 14 0 - 44 U/L   Alkaline Phosphatase 41 38 - 126 U/L   Total Bilirubin 0.8 0.0 - 1.2 mg/dL   GFR, Estimated 42 (L) >60 mL/min    Comment: (NOTE) Calculated using the CKD-EPI Creatinine Equation (2021)    Anion gap 10 5 - 15    Comment: Performed at [facility], [address]  [place], [place] [address]  Ethanol     Status: None   Collection Time: 08/07/24  4:46 PM  Result Value Ref Range   Alcohol, Ethyl (B) <15 <15 mg/dL    Comment: (NOTE) For medical purposes only. Performed at [facility], [address] [place], [place] [address]   Urinalysis, Routine w reflex microscopic -Urine, Clean Catch     Status: Abnormal   Collection Time: 08/07/24 11:11 PM  Result Value Ref Range   Color, Urine STRAW (A) YELLOW  APPearance CLEAR (A) CLEAR   Specific Gravity, Urine 1.003 (L) 1.005 - 1.030   pH 7.0 5.0 - 8.0   Glucose, UA NEGATIVE NEGATIVE mg/dL   Hgb urine dipstick SMALL (A) NEGATIVE   Bilirubin Urine NEGATIVE NEGATIVE   Ketones, ur NEGATIVE NEGATIVE mg/dL   Protein, ur NEGATIVE NEGATIVE mg/dL   Nitrite NEGATIVE NEGATIVE   Leukocytes,Ua MODERATE (A) NEGATIVE   RBC / HPF 0-5 0 - 5 RBC/hpf   WBC, UA 11-20 0 - 5 WBC/hpf   Bacteria, UA RARE (A) NONE SEEN   Squamous Epithelial / HPF 0-5 0 - 5 /HPF   Mucus PRESENT     Comment: Performed at [facility], [address] [place], [place] [address]  Urine Culture     Status: Abnormal   Collection Time: 08/07/24 11:11 PM   Specimen: Urine, Clean Catch  Result Value Ref Range   Specimen Description      URINE, CLEAN CATCH Performed at [facility], [address], [place], [place] [address]    Special Requests      NONE Performed at [facility], [address] [place], [place] [address]    Culture MULTIPLE SPECIES PRESENT, SUGGEST RECOLLECTION (A)    Report Status 08/09/2024 FINAL   Lipid panel     Status: None   Collection Time: 08/08/24 12:35 AM  Result Value Ref Range   Cholesterol 125 0 - 200 mg/dL   Triglycerides 60 <849 mg/dL   HDL 52 >59 mg/dL   Total CHOL/HDL Ratio 2.4 RATIO   VLDL 12 0 - 40 mg/dL   LDL Cholesterol 61 0 - 99 mg/dL    Comment:        Total Cholesterol/HDL:CHD Risk Coronary Heart Disease Risk Table                     Men   Women  1/2  Average Risk   3.4   3.3  Average Risk       5.0   4.4  2 X Average Risk   9.6   7.1  3 X Average Risk  23.4   11.0        Use the calculated Patient Ratio above and the CHD Risk Table to determine the patient's CHD Risk.        ATP III CLASSIFICATION (LDL):  <100     mg/dL   Optimal  899-870  mg/dL   Near or Above                    Optimal  130-159  mg/dL   Borderline  839-810  mg/dL   High  >809     mg/dL   Very High Performed at [facility], [address] [place], [place] [address]   CBC     Status: Abnormal   Collection Time: 08/08/24 12:35 AM  Result Value Ref Range   WBC 12.7 (H) 4.0 - 10.5 K/uL   RBC 3.43 (L) 3.87 - 5.11 MIL/uL   Hemoglobin 11.1 (L) 12.0 - 15.0 g/dL   HCT 67.1 (L) 63.9 - 53.9 %   MCV 95.6 80.0 - 100.0 fL   MCH 32.4 26.0 - 34.0 pg   MCHC 33.8 30.0 - 36.0 g/dL   RDW 87.3 88.4 - 84.4 %   Platelets 188 150 - 400 K/uL   nRBC 0.0 0.0 - 0.2 %    Comment: Performed at [facility], [address]  [place], [place] [address]  Creatinine, serum     Status: Abnormal   Collection Time: 08/08/24 12:35 AM  Result Value Ref Range   Creatinine, Ser 1.23 (H) 0.44 - 1.00 mg/dL   GFR, Estimated 44 (L) >60 mL/min    Comment: (NOTE) Calculated using the CKD-EPI Creatinine Equation (2021) Performed at [facility], [address] [place], [place] [address]   Magnesium     Status: None   Collection Time: 08/08/24 12:35 AM  Result Value Ref Range   Magnesium 2.4 1.7 - 2.4 mg/dL    Comment: Performed at [facility], [address] [place], [place] [address]  Phosphorus     Status: None   Collection Time: 08/08/24 12:35 AM  Result Value Ref Range   Phosphorus 2.9 2.5 - 4.6 mg/dL    Comment: Performed at [facility], [address] [place], [place] [address]  Iron and TIBC     Status: Abnormal   Collection Time: 08/08/24 12:35 AM  Result Value Ref Range   Iron 10 (L) 28 - 170 ug/dL   TIBC 782 (L) 749 - 549  ug/dL   Saturation Ratios 5 (L) 10.4 - 31.8 %   UIBC 207 ug/dL    Comment: Performed at [facility], [address], [place], [place] [address]  Resp panel by RT-PCR (RSV, Flu A&B, Covid) Anterior Nasal Swab     Status: None   Collection Time: 08/08/24  8:01 AM   Specimen: Anterior Nasal Swab  Result Value Ref Range   SARS Coronavirus 2 by RT PCR NEGATIVE NEGATIVE    Comment: (NOTE) SARS-CoV-2 target nucleic acids are NOT DETECTED.  The SARS-CoV-2 RNA is generally detectable in upper respiratory specimens during the acute phase of infection. The lowest concentration of SARS-CoV-2 viral copies this assay can detect is 138 copies/mL. A negative result does not preclude SARS-Cov-2 infection and should not be used as the sole basis for treatment or other patient management decisions. A negative result may occur with  improper specimen collection/handling, submission of specimen other than nasopharyngeal swab, presence of viral mutation(s) within the areas targeted by this assay, and inadequate number of viral copies(<138 copies/mL). A negative result must be combined with clinical observations, patient history, and epidemiological information. The expected result is Negative.  Fact Sheet for Patients:  bloggercourse.com  Fact Sheet for Healthcare Providers:  seriousbroker.it  This test is no t yet approved or cleared by the [place]  FDA and  has been authorized for detection and/or diagnosis of SARS-CoV-2 by FDA under an Emergency Use Authorization (EUA). This EUA will remain  in effect (meaning this test can be used) for the duration of the COVID-19 declaration under Section 564(b)(1) of the Act, 21 U.S.C.section 360bbb-3(b)(1), unless the authorization is terminated  or revoked sooner.       Influenza A by PCR NEGATIVE NEGATIVE   Influenza B by PCR NEGATIVE NEGATIVE    Comment: (NOTE) The Xpert Xpress  SARS-CoV-2/FLU/RSV plus assay is intended as an aid in the diagnosis of influenza from Nasopharyngeal swab specimens and should not be used as a sole basis for treatment. Nasal washings and aspirates are unacceptable for Xpert Xpress SARS-CoV-2/FLU/RSV testing.  Fact Sheet for Patients: bloggercourse.com  Fact Sheet for Healthcare Providers: seriousbroker.it  This test is not yet approved or cleared by the [place]  FDA and has been authorized for detection and/or diagnosis of SARS-CoV-2 by FDA under an Emergency Use Authorization (EUA). This EUA will remain in  effect (meaning this test can be used) for the duration of the COVID-19 declaration under Section 564(b)(1) of the Act, 21 U.S.C. section 360bbb-3(b)(1), unless the authorization is terminated or revoked.     Resp Syncytial Virus by PCR NEGATIVE NEGATIVE    Comment: (NOTE) Fact Sheet for Patients: bloggercourse.com  Fact Sheet for Healthcare Providers: seriousbroker.it  This test is not yet approved or cleared by the [place]  FDA and has been authorized for detection and/or diagnosis of SARS-CoV-2 by FDA under an Emergency Use Authorization (EUA). This EUA will remain in effect (meaning this test can be used) for the duration of the COVID-19 declaration under Section 564(b)(1) of the Act, 21 U.S.C. section 360bbb-3(b)(1), unless the authorization is terminated or revoked.  Performed at [facility], [address] [place], [place] [address]   ECHOCARDIOGRAM COMPLETE     Status: None   Collection Time: 08/08/24  8:35 AM  Result Value Ref Range   Weight 2,165.8 oz   Height 67 in   BP 122/59 mmHg   Ao pk vel 1.57 m/s   AV Area VTI 2.29 cm2   AR max vel 2.08 cm2   AV Mean grad 5.0 mmHg   AV Peak grad 9.9 mmHg   Single Plane A2C EF 47.1 %   Single Plane A4C EF 51.8 %   Calc EF 48.3 %   S'  Lateral 2.80 cm   AV Area mean vel 2.15 cm2   Area-P 1/2 3.91 cm2   MV VTI 3.07 cm2   Est EF 55 - 60%   Basic metabolic panel with GFR     Status: Abnormal   Collection Time: 08/09/24  5:17 AM  Result Value Ref Range   Sodium 137 135 - 145 mmol/L   Potassium 3.9 3.5 - 5.1 mmol/L   Chloride 105 98 - 111 mmol/L   CO2 26 22 - 32 mmol/L   Glucose, Bld 118 (H) 70 - 99 mg/dL    Comment: Glucose reference range applies only to samples taken after fasting for at least 8 hours.   BUN 12 8 - 23 mg/dL   Creatinine, Ser 8.80 (H) 0.44 - 1.00 mg/dL   Calcium  8.0 (L) 8.9 - 10.3 mg/dL   GFR, Estimated 45 (L) >60 mL/min    Comment: (NOTE) Calculated using the CKD-EPI Creatinine Equation (2021)    Anion gap 6 5 - 15    Comment: Performed at [facility], [address] [place], [place] [address]  CBC     Status: Abnormal   Collection Time: 08/09/24  5:17 AM  Result Value Ref Range   WBC 10.5 4.0 - 10.5 K/uL   RBC 3.21 (L) 3.87 - 5.11 MIL/uL   Hemoglobin 10.1 (L) 12.0 - 15.0 g/dL   HCT 69.9 (L) 63.9 - 53.9 %   MCV 93.5 80.0 - 100.0 fL   MCH 31.5 26.0 - 34.0 pg   MCHC 33.7 30.0 - 36.0 g/dL   RDW 87.3 88.4 - 84.4 %   Platelets 135 (L) 150 - 400 K/uL   nRBC 0.0 0.0 - 0.2 %    Comment: Performed at [facility], [address] [place], [place] [address]  Magnesium     Status: Abnormal   Collection Time: 08/09/24  5:17 AM  Result Value Ref Range   Magnesium 2.5 (H) 1.7 - 2.4 mg/dL    Comment: Performed at [facility], [address] [place], [place] [address]  Phosphorus  Status: None   Collection Time: 08/09/24  5:17 AM  Result Value Ref Range   Phosphorus 2.9 2.5 - 4.6 mg/dL    Comment: Performed at [facility], [address] [place], [place] [address]  Comprehensive metabolic panel with GFR     Status: Abnormal   Collection Time: 09/27/24  9:53 AM  Result Value Ref Range   Sodium 140 135 - 145 mEq/L   Potassium 4.6 3.5 - 5.1 mEq/L    Chloride 102 96 - 112 mEq/L   CO2 31 19 - 32 mEq/L    Comment: Elevated LDH levels may cause falsely increased CO2 results. If LDH is >2000 U/L, a positive bias of 12% is possible.   Glucose, Bld 87 70 - 99 mg/dL   BUN 15 6 - 23 mg/dL   Creatinine, Ser 9.05 0.40 - 1.20 mg/dL   Total Bilirubin 0.4 0.2 - 1.2 mg/dL   Alkaline Phosphatase 33 (L) 39 - 117 U/L   AST 21 0 - 37 U/L   ALT 16 0 - 35 U/L   Total Protein 7.4 6.0 - 8.3 g/dL   Albumin 4.2 3.5 - 5.2 g/dL   GFR 43.81 (L) >39.99 mL/min    Comment: Calculated using the CKD-EPI Creatinine Equation (2021)   Calcium  9.3 8.4 - 10.5 mg/dL  Microalbumin / creatinine urine ratio     Status: None   Collection Time: 09/27/24  9:53 AM  Result Value Ref Range   Microalb, Ur 1.0 0.0 - 1.9 mg/dL   Creatinine,U 00.1 mg/dL   Microalb Creat Ratio 10.4 0.0 - 30.0 mg/g  Urinalysis, Routine w reflex microscopic     Status: Abnormal   Collection Time: 09/27/24  9:53 AM  Result Value Ref Range   Color, Urine YELLOW Yellow;Lt. Yellow;Straw;Dark Yellow;Amber;Green;Red;Brown   APPearance CLEAR Clear;Turbid;Slightly Cloudy;Cloudy   Specific Gravity, Urine 1.015 1.000 - 1.030   pH 8.0 5.0 - 8.0   Total Protein, Urine NEGATIVE Negative   Urine Glucose NEGATIVE Negative   Ketones, ur NEGATIVE Negative   Bilirubin Urine NEGATIVE Negative   Hgb urine dipstick NEGATIVE Negative   Urobilinogen, UA 0.2 0.0 - 1.0   Leukocytes,Ua NEGATIVE Negative   Nitrite NEGATIVE Negative   WBC, UA 0-2/hpf 0-2/hpf   RBC / HPF none seen 0-2/hpf   Granular Casts, UA Presence of (A) None   Hyaline Casts, UA Presence of (A) None  Urine Culture     Status: None   Collection Time: 09/27/24  9:53 AM   Specimen: Blood  Result Value Ref Range   MICRO NUMBER: [id number]    SPECIMEN QUALITY: Adequate    Sample Source URINE    STATUS: FINAL    Result: No Growth   Hemoglobin A1c     Status: None   Collection Time: 09/27/24  9:53 AM  Result Value Ref Range   Hgb A1c MFr Bld 6.1  4.6 - 6.5 %    Comment: Glycemic Control Guidelines for People with Diabetes:Non Diabetic:  <6%Goal of Therapy: <7%Additional Action Suggested:  >8%   Lipid panel     Status: None   Collection Time: 09/27/24  9:53 AM  Result Value Ref Range   Cholesterol 174 0 - 200 mg/dL    Comment: ATP III Classification       Desirable:  < 200 mg/dL               Borderline High:  200 - 239 mg/dL  High:  > = 240 mg/dL   Triglycerides 869.9 0.0 - 149.0 mg/dL    Comment: Normal:  <849 mg/dLBorderline High:  150 - 199 mg/dL   HDL 43.69 >60.99 mg/dL   VLDL 73.9 0.0 - 59.9 mg/dL   LDL Cholesterol 91 0 - 99 mg/dL   Total CHOL/HDL Ratio 3     Comment:                Men          Women1/2 Average Risk     3.4          3.3Average Risk          5.0          4.42X Average Risk          9.6          7.13X Average Risk          15.0          11.0                       NonHDL 117.41     Comment: NOTE:  Non-HDL goal should be 30 mg/dL higher than patient's LDL goal (i.e. LDL goal of < 70 mg/dL, would have non-HDL goal of < 100 mg/dL)    Assessment/Plan:      Assessment & Plan Fibromuscular dysplasia of carotid arteries Fibromuscular dysplasia identified on CT scan with arterial tortuosity and kinking. Low risk of complications, generally asymptomatic. - Perform annual ultrasound to monitor.  Continue aspirin  and Crestor .  Mild carotid artery atherosclerosis Noted on CT scan without significant stenosis or immediate cerebrovascular risk. - Continue routine monitoring.  We will plan annual duplex follow-up.  Continue aspirin  and Crestor .  Familial hyperlipidemia lipid control important in reducing the progression of atherosclerotic disease. Continue statin therapy   Dizziness Seeing ENT.  Felt to be due to inner ear issues.     Selinda Gu 10/16/2024, 4:02 PM   This note was created with Dragon medical transcription system.  Any errors from dictation are unintentional.

## 2024-10-16 NOTE — Assessment & Plan Note (Signed)
 lipid control important in reducing the progression of atherosclerotic disease. Continue statin therapy

## 2024-10-16 NOTE — Assessment & Plan Note (Signed)
 Seeing ENT.  Felt to be due to inner ear issues.

## 2024-10-23 DIAGNOSIS — R42 Dizziness and giddiness: Secondary | ICD-10-CM | POA: Diagnosis not present

## 2024-11-26 ENCOUNTER — Ambulatory Visit: Attending: Internal Medicine

## 2024-11-26 DIAGNOSIS — R42 Dizziness and giddiness: Secondary | ICD-10-CM | POA: Insufficient documentation

## 2024-11-26 DIAGNOSIS — R2689 Other abnormalities of gait and mobility: Secondary | ICD-10-CM | POA: Diagnosis not present

## 2024-11-26 NOTE — Therapy (Signed)
 " OUTPATIENT PHYSICAL THERAPY VESTIBULAR EVALUATION  Patient Name: [name] MRN: [id number] DOB:13-Jul-1941, 84 y.o., female Today's Date: 11/27/2024  END OF SESSION:  PT End of Session - 11/26/24 1309     Visit Number 1    Number of Visits 17    Date for Recertification  01/21/25    Authorization Type eval: 11/26/24    PT Start Time 1325    PT Stop Time 1400    PT Time Calculation (min) 35 min    Equipment Utilized During Treatment Gait belt    Activity Tolerance Patient tolerated treatment well    Behavior During Therapy [facility] for tasks assessed/performed         Past Medical History:  Diagnosis Date   Abdominal aortic atherosclerosis    Arthritis of both knees    Atypical chest pain    Basal cell carcinoma 10/23/2020   right lateral ankle EDC done 12/18/20   Basal cell carcinoma 05/13/2021   right pretibia, [facility] 07/22/21   Basal cell carcinoma 12/03/2021   left chest, [facility] 01/06/2022   Basal cell carcinoma (BCC) 05/13/2021   left forehead, Moh's 07/29/2021   Candida esophagitis (HCC) 06/30/2021   Complication of anesthesia    slow to wake up   E. coli UTI 09/10/2015   Resistant to cipro  .  Keflex  sent x 5 days    Elevated blood pressure reading in office with diagnosis of hypertension    GERD (gastroesophageal reflux disease)    History of dysplastic nevus 05/13/2021   right dorsal hand, mild   Hyperlipidemia    Hypothyroidism    Insomnia    a.) uses trazodone  PRN   Laceration of head without foreign body 11/18/2022   Long term current use of aspirin     Peripheral neuropathy    Pre-diabetes    PVC's (premature ventricular contractions)    a.  48-hour Holter in 3/17 demonstrated NSR, frequent PVCs totaling 5300 beats representing 3% burden, occasional PACs, short runs of SVT   Raynaud's disease    Syncope and collapse    r/t bp meds   Vitamin D  deficiency    Past Surgical History:  Procedure Laterality Date   ABDOMINAL HYSTERECTOMY     In 30's   BROW LIFT  Bilateral 07/12/2017   Procedure: BLEPHAROPLASTY upper eyelid with excess skin;  Surgeon: Ashley Greig CHRISTELLA, MD;  Location: [facility];  Service: Ophthalmology;  Laterality: Bilateral;  MAC   CATARACT EXTRACTION, BILATERAL     COLONOSCOPY WITH PROPOFOL  N/A 12/29/2015   Procedure: COLONOSCOPY WITH PROPOFOL ;  Surgeon: Deward CINDERELLA Piedmont, MD;  Location: [facility] END[facility];  Service: Gastroenterology;  Laterality: N/A;   COLONOSCOPY WITH PROPOFOL  N/A 12/03/2019   Procedure: COLONOSCOPY WITH BIOPSY;  Surgeon: Jinny Carmine, MD;  Location: [facility];  Service: Endoscopy;  Laterality: N/A;   ESOPHAGOGASTRODUODENOSCOPY (EGD) WITH PROPOFOL  N/A 06/25/2021   Procedure: ESOPHAGOGASTRODUODENOSCOPY (EGD) WITH PROPOFOL ;  Surgeon: Jinny Carmine, MD;  Location: [facility];  Service: Endoscopy;  Laterality: N/A;   POLYPECTOMY N/A 12/03/2019   Procedure: POLYPECTOMY;  Surgeon: Jinny Carmine, MD;  Location: [facility];  Service: Endoscopy;  Laterality: N/A;   PTOSIS REPAIR Bilateral 07/12/2017   Procedure: ptosis repair resect ex;  Surgeon: Ashley Greig CHRISTELLA, MD;  Location: [facility];  Service: Ophthalmology;  Laterality: Bilateral;  Requests early   TOTAL KNEE ARTHROPLASTY Right 07/11/2023   Procedure: TOTAL KNEE ARTHROPLASTY;  Surgeon: Lorelle Hussar, MD;  Location: [facility] ORS;  Service: Orthopedics;  Laterality:  Right;   VAGINAL PROLAPSE REPAIR  2014   Dr. Kathe   Patient Active Problem List   Diagnosis Date Noted   Fibromuscular dysplasia of both carotid arteries 10/16/2024   AKI (acute kidney injury) 08/20/2024   Mood disturbance 08/20/2024   Acute pyelonephritis 08/08/2024   Bilateral carotid artery stenosis 08/08/2024   Dizziness 08/07/2024   Balance problem 08/07/2024   Gait disturbance 08/07/2024   CKD (chronic kidney disease), stage II 09/17/2023   S/P TKR (total knee replacement) using cement, right 07/11/2023   Syncope and collapse 12/04/2022   Peripheral  neuropathy 12/30/2021   Insomnia disorder 09/25/2020   Polyp of ascending colon    Constipation 09/15/2019   Prediabetes 09/05/2017   Raynaud disease 09/05/2017   Abdominal aortic atherosclerosis 03/06/2017   Preoperative evaluation to rule out surgical contraindication 09/05/2016   PVC's (premature ventricular contractions) 11/04/2015   Atypical chest pain 11/03/2015   Tinnitus 09/17/2015   Family history of colon cancer 09/17/2015   Low back pain 06/24/2015   Medicare annual wellness visit, subsequent 12/07/2013   Left carotid bruit 12/07/2013   Varicose veins of bilateral lower extremities with other complications 04/12/2013   Vitamin D  deficiency 08/04/2012   Familial hyperlipidemia 11/17/2011   Acquired hypothyroidism 11/17/2011   PCP: Marylynn Verneita CROME, MD  REFERRING PROVIDER: Marylynn Verneita CROME, MD   REFERRING DIAG: Dizziness (R42)  RATIONALE FOR EVALUATION AND TREATMENT: Rehabilitation  THERAPY DIAG: Dizziness and giddiness  ONSET DATE: 1.5 years ago  FOLLOW-UP APPT SCHEDULED WITH REFERRING PROVIDER: No    SUBJECTIVE:   Chief Complaint:  Dizziness  Pertinent History Pt states that she fell at the [facility] 1.5 years ago. She was feeling dizzy and as she was walking to the door she fell. She has been experiencing mild intermittent dizziness since that time but had another severe acute exacerbation of symptoms this year in September resulting in a hospital admission at [facility]. She was admitted for delirium secondary to pyelonephritis complicated by AKI and dizziness concerning for acute CVA. She underwent workup with CT head, MRI brain unremarkable for stroke. Carotid duplex ultrasound was completed showing 50-70% stenosis on the left and less than 50% stenosis on the right. CTA Head and neck was negative for any hemodynamically significant stenosis in either carotid artery. She has been seen ENT and the note states they are considering possible bilateral Meniere's disease. BPPV  positional testing at that time was negative. No record of additional ENG subtests performed. SNHL was grossly symmetrical. 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 Per PCP note, outpatient referral for followup with vascular surgery made after hospital admission and patient advised to continue daily aspirin  and statin. She was on hydrochlorothiazide for 2 weeks as prescribed by ENT and then discontinued. Pt feels like HCTZ helped her symptoms. She has a new eyeglass prescriptions in the last 2 weeks (uses glasses prn);  US  Carotid Bilateral 08/08/24: IMPRESSION: 1. Asymmetrically tortuous left internal carotid artery. Heterogeneous plaque at the left carotid bifurcation does appear to narrow the lumen at least 50% with grayscale technique at the ECA origin, but there is no visible significant ICA stenosis despite the asymmetric ICA PSV and the ratio over 2. The numbers alone would technically be consistent with a 50-69% left ICA stenosis. Consider CTA neck. 2. Focal shadowing calcific plaque at the right carotid bifurcation does encroach on the lumen, but less than 50% narrowing is seen on the gray scale. 3. Antegrade flow in both vertebral arteries  CT Angio Head/Neck 08/08/2024:  IMPRESSION: 1. Mild atherosclerosis in the head and neck without a large vessel occlusion or significant proximal stenosis. 2. Beading of the left greater than right cervical internal carotid arteries consistent with fibromuscular dysplasia. 3. Aortic Atherosclerosis  MR BRAIN WO CONTRAST 08/08/2024 FINDINGS: Brain: No acute infarction, hemorrhage, hydrocephalus, extra-axial collection or mass lesion. Vascular: Normal flow voids. Skull and upper cervical spine: Normal marrow signal. Sinuses/Orbits: Negative. Other: No mastoid effusions. IMPRESSION: No evidence of acute intracranial abnormality.  Brain MRI with IAC 01/25/2023 IMPRESSION: 1. Negative internal auditory canal imaging. No retrocochlear lesion. 2. Mild chronic small  vessel ischemic disease.  Description of dizziness: vertigo, lightheadedness, and presyncopal symptoms; Frequency: once a month Duration: seconds Symptom nature: motion provoked, quick rotation Progression of symptoms since onset: better History of similar episodes: Yes, pt reports in her 20's and 30's she would have dizzy/lightheaded spell (she was told at that time it was related to a tortuous carotid artery);  Provocative Factors: quick head turns,  Easing Factors: sit down and wait for symptoms to pass;  Auditory complaints (tinnitus, pain, drainage, hearing loss, aural fullness): Yes, tinnitus previously for the last month but resolved since seeing ENT and starting hydrochlorothiazide. Bilateral hearing loss (wears bilateral aids), no aural fullness, denies drainage or ear pain; Vision changes (diplopia, visual field loss, recent changes, recent eye exam): No, changes. Hx of Fuch's Dystrophy, updated eyeglass prescription in the last 2 weeks; Chest pain/palpitations: No History of head injury/concussion: Yes, pt struck her head when she fell 1.5 years ago. No additional head trauma Stress/anxiety: No Migraines/headaches: No Nausea/vomiting: No Numbness/tingling: Yes, neuropathy in bilateral hands and feet; Focal weakness: No Dysarthria/dysphagia/drop attacks: Yes, dtr reported LUE weakness and L facial drooping prior to hospital visit 07/2024.   Has patient fallen in last 6 months? No Pertinent pain: Yes, pt reports chronic neck pain with cracking and popping Dominant hand: left Imaging: Yes, see history Prior level of function: Independent Occupational demands: Retired Presenter, Broadcasting: exercise classes Red Flags: Negative for chills/fever, night sweats, unexplained weight loss/gain  PRECAUTIONS: Fall  WEIGHT BEARING RESTRICTIONS No  LIVING ENVIRONMENT: Lives with: lives with their son Lives in: [place], townhouse with bedroom/bath on main floor, handicap accessible.   Stairs: Yes; 5 external steps with handrails as well as mechanical lift; Has following equipment at home: Vannie - 2 wheeled, Wheelchair (manual), Graybar electric, and Grab bars  PATIENT GOALS: I do basically everything I need to do but I'm paranoid that I'll fall.   OBJECTIVE EXAMINATION  POSTURE: No gross deficits contributing to symptoms  NEUROLOGICAL SCREEN: (2+ unless otherwise noted.) N=normal  Ab=abnormal  Level Dermatome R L Myotome R L Reflex R L  C3 Anterior Neck N N Sidebend C2-3 N N Jaw CN V    C4 Top of Shoulder N N Shoulder Shrug C4 N N Hoffmans UMN    C5 Lateral Upper Arm N N Shoulder ABD C4-5 N N Biceps C5-6    C6 Lateral Arm/ Thumb N N Arm Flex/ Wrist Ext C5-6 N N Brachiorad. C5-6    C7 Middle Finger N N Arm Ext//Wrist Flex C6-7 N N Triceps C7    C8 4th & 5th Finger N N Flex/ Ext Carpi Ulnaris C8 N N Patellar (L3-4)    T1 Medial Arm N N Interossei T1 N N Gastrocnemius    L2 Medial thigh/groin N N Illiopsoas (L2-3) N N     L3 Lower thigh/med.knee N N Quadriceps (L3-4) N N     L4 Medial leg/lat thigh  N N Tibialis Ant (L4-5) N N     L5 Lat. leg & dorsal foot N N EHL (L5) N N     S1 post/lat foot/thigh/leg N N Gastrocnemius (S1-2) N N     S2 Post./med. thigh & leg N N Hamstrings (L4-S3) N N      CRANIAL NERVES II, III, IV, VI: Pupils equal and reactive to light, visual acuity and visual fields are intact, extraocular muscles are intact  V: Facial sensation is intact and symmetric bilaterally  VII: Facial strength is intact and symmetric bilaterally  VIII: Hearing is normal as tested by gross conversation IX, X: Palate elevates midline, normal phonation, uvula midline XI: Shoulder shrug strength is intact  XII: Tongue protrudes midline   COORDINATION Finger to Nose: RUE mild dysmetria but otherwise WNL Heel to Shin: Normal Pronator Drift: Negative Rapid Alternating Movements: Normal Finger to Thumb Opposition: Normal   RANGE OF MOTION Cervical Spine AROM WFL  and painless in all planes. No functional focal deficits in AROM noted in BUE/BLE  MANUAL MUSCLE TESTING BUE/BLE strength WNL without focal deficits with the exception of 4/5 bilateral ankle DF.  TRANSFERS/GAIT Independent for transfers and ambulation without assistive device   PATIENT SURVEYS ABC: 51.3% DHI: 50/100  OCULOMOTOR / VESTIBULAR TESTING  Oculomotor Exam- Room Light  Findings Comments  Ocular Alignment normal   Ocular ROM normal   Spontaneous Nystagmus normal   Gaze-Holding Nystagmus normal   End-Gaze Nystagmus normal   Vergence (normal 2-3) not examined   Smooth Pursuit normal   Cross-Cover Test not examined   Saccades normal   VOR Cancellation normal   Left Head Impulse normal   Right Head Impulse normal   Static Acuity not examined   Dynamic Acuity not examined    Oculomotor Exam- Fixation Suppressed: Deferred  BPPV TESTS:  Symptoms Duration Intensity Nystagmus  Loaded L Dix-Hallpike None   None  Loaded R Dix-Hallpike None   None  L Head Roll None   None  R Head Roll None   None  L Sidelying Test      R Sidelying Test      (blank = not tested)  Clinical Test of Sensory Interaction for Balance (CTSIB): Deferred  FUNCTIONAL OUTCOME MEASURES  Results Comments  BERG    DGI    FGA    TUG    5TSTS    6 Minute Walk Test    10 Meter Gait Speed    (blank = not tested)   TODAY'S TREATMENT  Deferred   PATIENT EDUCATION:  Education details: Plan of care Person educated: Patient Education method: Explanation Education comprehension: verbalized understanding   HOME EXERCISE PROGRAM:  None currently   ASSESSMENT: CLINICAL IMPRESSION: Patient is a 85 y.o. female who was seen today for physical therapy evaluation and treatment for dizziness and unsteadiness. Pt took an extended amount of time to complete paperwork so evaluation was limited. Additional balance testing will be performed at first follow-up visit.  OBJECTIVE IMPAIRMENTS: decreased  balance and dizziness.   ACTIVITY LIMITATIONS: bending, standing, squatting, and transfers  PARTICIPATION LIMITATIONS: cleaning and community activity  PERSONAL FACTORS: Age, Past/current experiences, Time since onset of injury/illness/exacerbation, and 3+ comorbidities: peripheral neuropathy, hypothyroidism, OA are also affecting patient's functional outcome.   REHAB POTENTIAL: Good  CLINICAL DECISION MAKING: Unstable/unpredictable  EVALUATION COMPLEXITY: High   GOALS:  SHORT TERM GOALS: Target date: 12/24/2024  Pt will be independent with HEP for dizziness in order to decrease symptoms, improve balance,decrease fall  risk, and improve function at home. Baseline: Goal status: INITIAL   LONG TERM GOALS: Target date: 01/21/2025  Pt will improve BERG by at least 3 points in order to demonstrate clinically significant improvement in balance.   Baseline: To be completed Goal status: INITIAL  2.  Pt will decrease DHI score by at least 18 points in order to demonstrate clinically significant reduction in disability related to dizziness.  Baseline: 50/100 Goal status: INITIAL  3.  Pt will improve ABC by at least 13% in order to demonstrate clinically significant improvement in balance confidence.      Baseline: 51.3% Goal status: INITIAL   PLAN: PT FREQUENCY: 1-2x/week  PT DURATION: 8 weeks  PLANNED INTERVENTIONS: Therapeutic exercises, Therapeutic activity, Neuromuscular re-education, Balance training, Gait training, Patient/Family education, Self Care, Joint mobilization, Joint manipulation, Vestibular training, Canalith repositioning, Orthotic/Fit training, DME instructions, Cryotherapy, Moist heat, Taping, Manual therapy, and Re-evaluation.  PLAN FOR NEXT SESSION: BERG, DGI, mCTSIB, 5TSTS, TUG, initiate balance exercises and issue HEP   [name] PT, DPT, GCS  [name], PT 11/27/2024, 9:58 AM  "

## 2024-11-27 ENCOUNTER — Ambulatory Visit: Payer: HMO | Admitting: Dermatology

## 2024-11-28 ENCOUNTER — Ambulatory Visit

## 2024-12-03 ENCOUNTER — Ambulatory Visit

## 2024-12-05 ENCOUNTER — Ambulatory Visit

## 2024-12-07 ENCOUNTER — Other Ambulatory Visit: Payer: Self-pay | Admitting: Cardiovascular Disease

## 2024-12-07 DIAGNOSIS — I493 Ventricular premature depolarization: Secondary | ICD-10-CM

## 2024-12-10 ENCOUNTER — Ambulatory Visit

## 2024-12-12 ENCOUNTER — Ambulatory Visit

## 2024-12-17 ENCOUNTER — Ambulatory Visit

## 2024-12-19 ENCOUNTER — Ambulatory Visit

## 2024-12-24 ENCOUNTER — Ambulatory Visit

## 2024-12-26 ENCOUNTER — Ambulatory Visit

## 2024-12-31 ENCOUNTER — Ambulatory Visit

## 2025-01-02 ENCOUNTER — Ambulatory Visit

## 2025-01-07 ENCOUNTER — Ambulatory Visit

## 2025-01-09 ENCOUNTER — Ambulatory Visit

## 2025-04-03 ENCOUNTER — Ambulatory Visit: Admitting: Internal Medicine

## 2025-10-01 ENCOUNTER — Ambulatory Visit (INDEPENDENT_AMBULATORY_CARE_PROVIDER_SITE_OTHER): Admitting: Vascular Surgery

## 2025-10-01 ENCOUNTER — Encounter (INDEPENDENT_AMBULATORY_CARE_PROVIDER_SITE_OTHER)
